# Patient Record
Sex: Female | Born: 1955 | Race: White | Hispanic: No | Smoking: Former smoker
Health system: Southern US, Community
[De-identification: ages and names within clinical notes are randomized; demographics above are authoritative.]

## PROBLEM LIST (undated history)

## (undated) DIAGNOSIS — I739 Peripheral vascular disease, unspecified: Secondary | ICD-10-CM

## (undated) DIAGNOSIS — T7840XA Allergy, unspecified, initial encounter: Secondary | ICD-10-CM

## (undated) DIAGNOSIS — L732 Hidradenitis suppurativa: Secondary | ICD-10-CM

## (undated) DIAGNOSIS — E785 Hyperlipidemia, unspecified: Secondary | ICD-10-CM

## (undated) DIAGNOSIS — Z9842 Cataract extraction status, left eye: Secondary | ICD-10-CM

## (undated) DIAGNOSIS — Z860100 Personal history of colon polyps, unspecified: Secondary | ICD-10-CM

## (undated) DIAGNOSIS — E039 Hypothyroidism, unspecified: Secondary | ICD-10-CM

## (undated) DIAGNOSIS — E119 Type 2 diabetes mellitus without complications: Secondary | ICD-10-CM

## (undated) DIAGNOSIS — K5790 Diverticulosis of intestine, part unspecified, without perforation or abscess without bleeding: Secondary | ICD-10-CM

## (undated) DIAGNOSIS — G4700 Insomnia, unspecified: Secondary | ICD-10-CM

## (undated) DIAGNOSIS — E669 Obesity, unspecified: Secondary | ICD-10-CM

## (undated) DIAGNOSIS — N952 Postmenopausal atrophic vaginitis: Secondary | ICD-10-CM

## (undated) DIAGNOSIS — E079 Disorder of thyroid, unspecified: Secondary | ICD-10-CM

## (undated) DIAGNOSIS — J329 Chronic sinusitis, unspecified: Secondary | ICD-10-CM

## (undated) DIAGNOSIS — R519 Headache, unspecified: Secondary | ICD-10-CM

## (undated) DIAGNOSIS — R42 Dizziness and giddiness: Secondary | ICD-10-CM

## (undated) DIAGNOSIS — J309 Allergic rhinitis, unspecified: Secondary | ICD-10-CM

## (undated) DIAGNOSIS — D509 Iron deficiency anemia, unspecified: Secondary | ICD-10-CM

## (undated) DIAGNOSIS — J449 Chronic obstructive pulmonary disease, unspecified: Secondary | ICD-10-CM

## (undated) DIAGNOSIS — I700 Atherosclerosis of aorta: Secondary | ICD-10-CM

## (undated) DIAGNOSIS — E559 Vitamin D deficiency, unspecified: Secondary | ICD-10-CM

## (undated) DIAGNOSIS — I5030 Unspecified diastolic (congestive) heart failure: Secondary | ICD-10-CM

## (undated) DIAGNOSIS — F329 Major depressive disorder, single episode, unspecified: Secondary | ICD-10-CM

## (undated) DIAGNOSIS — R112 Nausea with vomiting, unspecified: Secondary | ICD-10-CM

## (undated) DIAGNOSIS — R0600 Dyspnea, unspecified: Secondary | ICD-10-CM

## (undated) DIAGNOSIS — M1712 Unilateral primary osteoarthritis, left knee: Secondary | ICD-10-CM

## (undated) DIAGNOSIS — J45909 Unspecified asthma, uncomplicated: Secondary | ICD-10-CM

## (undated) DIAGNOSIS — Z9889 Other specified postprocedural states: Secondary | ICD-10-CM

## (undated) DIAGNOSIS — G629 Polyneuropathy, unspecified: Secondary | ICD-10-CM

## (undated) DIAGNOSIS — Z9841 Cataract extraction status, right eye: Secondary | ICD-10-CM

## (undated) DIAGNOSIS — M549 Dorsalgia, unspecified: Secondary | ICD-10-CM

## (undated) DIAGNOSIS — D241 Benign neoplasm of right breast: Secondary | ICD-10-CM

## (undated) DIAGNOSIS — Z9981 Dependence on supplemental oxygen: Secondary | ICD-10-CM

## (undated) DIAGNOSIS — J189 Pneumonia, unspecified organism: Secondary | ICD-10-CM

## (undated) DIAGNOSIS — I10 Essential (primary) hypertension: Secondary | ICD-10-CM

## (undated) DIAGNOSIS — R609 Edema, unspecified: Secondary | ICD-10-CM

## (undated) DIAGNOSIS — I509 Heart failure, unspecified: Secondary | ICD-10-CM

## (undated) DIAGNOSIS — G8929 Other chronic pain: Secondary | ICD-10-CM

## (undated) DIAGNOSIS — G4730 Sleep apnea, unspecified: Secondary | ICD-10-CM

## (undated) DIAGNOSIS — I452 Bifascicular block: Secondary | ICD-10-CM

## (undated) DIAGNOSIS — I499 Cardiac arrhythmia, unspecified: Secondary | ICD-10-CM

## (undated) DIAGNOSIS — G4733 Obstructive sleep apnea (adult) (pediatric): Secondary | ICD-10-CM

## (undated) DIAGNOSIS — F32A Depression, unspecified: Secondary | ICD-10-CM

## (undated) DIAGNOSIS — K589 Irritable bowel syndrome without diarrhea: Secondary | ICD-10-CM

## (undated) DIAGNOSIS — K219 Gastro-esophageal reflux disease without esophagitis: Secondary | ICD-10-CM

## (undated) DIAGNOSIS — G709 Myoneural disorder, unspecified: Secondary | ICD-10-CM

## (undated) HISTORY — PX: CHOLECYSTECTOMY: SHX55

## (undated) HISTORY — PX: HEEL SPUR EXCISION: SHX1733

## (undated) HISTORY — PX: TONSILLECTOMY: SUR1361

## (undated) HISTORY — PX: KNEE SURGERY: SHX244

## (undated) HISTORY — PX: ABDOMINAL HYSTERECTOMY: SHX81

## (undated) HISTORY — PX: AXILLARY HIDRADENITIS EXCISION: SUR522

## (undated) HISTORY — DX: Heart failure, unspecified: I50.9

---

## 2004-07-27 ENCOUNTER — Ambulatory Visit: Payer: Self-pay

## 2005-03-30 ENCOUNTER — Ambulatory Visit: Payer: Self-pay | Admitting: Family Medicine

## 2005-12-20 ENCOUNTER — Ambulatory Visit: Payer: Self-pay

## 2007-05-14 ENCOUNTER — Ambulatory Visit: Payer: Self-pay

## 2008-09-16 ENCOUNTER — Ambulatory Visit: Payer: Self-pay

## 2010-04-19 ENCOUNTER — Ambulatory Visit: Payer: Self-pay

## 2010-05-03 ENCOUNTER — Emergency Department: Payer: Self-pay | Admitting: Emergency Medicine

## 2011-08-06 ENCOUNTER — Emergency Department: Payer: Self-pay | Admitting: *Deleted

## 2011-08-30 ENCOUNTER — Encounter: Payer: Self-pay | Admitting: Family Medicine

## 2011-09-18 ENCOUNTER — Encounter: Payer: Self-pay | Admitting: Family Medicine

## 2011-10-05 ENCOUNTER — Ambulatory Visit: Payer: Self-pay | Admitting: Family Medicine

## 2012-04-15 ENCOUNTER — Ambulatory Visit: Payer: Self-pay | Admitting: Unknown Physician Specialty

## 2012-08-15 ENCOUNTER — Ambulatory Visit: Payer: Self-pay | Admitting: Family Medicine

## 2012-09-16 LAB — CBC WITH DIFFERENTIAL/PLATELET
Basophil #: 0.1 10*3/uL (ref 0.0–0.1)
Basophil %: 0.6 %
Eosinophil #: 0.2 10*3/uL (ref 0.0–0.7)
Eosinophil %: 1.2 %
HGB: 12.6 g/dL (ref 12.0–16.0)
MCH: 25.5 pg — ABNORMAL LOW (ref 26.0–34.0)
MCHC: 32.1 g/dL (ref 32.0–36.0)
MCV: 80 fL (ref 80–100)
Monocyte #: 0.6 x10 3/mm (ref 0.2–0.9)
Monocyte %: 4.2 %
Neutrophil #: 12 10*3/uL — ABNORMAL HIGH (ref 1.4–6.5)
Neutrophil %: 83.1 %
Platelet: 293 10*3/uL (ref 150–440)
RBC: 4.95 10*6/uL (ref 3.80–5.20)
RDW: 16.5 % — ABNORMAL HIGH (ref 11.5–14.5)
WBC: 14.5 10*3/uL — ABNORMAL HIGH (ref 3.6–11.0)

## 2012-09-16 LAB — BASIC METABOLIC PANEL
Creatinine: 1.07 mg/dL (ref 0.60–1.30)
EGFR (African American): >60
EGFR (Non-African Amer.): 58 — ABNORMAL LOW
Sodium: 136 mmol/L (ref 136–145)

## 2012-09-17 ENCOUNTER — Inpatient Hospital Stay: Payer: Self-pay | Admitting: Internal Medicine

## 2012-09-18 LAB — CBC WITH DIFFERENTIAL/PLATELET
Basophil %: 0.5 %
Eosinophil %: 1.2 %
HGB: 12.6 g/dL (ref 12.0–16.0)
Lymphocyte %: 15.9 %
MCHC: 33.4 g/dL (ref 32.0–36.0)
Neutrophil %: 77.5 %
RBC: 4.78 10*6/uL (ref 3.80–5.20)
WBC: 11.4 10*3/uL — ABNORMAL HIGH (ref 3.6–11.0)

## 2012-09-18 LAB — BASIC METABOLIC PANEL
Anion Gap: 11 (ref 7–16)
Calcium, Total: 9.2 mg/dL (ref 8.5–10.1)
Chloride: 92 mmol/L — ABNORMAL LOW (ref 98–107)
Co2: 29 mmol/L (ref 21–32)
EGFR (Non-African Amer.): >60
Glucose: 322 mg/dL — ABNORMAL HIGH (ref 65–99)
Osmolality: 279 (ref 275–301)
Sodium: 132 mmol/L — ABNORMAL LOW (ref 136–145)

## 2012-09-18 LAB — LIPID PANEL
Cholesterol: 107 mg/dL (ref 0–200)
HDL Cholesterol: 29 mg/dL — ABNORMAL LOW (ref 40–60)
Ldl Cholesterol, Calc: 22 mg/dL (ref 0–100)
Triglycerides: 282 mg/dL — ABNORMAL HIGH (ref 0–200)
VLDL Cholesterol, Calc: 56 mg/dL — ABNORMAL HIGH (ref 5–40)

## 2012-09-21 LAB — WOUND CULTURE

## 2013-01-03 ENCOUNTER — Ambulatory Visit: Payer: Self-pay | Admitting: Family Medicine

## 2013-12-09 ENCOUNTER — Ambulatory Visit: Payer: Self-pay | Admitting: Family Medicine

## 2013-12-12 ENCOUNTER — Encounter: Payer: Self-pay | Admitting: Surgery

## 2013-12-16 LAB — WOUND AEROBIC CULTURE

## 2013-12-18 ENCOUNTER — Encounter: Payer: Self-pay | Admitting: Surgery

## 2014-01-17 ENCOUNTER — Encounter: Payer: Self-pay | Admitting: Surgery

## 2014-01-27 ENCOUNTER — Ambulatory Visit: Payer: Self-pay | Admitting: Family Medicine

## 2014-01-28 ENCOUNTER — Encounter: Payer: Self-pay | Admitting: Surgery

## 2014-02-17 ENCOUNTER — Encounter: Payer: Self-pay | Admitting: Surgery

## 2014-05-05 ENCOUNTER — Emergency Department: Payer: Self-pay | Admitting: Student

## 2014-07-10 NOTE — Discharge Summary (Signed)
PATIENT NAME:  [name] [name], [name] [name] MR#:  [id number] DATE OF BIRTH:  [birth year]  DATE OF ADMISSION:  09/17/2012 DATE OF DISCHARGE:  09/19/2012  ADDENDUM   The patient's wound cultures from her left lower abdomen just came back as Streptococcus agalactia group B. The patient has (Dictation Anomaly) rare white blood cells as well as (Dictation Anomaly) rare gram-positive cocci which are group B Streptococcus. The patient would benefit from antibiotic therapy if she still has continuous infection and drainage with Keflex or Augmentin at this point, and if she still continues to have discomfort, I would recommend to initiate those antibiotics for a few more days until drainage subsides. However, the patient's vital signs remain stable, and her white blood cell count on 09/18/2012 was 11.4. It is recommended to follow the patient's white blood cell count and make decisions about reinitiation of antibiotic therapy if needed.   ____________________________ Theodoro Grist, MD rv:gb D: 09/19/2012 15:52:51 ET T: 09/19/2012 23:12:13 ET JOB#: [id number]  cc: Theodoro Grist, MD, <Dictator> Sarah "Sallie" Posey Pronto, MD [place] MD ELECTRONICALLY SIGNED 10/02/2012 6:52

## 2014-07-10 NOTE — Discharge Summary (Signed)
PATIENT NAME:  [name] [name], [name] [name] MR#:  [id number] DATE OF BIRTH:  1955-03-28  DATE OF ADMISSION:  09/17/2012 DATE OF DISCHARGE:  09/19/2012  ADMITTING DIAGNOSES: Cellulitis and abscess.   DISCHARGE DIAGNOSES: 1.  Abdominal wall cellulitis and abscess status post incision and drainage in the past and completed antibiotic therapy, improving. 2.  History of hydradenitis suppurativa.  3.  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  4.  Hypertension, depression, chronic obstructive pulmonary disease and asthma, as well as obesity.   DISCHARGE CONDITION: Stable.   DISCHARGE MEDICATIONS: The patient is to resume her outpatient medications which are: 1.  Naprosyn 500 mg p.o. twice daily as needed.  2.  Cymbalta 20 mg p.o. once daily.  3.  Meclizine 25 mg 3 times daily as needed.  4.  Hydrochlorothiazide/lisinopril 12.5/10 mg once daily.  5.  Klor-Con 10 mEq p.o. twice daily.  6.  Humulin R 9 units twice daily before meals.  7.  Doxepin 25 mg p.o. at bedtime.  8.  Proventil HFA free 2 puffs every 4 hours as needed.  9.  Trazodone 50 mg p.o. 1 to 2 tablets at bedtime as needed.  10.  Omeprazole 40 mg p.o. daily.  11.  Advair Diskus 250/50 one puff twice daily.  12.  Furosemide 40 mg p.o. twice daily.  13.  Levothyroxine 125 mcg 2 tablets once daily.  14.  Albuterol 2.5 mg inhalation solution 3 mL every 4 hours as needed.  15.  Lipitor 20 mg p.o. once daily.  16.  Rhinocort nasal spray 2 sprays once daily.  17.  Acetaminophen/oxycodone 325/5 mg 1 tablet every 6 hours as needed.   DISCHARGE WOUND CARE:  The patient is to keep the bandage dry and applied to the wound.  She is to use only dry bandage on the wound and change it as needed at least once a day.   DISCHARGE DIET: Low salt, low fat, low cholesterol, carbohydrate-controlled. Also, the patient was advised to have low calorie diet and lose weight if possible. Regular consistency.   ACTIVITY LIMITATIONS: As tolerated.    DISCHARGE FOLLOWUP: Appointment with Dr. Rochel Brome in 1 week after discharge, Dr. Denton Lank in 2 days after discharge.   CONSULTANTS: Care management, Dr. Pat Patrick.   RADIOLOGIC STUDIES: None.   HISTORY: The patient is a 59 year old Caucasian female with past medical history significant for history of hydradenitis who presented to the hospital on 09/17/2012 with complaints of abscess of abdominal wall. Please refer to admitting physician's note, by Dr. Tressia Miners.   HOSPITAL COURSE:  On arrival to the hospital, it was felt the patient had cellulitis with draining abscess in suprapubic area and surgical consultation was requested.  The patient was initiated on broad-spectrum antibiotic therapy, as well as blood cultures were taken. The patient was also evaluated by Dr. Pat Patrick who followed her along. He felt that the patient had abdominal wall abscess; however, it appeared to have drained spontaneously.  She underwent drainage procedure in his office in the past year. Abscess drained 2 days ago with burst of bloody and foul-smelling fluid. Since Dr. Rochel Brome was not available at that time, Dr. Pat Patrick saw the patient in consultation. He felt that she has hydradenitis suppurativa with multiple abscesses in both axillary areas and just completed antibiotic therapy with doxycycline.  On exam, he felt that the cellulitis was shrinking in the area, in the left lower quadrant and groin.  Bloody drainage was on underwear, but he felt that there  was no evidence of un-drained infection. He felt that there was no surgical indication at this time and if questions arise he recommended to re-consult Dr. Rochel Brome since he has been involved in the patient's care previously.  The patient was managed on antibiotic therapy for a few days. She had wound cultures taken from left lower abdomen which showed light group of Staphylococcus agalactia, group B. However, since the patient was draining spontaneously and completed  antibiotic course, we did not feel that she needs to continue antibiotic therapy at this point.  She is, however, to follow up with her primary physician, Dr. Posey Pronto, as well as Dr. Rochel Brome in the next few days after discharge or if any other problems arise.   On day of discharge, the patient's vital signs are stable with temperature 97.9, pulse was 80s to 90s, respiratory rate was 20, blood pressure 071 to 219 systolic and 75O to 83G diastolic and O2 sats were 91 to 94% on room air at rest.   TIME SPENT: 40 minutes. ____________________________ Theodoro Grist, MD rv:sb D: 09/19/2012 15:49:07 ET T: 09/19/2012 16:35:51 ET JOB#: [id number]  cc: Theodoro Grist, MD, <Dictator> J. Rochel Brome, MD Sarah "Sallie" Posey Pronto, MD [place] MD ELECTRONICALLY SIGNED 09/26/2012 11:38

## 2014-07-10 NOTE — H&P (Signed)
PATIENT NAME:  [name] [name], DRZEWIECKI MR#:  [id number] DATE OF BIRTH:  1956-01-15  DATE OF ADMISSION:  09/16/2012  PRIMARY CARE PHYSICIAN: Dr. Denton Lank.   REFERRING PHYSICIAN: Ferman Hamming.  CHIEF COMPLAINT: Increased pain, swelling, redness in the abdomen.   HISTORY OF PRESENT ILLNESS: Ms. Klenke is a 59 year old, morbidly obese, white female with history of diabetes mellitus insulin dependent, hypertension, hyperlipidemia. Presented to the Emergency Department with increased redness and swelling on the lower part of the abdomen. This started about a week back and started to drain. The patient had multiple episodes of cellulitis and abscess at various parts of the body which were incised and drained in the past. The patient received 1 dose of vancomycin in the Emergency Department. The patient is also found to have elevated white blood cell count of 14,000. Even though afebrile, the patient was tachycardic.   PAST MEDICAL HISTORY:  1. Hypothyroidism.  2. Gastroesophageal reflux disease.  3. Obstructive sleep apnea.  4. Insomnia.  5. Hypertension.  6. Diabetes mellitus, insulin dependent.  7. Seasonal allergies.   PAST SURGICAL HISTORY: Hysterectomy.   ALLERGIES: CODEINE.   HOME MEDICATIONS:  1. Trazodone 50 mg 1 to 2 tablets at bedtime.  2. Rhinocort 2 sprays nasal once a day.  3. Proventil 2 puffs every 4 hours as needed.  4. Omeprazole 40 mg daily.  5. Naprosyn 500 mg 2 times a day.  6.    7. Lipitor 20 mg once a day.  8. Levothyroxine 250 mcg once a day.  9. Klor-Con 10 mEq 2 times a day.  10. Hydrochlorothiazide/lisinopril 12.5/10 mg once a day.  11. Humulin R 90 units subcutaneous 2 times a day.  12. Lasix 40 mg 2 times a day.  13. Doxepin 25 mg once a day.  14. Cymbalta 20 mg once a day.  15. Advair Diskus 1 puff 2 times a day.   SOCIAL HISTORY: No history of smoking, drinking alcohol or using illicit drugs. Disabled.   FAMILY HISTORY: Mother died of liver cancer.  Father died at a massive heart attack. One of his sisters died of heart attack.   REVIEW OF SYSTEMS:  CONSTITUTIONAL: Denies having any generalized weakness.  EYES: No change in vision.  ENT: No change in hearing. No tinnitus.  RESPIRATORY: No cough or shortness of breath.  CARDIOVASCULAR: No chest pain, palpitations. Uses Lasix for lower extremity swelling.   GENITOURINARY: No dysuria or hematochezia.  SKIN: Has multiple skin abscesses.  MUSCULOSKELETAL: Has chronic pain in multiple joints.  NEUROLOGIC: No weakness or numbness in any part of the body.  PSYCHIATRIC: Has depression.   PHYSICAL EXAMINATION:  GENERAL: This is a morbidly obese female lying down in the bed, not in distress.  VITAL SIGNS: Temperature 98, pulse 103, blood pressure 117/54, respiratory rate of 18, oxygen saturations 95% on room air.  HEENT: Head normocephalic, atraumatic. There is no scleral icterus. Conjunctivae normal. Pupils equal and react to light. Extraocular movements are intact. Mucous membranes moist. No pharyngeal erythema.  NECK: Supple. No lymphadenopathy. No JVD. No carotid bruit. No thyromegaly.  CHEST: Has no focal tenderness.  LUNGS: Bilaterally clear to auscultation.  HEART: S1, S2, regular, tachycardia. No pedal edema. Pulses 2+.  ABDOMEN: Obese. Bowel sounds present. In the left lower quadrant under the pannus, has a large area of cellulitis with open area of abscess. No rebound, guarding or rebound tenderness.  MUSCULOSKELETAL: Good range of motion in all of the extremities.  NEUROLOGIC: The patient is alert, oriented  to place, person and time. Cranial nerves II through XII intact. Motor 5/5 in upper and lower extremities.   LABORATORY DATA: CMP: Glucose 356. The rest of all of the values are within normal limits. CBC: WBC of 14.5, hemoglobin 12.6. The rest of all of the values are within normal limits.   ASSESSMENT AND PLAN: Ms. Heffelfinger is a 59 year old female who comes to the Emergency  Department with cellulitis and abscess of the abdominal wall.  1. Cellulitis and abscess:  open area, started the patient on vancomycin. If the patient continues to have persistence of the induration, would strongly consider getting an ultrasound or CT of the abdomen to rule out underlying abscess.   2. Diabetes mellitus, poorly controlled: This could be from the infection. The patient states usually blood sugars are well controlled. Continue the current dose. Will also obtain hemoglobin A1c.  3. Hypertension: Currently well controlled. Continue home medications of lisinopril/hydrochlorothiazide.  4. Morbid obesity with a body mass index of 50: Discussed with the patient. The patient is planning to go for gastric bypass surgery.  5. Keep the patient on deep vein thrombosis prophylaxis with Lovenox.   TIME SPENT: 45 minutes.   ____________________________ Monica Becton, MD pv:gb D: 09/17/2012 01:05:38 ET T: 09/17/2012 01:45:09 ET JOB#: [id number]  cc: Monica Becton, MD, <Dictator> Sarah "Sallie" Posey Pronto, MD Monica Becton MD ELECTRONICALLY SIGNED 10/01/2012 0:37

## 2014-07-10 NOTE — Consult Note (Signed)
PATIENT NAME:  [name] [name] MR#:  [id number] DATE OF BIRTH:  [birth year]  DATE OF CONSULTATION:  09/17/2012  REFERRING PHYSICIAN:   CONSULTING PHYSICIAN:  Micheline Maze, MD  PRIMARY CARE PHYSICIAN:  Zannie Kehr, MD  ADMITTING PHYSICIAN:  Prime doc.   CHIEF COMPLAINT:  Abscess of abdominal wall.   BRIEF HISTORY:  [name] [name] is a 59 year old woman admitted to the hospital with an abdominal wall cellulitis and a spontaneously draining abscess. She had been symptomatic for approximately 48 hours at the time of admission. She has had multiple previous abscesses primarily on her neck and axilla. She underwent an excision and drainage procedure by Dr. Rochel Brome, her primary surgeon, within the last 6 months with an abscess on her neck and she has had a previous ventral hernia repair in his hands. Dr. Tamala Julian was unavailable today due to his surgical schedule and I was asked to see the patient in his stead.   She has been treated with a recent course of doxycycline in an effort to suppress her ongoing infections. With her morbid obesity and insulin-dependent diabetes, she is at significant risk for the development of these problems. She carries also the diagnosis of hidradenitis suppurativa. She was admitted with an elevated white blood cell count. No imaging studies have been performed. The surgical service was consulted to consider possible incision and drainage.   PAST MEDICAL HISTORY:  Significant for severe chronic obstructive lung disease, morbid obesity, insulin-dependent diabetes, obstructive sleep apnea and gastroesophageal reflux disease.   MEDICATIONS:  Well outlined in her admission note.   SOCIAL HISTORY:  She is not a cigarette smoker and currently does not use alcohol. She is being evaluated for potential gastric bypass surgery for morbid obesity.   PHYSICAL EXAMINATION: GENERAL: She is an alert pleasant woman lying in bed in no distress. She did say she recently had a hot  flash and is a little diaphoretic, but does not appear to be in any distress.  VITAL SIGNS: Her blood pressure is 108/69 and heart rate is 95 and regular. She is afebrile.  HEENT: Open draining sore on the posterior portion of her left neck. There is some cellulitis surrounding it at the site of her previous incision and drainage per Dr. Tamala Julian. She has no pupillary abnormalities. No scleral icterus. Her neck is otherwise supple without any obvious tenderness and she has a midline trachea.  CHEST: Clear with no adventitious sounds and normal pulmonary excursion.  CARDIAC: No murmurs or gallops. She seems to be in normal sinus rhythm.  ABDOMEN: Soft, nontender with no organomegaly. No rebound and no guarding. She has a large draining wound on her left lower quadrant with some surrounding cellulitis. She does appear to have drained spontaneously and does not appear to have any further undrained pus.  EXTREMITIES: There was some mild edema. Full range of motion.  PSYCHIATRIC: Normal orientation and normal affect.   IMPRESSION:  This woman appears to have a significant skin infection problem characterized by multiple skin infections including the one on her neck and abdomen and her recent history of hidradenitis suppurativa. She does not have anything at the present time that she needs to have drained. I agree with antibiotic therapy and followup as an outpatient. We would recommend she continue her followup with Dr. Tamala Julian since she has an established relationship with his practice. Should she require any further assistance in the hospital, I would also recommend consulting Dr. Tamala Julian.    ____________________________ Deidre Ala  Buck Mam III, MD rle:si D: 09/17/2012 15:13:00 ET T: 09/17/2012 15:58:33 ET JOB#: [id number]  cc: Rodena Goldmann III, MD, <Dictator> Rodena Goldmann MD ELECTRONICALLY SIGNED 09/20/2012 8:25

## 2014-07-12 ENCOUNTER — Emergency Department: Admit: 2014-07-12 | Disposition: A | Discharge status: Home / Self Care | Payer: Self-pay | Admitting: Student

## 2014-07-12 LAB — COMPREHENSIVE METABOLIC PANEL
ALBUMIN: 3.6 g/dL
ANION GAP: 13 (ref 7–16)
Alkaline Phosphatase: 109 U/L
BUN: 18 mg/dL
Bilirubin,Total: 0.7 mg/dL
CHLORIDE: 94 mmol/L — AB
Calcium, Total: 8.9 mg/dL
Co2: 26 mmol/L
Creatinine: 0.95 mg/dL
EGFR (Non-African Amer.): >60
GLUCOSE: 287 mg/dL — AB
Potassium: 4.4 mmol/L
SGOT(AST): 21 U/L
SGPT (ALT): 15 U/L
Sodium: 133 mmol/L — ABNORMAL LOW
Total Protein: 7 g/dL

## 2014-07-12 LAB — CBC
HCT: 41.8 % (ref 35.0–47.0)
HGB: 13.5 g/dL (ref 12.0–16.0)
MCH: 26.1 pg (ref 26.0–34.0)
MCHC: 32.2 g/dL (ref 32.0–36.0)
MCV: 81 fL (ref 80–100)
Platelet: 272 10*3/uL (ref 150–440)
RBC: 5.15 10*6/uL (ref 3.80–5.20)
RDW: 15.5 % — AB (ref 11.5–14.5)
WBC: 13.9 10*3/uL — ABNORMAL HIGH (ref 3.6–11.0)

## 2014-09-15 ENCOUNTER — Encounter: Payer: Self-pay | Admitting: Urgent Care

## 2014-09-15 ENCOUNTER — Emergency Department: Payer: Medicare Other

## 2014-09-15 ENCOUNTER — Emergency Department
Admission: EM | Admit: 2014-09-15 | Discharge: 2014-09-16 | Disposition: A | Discharge status: Home / Self Care | Payer: Medicare Other | Attending: Emergency Medicine | Admitting: Emergency Medicine

## 2014-09-15 DIAGNOSIS — Y93E1 Activity, personal bathing and showering: Secondary | ICD-10-CM | POA: Insufficient documentation

## 2014-09-15 DIAGNOSIS — S99922A Unspecified injury of left foot, initial encounter: Secondary | ICD-10-CM | POA: Diagnosis present

## 2014-09-15 DIAGNOSIS — Y9289 Other specified places as the place of occurrence of the external cause: Secondary | ICD-10-CM | POA: Insufficient documentation

## 2014-09-15 DIAGNOSIS — W182XXA Fall in (into) shower or empty bathtub, initial encounter: Secondary | ICD-10-CM | POA: Diagnosis not present

## 2014-09-15 DIAGNOSIS — L03116 Cellulitis of left lower limb: Secondary | ICD-10-CM | POA: Insufficient documentation

## 2014-09-15 DIAGNOSIS — Y998 Other external cause status: Secondary | ICD-10-CM | POA: Insufficient documentation

## 2014-09-15 DIAGNOSIS — I10 Essential (primary) hypertension: Secondary | ICD-10-CM | POA: Diagnosis not present

## 2014-09-15 DIAGNOSIS — Z23 Encounter for immunization: Secondary | ICD-10-CM | POA: Insufficient documentation

## 2014-09-15 DIAGNOSIS — S90411A Abrasion, right great toe, initial encounter: Secondary | ICD-10-CM | POA: Insufficient documentation

## 2014-09-15 DIAGNOSIS — Z87891 Personal history of nicotine dependence: Secondary | ICD-10-CM | POA: Diagnosis not present

## 2014-09-15 DIAGNOSIS — E119 Type 2 diabetes mellitus without complications: Secondary | ICD-10-CM | POA: Insufficient documentation

## 2014-09-15 HISTORY — DX: Edema, unspecified: R60.9

## 2014-09-15 HISTORY — DX: Vitamin D deficiency, unspecified: E55.9

## 2014-09-15 HISTORY — DX: Gastro-esophageal reflux disease without esophagitis: K21.9

## 2014-09-15 HISTORY — DX: Unilateral primary osteoarthritis, left knee: M17.12

## 2014-09-15 HISTORY — DX: Allergic rhinitis, unspecified: J30.9

## 2014-09-15 HISTORY — DX: Personal history of colonic polyps: Z86.010

## 2014-09-15 HISTORY — DX: Sleep apnea, unspecified: G47.30

## 2014-09-15 HISTORY — DX: Obesity, unspecified: E66.9

## 2014-09-15 HISTORY — DX: Depression, unspecified: F32.A

## 2014-09-15 HISTORY — DX: Personal history of colon polyps, unspecified: Z86.0100

## 2014-09-15 HISTORY — DX: Irritable bowel syndrome, unspecified: K58.9

## 2014-09-15 HISTORY — DX: Essential (primary) hypertension: I10

## 2014-09-15 HISTORY — DX: Chronic obstructive pulmonary disease, unspecified: J44.9

## 2014-09-15 HISTORY — DX: Chronic sinusitis, unspecified: J32.9

## 2014-09-15 HISTORY — DX: Insomnia, unspecified: G47.00

## 2014-09-15 HISTORY — DX: Dizziness and giddiness: R42

## 2014-09-15 HISTORY — DX: Disorder of thyroid, unspecified: E07.9

## 2014-09-15 HISTORY — DX: Major depressive disorder, single episode, unspecified: F32.9

## 2014-09-15 HISTORY — DX: Postmenopausal atrophic vaginitis: N95.2

## 2014-09-15 HISTORY — DX: Hidradenitis suppurativa: L73.2

## 2014-09-15 HISTORY — DX: Allergy, unspecified, initial encounter: T78.40XA

## 2014-09-15 HISTORY — DX: Type 2 diabetes mellitus without complications: E11.9

## 2014-09-15 LAB — BASIC METABOLIC PANEL
ANION GAP: 13 (ref 5–15)
BUN: 15 mg/dL (ref 6–20)
CALCIUM: 9.1 mg/dL (ref 8.9–10.3)
CHLORIDE: 96 mmol/L — AB (ref 101–111)
CO2: 28 mmol/L (ref 22–32)
CREATININE: 0.99 mg/dL (ref 0.44–1.00)
GFR calc Af Amer: >60 mL/min (ref >60)
GFR calc non Af Amer: >60 mL/min (ref >60)
Glucose, Bld: 171 mg/dL — ABNORMAL HIGH (ref 65–99)
Potassium: 3.7 mmol/L (ref 3.5–5.1)
Sodium: 137 mmol/L (ref 135–145)

## 2014-09-15 LAB — CBC
HCT: 41.2 % (ref 35.0–47.0)
HEMOGLOBIN: 13.1 g/dL (ref 12.0–16.0)
MCH: 25.7 pg — AB (ref 26.0–34.0)
MCHC: 31.7 g/dL — ABNORMAL LOW (ref 32.0–36.0)
MCV: 81.1 fL (ref 80.0–100.0)
Platelets: 300 10*3/uL (ref 150–440)
RBC: 5.09 MIL/uL (ref 3.80–5.20)
RDW: 15.3 % — ABNORMAL HIGH (ref 11.5–14.5)
WBC: 13 10*3/uL — ABNORMAL HIGH (ref 3.6–11.0)

## 2014-09-15 MED ORDER — SULFAMETHOXAZOLE-TRIMETHOPRIM 800-160 MG PO TABS: 2.0000 | ORAL_TABLET | Freq: Two times a day (BID) | ORAL | Status: DC

## 2014-09-15 MED ORDER — CEPHALEXIN 500 MG PO CAPS: 500.0000 mg | ORAL_CAPSULE | Freq: Four times a day (QID) | ORAL | Status: AC

## 2014-09-15 MED ORDER — TETANUS-DIPHTHERIA TOXOIDS TD 5-2 LFU IM INJ
INJECTION | INTRAMUSCULAR | Status: AC
  Administered 2014-09-15: 0.5 mL via INTRAMUSCULAR
  Filled 2014-09-15: qty 0.5

## 2014-09-15 MED ORDER — TETANUS-DIPHTHERIA TOXOIDS TD 5-2 LFU IM INJ
0.5000 mL | INJECTION | Freq: Once | INTRAMUSCULAR | Status: AC
  Administered 2014-09-15: 0.5 mL via INTRAMUSCULAR

## 2014-09-15 MED ORDER — SODIUM CHLORIDE 0.9 % IV BOLUS (SEPSIS)
500.0000 mL | Freq: Once | INTRAVENOUS | Status: AC
  Administered 2014-09-15: 500 mL via INTRAVENOUS

## 2014-09-15 MED ORDER — VANCOMYCIN HCL 10 G IV SOLR
2000.0000 mg | Freq: Once | INTRAVENOUS | Status: AC
  Administered 2014-09-15: 2000 mg via INTRAVENOUS
  Filled 2014-09-15: qty 2000

## 2014-09-15 NOTE — ED Notes (Signed)
Pharmacy called for antibiotic.

## 2014-09-15 NOTE — Discharge Instructions (Signed)

## 2014-09-15 NOTE — ED Notes (Signed)
Schaevitz,MD consulted. MD made aware of presenting complaints and triage assessment. MD with VORB for CBC, BMP, and plain films of foot. Orders to be entered and carried by this RN.

## 2014-09-15 NOTE — ED Notes (Signed)
Pt presents to ED with scrape to left great toe from falling out of bathtub last week. Wound does not appear to be healing. Pt states she now has left ankle swelling with no known injury for the past couple of days. [name]

## 2014-09-15 NOTE — ED Notes (Signed)
Patient presents with reports of a fall. Reports that she fell in the shower "over a week ago" - continues with non-specific headache. Patient also cut LEFT great toe on shower stopper - patient has T2DM - area slow to heal and now has ankle swelling and is draining yellow purulent drainage.

## 2014-09-15 NOTE — ED Provider Notes (Addendum)
[facility] Emergency Department Provider Note  ____________________________________________  Time seen: Approximately 950 PM  I have reviewed the triage vital signs and the nursing notes.   HISTORY  Chief Complaint Foot Injury    HPI [name] [name] is a 59 y.o. female with a history of diabetes and hypertension who presents today with left-sided foot and ankle swelling. She says that one week ago she slipped and fell on the shower scraping her left toe and since she has had drainage of pus and now with increased swelling and pain to the foot and ankle. She denies any fevers. She said that she did hit her head when she fell and had a lump on the back of her head but the lump in her headache have now decreased. She is not claiming of any focal numbness or weakness. Was on a course of doxycycline for 6-8 weeks 3 weeks ago but is no longer taking the medication. Does not know when her last tetanus shot was.   Past Medical History  Diagnosis Date  . Diabetes mellitus without complication   . Thyroid disease   . COPD (chronic obstructive pulmonary disease)     There are no active problems to display for this patient.   Past Surgical History  Procedure Laterality Date  . Abdominal hysterectomy    . Cesarean section      x 2  . Cholecystectomy    . Tonsillectomy      No current outpatient prescriptions on file.  Allergies Codeine  No family history on file.  Social History History  Substance Use Topics  . Smoking status: Former Research scientist (life sciences)  . Smokeless tobacco: Not on file  . Alcohol Use: No    Review of Systems Constitutional: No fever/chills Eyes: No visual changes. ENT: No sore throat. Cardiovascular: Denies chest pain. Respiratory: Denies shortness of breath. Gastrointestinal: No abdominal pain.  No nausea, no vomiting.  No diarrhea.  No constipation. Genitourinary: Negative for dysuria. Musculoskeletal: Negative for back pain. Skin:  Negative for rash. Neurological: Negative for headaches, focal weakness or numbness.  10-point ROS otherwise negative.  ____________________________________________   PHYSICAL EXAM:  VITAL SIGNS: ED Triage Vitals  Enc Vitals Group     BP 09/15/14 2049 104/49 mmHg     Pulse Rate 09/15/14 2049 94     Resp 09/15/14 2049 18     Temp 09/15/14 2049 98 F (36.7 C)     Temp Source 09/15/14 2049 Oral     SpO2 09/15/14 2049 92 %     Weight 09/15/14 2049 280 lb (127.007 kg)     Height 09/15/14 2049 5\' 5"  (1.651 m)     Head Cir --      Peak Flow --      Pain Score 09/15/14 2050 6     Pain Loc --      Pain Edu? --      Excl. in [place]? --     Constitutional: Alert and oriented. Well appearing and in no acute distress. Eyes: Conjunctivae are normal. PERRL. EOMI. Head: Atraumatic. Nose: No congestion/rhinnorhea. Mouth/Throat: Mucous membranes are moist.  Oropharynx non-erythematous. Neck: No stridor.   Cardiovascular: Normal rate, regular rhythm. Grossly normal heart sounds.  Good peripheral circulation. Dorsalis pedis pulses present and equal bilaterally. Respiratory: Normal respiratory effort.  No retractions. Lungs CTAB. Gastrointestinal: Soft and nontender. No distention. No abdominal bruits. No CVA tenderness. Musculoskeletal: No lower extremity tenderness nor edema.  No joint effusions. Neurologic:  Normal speech and  language. No gross focal neurologic deficits are appreciated. Speech is normal. No gait instability. Sensate to the bilateral lower extremities and feet. Skin:  Skin is warm, dry with a 1 x 3 cm superficial abrasion to the right lateral and dorsum of the great toe. There is no drainage. However, there is a half a centimeter wheal of erythema around the abrasion. There is mild edema to the bilateral lower extremities with slightly more to the left foot and ankle extending just above the ankle.  No weeping pus to the foot ankle or leg. No induration. No rash noted.  No entry  wound or foreign body visualized or palpated. There is no swelling in the area of the foreign body seen on x-ray or induration pus. I feel this is likely external, possibly on the x-ray plate.  Psychiatric: Mood and affect are normal. Speech and behavior are normal.  ____________________________________________   LABS (all labs ordered are listed, but only abnormal results are displayed)  Labs Reviewed  CBC - Abnormal; Notable for the following:    WBC 13.0 (*)    MCH 25.7 (*)    MCHC 31.7 (*)    RDW 15.3 (*)    All other components within normal limits  BASIC METABOLIC PANEL - Abnormal; Notable for the following:    Chloride 96 (*)    Glucose, Bld 171 (*)    All other components within normal limits  CULTURE, BLOOD (SINGLE)   ____________________________________________  EKG   ____________________________________________  RADIOLOGY  X-rays of the foot with diffuse edema. Linear 7 mm metallic foreign body along the plantar surface of the second digit. I personally reviewed the images. ____________________________________________   PROCEDURES   ____________________________________________   INITIAL IMPRESSION / ASSESSMENT AND PLAN / ED COURSE  Pertinent labs & imaging results that were available during my care of the patient were reviewed by me and considered in my medical decision making (see chart for details).  ----------------------------------------- 11:23 PM on 09/15/2014 -----------------------------------------  To give a loading dose of IV vancomycin. We'll discharge with by mouth antibiotics. Pressures improving with fluids. Signed out to Dr. Dahlia Client will follow patient's pressure of the next several hours as she receives her vancomycin and disposition. Do not see evidence of any foreign body in the tissue. Was not part of the acute injury last week. ____________________________________________   FINAL CLINICAL IMPRESSION(S) / ED DIAGNOSES  Acute left  foot and left great toe cellulitis. Initial visit.    Orbie Pyo, MD 09/15/14 2325  Labs at baseline. Patient with chronic leukocytosis.  Orbie Pyo, MD 09/15/14 [phone]

## 2014-09-16 ENCOUNTER — Encounter: Payer: Self-pay | Admitting: Emergency Medicine

## 2014-09-16 DIAGNOSIS — L03116 Cellulitis of left lower limb: Secondary | ICD-10-CM | POA: Diagnosis not present

## 2014-09-16 MED ORDER — BACITRACIN ZINC 500 UNIT/GM EX OINT
TOPICAL_OINTMENT | CUTANEOUS | Status: AC
  Administered 2014-09-16: 1
  Filled 2014-09-16: qty 0.9

## 2014-09-16 MED ORDER — FLUCONAZOLE 150 MG PO TABS: 150.0000 mg | ORAL_TABLET | Freq: Every day | ORAL | Status: DC

## 2014-09-16 NOTE — ED Notes (Signed)

## 2014-09-16 NOTE — ED Notes (Signed)
Patient requesting something else for antibiotics. MD in room to speak with patient.

## 2014-09-16 NOTE — ED Provider Notes (Signed)
-----------------------------------------   1:37 AM on 09/16/2014 -----------------------------------------  Assuming care from Dr. Clearnce Hasten.  In short, [name] [name] is a 59 y.o. female with a chief complaint of Foot Injury .  Refer to the original H&P for additional details.  The current plan of care is to reassess the patient after she receives her fluids and antibiotics.   The patient completed her fluids and her antibiotics without any difficulty. She'll be discharged home to follow-up with her primary care physician.  Loney Hering, MD 09/16/14 [phone]

## 2014-09-20 LAB — CULTURE, BLOOD (SINGLE): Culture: NO GROWTH

## 2014-12-11 ENCOUNTER — Ambulatory Visit: Payer: Medicare Other | Admitting: Surgery

## 2014-12-13 ENCOUNTER — Emergency Department
Admission: EM | Admit: 2014-12-13 | Discharge: 2014-12-13 | Disposition: A | Discharge status: Home / Self Care | Payer: Medicare Other | Attending: Emergency Medicine | Admitting: Emergency Medicine

## 2014-12-13 DIAGNOSIS — Z87891 Personal history of nicotine dependence: Secondary | ICD-10-CM | POA: Diagnosis not present

## 2014-12-13 DIAGNOSIS — Z7951 Long term (current) use of inhaled steroids: Secondary | ICD-10-CM | POA: Insufficient documentation

## 2014-12-13 DIAGNOSIS — Z794 Long term (current) use of insulin: Secondary | ICD-10-CM | POA: Insufficient documentation

## 2014-12-13 DIAGNOSIS — Z792 Long term (current) use of antibiotics: Secondary | ICD-10-CM | POA: Diagnosis not present

## 2014-12-13 DIAGNOSIS — Z79899 Other long term (current) drug therapy: Secondary | ICD-10-CM | POA: Diagnosis not present

## 2014-12-13 DIAGNOSIS — L0291 Cutaneous abscess, unspecified: Secondary | ICD-10-CM

## 2014-12-13 DIAGNOSIS — E119 Type 2 diabetes mellitus without complications: Secondary | ICD-10-CM | POA: Insufficient documentation

## 2014-12-13 DIAGNOSIS — L0211 Cutaneous abscess of neck: Secondary | ICD-10-CM | POA: Diagnosis not present

## 2014-12-13 DIAGNOSIS — I10 Essential (primary) hypertension: Secondary | ICD-10-CM | POA: Diagnosis not present

## 2014-12-13 DIAGNOSIS — L02214 Cutaneous abscess of groin: Secondary | ICD-10-CM | POA: Diagnosis not present

## 2014-12-13 LAB — GLUCOSE, CAPILLARY: Glucose-Capillary: 218 mg/dL — ABNORMAL HIGH (ref 65–99)

## 2014-12-13 MED ORDER — VANCOMYCIN HCL IN DEXTROSE 1-5 GM/200ML-% IV SOLN
INTRAVENOUS | Status: AC
  Administered 2014-12-13: 1 g
  Filled 2014-12-13: qty 200

## 2014-12-13 MED ORDER — VANCOMYCIN HCL 10 G IV SOLR: 1.0000 g | Freq: Once | INTRAVENOUS | Status: DC

## 2014-12-13 MED ORDER — LIDOCAINE-EPINEPHRINE (PF) 1 %-1:200000 IJ SOLN
INTRAMUSCULAR | Status: AC
  Filled 2014-12-13: qty 30

## 2014-12-13 MED ORDER — CEPHALEXIN 500 MG PO CAPS: 500.0000 mg | ORAL_CAPSULE | Freq: Four times a day (QID) | ORAL | Status: AC

## 2014-12-13 MED ORDER — LIDOCAINE-EPINEPHRINE 2 %-1:100000 IJ SOLN: 30.0000 mL | Freq: Once | INTRAMUSCULAR | Status: DC

## 2014-12-13 MED ORDER — DOXYCYCLINE HYCLATE 100 MG PO TBEC: 100.0000 mg | DELAYED_RELEASE_TABLET | Freq: Two times a day (BID) | ORAL | Status: AC

## 2014-12-13 NOTE — ED Notes (Signed)
Pt says she's had abscesses to bilateral axilla for about 5 years, sores present now; for 2 years she's had sores on her neck; about 10 days she noticed an area to her left groin that is now open and draining; pt says she has been diagnosed with hidradenitis suppurativa but does not take medications for this; afebrile

## 2014-12-13 NOTE — ED Provider Notes (Signed)
Time Seen: Approximately ----------------------------------------- 10:19 PM on 12/13/2014 -----------------------------------------    I have reviewed the triage notes  Chief Complaint: Abscess   History of Present Illness: [name] [name] is a 59 y.o. female who states a long history of frequent abscesses. Patient has been in to see her primary physician and has been advised to follow up in the wound clinic. Her most recent lesion seemed to be in the left groin and also in the back of her neck. Patient's currently not on any oral antibiotics. She denies any fever at home and is not aware if her blood sugars been elevated. She states the lesion in her left groin has had some active drainage. She denies any abdominal or chest discomfort.   Past Medical History  Diagnosis Date  . Diabetes mellitus without complication   . Thyroid disease   . COPD (chronic obstructive pulmonary disease)   . Vertigo   . Edema   . Allergic rhinitis   . Degenerative joint disease of knee, left   . Hidradenitis   . IBS (irritable bowel syndrome)   . Depression   . Vitamin D deficiency   . Insomnia   . Sleep apnea   . History of colonic polyps   . Sinusitis, chronic   . Hypertension   . Obesity   . Vaginitis, atrophic   . GERD (gastroesophageal reflux disease)   . Allergy     There are no active problems to display for this patient.   Past Surgical History  Procedure Laterality Date  . Abdominal hysterectomy    . Cesarean section      x 2  . Cholecystectomy    . Tonsillectomy      Past Surgical History  Procedure Laterality Date  . Abdominal hysterectomy    . Cesarean section      x 2  . Cholecystectomy    . Tonsillectomy      Current Outpatient Rx  Name  Route  Sig  Dispense  Refill  . albuterol (PROVENTIL HFA;VENTOLIN HFA) 108 (90 BASE) MCG/ACT inhaler   Inhalation   Inhale 2 puffs into the lungs every 4 (four) hours as needed for wheezing or shortness of breath.          [name] albuterol (PROVENTIL) (2.5 MG/3ML) 0.083% nebulizer solution   Nebulization   Take 2.5 mg by nebulization every 4 (four) hours as needed for wheezing or shortness of breath.         [name] atorvastatin (LIPITOR) 20 MG tablet   Oral   Take 20 mg by mouth daily at 6 PM.         . cephALEXin (KEFLEX) 500 MG capsule   Oral   Take 1 capsule (500 mg total) by mouth 4 (four) times daily.   40 capsule   0   . cholestyramine (QUESTRAN) 4 GM/DOSE powder   Oral   Take by mouth 2 (two) times daily with a meal. 1-2 scoops in glass of water/juice by mouth twice a day.         [name] doxepin (SINEQUAN) 50 MG capsule   Oral   Take 50 mg by mouth at bedtime.         [name] doxycycline (DORYX) 100 MG EC tablet   Oral   Take 1 tablet (100 mg total) by mouth 2 (two) times daily.   14 tablet   0   . DOXYCYCLINE HYCLATE PO   Oral   Take 100 mg by mouth 2 (  two) times daily.         . DULoxetine (CYMBALTA) 20 MG capsule   Oral   Take 20 mg by mouth daily.         . fluconazole (DIFLUCAN) 150 MG tablet   Oral   Take 1 tablet (150 mg total) by mouth daily.   1 tablet   0   . fluocinonide (LIDEX) 0.05 % external solution   Topical   Apply 1 application topically 2 (two) times daily. Apply small amount to scalp twice a day         . Fluticasone-Salmeterol (ADVAIR) 250-50 MCG/DOSE AEPB   Inhalation   Inhale 1 puff into the lungs 2 (two) times daily.         . furosemide (LASIX) 40 MG tablet   Oral   Take 40 mg by mouth 2 (two) times daily.         [name] gabapentin (NEURONTIN) 100 MG capsule   Oral   Take 100 mg by mouth at bedtime.         . hydrocortisone 2.5 % cream   Topical   Apply 1 application topically 2 (two) times daily.         . insulin regular human CONCENTRATED (HUMULIN R) 500 UNIT/ML injection   Subcutaneous   Inject into the skin 2 (two) times daily before a meal. 115 units subcutaneously before breakfast and before supper (measure on insulin syringe 23  units=115 units of concentrated insulin)         . ketoconazole (NIZORAL) 2 % shampoo   Topical   Apply 1 application topically 2 (two) times a week.         . levothyroxine (SYNTHROID, LEVOTHROID) 125 MCG tablet   Oral   Take 125 mcg by mouth 2 (two) times daily.         [name] linagliptin (TRADJENTA) 5 MG TABS tablet   Oral   Take 5 mg by mouth daily.         [name] lisinopril-hydrochlorothiazide (PRINZIDE,ZESTORETIC) 10-12.5 MG per tablet   Oral   Take 1 tablet by mouth daily.         [name] loratadine (CLARITIN) 10 MG tablet   Oral   Take 10 mg by mouth daily.         . meclizine (ANTIVERT) 25 MG tablet   Oral   Take 25 mg by mouth 3 (three) times daily as needed for dizziness.         . mometasone (NASONEX) 50 MCG/ACT nasal spray   Nasal   Place 2 sprays into the nose daily.         . montelukast (SINGULAIR) 10 MG tablet   Oral   Take 10 mg by mouth at bedtime.         [name] olopatadine (PATANOL) 0.1 % ophthalmic solution   Both Eyes   Place 1 drop into both eyes 2 (two) times daily.         [name] omeprazole (PRILOSEC) 40 MG capsule   Oral   Take 40 mg by mouth daily.         . potassium chloride (K-DUR,KLOR-CON) 10 MEQ tablet   Oral   Take 10 mEq by mouth 2 (two) times daily. 1 by mouth twice a day for low potassium WITH YOUR FLUID PILL         . sulfacetamide (BLEPH-10) 10 % ophthalmic solution      1 drop every 3 (three) hours while awake.         [name]  sulfamethoxazole-trimethoprim (BACTRIM DS) 800-160 MG per tablet   Oral   Take 2 tablets by mouth 2 (two) times daily.   40 tablet   0   . traZODone (DESYREL) 50 MG tablet   Oral   Take 50 mg by mouth at bedtime. 1-2 by mouth nightly at bedtime to help sleep           Allergies:  Codeine  Family History: No family history on file.  Social History: Social History  Substance Use Topics  . Smoking status: Former Research scientist (life sciences)  . Smokeless tobacco: Not on file  . Alcohol Use: No     Review of  Systems:   10 point review of systems was performed and was otherwise negative:  Constitutional: No fever No significant anterior neck pain or trouble with speech or swallowing Cardiac: No chest pain Respiratory: No shortness of breath, wheezing, or stridor Abdomen: No abdominal pain, no vomiting, No diarrhea Endocrine: No weight loss, No night sweats Extremities: No peripheral edema, cyanosis Skin: No rashes, easy bruising Neurologic: No focal weakness, trouble with speech or swollowing Urologic: No dysuria, Hematuria, or urinary frequency   Physical Exam:  ED Triage Vitals  Enc Vitals Group     BP 12/13/14 1912 120/61 mmHg     Pulse Rate 12/13/14 1912 91     Resp 12/13/14 1912 20     Temp 12/13/14 1912 98.1 F (36.7 C)     Temp Source 12/13/14 1912 Oral     SpO2 12/13/14 1912 95 %     Weight 12/13/14 1912 280 lb (127.007 kg)     Height 12/13/14 1912 5\' 4"  (1.626 m)     Head Cir --      Peak Flow --      Pain Score 12/13/14 1913 6     Pain Loc --      Pain Edu? --      Excl. in [place]? --     General: Awake , Alert , and Oriented times 3; GCS 15 Head: Normal cephalic , atraumatic Eyes: Pupils equal , round, reactive to light Nose/Throat: No nasal drainage, patent upper airway without erythema or exudate.  Neck: Patient has 2 lesions on the back of her neck connected by what appears to be a tunnel lesion. There is no fluctuance with only a small amount of erythema Supple, Full range of motion, No anterior adenopathy or palpable thyroid masses. No significant cervical adenopathy Lungs: Clear to ascultation without wheezes , rhonchi, or rales Heart: Regular rate, regular rhythm without murmurs , gallops , or rubs Abdomen morbidly obese: Soft, non tender without rebound, guarding , or rigidity; bowel sounds positive and symmetric in all 4 quadrants. No organomegaly .        Extremities: Patient has a draining lesion in the left groin area without any erythema or exudate no  significant active bleeding at this time. Neurologic: normal ambulation, Motor symmetric without deficits, sensory intact Skin: No other skin lesions outside of those mentioned above Labs:   All laboratory work was reviewed including any pertinent negatives or positives listed below:  Labs Reviewed  GLUCOSE, CAPILLARY - Abnormal; Notable for the following:    Glucose-Capillary 218 (*)    All other components within normal limits   I felt given her clinical presentation other laboratory work was not necessary    ED Course:  The patient's stay here was uneventful and she was given a dose of IV vancomycin will be discharged on doxycycline. Patient also  be discharged on Keflex and she's been advised to do as directed as her primary physician and stated and follow up with the wound clinic. The lesions do not appear to be amenable to incision and drainage at this time. She's been advised to return here if she has a fever or significantly elevated blood sugar. 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 Patient was advised to follow up with her primary care physician or other specialized physicians involved and in their current assessment.            Daymon Larsen, MD 12/13/14 2224

## 2014-12-13 NOTE — Discharge Instructions (Signed)
Abscess °An abscess is an infected area that contains a collection of pus and debris. It can occur in almost any part of the body. An abscess is also known as a furuncle or boil. °CAUSES  °An abscess occurs when tissue gets infected. This can occur from blockage of oil or sweat glands, infection of hair follicles, or a minor injury to the skin. As the body tries to fight the infection, pus collects in the area and creates pressure under the skin. This pressure causes pain. People with weakened immune systems have difficulty fighting infections and get certain abscesses more often.  °SYMPTOMS °Usually an abscess develops on the skin and becomes a painful mass that is red, warm, and tender. If the abscess forms under the skin, you may feel a moveable soft area under the skin. Some abscesses break open (rupture) on their own, but most will continue to get worse without care. The infection can spread deeper into the body and eventually into the bloodstream, causing you to feel ill.  °DIAGNOSIS  °Your caregiver will take your medical history and perform a physical exam. A sample of fluid may also be taken from the abscess to determine what is causing your infection. °TREATMENT  °Your caregiver may prescribe antibiotic medicines to fight the infection. However, taking antibiotics alone usually does not cure an abscess. Your caregiver may need to make a small cut (incision) in the abscess to drain the pus. In some cases, gauze is packed into the abscess to reduce pain and to continue draining the area. °HOME CARE INSTRUCTIONS  °· Only take over-the-counter or prescription medicines for pain, discomfort, or fever as directed by your caregiver. °· If you were prescribed antibiotics, take them as directed. Finish them even if you start to feel better. °· If gauze is used, follow your caregiver's directions for changing the gauze. °· To avoid spreading the infection: °¨ Keep your draining abscess covered with a  bandage. °¨ Wash your hands well. °¨ Do not share personal care items, towels, or whirlpools with others. °¨ Avoid skin contact with others. °· Keep your skin and clothes clean around the abscess. °· Keep all follow-up appointments as directed by your caregiver. °SEEK MEDICAL CARE IF:  °· You have increased pain, swelling, redness, fluid drainage, or bleeding. °· You have muscle aches, chills, or a general ill feeling. °· You have a fever. °MAKE SURE YOU:  °· Understand these instructions. °· Will watch your condition. °· Will get help right away if you are not doing well or get worse. °Document Released: 12/14/2004 Document Revised: 09/05/2011 Document Reviewed: 05/19/2011 °[employer]® Patient Information ©2015 [employer]. This information is not intended to replace advice given to you by your health care provider. Make sure you discuss any questions you have with your health care provider. ° °Community-Associated MRSA °CA-MRSA stands for community-associated methicillin-resistant Staphylococcus aureus. MRSA is a type of bacteria that is resistant to some common antibiotics. It can cause infections in the skin and many other places in the body. Staphylococcus aureus, often called "staph," is a bacteria that normally lives on the skin or in the nose. Staph on the surface of the skin or in the nose does not cause problems. However, if the staph enters the body through a cut, wound, or break in the skin, an infection can happen. °Up until recently, infections with the MRSA type of staph mainly occurred in hospitals and other health care settings. There are now increasing problems with MRSA infections in the community   as well. Infections with MRSA may be very serious or even life threatening. °CA-MRSA is becoming more common. It is known to spread in crowded settings, in jails and prisons, and in situations where there is close skin-to-skin contact, such as during sporting events or in locker rooms. MRSA can be spread  through shared items, such as children's toys, razors, towels, or sports equipment.  °CAUSES °All staph, including MRSA, are normally harmless unless they enter the body through a scratch, cut, or wound, such as with surgery. All staph, including MRSA, can be spread from person-to-person by touching contaminated objects or through direct contact. °· MRSA now causes illness in people who have not been in hospitals or other health care facilities. Cases of MRSA diseases in the community have been associated with: °¨ Recent antibiotic use. °¨ Sharing contaminated towels or clothes. °¨ Having active skin diseases. °¨ Participating in contact sports. °¨ Living in crowded settings. °¨ Intravenous (IV) drug use. °· Community-associated MRSA infections are usually skin infections, but may cause other severe illnesses. °· Staph bacteria are one of the most common causes of skin infection. However, they are also a common cause of pneumonia, bone or joint infections, and bloodstream infections. °DIAGNOSIS °Diagnosis of MRSA is done by cultures of fluid samples that may come from: °· Swabs taken from cuts or wounds in infected areas. °· Nasal swabs. °· Saliva or deep cough specimens from the lungs (sputum). °· Urine. °· Blood. °Many people are "colonized" with MRSA but have no signs of infection. This means that people carry the MRSA germ on their skin or in their nose and may never develop MRSA infection.  °TREATMENT  °Treatment varies and is based on how serious, how deep, or how extensive the infection is. For example: °· Some skin infections, such as a small boil or abscess, may be treated by draining yellowish-white fluid (pus) from the site of the infection. °· Deeper or more widespread soft tissue infections are usually treated with surgery to drain pus and with antibiotic medicine given by vein or by mouth. This may be recommended even if you are pregnant. °· Serious infections may require a hospital stay. °If  antibiotics are given, they may be needed for several weeks. °PREVENTION °Because many people are colonized with staph, including MRSA, preventing the spread of the bacteria from person-to-person is most important. The best way to prevent the spread of bacteria and other germs is through proper hand washing or by using alcohol-based hand disinfectants. The following are other ways to help prevent MRSA infection within community settings.  °· Wash your hands frequently with soap and water for at least 15 seconds. Otherwise, use alcohol-based hand disinfectants when soap and water is not available. °· Make sure people who live with you wash their hands often, too. °· Do not share personal items. For example, avoid sharing razors and other personal hygiene items, towels, clothing, and athletic equipment. °· Wash and dry your clothes and bedding at the warmest temperatures recommended on the labels. °· Keep wounds covered. Pus from infected sores may contain MRSA and other bacteria. Keep cuts and abrasions clean and covered with germ-free (sterile), dry bandages until they are healed. °· If you have a wound that appears infected, ask your caregiver if a culture for MRSA and other bacteria should be done. °· If you are breastfeeding, talk to your caregiver about MRSA. You may be asked to temporarily stop breastfeeding. °HOME CARE INSTRUCTIONS  °· Take your antibiotics as directed.   Finish them even if you start to feel better. °· Avoid close contact with those around you as much as possible. Do not use towels, razors, toothbrushes, bedding, or other items that will be used by others. °· To fight the infection, follow your caregiver's instructions for wound care. Wash your hands before and after changing your bandages. °· If you have an intravascular device, such as a catheter, make sure you know how to care for it. °· Be sure to tell any health care providers that you have MRSA so they are aware of your infection. °SEEK  IMMEDIATE MEDICAL CARE IF: °· The infection appears to be getting worse. Signs include: °¨ Increased warmth, redness, or tenderness around the wound site. °¨ A red line that extends from the infection site. °¨ A dark color in the area around the infection. °¨ Wound drainage that is tan, yellow, or green. °¨ A bad smell coming from the wound. °· You feel sick to your stomach (nauseous) and throw up (vomit) or cannot keep medicine down. °· You have a fever. °· Your baby is older than 3 months with a rectal temperature of 102°F (38.9°C) or higher. °· Your baby is 3 months old or younger with a rectal temperature of 100.4°F (38°C) or higher. °· You have difficulty breathing. °MAKE SURE YOU:  °· Understand these instructions. °· Will watch your condition. °· Will get help right away if you are not doing well or get worse. °Document Released: 06/09/2005 Document Revised: 07/21/2013 Document Reviewed: 06/09/2010 °[employer]® Patient Information ©2015 [employer]. This information is not intended to replace advice given to you by your health care provider. Make sure you discuss any questions you have with your health care provider. ° °

## 2014-12-16 ENCOUNTER — Other Ambulatory Visit
Admission: RE | Admit: 2014-12-16 | Discharge: 2014-12-16 | Disposition: A | Discharge status: Home / Self Care | Payer: Medicare Other | Source: Ambulatory Visit | Attending: Surgery | Admitting: Surgery

## 2014-12-16 ENCOUNTER — Encounter: Payer: Medicare Other | Attending: Surgery | Admitting: Surgery

## 2014-12-16 DIAGNOSIS — Z87891 Personal history of nicotine dependence: Secondary | ICD-10-CM | POA: Diagnosis not present

## 2014-12-16 DIAGNOSIS — E669 Obesity, unspecified: Secondary | ICD-10-CM | POA: Diagnosis not present

## 2014-12-16 DIAGNOSIS — G629 Polyneuropathy, unspecified: Secondary | ICD-10-CM | POA: Insufficient documentation

## 2014-12-16 DIAGNOSIS — L989 Disorder of the skin and subcutaneous tissue, unspecified: Secondary | ICD-10-CM | POA: Diagnosis present

## 2014-12-16 DIAGNOSIS — J45909 Unspecified asthma, uncomplicated: Secondary | ICD-10-CM | POA: Insufficient documentation

## 2014-12-16 DIAGNOSIS — L732 Hidradenitis suppurativa: Secondary | ICD-10-CM | POA: Insufficient documentation

## 2014-12-16 DIAGNOSIS — G4730 Sleep apnea, unspecified: Secondary | ICD-10-CM | POA: Insufficient documentation

## 2014-12-16 DIAGNOSIS — E11622 Type 2 diabetes mellitus with other skin ulcer: Secondary | ICD-10-CM | POA: Diagnosis not present

## 2014-12-16 DIAGNOSIS — I10 Essential (primary) hypertension: Secondary | ICD-10-CM | POA: Insufficient documentation

## 2014-12-16 NOTE — Progress Notes (Signed)
[name] [name], [name] [name] ([id number]) Visit Report for 12/16/2014 Abuse/Suicide Risk Screen Details Patient Name: [name] [name], [name] [name]. Date of Service: 12/16/2014 2:30 PM Medical Record Number: [id number] Patient Account Number: [id number] Date of Birth/Sex: 1956-01-26 (59 y.o. Female) Treating RN: Afful, RN, BSN, Velva Harman Primary Care Physician: Denton Lank Other Clinician: Referring Physician: Denton Lank Treating Physician/Extender: BURNS III, WALTER Weeks in Treatment: 0 Abuse/Suicide Risk Screen Items Answer ABUSE/SUICIDE RISK SCREEN: Has anyone close to you tried to hurt or harm you recentlyo No Do you feel uncomfortable with anyone in your familyo No Has anyone forced you do things that you didnot want to doo No Do you have any thoughts of harming yourselfo No Patient displays signs or symptoms of abuse and/or neglect. No Electronic Signature(s) Signed: 12/16/2014 2:26:03 PM By: Regan Lemming BSN, RN Entered By: Regan Lemming on 12/16/2014 14:26:02 [name] [name] ([id number]) -------------------------------------------------------------------------------- Activities of Daily Living Details Patient Name: [name] [name], [name] [name]. Date of Service: 12/16/2014 2:30 PM Medical Record Number: [id number] Patient Account Number: [id number] Date of Birth/Sex: 11-29-1955 (59 y.o. Female) Treating RN: Baruch Gouty, RN, BSN, Velva Harman Primary Care Physician: Denton Lank Other Clinician: Referring Physician: Denton Lank Treating Physician/Extender: BURNS III, Charlean Sanfilippo in Treatment: 0 Activities of Daily Living Items Answer Activities of Daily Living (Please select one for each item) Drive Automobile Completely Able Take Medications Completely Able Use Telephone Completely Able Care for Appearance Completely Able Use Toilet Completely Able Bath / Shower Completely Able Dress Self Completely Able Feed Self Completely Able Walk Completely Able Get In / Out Bed Completely Able Housework Completely Able Prepare Meals  Completely [place] for Self Completely Able Electronic Signature(s) Signed: 12/16/2014 2:26:45 PM By: Regan Lemming BSN, RN Entered By: Regan Lemming on 12/16/2014 14:26:45 [name] [name] ([id number]) -------------------------------------------------------------------------------- Education Assessment Details Patient Name: [name] [name], [name] [name]. Date of Service: 12/16/2014 2:30 PM Medical Record Number: [id number] Patient Account Number: [id number] Date of Birth/Sex: 12/24/1955 (59 y.o. Female) Treating RN: Afful, RN, BSN, Velva Harman Primary Care Physician: Denton Lank Other Clinician: Referring Physician: Denton Lank Treating Physician/Extender: BURNS III, Charlean Sanfilippo in Treatment: 0 Primary Learner Assessed: Patient Learning Preferences/Education Level/Primary Language Learning Preference: Explanation Highest Education Level: Grade School Preferred Language: English Cognitive Barrier Assessment/Beliefs Language Barrier: No Physical Barrier Assessment Impaired Vision: No Impaired Hearing: No Decreased Hand dexterity: No Knowledge/Comprehension Assessment Knowledge Level: Medium Comprehension Level: Medium Ability to understand written Medium instructions: Ability to understand verbal Medium instructions: Motivation Assessment Anxiety Level: Calm Cooperation: Cooperative Education Importance: Acknowledges Need Interest in Health Problems: Asks Questions Perception: Coherent Willingness to Engage in Self- Medium Management Activities: Readiness to Engage in Self- Medium Management Activities: Electronic Signature(s) Signed: 12/16/2014 2:24:16 PM By: Regan Lemming BSN, RN Entered By: Regan Lemming on 12/16/2014 14:24:16 [name] [name] ([id number]) -------------------------------------------------------------------------------- Fall Risk Assessment Details Patient Name: [name] [name]. Date of Service: 12/16/2014 2:30 PM Medical Record Number:  [id number] Patient Account Number: [id number] Date of Birth/Sex: [birth year] (59 y.o. Female) Treating RN: Afful, RN, BSN, Velva Harman Primary Care Physician: Denton Lank Other Clinician: Referring Physician: Denton Lank Treating Physician/Extender: BURNS III, Charlean Sanfilippo in Treatment: 0 Fall Risk Assessment Items FALL RISK ASSESSMENT: History of falling - immediate or within 3 months 0 No Secondary diagnosis 0 No Ambulatory aid None/bed rest/wheelchair/nurse 0 Yes Crutches/cane/walker 0 No Furniture 0 No IV Access/Saline Lock 0 No Gait/Training Normal/bed rest/immobile 0 Yes Weak 0 No Impaired 0 No Mental Status Oriented to own ability 0 Yes Electronic Signature(s) Signed: 12/16/2014 2:24:47 PM By:  Afful, [employer], RN Entered By: Regan Lemming on 12/16/2014 14:24:47 [name] [name] ([id number]) -------------------------------------------------------------------------------- Foot Assessment Details Patient Name: [name] [name], [name] [name]. Date of Service: 12/16/2014 2:30 PM Medical Record Number: [id number] Patient Account Number: [id number] Date of Birth/Sex: [birth year] (59 y.o. Female) Treating RN: Afful, RN, BSN, Velva Harman Primary Care Physician: Denton Lank Other Clinician: Referring Physician: Denton Lank Treating Physician/Extender: BURNS III, Charlean Sanfilippo in Treatment: 0 Foot Assessment Items Site Locations + = Sensation present, - = Sensation absent, C = Callus, U = Ulcer R = Redness, W = Warmth, M = Maceration, PU = Pre-ulcerative lesion F = Fissure, S = Swelling, D = Dryness Assessment Right: Left: Other Deformity: No No Prior Foot Ulcer: No No Prior Amputation: No No Charcot Joint: No No Ambulatory Status: Ambulatory Without Help Gait: Steady Electronic Signature(s) Signed: 12/16/2014 2:24:58 PM By: Regan Lemming BSN, RN Entered By: Regan Lemming on 12/16/2014 14:24:58 [name] [name]  ([id number]) -------------------------------------------------------------------------------- Nutrition Risk Assessment Details Patient Name: [name] [name], [name] [name]. Date of Service: 12/16/2014 2:30 PM Medical Record Number: [id number] Patient Account Number: [id number] Date of Birth/Sex: [birth year] (59 y.o. Female) Treating RN: Afful, RN, BSN, [place] Primary Care Physician: Denton Lank Other Clinician: Referring Physician: Denton Lank Treating Physician/Extender: BURNS III, WALTER Weeks in Treatment: 0 Height (in): 63 Weight (lbs): 287.4 Body Mass Index (BMI): 50.9 Nutrition Risk Assessment Items NUTRITION RISK SCREEN: I have an illness or condition that made me change the kind and/or 0 No amount of food I eat I eat fewer than two meals per day 0 No I eat few fruits and vegetables, or milk products 0 No I have three or more drinks of beer, liquor or wine almost every day 0 No I have tooth or mouth problems that make it hard for me to eat 0 No I don't always have enough money to buy the food I need 0 No I eat alone most of the time 0 No I take three or more different prescribed or over-the-counter drugs a 0 No day Without wanting to, I have lost or gained 10 pounds in the last six 0 No months I am not always physically able to shop, cook and/or feed myself 0 No Nutrition Protocols Good Risk Protocol 0 No interventions needed Moderate Risk Protocol Electronic Signature(s) Signed: 12/16/2014 2:26:12 PM By: Regan Lemming BSN, RN Entered By: Regan Lemming on 12/16/2014 14:26:12

## 2014-12-16 NOTE — Progress Notes (Addendum)
[name] [name] ([id number]) Visit Report for 12/16/2014 Allergy List Details Patient Name: [name] [name], [name] [name]. Date of Service: 12/16/2014 2:30 PM Medical Record Number: [id number] Patient Account Number: [id number] Date of Birth/Sex: Nov 13, 1955 (59 y.o. Female) Treating RN: [name] Gouty, RN, BSN, [name] [name] Primary Care Physician: [name] [name] Other Clinician: Referring Physician: Denton [name] Treating Physician/Extender: [name] [name], [name] [name] Weeks in Treatment: 0 Allergies Active Allergies codeine Reaction: itch Severity: Severe Allergy Notes Electronic Signature(s) Signed: 12/16/2014 2:22:51 PM By: [name] [name] BSN, RN Entered By: [name] [name] on 12/16/2014 14:22:51 [name] [name] ([id number]) -------------------------------------------------------------------------------- Arrival Information Details Patient Name: [name] [name], [name] [name]. Date of Service: 12/16/2014 2:30 PM Medical Record Number: [id number] Patient Account Number: [id number] Date of Birth/Sex: Dec 12, 1955 (59 y.o. Female) Treating RN: Afful, RN, BSN, [name] [name] Primary Care Physician: [name] [name] Other Clinician: Referring Physician: Denton [name] Treating Physician/Extender: [name] [name], [name] [name] in Treatment: 0 Visit Information Patient Arrived: Ambulatory Arrival Time: 14:28 Accompanied By: self Transfer Assistance: None Patient Identification Verified: Yes Secondary Verification Process Yes Completed: Patient Requires Transmission-Based No Precautions: Patient Has Alerts: No History Since Last Visit Any new allergies or adverse reactions: No Had a fall or experienced change in activities of daily living that may affect risk of falls: No Signs or symptoms of abuse/neglect since last visito No Hospitalized since last visit: No Has Dressing in Place as Prescribed: Yes Electronic Signature(s) Signed: 12/16/2014 2:28:48 PM By: [name] [name] BSN, RN Entered By: [name] [name] on 12/16/2014 14:28:48 [name] [name]  ([id number]) -------------------------------------------------------------------------------- Clinic Level of Care Assessment Details Patient Name: [name] [name], [name] [name]. Date of Service: 12/16/2014 2:30 PM Medical Record Number: [id number] Patient Account Number: [id number] Date of Birth/Sex: 03/03/1956 (59 y.o. Female) Treating RN: Afful, RN, BSN, [name] [name] Primary Care Physician: [name] [name] Other Clinician: Referring Physician: Denton [name] Treating Physician/Extender: [name] [name], [name] [name] in Treatment: 0 Clinic Level of Care Assessment Items TOOL 2 Quantity Score []  - Use when only an EandM is performed on the INITIAL visit 0 ASSESSMENTS - Nursing Assessment / Reassessment X - General Physical Exam (combine w/ comprehensive assessment (listed just 1 20 below) when performed on new pt. evals) X - Comprehensive Assessment (HX, ROS, Risk Assessments, Wounds Hx, etc.) 1 25 ASSESSMENTS - Wound and Skin Assessment / Reassessment []  - Simple Wound Assessment / Reassessment - one wound 0 X - Complex Wound Assessment / Reassessment - multiple wounds 3 5 []  - Dermatologic / Skin Assessment (not related to wound area) 0 ASSESSMENTS - Ostomy and/or Continence Assessment and Care []  - Incontinence Assessment and Management 0 []  - Ostomy Care Assessment and Management (repouching, etc.) 0 PROCESS - Coordination of Care X - Simple Patient / Family Education for ongoing care 1 15 []  - Complex (extensive) Patient / Family Education for ongoing care 0 []  - Staff obtains Programmer, systems, Records, Test Results / Process Orders 0 []  - Staff telephones HHA, Nursing Homes / Clarify orders / etc 0 []  - Routine Transfer to another Facility (non-emergent condition) 0 []  - Routine Hospital Admission (non-emergent condition) 0 []  - New Admissions / Biomedical engineer / Ordering NPWT, Apligraf, etc. 0 []  - Emergency Hospital Admission (emergent condition) 0 []  - Simple Discharge Coordination 0 [name] [name], BERK.  ([id number]) []  - Complex (extensive) Discharge Coordination 0 PROCESS - Special Needs []  - Pediatric / Minor Patient Management 0 []  - Isolation Patient Management 0 []  - Hearing / Language / Visual special needs 0 []  - Assessment of Community assistance (transportation, D/C planning, etc.) 0 []  - Additional assistance /  Altered mentation 0 []  - Support Surface(s) Assessment (bed, cushion, seat, etc.) 0 INTERVENTIONS - Wound Cleansing / Measurement X - Wound Imaging (photographs - any number of wounds) 1 5 []  - Wound Tracing (instead of photographs) 0 []  - Simple Wound Measurement - one wound 0 X - Complex Wound Measurement - multiple wounds 3 5 []  - Simple Wound Cleansing - one wound 0 X - Complex Wound Cleansing - multiple wounds 3 5 INTERVENTIONS - Wound Dressings X - Small Wound Dressing one or multiple wounds 3 10 []  - Medium Wound Dressing one or multiple wounds 0 []  - Large Wound Dressing one or multiple wounds 0 []  - Application of Medications - injection 0 INTERVENTIONS - Miscellaneous []  - External ear exam 0 []  - Specimen Collection (cultures, biopsies, blood, body fluids, etc.) 0 []  - Specimen(s) / Culture(s) sent or taken to Lab for analysis 0 []  - Patient Transfer (multiple staff / Civil Service fast streamer / Similar devices) 0 []  - Simple Staple / Suture removal (25 or less) 0 []  - Complex Staple / Suture removal (26 or more) 0 [name] [name], [name] M. ([id number]) []  - Hypo / Hyperglycemic Management (close monitor of Blood Glucose) 0 []  - Ankle / Brachial Index (ABI) - do not check if billed separately 0 Has the patient been seen at the hospital within the last three years: Yes Total Score: 140 Level Of Care: New/Established - Level 4 Electronic Signature(s) Signed: 12/16/2014 3:15:01 PM By: [name] [name] BSN, RN Entered By: [name] [name] on 12/16/2014 15:15:00 [name] [name] ([id number]) -------------------------------------------------------------------------------- Encounter Discharge  Information Details Patient Name: [name] [name], [name] Date of Service: 12/16/2014 2:30 PM Medical Record Number: [id number] Patient Account Number: [id number] Date of Birth/Sex: December 23, 1955 (59 y.o. Female) Treating RN: Afful, RN, BSN, [name] [name] Primary Care Physician: [name] [name] Other Clinician: Referring Physician: Denton [name] Treating Physician/Extender: [name] [name], [name] [name] in Treatment: 0 Encounter Discharge Information Items Discharge Pain Level: 0 Discharge Condition: Stable Ambulatory Status: Ambulatory Discharge Destination: Home Transportation: Private Auto Accompanied By: self Schedule Follow-up Appointment: No Medication Reconciliation completed and provided to Patient/Care No Amery Minasyan: Provided on Clinical Summary of Care: 12/16/2014 Form Type Recipient Paper Patient DA Electronic Signature(s) Signed: 12/16/2014 3:20:16 PM By: [name] [name] BSN, RN Previous Signature: 12/16/2014 3:07:29 PM Version By: Ruthine Dose Entered By: [name] [name] on 12/16/2014 15:20:15 [name] [name] ([id number]) -------------------------------------------------------------------------------- Lower Extremity Assessment Details Patient Name: [name] [name], NANNINGA. Date of Service: 12/16/2014 2:30 PM Medical Record Number: [id number] Patient Account Number: [id number] Date of Birth/Sex: 1955/07/23 (59 y.o. Female) Treating RN: Afful, RN, BSN, [name] [name] Primary Care Physician: [name] [name] Other Clinician: Referring Physician: Denton [name] Treating Physician/Extender: [name] [name], [name] [name] in Treatment: 0 Electronic Signature(s) Signed: 12/16/2014 2:31:32 PM By: [name] [name] BSN, RN Entered By: [name] [name] on 12/16/2014 [place], [place]thorn ([id number]) -------------------------------------------------------------------------------- Multi Wound Chart Details Patient Name: [name] [name], [name] [name]. Date of Service: 12/16/2014 2:30 PM Medical Record Number: [id number] Patient Account Number: [id number] Date of  Birth/Sex: April 13, 1955 (59 y.o. Female) Treating RN: [name] Gouty, RN, BSN, [name] [name] Primary Care Physician: [name] [name] Other Clinician: Referring Physician: Denton [name] Treating Physician/Extender: [name] [name], [name] [name] Weeks in Treatment: 0 Vital Signs Height(in): 64 Pulse(bpm): 94 Weight(lbs): 280 Blood Pressure 125/74 (mmHg): Body Mass Index(BMI): 48 Temperature(F): 97.8 Respiratory Rate 18 (breaths/min): Photos: [2:No Photos] [3:No Photos] [4:No Photos] Wound Location: [2:Left Neck - Posterior] [3:Left Axilla] [4:Right Axilla] Wounding Event: [2:Gradually Appeared] [3:Gradually Appeared] [4:Gradually Appeared] Primary Etiology: [2:Hidradenitis] [3:Hidradenitis] [4:Hidradenitis] Comorbid History: [2:Asthma, Sleep Apnea, Hypertension, Type II  Diabetes, Osteoarthritis, Neuropathy] [3:Asthma, Sleep Apnea, Hypertension, Type II Diabetes, Osteoarthritis, Neuropathy] [4:Asthma, Sleep Apnea, Hypertension, Type II Diabetes,  Osteoarthritis, Neuropathy] Date Acquired: [2:11/17/2014] [3:11/17/2014] [4:11/17/2014] Weeks of Treatment: [2:0] [3:0] [4:0] Wound Status: [2:Open] [3:Open] [4:Open] Measurements L x W x D 1x7.5x0.1 [3:1x5.5x0.1] [4:4.5x3x0.1] (cm) Area (cm) : [2:5.89] [3:4.32] [4:10.603] Volume (cm) : [2:0.589] [3:0.432] [4:1.06] % Reduction in Area: [2:0.00%] [3:0.00%] [4:0.00%] % Reduction in Volume: 0.00% [3:0.00%] [4:0.00%] Classification: [2:Unclassifiable] [3:Partial Thickness] [4:Partial Thickness] Exudate Amount: [2:None Present] [3:Small] [4:Small] Exudate Type: [2:N/A] [3:Serous] [4:Serous] Exudate Color: [2:N/A] [3:amber] [4:amber] Wound Margin: [2:Indistinct, nonvisible] [3:Indistinct, nonvisible] [4:Indistinct, nonvisible] Granulation Amount: [2:None Present (0%)] [3:Medium (34-66%)] [4:Small (1-33%)] Granulation Quality: [2:N/A] [3:Pink] [4:Pink, Pale] Necrotic Amount: [2:Large (67-100%)] [3:None Present (0%)] [4:None Present (0%)] Necrotic Tissue: [2:Eschar] [3:N/A]  [4:N/A] Exposed Structures: [2:Fascia: No Fat: No Tendon: No Muscle: No] [3:Fascia: No Fat: No Tendon: No Muscle: No] [4:Fascia: No Fat: No Tendon: No Muscle: No] Joint: No Joint: No Joint: No Bone: No Bone: No Bone: No Limited to Skin Limited to Skin Limited to Skin Breakdown Breakdown Breakdown Epithelialization: None None None Periwound Skin Texture: Edema: No Edema: No Edema: No Excoriation: No Excoriation: No Excoriation: No Induration: No Induration: No Induration: No Callus: No Callus: No Callus: No Crepitus: No Crepitus: No Crepitus: No Fluctuance: No Fluctuance: No Fluctuance: No Friable: No Friable: No Friable: No Rash: No Rash: No Rash: No Scarring: No Scarring: No Scarring: No Periwound Skin Dry/Scaly: Yes Maceration: No Moist: Yes Moisture: Maceration: No Moist: No Maceration: No Moist: No Dry/Scaly: No Dry/Scaly: No Periwound Skin Color: Atrophie Blanche: No Atrophie Blanche: No Atrophie Blanche: No Cyanosis: No Cyanosis: No Cyanosis: No Ecchymosis: No Ecchymosis: No Ecchymosis: No Erythema: No Erythema: No Erythema: No Hemosiderin Staining: No Hemosiderin Staining: No Hemosiderin Staining: No Mottled: No Mottled: No Mottled: No Pallor: No Pallor: No Pallor: No Rubor: No Rubor: No Rubor: No Temperature: N/A No Abnormality No Abnormality Tenderness on Yes Yes Yes Palpation: Wound Preparation: Ulcer Cleansing: Ulcer Cleansing: Ulcer Cleansing: Rinsed/Irrigated with Rinsed/Irrigated with Rinsed/Irrigated with Saline Saline Saline Topical Anesthetic Topical Anesthetic Topical Anesthetic Applied: None Applied: Other: Applied: Other: lidocaine lidocaine4% 4% Treatment Notes Electronic Signature(s) Signed: 12/16/2014 2:42:02 PM By: [name] [name] BSN, RN Entered By: [name] [name] on 12/16/2014 14:42:02 [name] [name] ([id number]) -------------------------------------------------------------------------------- [place] Details Patient Name: [name] [name], [name] [name]. Date of Service: 12/16/2014 2:30 PM Medical Record Number: [id number] Patient Account Number: [id number] Date of Birth/Sex: 10/09/1955 (59 y.o. Female) Treating RN: Afful, RN, BSN, [name] [name] Primary Care Physician: [name] [name] Other Clinician: Referring Physician: Denton [name] Treating Physician/Extender: [name] [name], [name] [name] in Treatment: 0 Active Inactive Orientation to the Wound Care Program Nursing Diagnoses: Knowledge deficit related to the wound healing center program Goals: Patient/caregiver will verbalize understanding of the [facility] Date Initiated: 12/16/2014 Goal Status: Active Interventions: Provide education on orientation to the wound center Notes: Wound/Skin Impairment Nursing Diagnoses: Knowledge deficit related to smoking impact on wound healing Knowledge deficit related to ulceration/compromised skin integrity Goals: Patient/caregiver will verbalize understanding of skin care regimen Date Initiated: 12/16/2014 Goal Status: Active Ulcer/skin breakdown will have a volume reduction of 30% by week 4 Date Initiated: 12/16/2014 Goal Status: Active Ulcer/skin breakdown will have a volume reduction of 50% by week 8 Date Initiated: 12/16/2014 Goal Status: Active Ulcer/skin breakdown will have a volume reduction of 80% by week 12 Date Initiated: 12/16/2014 Goal Status: Active Ulcer/skin breakdown will heal within 14 weeks Date Initiated: 12/16/2014 [name] [name], SCHWINN. ([id number]) Goal  Status: Active Interventions: Assess patient/caregiver ability to obtain necessary supplies Assess patient/caregiver ability to perform ulcer/skin care regimen upon admission and as needed Assess ulceration(s) every visit Provide education on ulcer and skin care Notes: Electronic Signature(s) Signed: 12/16/2014 2:41:50 PM By: [name] [name] BSN, RN Entered By: [name] [name] on 12/16/2014 14:41:49 [name] [name]  ([id number]) -------------------------------------------------------------------------------- Pain Assessment Details Patient Name: [name] [name]. Date of Service: 12/16/2014 2:30 PM Medical Record Number: [id number] Patient Account Number: [id number] Date of Birth/Sex: May 11, 1955 (59 y.o. Female) Treating RN: [name] Gouty, RN, BSN, [name] [name] Primary Care Physician: [name] [name] Other Clinician: Referring Physician: Denton [name] Treating Physician/Extender: [name] [name], [name] [name] in Treatment: 0 Active Problems Location of Pain Severity and Description of Pain Patient Has Paino No Site Locations Pain Management and Medication Current Pain Management: Electronic Signature(s) Signed: 12/16/2014 2:29:00 PM By: [name] [name] BSN, RN Entered By: [name] [name] on 12/16/2014 14:29:00 [name] [name] ([id number]) -------------------------------------------------------------------------------- Patient/Caregiver Education Details Patient Name: [name] [name], [name] [name]. Date of Service: 12/16/2014 2:30 PM Medical Record Number: [id number] Patient Account Number: [id number] Date of Birth/Gender: [birth year] (59 y.o. Female) Treating RN: [name] Gouty, RN, BSN, [name] [name] Primary Care Physician: [name] [name] Other Clinician: Referring Physician: Denton [name] Treating Physician/Extender: [name] [name], [name] [name] in Treatment: 0 Education Assessment Education Provided To: Patient Education Topics Provided Basic Hygiene: Methods: Explain/Verbal Responses: State content correctly Welcome To [facility]: Methods: Explain/Verbal Responses: State content correctly Wound/Skin Impairment: Methods: Explain/Verbal Responses: State content correctly Electronic Signature(s) Signed: 12/16/2014 3:20:38 PM By: [name] [name] BSN, RN Entered By: [name] [name] on 12/16/2014 15:20:38 [name] [name] ([id number]) -------------------------------------------------------------------------------- Wound Assessment Details Patient Name: [name] [name], APPERSON. Date of Service: 12/16/2014 2:30 PM Medical Record Number: [id number] Patient Account Number: [id number] Date of Birth/Sex: 1955/12/10 (59 y.o. Female) Treating RN: Afful, RN, BSN, [place] Primary Care Physician: [name] [name] Other Clinician: Referring Physician: Denton [name] Treating Physician/Extender: [name] [name], [name] [name] Weeks in Treatment: 0 Wound Status Wound Number: 2 Primary Hidradenitis Etiology: Wound Location: Left Neck - Posterior Wound Open Wounding Event: Gradually Appeared Status: Date Acquired: 11/17/2014 Comorbid Asthma, Sleep Apnea, Hypertension, Weeks Of Treatment: 0 History: Type II Diabetes, Osteoarthritis, Clustered Wound: No Neuropathy Photos Photo Uploaded By: [name] [name] on 12/16/2014 15:25:08 Wound Measurements Length: (cm) 1 Width: (cm) 7.5 Depth: (cm) 0.1 Area: (cm) 5.89 Volume: (cm) 0.589 % Reduction in Area: 0% % Reduction in Volume: 0% Epithelialization: None Tunneling: No Undermining: No Wound Description Classification: Unclassifiable Wound Margin: Indistinct, nonvisible Exudate Amount: None Present Foul Odor After Cleansing: No Wound Bed Granulation Amount: None Present (0%) Exposed Structure Necrotic Amount: Large (67-100%) Fascia Exposed: No Necrotic Quality: Eschar Fat Layer Exposed: No Tendon Exposed: No Muscle Exposed: No Joint Exposed: No BIRDIE, FETTY ([id number]) Bone Exposed: No Limited to Skin Breakdown Periwound Skin Texture Texture Color No Abnormalities Noted: No No Abnormalities Noted: No Callus: No Atrophie Blanche: No Crepitus: No Cyanosis: No Excoriation: No Ecchymosis: No Fluctuance: No Erythema: No Friable: No Hemosiderin Staining: No Induration: No Mottled: No Localized Edema: No Pallor: No Rash: No Rubor: No Scarring: No Temperature / Pain Moisture Tenderness on Palpation: Yes No Abnormalities Noted: No Dry / Scaly: Yes Maceration: No Moist: No Wound Preparation Ulcer Cleansing:  Rinsed/Irrigated with Saline Topical Anesthetic Applied: None Treatment Notes Wound #2 (Left, Posterior Neck) 1. Cleansed with: Clean wound with Normal Saline 4. Dressing Applied: Aquacel Ag 5. Secondary Dressing Applied Dry Gauze 7. Secured with Recruitment consultant) Signed: 12/16/2014 2:35:24 PM By: [name] [name] BSN, RN Entered By:  [name] [name] on 12/16/2014 14:35:24 FRANCEEN, ERISMAN ([id number]) -------------------------------------------------------------------------------- Wound Assessment Details Patient Name: PAIGHTON, GODETTE. Date of Service: 12/16/2014 2:30 PM Medical Record Number: [id number] Patient Account Number: [id number] Date of Birth/Sex: May 23, 1955 (59 y.o. Female) Treating RN: Afful, RN, BSN, [place] Primary Care Physician: [name] [name] Other Clinician: Referring Physician: Denton [name] Treating Physician/Extender: [name] [name], [name] [name] Weeks in Treatment: 0 Wound Status Wound Number: 3 Primary Hidradenitis Etiology: Wound Location: Left Axilla Wound Open Wounding Event: Gradually Appeared Status: Date Acquired: 11/17/2014 Comorbid Asthma, Sleep Apnea, Hypertension, Weeks Of Treatment: 0 History: Type II Diabetes, Osteoarthritis, Clustered Wound: No Neuropathy Photos Photo Uploaded By: [name] [name] on 12/16/2014 15:25:09 Wound Measurements Length: (cm) 1 Width: (cm) 5.5 Depth: (cm) 0.1 Area: (cm) 4.32 Volume: (cm) 0.432 % Reduction in Area: 0% % Reduction in Volume: 0% Epithelialization: None Tunneling: No Undermining: No Wound Description Classification: Partial Thickness Wound Margin: Indistinct, nonvisible Exudate Amount: Small Exudate Type: Serous Exudate Color: amber Foul Odor After Cleansing: No Wound Bed Granulation Amount: Medium (34-66%) Exposed Structure Granulation Quality: Pink Fascia Exposed: No Necrotic Amount: None Present (0%) Fat Layer Exposed: No Tendon Exposed: No GERRE, RANUM ([id number]) Muscle Exposed: No Joint  Exposed: No Bone Exposed: No Limited to Skin Breakdown Periwound Skin Texture Texture Color No Abnormalities Noted: No No Abnormalities Noted: No Callus: No Atrophie Blanche: No Crepitus: No Cyanosis: No Excoriation: No Ecchymosis: No Fluctuance: No Erythema: No Friable: No Hemosiderin Staining: No Induration: No Mottled: No Localized Edema: No Pallor: No Rash: No Rubor: No Scarring: No Temperature / Pain Moisture Temperature: No Abnormality No Abnormalities Noted: No Tenderness on Palpation: Yes Dry / Scaly: No Maceration: No Moist: No Wound Preparation Ulcer Cleansing: Rinsed/Irrigated with Saline Topical Anesthetic Applied: Other: lidocaine4%, Treatment Notes Wound #3 (Left Axilla) 1. Cleansed with: Clean wound with Normal Saline 4. Dressing Applied: Aquacel Ag 5. Secondary Dressing Applied Dry Gauze 7. Secured with Recruitment consultant) Signed: 12/16/2014 2:40:18 PM By: [name] [name] BSN, RN Entered By: [name] [name] on 12/16/2014 14:40:18 [name] [name] ([id number]) -------------------------------------------------------------------------------- Wound Assessment Details Patient Name: TEKA, CHANDA. Date of Service: 12/16/2014 2:30 PM Medical Record Number: [id number] Patient Account Number: [id number] Date of Birth/Sex: September 24, 1955 (59 y.o. Female) Treating RN: Afful, RN, BSN, [place] Primary Care Physician: [name] [name] Other Clinician: Referring Physician: Denton [name] Treating Physician/Extender: [name] [name], [name] [name] Weeks in Treatment: 0 Wound Status Wound Number: 4 Primary Hidradenitis Etiology: Wound Location: Right Axilla Wound Open Wounding Event: Gradually Appeared Status: Date Acquired: 11/17/2014 Comorbid Asthma, Sleep Apnea, Hypertension, Weeks Of Treatment: 0 History: Type II Diabetes, Osteoarthritis, Clustered Wound: No Neuropathy Photos Photo Uploaded By: [name] [name] on 12/16/2014 15:25:10 Wound Measurements Length: (cm)  4.5 Width: (cm) 3 Depth: (cm) 0.1 Area: (cm) 10.603 Volume: (cm) 1.06 % Reduction in Area: 0% % Reduction in Volume: 0% Epithelialization: None Tunneling: No Undermining: No Wound Description Classification: Partial Thickness Wound Margin: Indistinct, nonvisible Exudate Amount: Small Exudate Type: Serous Exudate Color: amber Foul Odor After Cleansing: No Wound Bed Granulation Amount: Small (1-33%) Exposed Structure Granulation Quality: Pink, Pale Fascia Exposed: No Necrotic Amount: None Present (0%) Fat Layer Exposed: No Tendon Exposed: No EVADNE, OSE. ([id number]) Muscle Exposed: No Joint Exposed: No Bone Exposed: No Limited to Skin Breakdown Periwound Skin Texture Texture Color No Abnormalities Noted: No No Abnormalities Noted: No Callus: No Atrophie Blanche: No Crepitus: No Cyanosis: No Excoriation: No Ecchymosis: No Fluctuance: No Erythema: No Friable: No Hemosiderin Staining: No Induration: No Mottled: No Localized Edema: No Pallor: No  Rash: No Rubor: No Scarring: No Temperature / Pain Moisture Temperature: No Abnormality No Abnormalities Noted: No Tenderness on Palpation: Yes Dry / Scaly: No Maceration: No Moist: Yes Wound Preparation Ulcer Cleansing: Rinsed/Irrigated with Saline Topical Anesthetic Applied: Other: lidocaine 4%, Treatment Notes Wound #4 (Right Axilla) 1. Cleansed with: Clean wound with Normal Saline 4. Dressing Applied: Aquacel Ag 5. Secondary Dressing Applied Dry Gauze 7. Secured with Recruitment consultant) Signed: 12/16/2014 2:41:16 PM By: [name] [name] BSN, RN Entered By: [name] [name] on 12/16/2014 14:41:16 [name] [name] ([id number]) -------------------------------------------------------------------------------- Vitals Details Patient Name: LASEAN, RAHMING. Date of Service: 12/16/2014 2:30 PM Medical Record Number: [id number] Patient Account Number: [id number] Date of Birth/Sex: [birth year] (59 y.o.  Female) Treating RN: Afful, RN, BSN, [place] Primary Care Physician: [name] [name] Other Clinician: Referring Physician: Denton [name] Treating Physician/Extender: [name] [name], [name] [name] Weeks in Treatment: 0 Vital Signs Time Taken: 14:29 Temperature (F): 97.8 Height (in): 64 Pulse (bpm): 94 Source: Stated Respiratory Rate (breaths/min): 18 Weight (lbs): 280 Blood Pressure (mmHg): 125/74 Source: Measured Reference Range: 80 - 120 mg / dl Body Mass Index (BMI): 48.1 Electronic Signature(s) Signed: 12/16/2014 2:31:22 PM By: [name] [name] BSN, RN Entered By: [name] [name] on 12/16/2014 14:31:21

## 2014-12-17 NOTE — Progress Notes (Signed)
[name] [name], [name] [name] ([id number]) Visit Report for 12/16/2014 Chief Complaint Document Details Patient Name: [name] [name], [name] [name]. Date of Service: 12/16/2014 2:30 PM Medical Record Number: [id number] Patient Account Number: [id number] Date of Birth/Sex: 09-Feb-1956 (59 y.o. Female) Treating RN: Baruch Gouty, RN, BSN, Velva Harman Primary Care Physician: Denton Lank Other Clinician: Referring Physician: Denton Lank Treating Physician/Extender: BURNS III, Charlean Sanfilippo in Treatment: 0 Information Obtained from: Patient Chief Complaint Hidradenitis involving bilateral axilla and left posterior cervical area. Electronic Signature(s) Signed: 12/16/2014 3:24:31 PM By: Loletha Grayer MD Entered By: Loletha Grayer on 12/16/2014 15:03:27 [name] [name] ([id number]) -------------------------------------------------------------------------------- HPI Details Patient Name: [name] [name], [name] [name]. Date of Service: 12/16/2014 2:30 PM Medical Record Number: [id number] Patient Account Number: [id number] Date of Birth/Sex: [birth year] (59 y.o. Female) Treating RN: Afful, RN, BSN, Velva Harman Primary Care Physician: Denton Lank Other Clinician: Referring Physician: Denton Lank Treating Physician/Extender: BURNS III, Charlean Sanfilippo in Treatment: 0 History of Present Illness HPI Description: The patient is a pleasant 59 year old with a past medical history significant for diabetes, obesity, and hidradenitis. Treated previously in the wound clinic for lower extremity ulcerations healed with compression. She has a history of chronic, recurrent hidradenitis involving bilateral axilla and left posterior neck. She has been on and off antibiotics for the past year. Saw Dr. Pat Patrick previously in general surgery for consideration of wide excision. She decided to hold off on surgery. She noted recurrent swelling, redness, and drainage involving both axilla and her left posterior neck in early September 2016. Went to the emergency room and was  prescribed Keflex and doxycycline, which she is taking. She reports progressive improvement. No significant pain at this time. Mild purulent drainage from both axilla and left posterior neck. Worse in left axilla. No fever or chills. Electronic Signature(s) Signed: 12/16/2014 3:24:31 PM By: Loletha Grayer MD Entered By: Loletha Grayer on 12/16/2014 15:06:20 [name] [name] ([id number]) -------------------------------------------------------------------------------- Physical Exam Details Patient Name: [name] [name], [name] [name]. Date of Service: 12/16/2014 2:30 PM Medical Record Number: [id number] Patient Account Number: [id number] Date of Birth/Sex: 1955-04-15 (59 y.o. Female) Treating RN: Baruch Gouty, RN, BSN, Velva Harman Primary Care Physician: Denton Lank Other Clinician: Referring Physician: Denton Lank Treating Physician/Extender: BURNS III, WALTER Weeks in Treatment: 0 Constitutional . Pulse regular. Respirations normal and unlabored. Afebrile. [name] Respiratory WNL. No retractions.. Breath sounds WNL, No rubs, rales, rhonchi, or wheeze.. Cardiovascular Heart rhythm and rate regular, no murmur or gallop.. Integumentary (Hair, Skin) .[name] Neurological Sensation normal to touch, pin,and vibration. Psychiatric Judgement and insight Intact.. Oriented times 3.. No evidence of depression, anxiety, or agitation.. Notes Bilateral axillary and left posterior cervical hidradenitis. No undrained abscesses. Minimal cellulitis. I could express several drops of purulent drainage from the left axilla. Cultures obtained. Minimally tender. Electronic Signature(s) Signed: 12/16/2014 3:24:31 PM By: Loletha Grayer MD Entered By: Loletha Grayer on 12/16/2014 15:08:05 [name] [name] ([id number]) -------------------------------------------------------------------------------- Physician Orders Details Patient Name: [name] [name], [name] [name]. Date of Service: 12/16/2014 2:30 PM Medical Record Number: [id number] Patient  Account Number: [id number] Date of Birth/Sex: [birth year] (59 y.o. Female) Treating RN: Baruch Gouty, RN, BSN, Velva Harman Primary Care Physician: Denton Lank Other Clinician: Referring Physician: Denton Lank Treating Physician/Extender: BURNS III, Charlean Sanfilippo in Treatment: 0 Verbal / Phone Orders: Yes Clinician: Afful, RN, BSN, Rita Read Back and Verified: Yes Diagnosis Coding Wound Cleansing Wound #2 Left,Posterior Neck o Clean wound with Normal Saline. o Cleanse wound with mild soap and water o May Shower, gently pat wound dry prior to applying new dressing. Wound #3 Left  Axilla o Clean wound with Normal Saline. o Cleanse wound with mild soap and water o May Shower, gently pat wound dry prior to applying new dressing. Wound #4 Right Axilla o Clean wound with Normal Saline. o Cleanse wound with mild soap and water o May Shower, gently pat wound dry prior to applying new dressing. Anesthetic o Topical Lidocaine 4% cream applied to wound bed prior to debridement Primary Wound Dressing Wound #2 Left,Posterior Neck o Aquacel Ag Wound #3 Left Axilla o Aquacel Ag Wound #4 Right Axilla o Aquacel Ag Secondary Dressing Wound #2 Left,Posterior Neck o Dry Gauze Wound #3 Left Axilla o Dry Gauze Wound #4 Right Axilla TATYM, SCHERMER. ([id number]) o Dry Gauze Dressing Change Frequency Wound #2 Left,Posterior Neck o Change dressing every day. Wound #3 Left Axilla o Change dressing every day. Wound #4 Right Axilla o Change dressing every day. Follow-up Appointments Wound #2 Left,Posterior Neck o Return Appointment in 1 week. Wound #3 Left Axilla o Return Appointment in 1 week. Wound #4 Right Axilla o Return Appointment in 1 week. Laboratory o Culture and Sensitivity - left armpit oooo Electronic Signature(s) Signed: 12/16/2014 2:56:07 PM By: Regan Lemming BSN, RN Signed: 12/16/2014 3:24:31 PM By: Loletha Grayer MD Entered By: Regan Lemming  on 12/16/2014 14:56:06 [name] [name] ([id number]) -------------------------------------------------------------------------------- Problem List Details Patient Name: [name] [name], [name] [name]. Date of Service: 12/16/2014 2:30 PM Medical Record Number: [id number] Patient Account Number: [id number] Date of Birth/Sex: 01/06/1956 (59 y.o. Female) Treating RN: Baruch Gouty, RN, BSN, Velva Harman Primary Care Physician: Denton Lank Other Clinician: Referring Physician: Denton Lank Treating Physician/Extender: BURNS III, Charlean Sanfilippo in Treatment: 0 Active Problems ICD-10 Encounter Code Description Active Date Diagnosis L73.2 Hidradenitis suppurativa 12/16/2014 Yes E11.622 Type 2 diabetes mellitus with other skin ulcer 12/16/2014 Yes E66.9 Obesity, unspecified 12/16/2014 Yes Inactive Problems Resolved Problems Electronic Signature(s) Signed: 12/16/2014 3:24:31 PM By: Loletha Grayer MD Entered By: Loletha Grayer on 12/16/2014 15:06:49 [name] [name] ([id number]) -------------------------------------------------------------------------------- Progress Note/History and Physical Details Patient Name: [name] [name], [name] [name]. Date of Service: 12/16/2014 2:30 PM Medical Record Number: [id number] Patient Account Number: [id number] Date of Birth/Sex: 04/07/1955 (59 y.o. Female) Treating RN: Baruch Gouty, RN, BSN, Velva Harman Primary Care Physician: Denton Lank Other Clinician: Referring Physician: Denton Lank Treating Physician/Extender: BURNS III, Charlean Sanfilippo in Treatment: 0 Subjective Chief Complaint Information obtained from Patient Hidradenitis involving bilateral axilla and left posterior cervical area. History of Present Illness (HPI) The patient is a pleasant 59 year old with a past medical history significant for diabetes, obesity, and hidradenitis. Treated previously in the wound clinic for lower extremity ulcerations healed with compression. She has a history of chronic, recurrent hidradenitis involving bilateral  axilla and left posterior neck. She has been on and off antibiotics for the past year. Saw Dr. Pat Patrick previously in general surgery for consideration of wide excision. She decided to hold off on surgery. She noted recurrent swelling, redness, and drainage involving both axilla and her left posterior neck in early September 2016. Went to the emergency room and was prescribed Keflex and doxycycline, which she is taking. She reports progressive improvement. No significant pain at this time. Mild purulent drainage from both axilla and left posterior neck. Worse in left axilla. No fever or chills. Wound History Patient presents with 3 open wounds that have been present for approximately months. Patient has been treating wounds in the following manner: neosporin. The wounds have been healed in the past but have re- opened. Laboratory tests have not been performed in the last  month. Patient reportedly has not tested positive for an antibiotic resistant organism. Patient reportedly has not tested positive for osteomyelitis. Patient reportedly has not had testing performed to evaluate circulation in the legs. Patient experiences the following problems associated with their wounds: infection. Patient History Information obtained from Patient. Allergies codeine (Severity: Severe, Reaction: itch) Family History Cancer - Paternal Grandparents, Heart Disease - Siblings, Father, Hypertension - Father, Thyroid Problems - Father, No family history of Diabetes, Hereditary Spherocytosis, Kidney Disease, Lung Disease, Seizures, Stroke, Tuberculosis. [name] [name], [name] [name] ([id number]) Social History Former smoker, Marital Status - Divorced, Alcohol Use - Never, Drug Use - No History, Caffeine Use - Never. Medical History Eyes Denies history of Cataracts, Glaucoma, Optic Neuritis Ear/Nose/Mouth/Throat Denies history of Chronic sinus problems/congestion, Middle ear problems Hematologic/Lymphatic Denies history of  Anemia, Human Immunodeficiency Virus, Lymphedema, Sickle Cell Disease Respiratory Patient has history of Asthma, Sleep Apnea Cardiovascular Patient has history of Hypertension Endocrine Patient has history of Type II Diabetes Denies history of Type I Diabetes Genitourinary Denies history of End Stage Renal Disease Musculoskeletal Patient has history of Osteoarthritis Neurologic Patient has history of Neuropathy Oncologic Denies history of Received Chemotherapy, Received Radiation Patient is treated with Insulin, Oral Agents. Blood sugar is tested. Blood sugar results noted at the following times: Lunch - 200. Hospitalization/Surgery History - 03/20/2012, [facility], open wound. Medical And Surgical History Notes Constitutional Symptoms (General Health) Diabetic Type II; HTN, Hidradenitis, Vertigo, High Cholesterol; dermatitis, Immunological Hidrandenitis Integumentary (Skin) Hidrandenitis Review of Systems (ROS) Eyes The patient has no complaints or symptoms. Ear/Nose/Mouth/Throat The patient has no complaints or symptoms. Hematologic/Lymphatic The patient has no complaints or symptoms. Cardiovascular The patient has no complaints or symptoms. Genitourinary The patient has no complaints or symptoms. LAKYRA, TIPPINS. ([id number]) Immunological The patient has no complaints or symptoms. Integumentary (Skin) Complains or has symptoms of Wounds. Musculoskeletal The patient has no complaints or symptoms. Neurologic The patient has no complaints or symptoms. Oncologic The patient has no complaints or symptoms. Objective Constitutional Pulse regular. Respirations normal and unlabored. Afebrile. Vitals Time Taken: 2:29 PM, Height: 64 in, Source: Stated, Weight: 280 lbs, Source: Measured, BMI: 48.1, Temperature: 97.8 F, Pulse: 94 bpm, Respiratory Rate: 18 breaths/min, Blood Pressure: 125/74 mmHg. Respiratory WNL. No retractions.. Breath sounds WNL, No rubs, rales, rhonchi, or  wheeze.. Cardiovascular Heart rhythm and rate regular, no murmur or gallop.[name] Neurological Sensation normal to touch, pin,and vibration. Psychiatric Judgement and insight Intact.. Oriented times 3.. No evidence of depression, anxiety, or agitation.. General Notes: Bilateral axillary and left posterior cervical hidradenitis. No undrained abscesses. Minimal cellulitis. I could express several drops of purulent drainage from the left axilla. Cultures obtained. Minimally tender. Integumentary (Hair, Skin) Wound #2 status is Open. Original cause of wound was Gradually Appeared. The wound is located on the Left,Posterior Neck. The wound measures 1cm length x 7.5cm width x 0.1cm depth; 5.89cm^2 area and 0.589cm^3 volume. The wound is limited to skin breakdown. There is no tunneling or undermining noted. There is a none present amount of drainage noted. The wound margin is indistinct and nonvisible. There is no granulation within the wound bed. There is a large (67-100%) amount of necrotic tissue within the wound [name] [name], [name] [name]. ([id number]) bed including Eschar. The periwound skin appearance exhibited: Dry/Scaly. The periwound skin appearance did not exhibit: Callus, Crepitus, Excoriation, Fluctuance, Friable, Induration, Localized Edema, Rash, Scarring, Maceration, Moist, Atrophie Blanche, Cyanosis, Ecchymosis, Hemosiderin Staining, Mottled, Pallor, Rubor, Erythema. The periwound has tenderness on palpation. Wound #3 status is Open. Original  cause of wound was Gradually Appeared. The wound is located on the Left Axilla. The wound measures 1cm length x 5.5cm width x 0.1cm depth; 4.32cm^2 area and 0.432cm^3 volume. The wound is limited to skin breakdown. There is no tunneling or undermining noted. There is a small amount of serous drainage noted. The wound margin is indistinct and nonvisible. There is medium (34-66%) pink granulation within the wound bed. There is no necrotic tissue within the wound  bed. The periwound skin appearance did not exhibit: Callus, Crepitus, Excoriation, Fluctuance, Friable, Induration, Localized Edema, Rash, Scarring, Dry/Scaly, Maceration, Moist, Atrophie Blanche, Cyanosis, Ecchymosis, Hemosiderin Staining, Mottled, Pallor, Rubor, Erythema. Periwound temperature was noted as No Abnormality. The periwound has tenderness on palpation. Wound #4 status is Open. Original cause of wound was Gradually Appeared. The wound is located on the Right Axilla. The wound measures 4.5cm length x 3cm width x 0.1cm depth; 10.603cm^2 area and 1.06cm^3 volume. The wound is limited to skin breakdown. There is no tunneling or undermining noted. There is a small amount of serous drainage noted. The wound margin is indistinct and nonvisible. There is small (1-33%) pink, pale granulation within the wound bed. There is no necrotic tissue within the wound bed. The periwound skin appearance exhibited: Moist. The periwound skin appearance did not exhibit: Callus, Crepitus, Excoriation, Fluctuance, Friable, Induration, Localized Edema, Rash, Scarring, Dry/Scaly, Maceration, Atrophie Blanche, Cyanosis, Ecchymosis, Hemosiderin Staining, Mottled, Pallor, Rubor, Erythema. Periwound temperature was noted as No Abnormality. The periwound has tenderness on palpation. Assessment Active Problems ICD-10 L73.2 - Hidradenitis suppurativa E11.622 - Type 2 diabetes mellitus with other skin ulcer E66.9 - Obesity, unspecified Chronic, recurrent hidradenitis involving bilateral axilla and left posterior neck. Plan Wound Cleansing: Wound #2 Left,Posterior Neck: [name] [name], [name] [name]. ([id number]) Clean wound with Normal Saline. Cleanse wound with mild soap and water May Shower, gently pat wound dry prior to applying new dressing. Wound #3 Left Axilla: Clean wound with Normal Saline. Cleanse wound with mild soap and water May Shower, gently pat wound dry prior to applying new dressing. Wound #4 Right  Axilla: Clean wound with Normal Saline. Cleanse wound with mild soap and water May Shower, gently pat wound dry prior to applying new dressing. Anesthetic: Topical Lidocaine 4% cream applied to wound bed prior to debridement Primary Wound Dressing: Wound #2 Left,Posterior Neck: Aquacel Ag Wound #3 Left Axilla: Aquacel Ag Wound #4 Right Axilla: Aquacel Ag Secondary Dressing: Wound #2 Left,Posterior Neck: Dry Gauze Wound #3 Left Axilla: Dry Gauze Wound #4 Right Axilla: Dry Gauze Dressing Change Frequency: Wound #2 Left,Posterior Neck: Change dressing every day. Wound #3 Left Axilla: Change dressing every day. Wound #4 Right Axilla: Change dressing every day. Follow-up Appointments: Wound #2 Left,Posterior Neck: Return Appointment in 1 week. Wound #3 Left Axilla: Return Appointment in 1 week. Wound #4 Right Axilla: Return Appointment in 1 week. Laboratory ordered were: Culture and Sensitivity - left armpit [name] [name], [name] [name]. ([id number]) Continue Keflex and doxycycline. Follow-up on cultures obtained today. Alginate dressing changes. No IandD indicated at this time. She would like to meet with Dr. Pat Patrick again to discuss possible wide excision. Electronic Signature(s) Signed: 12/16/2014 3:24:31 PM By: Loletha Grayer MD Entered By: Loletha Grayer on 12/16/2014 15:09:41 [name] [name] ([id number]) -------------------------------------------------------------------------------- ROS/PFSH Details Patient Name: [name] [name], SHAWHAN. Date of Service: 12/16/2014 2:30 PM Medical Record Number: [id number] Patient Account Number: [id number] Date of Birth/Sex: [birth year] (59 y.o. Female) Treating RN: Afful, RN, BSN, Velva Harman Primary Care Physician: Denton Lank Other Clinician: Referring Physician: Posey Pronto,  SARAH Treating Physician/Extender: BURNS III, WALTER Weeks in Treatment: 0 Label Progress Note Print Version as History and Physical for this encounter Information Obtained  From Patient Wound History Do you currently have one or more open woundso Yes How many open wounds do you currently haveo 3 Approximately how long have you had your woundso months How have you been treating your wound(s) until nowo neosporin Has your wound(s) ever healed and then re-openedo Yes Have you had any lab work done in the past montho No Have you tested positive for an antibiotic resistant organism (MRSA, VRE)o No Have you tested positive for osteomyelitis (bone infection)o No Have you had any tests for circulation on your legso No Have you had other problems associated with your woundso Infection Integumentary (Skin) Complaints and Symptoms: Positive for: Wounds Medical History: Past Medical History Notes: Hidrandenitis Constitutional Symptoms (General Health) Medical History: Past Medical History Notes: Diabetic Type II; HTN, Hidradenitis, Vertigo, High Cholesterol; dermatitis, Eyes Complaints and Symptoms: No Complaints or Symptoms Medical History: Negative for: Cataracts; Glaucoma; Optic Neuritis Ear/Nose/Mouth/Throat Complaints and Symptoms: No Complaints or Symptoms MYIESHA, EDGAR ([id number]) Medical History: Negative for: Chronic sinus problems/congestion; Middle ear problems Hematologic/Lymphatic Complaints and Symptoms: No Complaints or Symptoms Medical History: Negative for: Anemia; Human Immunodeficiency Virus; Lymphedema; Sickle Cell Disease Respiratory Medical History: Positive for: Asthma; Sleep Apnea Cardiovascular Complaints and Symptoms: No Complaints or Symptoms Medical History: Positive for: Hypertension Endocrine Medical History: Positive for: Type II Diabetes Negative for: Type I Diabetes Time with diabetes: 20 years Treated with: Insulin, Oral agents Blood sugar tested every day: Yes Tested : Blood sugar testing results: Lunch: 200 Genitourinary Complaints and Symptoms: No Complaints or Symptoms Medical History: Negative  for: End Stage Renal Disease Immunological Complaints and Symptoms: No Complaints or Symptoms Medical History: Past Medical History Notes: ONEDIA, VARGUS ([id number]) Hidrandenitis Musculoskeletal Complaints and Symptoms: No Complaints or Symptoms Medical History: Positive for: Osteoarthritis Neurologic Complaints and Symptoms: No Complaints or Symptoms Medical History: Positive for: Neuropathy Oncologic Complaints and Symptoms: No Complaints or Symptoms Medical History: Negative for: Received Chemotherapy; Received Radiation Hospitalization / Surgery History Name of Hospital Purpose of Hospitalization/Surgery Date [facility] open wound 03/20/2012 Family and Social History Cancer: Yes - Paternal Grandparents; Diabetes: No; Heart Disease: Yes - Siblings, Father; Hereditary Spherocytosis: No; Hypertension: Yes - Father; Kidney Disease: No; Lung Disease: No; Seizures: No; Stroke: No; Thyroid Problems: Yes - Father; Tuberculosis: No; Former smoker; Marital Status - Divorced; Alcohol Use: Never; Drug Use: No History; Caffeine Use: Never; Financial Concerns: No; Food, Clothing or Shelter Needs: No; Support System Lacking: No; Transportation Concerns: No; Living Will: No; Medical Power of Attorney: No Physician Affirmation I have reviewed and agree with the above information. Electronic Signature(s) Signed: 12/16/2014 3:24:31 PM By: Loletha Grayer MD Signed: 12/16/2014 4:41:51 PM By: Regan Lemming BSN, RN Previous Signature: 12/16/2014 2:48:28 PM Version By: Regan Lemming BSN, RN Previous Signature: 12/16/2014 2:23:40 PM Version By: Regan Lemming BSN, RN Entered By: Loletha Grayer on 12/16/2014 15:02:32 [name] [name] ([id number]) -------------------------------------------------------------------------------- SuperBill Details Patient Name: GABY, HARNEY. Date of Service: 12/16/2014 Medical Record Number: [id number] Patient Account Number: [id number] Date of Birth/Sex: 16-Jun-1955 (59  y.o. Female) Treating RN: Baruch Gouty, RN, BSN, Velva Harman Primary Care Physician: Denton Lank Other Clinician: Referring Physician: Denton Lank Treating Physician/Extender: BURNS III, Charlean Sanfilippo in Treatment: 0 Diagnosis Coding ICD-10 Codes Code Description L73.2 Hidradenitis suppurativa E11.622 Type 2 diabetes mellitus with other skin ulcer E66.9 Obesity, unspecified Facility Procedures CPT4 Code: 30092330 Description:  47092 - WOUND CARE VISIT-LEV 4 EST PT Modifier: Quantity: 1 Physician Procedures CPT4 Code: [id number] Description: 03709 - WC PHYS LEVEL 4 - EST PT ICD-10 Description Diagnosis L73.2 Hidradenitis suppurativa E11.622 Type 2 diabetes mellitus with other skin ulcer Modifier: Quantity: 1 Electronic Signature(s) Signed: 12/16/2014 3:15:18 PM By: Regan Lemming BSN, RN Signed: 12/16/2014 3:24:31 PM By: Loletha Grayer MD Entered By: Regan Lemming on 12/16/2014 15:15:18

## 2014-12-18 ENCOUNTER — Telehealth: Payer: Self-pay | Admitting: General Surgery

## 2014-12-18 NOTE — Telephone Encounter (Signed)
Left voice message for patient to call us to schedule appointment for recurrent hidradenitis bilateral axilla and left posterior neck. Ok per Dr. Adonis Huguenin

## 2014-12-22 LAB — WOUND CULTURE

## 2014-12-23 ENCOUNTER — Encounter: Payer: Medicare Other | Attending: Surgery | Admitting: Surgery

## 2014-12-23 DIAGNOSIS — E669 Obesity, unspecified: Secondary | ICD-10-CM | POA: Insufficient documentation

## 2014-12-23 DIAGNOSIS — L732 Hidradenitis suppurativa: Secondary | ICD-10-CM | POA: Insufficient documentation

## 2014-12-23 DIAGNOSIS — E11622 Type 2 diabetes mellitus with other skin ulcer: Secondary | ICD-10-CM | POA: Insufficient documentation

## 2014-12-24 NOTE — Progress Notes (Addendum)
KENDEL, PESNELL ([id number]) Visit Report for 12/23/2014 Arrival Information Details Patient Name: [name] [name], [name] [name]. Date of Service: 12/23/2014 3:00 PM Medical Record Number: [id number] Patient Account Number: [id number] Date of Birth/Sex: Sep 07, 1955 (59 y.o. Female) Treating RN: Montey Hora Primary Care Physician: Denton Lank Other Clinician: Referring Physician: Denton Lank Treating Physician/Extender: BURNS III, Charlean Sanfilippo in Treatment: 1 Visit Information History Since Last Visit Added or deleted any medications: No Patient Arrived: Ambulatory Any new allergies or adverse reactions: No Arrival Time: 14:58 Had a fall or experienced change in No Accompanied By: self activities of daily living that may affect Transfer Assistance: None risk of falls: Patient Identification Verified: Yes Signs or symptoms of abuse/neglect since last No Secondary Verification Process Yes visito Completed: Hospitalized since last visit: No Patient Requires Transmission-Based No Pain Present Now: No Precautions: Patient Has Alerts: No Electronic Signature(s) Signed: 12/23/2014 4:40:32 PM By: Montey Hora Previous Signature: 12/23/2014 4:37:40 PM Version By: Montey Hora Entered By: Montey Hora on 12/23/2014 16:40:31 [name] [name] ([id number]) -------------------------------------------------------------------------------- Encounter Discharge Information Details Patient Name: [name] [name], [name] [name]. Date of Service: 12/23/2014 3:00 PM Medical Record Number: [id number] Patient Account Number: [id number] Date of Birth/Sex: 03/06/1956 (59 y.o. Female) Treating RN: Montey Hora Primary Care Physician: Denton Lank Other Clinician: Referring Physician: Denton Lank Treating Physician/Extender: BURNS III, Charlean Sanfilippo in Treatment: 1 Encounter Discharge Information Items Discharge Pain Level: 0 Discharge Condition: Stable Ambulatory Status: Ambulatory Discharge Destination:  Home Transportation: Private Auto Accompanied By: self Schedule Follow-up Appointment: Yes Medication Reconciliation completed and provided to Patient/Care No Kohle Winner: Provided on Clinical Summary of Care: 12/23/2014 Form Type Recipient Paper Patient DA Electronic Signature(s) Signed: 12/23/2014 3:36:34 PM By: Ruthine Dose Entered By: Ruthine Dose on 12/23/2014 15:36:34 [name] [name] ([id number]) -------------------------------------------------------------------------------- Multi Wound Chart Details Patient Name: [name] [name]. Date of Service: 12/23/2014 3:00 PM Medical Record Number: [id number] Patient Account Number: [id number] Date of Birth/Sex: 27-Nov-1955 (59 y.o. Female) Treating RN: Montey Hora Primary Care Physician: Denton Lank Other Clinician: Referring Physician: Denton Lank Treating Physician/Extender: BURNS III, WALTER Weeks in Treatment: 1 Vital Signs Height(in): 64 Pulse(bpm): 93 Weight(lbs): 280 Blood Pressure 115/50 (mmHg): Body Mass Index(BMI): 48 Temperature(F): 98.1 Respiratory Rate 18 (breaths/min): Photos: [2:No Photos] [3:No Photos] [4:No Photos] Wound Location: [2:Left, Posterior Neck] [3:Left Axilla] [4:Right Axilla] Wounding Event: [2:Gradually Appeared] [3:Gradually Appeared] [4:Gradually Appeared] Primary Etiology: [2:Hidradenitis] [3:Hidradenitis] [4:Hidradenitis] Comorbid History: [2:N/A] [3:Asthma, Sleep Apnea, Hypertension, Type II Diabetes, Osteoarthritis, Neuropathy] [4:Asthma, Sleep Apnea, Hypertension, Type II Diabetes, Osteoarthritis, Neuropathy] Date Acquired: [2:11/17/2014] [3:11/17/2014] [4:11/17/2014] Weeks of Treatment: [2:1] [3:1] [4:1] Wound Status: [2:Healed - Epithelialized] [3:Open] [4:Open] Measurements L x W x D 0x0x0 [3:0.9x1x0.1] [4:0.2x0.7x0.1] (cm) Area (cm) : [2:0] [3:0.707] [4:0.11] Volume (cm) : [2:0] [3:0.071] [4:0.011] % Reduction in Area: [2:100.00%] [3:83.60%] [4:99.00%] % Reduction in Volume:  100.00% [3:83.60%] [4:99.00%] Classification: [2:Unclassifiable] [3:Partial Thickness] [4:Partial Thickness] HBO Classification: [2:N/A] [3:N/A] [4:N/A] Exudate Amount: [2:N/A] [3:Small] [4:Small] Exudate Type: [2:N/A] [3:Serous] [4:Serous] Exudate Color: [2:N/A] [3:amber] [4:amber] Wound Margin: [2:N/A] [3:Indistinct, nonvisible] [4:Indistinct, nonvisible] Granulation Amount: [2:N/A] [3:Large (67-100%)] [4:Large (67-100%)] Granulation Quality: [2:N/A] [3:Pink] [4:Pink, Pale] Necrotic Amount: [2:N/A] [3:None Present (0%)] [4:None Present (0%)] Epithelialization: [2:N/A] [3:None] [4:None] Periwound Skin Texture: No Abnormalities Noted [3:Edema: No Excoriation: No Induration: No] [4:Edema: No Excoriation: No Induration: No] Callus: No Callus: No Crepitus: No Crepitus: No Fluctuance: No Fluctuance: No Friable: No Friable: No Rash: No Rash: No Scarring: No Scarring: No Periwound Skin No Abnormalities Noted Maceration: No Moist: Yes Moisture: Moist: No Maceration: No Dry/Scaly: No Dry/Scaly:  No Periwound Skin Color: No Abnormalities Noted Atrophie Blanche: No Atrophie Blanche: No Cyanosis: No Cyanosis: No Ecchymosis: No Ecchymosis: No Erythema: No Erythema: No Hemosiderin Staining: No Hemosiderin Staining: No Mottled: No Mottled: No Pallor: No Pallor: No Rubor: No Rubor: No Temperature: N/A No Abnormality No Abnormality Tenderness on No Yes Yes Palpation: Wound Preparation: N/A Ulcer Cleansing: Ulcer Cleansing: Rinsed/Irrigated with Rinsed/Irrigated with Saline Saline Topical Anesthetic Topical Anesthetic Applied: Other: lidocaine Applied: Other: lidocaine 4% 4% Wound Number: 5 N/A N/A Photos: No Photos N/A N/A Wound Location: Right Lower Leg N/A N/A Wounding Event: Trauma N/A N/A Primary Etiology: Trauma, Other N/A N/A Comorbid History: Asthma, Sleep Apnea, N/A N/A Hypertension, Type II Diabetes, Osteoarthritis, Neuropathy Date Acquired: 12/22/2014 N/A  N/A Weeks of Treatment: 0 N/A N/A Wound Status: Open N/A N/A Measurements L x W x D 1x1.3x0.1 N/A N/A (cm) Area (cm) : 1.021 N/A N/A Volume (cm) : 0.102 N/A N/A % Reduction in Area: 0.00% N/A N/A % Reduction in Volume: 0.00% N/A N/A Classification: Partial Thickness N/A N/A HBO Classification: Grade 1 N/A N/A Exudate Amount: Medium N/A N/A Exudate Type: Serous N/A N/A AASIYA, CREASEY. ([id number]) Exudate Color: amber N/A N/A Wound Margin: Flat and Intact N/A N/A Granulation Amount: Large (67-100%) N/A N/A Granulation Quality: Red, Pink N/A N/A Necrotic Amount: None Present (0%) N/A N/A Exposed Structures: Fascia: No N/A N/A Fat: No Tendon: No Muscle: No Joint: No Bone: No Limited to Skin Breakdown Epithelialization: Small (1-33%) N/A N/A Periwound Skin Texture: Edema: No N/A N/A Excoriation: No Induration: No Callus: No Crepitus: No Fluctuance: No Friable: No Rash: No Scarring: No Periwound Skin Maceration: No N/A N/A Moisture: Moist: No Dry/Scaly: No Periwound Skin Color: Atrophie Blanche: No N/A N/A Cyanosis: No Ecchymosis: No Erythema: No Hemosiderin Staining: No Mottled: No Pallor: No Rubor: No Temperature: No Abnormality N/A N/A Tenderness on No N/A N/A Palpation: Wound Preparation: Ulcer Cleansing: N/A N/A Rinsed/Irrigated with Saline Topical Anesthetic Applied: None Treatment Notes Electronic Signature(s) Signed: 12/23/2014 4:37:40 PM By: Montey Hora Entered By: Montey Hora on 12/23/2014 15:25:50 [name] [name] ([id number] [name], [name] [name] ([id number]) -------------------------------------------------------------------------------- [place] Details Patient Name: [name] [name], BARES. Date of Service: 12/23/2014 3:00 PM Medical Record Number: [id number] Patient Account Number: [id number] Date of Birth/Sex: 1955-11-26 (59 y.o. Female) Treating RN: Montey Hora Primary Care Physician: Denton Lank Other  Clinician: Referring Physician: Denton Lank Treating Physician/Extender: BURNS III, Charlean Sanfilippo in Treatment: 1 Active Inactive Orientation to the Wound Care Program Nursing Diagnoses: Knowledge deficit related to the wound healing center program Goals: Patient/caregiver will verbalize understanding of the [facility] Date Initiated: 12/16/2014 Goal Status: Active Interventions: Provide education on orientation to the wound center Notes: Wound/Skin Impairment Nursing Diagnoses: Knowledge deficit related to smoking impact on wound healing Knowledge deficit related to ulceration/compromised skin integrity Goals: Patient/caregiver will verbalize understanding of skin care regimen Date Initiated: 12/16/2014 Goal Status: Active Ulcer/skin breakdown will have a volume reduction of 30% by week 4 Date Initiated: 12/16/2014 Goal Status: Active Ulcer/skin breakdown will have a volume reduction of 50% by week 8 Date Initiated: 12/16/2014 Goal Status: Active Ulcer/skin breakdown will have a volume reduction of 80% by week 12 Date Initiated: 12/16/2014 Goal Status: Active Ulcer/skin breakdown will heal within 14 weeks Date Initiated: 12/16/2014 [name] [name], [name] [name] ([id number]) Goal Status: Active Interventions: Assess patient/caregiver ability to obtain necessary supplies Assess patient/caregiver ability to perform ulcer/skin care regimen upon admission and as needed Assess ulceration(s) every visit Provide education on ulcer and  skin care Notes: Electronic Signature(s) Signed: 12/23/2014 4:37:40 PM By: Montey Hora Entered By: Montey Hora on 12/23/2014 15:25:41 [name] [name] ([id number]) -------------------------------------------------------------------------------- Patient/Caregiver Education Details Patient Name: LABRINA, LINES. Date of Service: 12/23/2014 3:00 PM Medical Record Number: [id number] Patient Account Number: [id number] Date of Birth/Gender:  06-28-1955 (59 y.o. Female) Treating RN: Montey Hora Primary Care Physician: Denton Lank Other Clinician: Referring Physician: Denton Lank Treating Physician/Extender: BURNS III, Charlean Sanfilippo in Treatment: 1 Education Assessment Education Provided To: Patient Education Topics Provided Wound/Skin Impairment: Handouts: Other: wound care as ordered Methods: Demonstration, Explain/Verbal Responses: State content correctly Electronic Signature(s) Signed: 12/23/2014 4:37:40 PM By: Montey Hora Entered By: Montey Hora on 12/23/2014 15:36:07 [name] [name] ([id number]) -------------------------------------------------------------------------------- Wound Assessment Details Patient Name: LAIAH, POUNCEY. Date of Service: 12/23/2014 3:00 PM Medical Record Number: [id number] Patient Account Number: [id number] Date of Birth/Sex: 1955-04-02 (59 y.o. Female) Treating RN: Montey Hora Primary Care Physician: Denton Lank Other Clinician: Referring Physician: Denton Lank Treating Physician/Extender: BURNS III, WALTER Weeks in Treatment: 1 Wound Status Wound Number: 2 Primary Etiology: Hidradenitis Wound Location: Left, Posterior Neck Wound Status: Healed - Epithelialized Wounding Event: Gradually Appeared Date Acquired: 11/17/2014 Weeks Of Treatment: 1 Clustered Wound: No Photos Photo Uploaded By: Montey Hora on 12/23/2014 16:34:41 Wound Measurements Length: (cm) 0 Width: (cm) 0 Depth: (cm) 0 Area: (cm) 0 Volume: (cm) 0 % Reduction in Area: 100% % Reduction in Volume: 100% Wound Description Classification: Unclassifiable Periwound Skin Texture Texture Color No Abnormalities Noted: No No Abnormalities Noted: No Moisture No Abnormalities Noted: No Electronic Signature(s) Signed: 12/23/2014 4:37:40 PM By: Remigio Eisenmenger, [name] [name] ([id number]) Entered By: Montey Hora on 12/23/2014 15:05:27 [name] [name]  ([id number]) -------------------------------------------------------------------------------- Wound Assessment Details Patient Name: [name] [name], [name] [name]. Date of Service: 12/23/2014 3:00 PM Medical Record Number: [id number] Patient Account Number: [id number] Date of Birth/Sex: [birth year] (59 y.o. Female) Treating RN: Montey Hora Primary Care Physician: Denton Lank Other Clinician: Referring Physician: Denton Lank Treating Physician/Extender: BURNS III, WALTER Weeks in Treatment: 1 Wound Status Wound Number: 3 Primary Hidradenitis Etiology: Wound Location: Left Axilla Wound Open Wounding Event: Gradually Appeared Status: Date Acquired: 11/17/2014 Comorbid Asthma, Sleep Apnea, Hypertension, Weeks Of Treatment: 1 History: Type II Diabetes, Osteoarthritis, Clustered Wound: No Neuropathy Photos Photo Uploaded By: Montey Hora on 12/23/2014 16:34:42 Wound Measurements Length: (cm) 0.9 Width: (cm) 1 Depth: (cm) 0.1 Area: (cm) 0.707 Volume: (cm) 0.071 % Reduction in Area: 83.6% % Reduction in Volume: 83.6% Epithelialization: None Tunneling: No Undermining: No Wound Description Classification: Partial Thickness Wound Margin: Indistinct, nonvisible Exudate Amount: Small Exudate Type: Serous Exudate Color: amber Foul Odor After Cleansing: No Wound Bed Granulation Amount: Large (67-100%) Exposed Structure Granulation Quality: Pink Fascia Exposed: No Necrotic Amount: None Present (0%) Fat Layer Exposed: No Tendon Exposed: No [name] [name], [name] [name] ([id number]) Muscle Exposed: No Joint Exposed: No Bone Exposed: No Limited to Skin Breakdown Periwound Skin Texture Texture Color No Abnormalities Noted: No No Abnormalities Noted: No Callus: No Atrophie Blanche: No Crepitus: No Cyanosis: No Excoriation: No Ecchymosis: No Fluctuance: No Erythema: No Friable: No Hemosiderin Staining: No Induration: No Mottled: No Localized Edema: No Pallor: No Rash: No Rubor:  No Scarring: No Temperature / Pain Moisture Temperature: No Abnormality No Abnormalities Noted: No Tenderness on Palpation: Yes Dry / Scaly: No Maceration: No Moist: No Wound Preparation Ulcer Cleansing: Rinsed/Irrigated with Saline Topical Anesthetic Applied: Other: lidocaine 4%, Treatment Notes Wound #3 (Left Axilla) 1. Cleansed with: Clean wound with Normal Saline 2. Anesthetic Topical Lidocaine 4% cream  to wound bed prior to debridement 4. Dressing Applied: Aquacel Ag 5. Secondary Dressing Applied Dry Gauze 7. Secured with Microbiologist) Signed: 12/23/2014 4:37:40 PM By: Montey Hora Entered By: Montey Hora on 12/23/2014 15:12:47 [name] [name] ([id number]) -------------------------------------------------------------------------------- Wound Assessment Details Patient Name: [name] [name], [name] [name]. Date of Service: 12/23/2014 3:00 PM Medical Record Number: [id number] Patient Account Number: [id number] Date of Birth/Sex: October 24, 1955 (59 y.o. Female) Treating RN: Montey Hora Primary Care Physician: Denton Lank Other Clinician: Referring Physician: Denton Lank Treating Physician/Extender: BURNS III, WALTER Weeks in Treatment: 1 Wound Status Wound Number: 4 Primary Hidradenitis Etiology: Wound Location: Right Axilla Wound Open Wounding Event: Gradually Appeared Status: Date Acquired: 11/17/2014 Comorbid Asthma, Sleep Apnea, Hypertension, Weeks Of Treatment: 1 History: Type II Diabetes, Osteoarthritis, Clustered Wound: No Neuropathy Photos Photo Uploaded By: Montey Hora on 12/23/2014 16:35:10 Wound Measurements Length: (cm) 0.2 Width: (cm) 0.7 Depth: (cm) 0.1 Area: (cm) 0.11 Volume: (cm) 0.011 % Reduction in Area: 99% % Reduction in Volume: 99% Epithelialization: None Tunneling: No Undermining: No Wound Description Classification: Partial Thickness Wound Margin: Indistinct, nonvisible Exudate Amount: Small Exudate Type:  Serous Exudate Color: amber Foul Odor After Cleansing: No Wound Bed Granulation Amount: Large (67-100%) Exposed Structure Granulation Quality: Pink, Pale Fascia Exposed: No Necrotic Amount: None Present (0%) Fat Layer Exposed: No Tendon Exposed: No [name] [name], [name] [name] ([id number]) Muscle Exposed: No Joint Exposed: No Bone Exposed: No Limited to Skin Breakdown Periwound Skin Texture Texture Color No Abnormalities Noted: No No Abnormalities Noted: No Callus: No Atrophie Blanche: No Crepitus: No Cyanosis: No Excoriation: No Ecchymosis: No Fluctuance: No Erythema: No Friable: No Hemosiderin Staining: No Induration: No Mottled: No Localized Edema: No Pallor: No Rash: No Rubor: No Scarring: No Temperature / Pain Moisture Temperature: No Abnormality No Abnormalities Noted: No Tenderness on Palpation: Yes Dry / Scaly: No Maceration: No Moist: Yes Wound Preparation Ulcer Cleansing: Rinsed/Irrigated with Saline Topical Anesthetic Applied: Other: lidocaine 4%, Treatment Notes Wound #4 (Right Axilla) 1. Cleansed with: Clean wound with Normal Saline 2. Anesthetic Topical Lidocaine 4% cream to wound bed prior to debridement 4. Dressing Applied: Aquacel Ag 5. Secondary Dressing Applied Dry Gauze 7. Secured with Microbiologist) Signed: 12/23/2014 4:37:40 PM By: Montey Hora Entered By: Montey Hora on 12/23/2014 15:13:01 [name] [name] ([id number]) -------------------------------------------------------------------------------- Wound Assessment Details Patient Name: [name] [name], [name] [name]. Date of Service: 12/23/2014 3:00 PM Medical Record Number: [id number] Patient Account Number: [id number] Date of Birth/Sex: [birth year] (59 y.o. Female) Treating RN: Montey Hora Primary Care Physician: Denton Lank Other Clinician: Referring Physician: Denton Lank Treating Physician/Extender: BURNS III, WALTER Weeks in Treatment: 1 Wound Status Wound Number: 5  Primary Trauma, Other Etiology: Wound Location: Right Lower Leg Wound Open Wounding Event: Trauma Status: Date Acquired: 12/22/2014 Comorbid Asthma, Sleep Apnea, Hypertension, Weeks Of Treatment: 0 History: Type II Diabetes, Osteoarthritis, Clustered Wound: No Neuropathy Photos Photo Uploaded By: Montey Hora on 12/23/2014 16:35:11 Wound Measurements Length: (cm) 1 Width: (cm) 1.3 Depth: (cm) 0.1 Area: (cm) 1.021 Volume: (cm) 0.102 % Reduction in Area: 0% % Reduction in Volume: 0% Epithelialization: Small (1-33%) Tunneling: No Undermining: No Wound Description Classification: Partial Thickness Foul Odor A Diabetic Severity (Wagner): Grade 1 Wound Margin: Flat and Intact Exudate Amount: Medium Exudate Type: Serous Exudate Color: amber fter Cleansing: No Wound Bed Granulation Amount: Large (67-100%) Exposed Structure Granulation Quality: Red, Pink Fascia Exposed: No Necrotic Amount: None Present (0%) Fat Layer Exposed: No [name] [name], [name] M. ([id number]) Tendon Exposed: No Muscle Exposed: No Joint Exposed: No  Bone Exposed: No Limited to Skin Breakdown Periwound Skin Texture Texture Color No Abnormalities Noted: No No Abnormalities Noted: No Callus: No Atrophie Blanche: No Crepitus: No Cyanosis: No Excoriation: No Ecchymosis: No Fluctuance: No Erythema: No Friable: No Hemosiderin Staining: No Induration: No Mottled: No Localized Edema: No Pallor: No Rash: No Rubor: No Scarring: No Temperature / Pain Moisture Temperature: No Abnormality No Abnormalities Noted: No Dry / Scaly: No Maceration: No Moist: No Wound Preparation Ulcer Cleansing: Rinsed/Irrigated with Saline Topical Anesthetic Applied: None Treatment Notes Wound #5 (Right Lower Leg) 1. Cleansed with: Clean wound with Normal Saline 4. Dressing Applied: Other dressing (specify in notes) 5. Secondary Dressing Applied Bordered Foam Dressing Notes triple antibiotic  ointment Electronic Signature(s) Signed: 12/23/2014 4:37:40 PM By: Montey Hora Entered By: Montey Hora on 12/23/2014 15:13:37 [name] [name] ([id number]) -------------------------------------------------------------------------------- Vitals Details Patient Name: [name] [name], [name] [name]. Date of Service: 12/23/2014 3:00 PM Medical Record Number: [id number] Patient Account Number: [id number] Date of Birth/Sex: 08-May-1955 (59 y.o. Female) Treating RN: Montey Hora Primary Care Physician: Denton Lank Other Clinician: Referring Physician: Denton Lank Treating Physician/Extender: BURNS III, WALTER Weeks in Treatment: 1 Vital Signs Time Taken: 14:59 Temperature (F): 98.1 Height (in): 64 Pulse (bpm): 93 Weight (lbs): 280 Respiratory Rate (breaths/min): 18 Body Mass Index (BMI): 48.1 Blood Pressure (mmHg): 115/50 Reference Range: 80 - 120 mg / dl Electronic Signature(s) Signed: 12/23/2014 4:37:40 PM By: Montey Hora Entered By: Montey Hora on 12/23/2014 15:01:49

## 2014-12-24 NOTE — Progress Notes (Addendum)
[name] [name] ([id number]) Visit Report for 12/23/2014 Chief Complaint Document Details Patient Name: [name] [name], [name] [name]. Date of Service: 12/23/2014 3:00 PM Medical Record Number: [id number] Patient Account Number: [id number] Date of Birth/Sex: 27-May-1955 (59 y.o. Female) Treating RN: [name] [name] Primary Care Physician: [name] [name] Other Clinician: Referring Physician: Denton [name] Treating Physician/Extender: [name] [name] in Treatment: 1 Information Obtained from: Patient Chief Complaint Hidradenitis involving bilateral axilla and left posterior cervical area. Electronic Signature(s) Signed: 12/24/2014 8:27:30 AM By: [name] Grayer MD Entered By: [name] [name] on 12/24/2014 07:53:39 [name] [name] ([id number]) -------------------------------------------------------------------------------- HPI Details Patient Name: [name] [name], [name] [name]. Date of Service: 12/23/2014 3:00 PM Medical Record Number: [id number] Patient Account Number: [id number] Date of Birth/Sex: [birth year] (59 y.o. Female) Treating RN: [name] [name] Primary Care Physician: [name] [name] Other Clinician: Referring Physician: Denton [name] Treating Physician/Extender: [name] [name] in Treatment: 1 History of Present Illness HPI Description: The patient is a pleasant 59 year old with a past medical history significant for diabetes, obesity, and hidradenitis. Treated previously in the wound clinic for lower extremity ulcerations, which healed with compression. She has a history of chronic, recurrent hidradenitis involving bilateral axilla and left posterior neck. She has been on and off antibiotics for the past year. Saw Dr. Pat [name] previously in general surgery for consideration of wide excision. She decided to hold off on surgery. She noted recurrent swelling, redness, and drainage involving both axilla and her left posterior neck in early September 2016. Went to the emergency room and was prescribed  Keflex and doxycycline, which she is taking. Culture from 12/16/2014 grew Enterococcus faecalis, sensitive to ampicillin. She reports progressive improvement. No significant pain at this time. Mild purulent drainage from both axilla and left posterior neck. Worse in left axilla. No fever or chills. Electronic Signature(s) Signed: 12/24/2014 8:27:30 AM By: [name] Grayer MD Entered By: [name] [name] on 12/24/2014 07:55:54 [name] [name] ([id number]) -------------------------------------------------------------------------------- Physical Exam Details Patient Name: [name] [name], [name] [name]. Date of Service: 12/23/2014 3:00 PM Medical Record Number: [id number] Patient Account Number: [id number] Date of Birth/Sex: 04/12/1955 (59 y.o. Female) Treating RN: [name] [name] Primary Care Physician: [name] [name] Other Clinician: Referring Physician: Denton [name] Treating Physician/Extender: [name] III, [name] [name] Weeks in Treatment: 1 Constitutional . Pulse regular. Respirations normal and unlabored. Afebrile. [name] Respiratory WNL. No retractions.. Integumentary (Hair, Skin) .[name] Neurological Sensation normal to touch, pin,and vibration. Psychiatric Judgement and insight Intact.. Oriented times 3.. No evidence of depression, anxiety, or agitation.. Notes Bilateral axillary and left posterior cervical hidradenitis. No undrained abscesses. Minimal cellulitis. Mild seropurulent drainage. Granulation tissue treated with silver nitrate. Electronic Signature(s) Signed: 12/24/2014 8:27:30 AM By: [name] Grayer MD Entered By: [name] [name] on 12/24/2014 07:56:57 [name] [name] ([id number]) -------------------------------------------------------------------------------- Physician Orders Details Patient Name: [name] [name], [name] [name]. Date of Service: 12/23/2014 3:00 PM Medical Record Number: [id number] Patient Account Number: [id number] Date of Birth/Sex: January 03, 1956 (59 y.o. Female) Treating RN: [name] [name] Primary Care Physician: [name] [name] Other Clinician: Referring Physician: Denton [name] Treating Physician/Extender: [name] [name] in Treatment: 1 Verbal / Phone Orders: Yes Clinician: Montey [name] Read Back and Verified: Yes Diagnosis Coding Wound Cleansing Wound #3 Left Axilla o Clean wound with Normal Saline. o Cleanse wound with mild soap and water o May Shower, gently [name] wound dry prior to applying new dressing. Wound #4 Right Axilla o Clean wound with Normal Saline. o Cleanse wound with mild soap and water o May Shower, gently [name] wound dry prior to applying new  dressing. Wound #5 Right Lower Leg o Clean wound with Normal Saline. o Cleanse wound with mild soap and water o May Shower, gently [name] wound dry prior to applying new dressing. Anesthetic Wound #3 Left Axilla o Topical Lidocaine 4% cream applied to wound bed prior to debridement Wound #4 Right Axilla o Topical Lidocaine 4% cream applied to wound bed prior to debridement Wound #5 Right Lower Leg o Topical Lidocaine 4% cream applied to wound bed prior to debridement Primary Wound Dressing Wound #3 Left Axilla o Aquacel Ag Wound #4 Right Axilla o Aquacel Ag Wound #5 Right Lower Leg o Other: - triple antibiotic ointment Secondary Dressing [name], [name]. ([id number]) Wound #3 Left Axilla o Dry Gauze Wound #4 Right Axilla o Dry Gauze Wound #5 Right Lower Leg o Boardered Foam Dressing Dressing Change Frequency Wound #3 Left Axilla o Change dressing every day. Wound #4 Right Axilla o Change dressing every day. Wound #5 Right Lower Leg o Change dressing every day. Follow-up Appointments Wound #3 Left Axilla o Return Appointment in 1 week. Wound #4 Right Axilla o Return Appointment in 1 week. Wound #5 Right Lower Leg o Return Appointment in 1 week. Medications-please add to medication list. Wound #3 Left Axilla o P.O. Antibiotics -  augmentin to be started after current antibiotics run out Wound #4 Right Axilla o P.O. Antibiotics - augmentin to be started after current antibiotics run out Wound #5 Right Lower Leg o P.O. Antibiotics - augmentin to be started after current antibiotics run out Electronic Signature(s) Signed: 12/23/2014 4:13:26 PM By: [name] Grayer MD Signed: 12/23/2014 4:37:40 PM By: [name] [name] Entered By: [name] [name] on 12/23/2014 15:28:20 [name] [name] ([id number]) -------------------------------------------------------------------------------- Problem List Details Patient Name: [name] [name], [name] [name]. Date of Service: 12/23/2014 3:00 PM Medical Record Number: [id number] Patient Account Number: [id number] Date of Birth/Sex: May 23, 1955 (59 y.o. Female) Treating RN: [name] [name] Primary Care Physician: [name] [name] Other Clinician: Referring Physician: Denton [name] Treating Physician/Extender: [name] [name] in Treatment: 1 Active Problems ICD-10 Encounter Code Description Active Date Diagnosis L73.2 Hidradenitis suppurativa 12/16/2014 Yes E11.622 Type 2 diabetes mellitus with other skin ulcer 12/16/2014 Yes E66.9 Obesity, unspecified 12/16/2014 Yes Inactive Problems Resolved Problems Electronic Signature(s) Signed: 12/24/2014 8:27:30 AM By: [name] Grayer MD Entered By: [name] [name] on 12/24/2014 07:53:26 Giambra, Merceda Elks ([id number]) -------------------------------------------------------------------------------- Progress Note Details Patient Name: [name] [name], [name] [name]. Date of Service: 12/23/2014 3:00 PM Medical Record Number: [id number] Patient Account Number: [id number] Date of Birth/Sex: 1955-12-02 (59 y.o. Female) Treating RN: [name] [name] Primary Care Physician: [name] [name] Other Clinician: Referring Physician: Denton [name] Treating Physician/Extender: [name] [name] in Treatment: 1 Subjective Chief Complaint Information obtained from  Patient Hidradenitis involving bilateral axilla and left posterior cervical area. History of Present Illness (HPI) The patient is a pleasant 59 year old with a past medical history significant for diabetes, obesity, and hidradenitis. Treated previously in the wound clinic for lower extremity ulcerations, which healed with compression. She has a history of chronic, recurrent hidradenitis involving bilateral axilla and left posterior neck. She has been on and off antibiotics for the past year. Saw Dr. Pat [name] previously in general surgery for consideration of wide excision. She decided to hold off on surgery. She noted recurrent swelling, redness, and drainage involving both axilla and her left posterior neck in early September 2016. Went to the emergency room and was prescribed Keflex and doxycycline, which she is taking. Culture from 12/16/2014 grew Enterococcus faecalis, sensitive to ampicillin. She reports progressive improvement.  No significant pain at this time. Mild purulent drainage from both axilla and left posterior neck. Worse in left axilla. No fever or chills. Objective Constitutional Pulse regular. Respirations normal and unlabored. Afebrile. Vitals Time Taken: 2:59 PM, Height: 64 in, Weight: 280 lbs, BMI: 48.1, Temperature: 98.1 F, Pulse: 93 bpm, Respiratory Rate: 18 breaths/min, Blood Pressure: 115/50 mmHg. Respiratory WNL. No retractions.. Neurological Sensation normal to touch, pin,and vibration. Psychiatric EMMALISE, HUARD. ([id number]) Judgement and insight Intact.. Oriented times 3.. No evidence of depression, anxiety, or agitation.. General Notes: Bilateral axillary and left posterior cervical hidradenitis. No undrained abscesses. Minimal cellulitis. Mild seropurulent drainage. Granulation tissue treated with silver nitrate. Integumentary (Hair, Skin) Wound #2 status is Healed - Epithelialized. Original cause of wound was Gradually Appeared. The wound is located on the  Left,Posterior Neck. The wound measures 0cm length x 0cm width x 0cm depth; 0cm^2 area and 0cm^3 volume. Wound #3 status is Open. Original cause of wound was Gradually Appeared. The wound is located on the Left Axilla. The wound measures 0.9cm length x 1cm width x 0.1cm depth; 0.707cm^2 area and 0.071cm^3 volume. The wound is limited to skin breakdown. There is no tunneling or undermining noted. There is a small amount of serous drainage noted. The wound margin is indistinct and nonvisible. There is large (67- 100%) pink granulation within the wound bed. There is no necrotic tissue within the wound bed. The periwound skin appearance did not exhibit: Callus, Crepitus, Excoriation, Fluctuance, Friable, Induration, Localized Edema, Rash, Scarring, Dry/Scaly, Maceration, Moist, Atrophie Blanche, Cyanosis, Ecchymosis, Hemosiderin Staining, Mottled, Pallor, Rubor, Erythema. Periwound temperature was noted as No Abnormality. The periwound has tenderness on palpation. Wound #4 status is Open. Original cause of wound was Gradually Appeared. The wound is located on the Right Axilla. The wound measures 0.2cm length x 0.7cm width x 0.1cm depth; 0.11cm^2 area and 0.011cm^3 volume. The wound is limited to skin breakdown. There is no tunneling or undermining noted. There is a small amount of serous drainage noted. The wound margin is indistinct and nonvisible. There is large (67-100%) pink, pale granulation within the wound bed. There is no necrotic tissue within the wound bed. The periwound skin appearance exhibited: Moist. The periwound skin appearance did not exhibit: Callus, Crepitus, Excoriation, Fluctuance, Friable, Induration, Localized Edema, Rash, Scarring, Dry/Scaly, Maceration, Atrophie Blanche, Cyanosis, Ecchymosis, Hemosiderin Staining, Mottled, Pallor, Rubor, Erythema. Periwound temperature was noted as No Abnormality. The periwound has tenderness on palpation. Wound #5 status is Open. Original  cause of wound was Trauma. The wound is located on the Right Lower Leg. The wound measures 1cm length x 1.3cm width x 0.1cm depth; 1.021cm^2 area and 0.102cm^3 volume. The wound is limited to skin breakdown. There is no tunneling or undermining noted. There is a medium amount of serous drainage noted. The wound margin is flat and intact. There is large (67-100%) red, pink granulation within the wound bed. There is no necrotic tissue within the wound bed. The periwound skin appearance did not exhibit: Callus, Crepitus, Excoriation, Fluctuance, Friable, Induration, Localized Edema, Rash, Scarring, Dry/Scaly, Maceration, Moist, Atrophie Blanche, Cyanosis, Ecchymosis, Hemosiderin Staining, Mottled, Pallor, Rubor, Erythema. Periwound temperature was noted as No Abnormality. Assessment [name] [name], [name] [name] ([id number]) Active Problems ICD-10 L73.2 - Hidradenitis suppurativa E11.622 - Type 2 diabetes mellitus with other skin ulcer E66.9 - Obesity, unspecified Bilateral axillary and left posterior cervical hidradenitis. Plan Wound Cleansing: Wound #3 Left Axilla: Clean wound with Normal Saline. Cleanse wound with mild soap and water May Shower, gently [name] wound dry  prior to applying new dressing. Wound #4 Right Axilla: Clean wound with Normal Saline. Cleanse wound with mild soap and water May Shower, gently [name] wound dry prior to applying new dressing. Wound #5 Right Lower Leg: Clean wound with Normal Saline. Cleanse wound with mild soap and water May Shower, gently [name] wound dry prior to applying new dressing. Anesthetic: Wound #3 Left Axilla: Topical Lidocaine 4% cream applied to wound bed prior to debridement Wound #4 Right Axilla: Topical Lidocaine 4% cream applied to wound bed prior to debridement Wound #5 Right Lower Leg: Topical Lidocaine 4% cream applied to wound bed prior to debridement Primary Wound Dressing: Wound #3 Left Axilla: Aquacel Ag Wound #4 Right Axilla: Aquacel  Ag Wound #5 Right Lower Leg: Other: - triple antibiotic ointment Secondary Dressing: Wound #3 Left Axilla: [name] [name], [name] [name]. ([id number]) Dry Gauze Wound #4 Right Axilla: Dry Gauze Wound #5 Right Lower Leg: Boardered Foam Dressing Dressing Change Frequency: Wound #3 Left Axilla: Change dressing every day. Wound #4 Right Axilla: Change dressing every day. Wound #5 Right Lower Leg: Change dressing every day. Follow-up Appointments: Wound #3 Left Axilla: Return Appointment in 1 week. Wound #4 Right Axilla: Return Appointment in 1 week. Wound #5 Right Lower Leg: Return Appointment in 1 week. Medications-please add to medication list.: Wound #3 Left Axilla: P.O. Antibiotics - augmentin to be started after current antibiotics run out Wound #4 Right Axilla: P.O. Antibiotics - augmentin to be started after current antibiotics run out Wound #5 Right Lower Leg: P.O. Antibiotics - augmentin to be started after current antibiotics run out Antibiotic switch to Augmentin. Silver alginate dressing changes. General surgery consultation for consideration of wide excision of hairbearing areas. Electronic Signature(s) Signed: 12/24/2014 8:27:30 AM By: [name] Grayer MD Entered By: [name] [name] on 12/24/2014 07:58:09 [name] [name] ([id number]) -------------------------------------------------------------------------------- SuperBill Details Patient Name: [name] [name], [name] [name]. Date of Service: 12/23/2014 Medical Record Number: [id number] Patient Account Number: [id number] Date of Birth/Sex: Aug 22, 1955 (59 y.o. Female) Treating RN: [name] [name] Primary Care Physician: [name] [name] Other Clinician: Referring Physician: Denton [name] Treating Physician/Extender: [name] [name] in Treatment: 1 Diagnosis Coding ICD-10 Codes Code Description L73.2 Hidradenitis suppurativa E11.622 Type 2 diabetes mellitus with other skin ulcer E66.9 Obesity, unspecified Physician  Procedures CPT4 Code: [id number] Description: 19622 - WC PHYS LEVEL 3 - EST PT ICD-10 Description Diagnosis L73.2 Hidradenitis suppurativa Modifier: Quantity: 1 Electronic Signature(s) Signed: 12/24/2014 8:27:30 AM By: [name] Grayer MD Entered By: [name] [name] on 12/24/2014 07:58:25

## 2014-12-30 ENCOUNTER — Other Ambulatory Visit: Payer: Self-pay | Admitting: *Deleted

## 2014-12-30 ENCOUNTER — Encounter: Payer: Self-pay | Admitting: General Surgery

## 2014-12-30 ENCOUNTER — Ambulatory Visit (INDEPENDENT_AMBULATORY_CARE_PROVIDER_SITE_OTHER): Payer: Medicare Other | Admitting: General Surgery

## 2014-12-30 VITALS — BP 134/74 | HR 91 | Temp 98.0°F | Ht 64.0 in | Wt 288.0 lb

## 2014-12-30 DIAGNOSIS — L732 Hidradenitis suppurativa: Secondary | ICD-10-CM | POA: Diagnosis not present

## 2014-12-30 DIAGNOSIS — E119 Type 2 diabetes mellitus without complications: Secondary | ICD-10-CM | POA: Insufficient documentation

## 2014-12-30 DIAGNOSIS — I10 Essential (primary) hypertension: Secondary | ICD-10-CM | POA: Insufficient documentation

## 2014-12-30 DIAGNOSIS — J45909 Unspecified asthma, uncomplicated: Secondary | ICD-10-CM | POA: Insufficient documentation

## 2014-12-30 MED ORDER — CLINDAMYCIN PHOSPHATE 1 % EX SOLN: CUTANEOUS | Status: DC

## 2014-12-30 NOTE — Progress Notes (Signed)
Surgical Consultation  12/30/2014  [name] [name] is an 59 y.o. female.   Chief Complaint  Patient presents with  . Cyst    bilateral armpits and left side of neck posterior     HPI: 59 year old female presents to clinic for evaluation of hidradenitis. Patient states the last 3-4 years she has had chronic, recurrent hidradenitis to her bilateral axilla and her left posterior neck. She had surgery on her right axilla by her provider at [facility]. She states that the areas look the best they have today that they have been a long time due to just completing a course of antibiotics. She states she is been given topical dressing cares by primary care manager but denies any current drainage. She denies any current fevers, chills, nausea, vomiting, diarrhea, constipation, chest pain, shortness of breath. She does state that she has had significant weight gain and is currently also being treated for diabetes. Her primary care provider in [place] has provided the majority of her care at this time. She has no other complaints from each day.  Past Medical History  Diagnosis Date  . Diabetes mellitus without complication ([place])   . Thyroid disease   . COPD (chronic obstructive pulmonary disease) ([place])   . Vertigo   . Edema   . Allergic rhinitis   . Degenerative joint disease of knee, left   . Hidradenitis   . IBS (irritable bowel syndrome)   . Depression   . Vitamin D deficiency   . Insomnia   . Sleep apnea   . History of colonic polyps   . Sinusitis, chronic   . Hypertension   . Obesity   . Vaginitis, atrophic   . GERD (gastroesophageal reflux disease)   . Allergy     Past Surgical History  Procedure Laterality Date  . Abdominal hysterectomy    . Cesarean section      x 2  . Cholecystectomy    . Tonsillectomy      Family History  Problem Relation Age of Onset  . Rashes / Skin problems Father   . Hypertension Father     Social History:  reports that she has quit smoking. She  has never used smokeless tobacco. She reports that she does not drink alcohol or use illicit drugs.  Allergies:  Allergies  Allergen Reactions  . Codeine     Medications reviewed.     ROS  A multipoint review of systems was completed. All pertinent positives negatives within the history of present illness remainder were negative.   BP 134/74 mmHg  Pulse 91  Temp(Src) 98 F (36.7 C) (Oral)  Ht 5\' 4"  (1.626 m)  Wt 130.636 kg (288 lb)  BMI 49.41 kg/m2  Physical Exam  Gen.: No acute distress  chest: Clear to auscultation  heart: Regular rate and rhythm Abdomen: Soft, nontender, nondistended Skin: Multiple scarred areas consistent with hidradenitis. Left posterior neck with a single linear scar with 2 punctate areas consistent with prior hidradenitis infections. No active drainage or infection seen. Left axilla with greater disease burden in right axilla. Both with puncta of prior infections but without any signs of current infection. No areas of spreading erythema or palpable fluctuance noted on exam at any site today.  No results found for this or any previous visit (from the past 48 hour(s)). No results found.  Assessment/Plan: 1. Hidradenitis 59 year old female with now chronic hidradenitis of multiple sites. Discussed the role of surgery and when it would be indicated. Given the extent  of her disease will attempt to manage nonoperatively first with topical clindamycin. We'll do an interval check in 3 weeks to mark for any improvement or worsening of her disease burden. Discussed that so long as her surgical needs do not appear to require a rotational flap or other plastic surgery procedure will attempt to maintain her surgical care in the local area. Should she require a flap counsel her that we would then transfer her to a tertiary care center. - clindamycin (CLEOCIN-T) 1 % external solution; Apply to affected area 2 times daily  Dispense: 60 mL; Refill: 3  2. Essential  hypertension Patient was well-controlled hypertension. As well as diabetes and asthma. She's to continue follow-up with her primary care manager.   Clayburn Pert

## 2014-12-30 NOTE — Patient Instructions (Addendum)
Your next appointment is on 01/20/15 at 2:00 PM  in the [place] office. Your prescription is available at your pharmacy.

## 2015-01-06 ENCOUNTER — Ambulatory Visit: Payer: Medicare Other | Admitting: Surgery

## 2015-01-13 ENCOUNTER — Encounter: Payer: Medicare Other | Admitting: Surgery

## 2015-01-13 DIAGNOSIS — L732 Hidradenitis suppurativa: Secondary | ICD-10-CM | POA: Diagnosis not present

## 2015-01-14 NOTE — Progress Notes (Addendum)
DANUTA, HUSEMAN ([id number]) Visit Report for 01/13/2015 Chief Complaint Document Details Patient Name: [name] [name], [name] [name]. Date of Service: 01/13/2015 4:00 PM Medical Record Number: [id number] Patient Account Number: [id number] Date of Birth/Sex: Jul 30, 1955 (59 y.o. Female) Treating RN: Cornell Barman Primary Care Physician: Denton Lank Other Clinician: Referring Physician: Denton Lank Treating Physician/Extender: BURNS III, Charlean Sanfilippo in Treatment: 4 Information Obtained from: Patient Chief Complaint Hidradenitis involving bilateral axilla and left posterior cervical area. Electronic Signature(s) Signed: 01/14/2015 8:15:56 AM By: Loletha Grayer MD Entered By: Loletha Grayer on 01/14/2015 08:01:06 Kreg Shropshire ([id number]) -------------------------------------------------------------------------------- HPI Details Patient Name: [name], [name]. Date of Service: 01/13/2015 4:00 PM Medical Record Number: [id number] Patient Account Number: [id number] Date of Birth/Sex: 12/20/1955 (59 y.o. Female) Treating RN: Cornell Barman Primary Care Physician: Denton Lank Other Clinician: Referring Physician: Denton Lank Treating Physician/Extender: BURNS III, Charlean Sanfilippo in Treatment: 4 History of Present Illness HPI Description: The patient is a pleasant 59 year old with a past medical history significant for diabetes, obesity, and hidradenitis. Treated previously in the wound clinic for lower extremity ulcerations, which healed with compression. She has a history of chronic, recurrent hidradenitis involving bilateral axilla and left posterior neck. She has been on and off antibiotics for the past year. Saw Dr. Pat Patrick previously in general surgery for consideration of wide excision. She decided to hold off on surgery. She noted recurrent swelling, redness, and drainage involving both axilla and her left posterior neck in early September 2016. Went to the emergency room and was prescribed  Keflex and doxycycline, which she is taking. Culture from 12/16/2014 grew Enterococcus faecalis, sensitive to ampicillin. Seen at [facility] Oct 2016 and prescribed clindamycin ointment. If no significant improvement, she will consider surgical options. She reports progressive improvement. No significant pain at this time. Minimal purulent drainage from both axilla and left posterior neck. No fever or chills. Electronic Signature(s) Signed: 01/14/2015 8:15:56 AM By: Loletha Grayer MD Entered By: Loletha Grayer on 01/14/2015 08:03:39 Kreg Shropshire ([id number]) -------------------------------------------------------------------------------- Physical Exam Details Patient Name: [name], [name]. Date of Service: 01/13/2015 4:00 PM Medical Record Number: [id number] Patient Account Number: [id number] Date of Birth/Sex: [birth year] (59 y.o. Female) Treating RN: Cornell Barman Primary Care Physician: Denton Lank Other Clinician: Referring Physician: Denton Lank Treating Physician/Extender: BURNS III, WALTER Weeks in Treatment: 4 Constitutional . Pulse regular. Respirations normal and unlabored. Afebrile. [name] Respiratory WNL. No retractions.. Cardiovascular Pedal Pulses WNL. Integumentary (Hair, Skin) .[name] Neurological Sensation normal to touch, pin,and vibration. Psychiatric Judgement and insight Intact.. Oriented times 3.. No evidence of depression, anxiety, or agitation.. Notes Bilateral axillary and left posterior cervical hidradenitis. No undrained abscesses. No cellulitis. Minimal seropurulent drainage. Bilateral calves without ulcerations. 2+ pitting edema. Palpable DP bilaterally. Electronic Signature(s) Signed: 01/14/2015 8:15:56 AM By: Loletha Grayer MD Entered By: Loletha Grayer on 01/14/2015 08:04:54 Kreg Shropshire ([id number]) -------------------------------------------------------------------------------- Physician Orders Details Patient Name: [name], [name]. Date of Service: 01/13/2015 4:00 PM Medical Record Number: [id number] Patient Account Number: [id number] Date of Birth/Sex: [birth year] (59 y.o. Female) Treating RN: Cornell Barman Primary Care Physician: Denton Lank Other Clinician: Referring Physician: Denton Lank Treating Physician/Extender: BURNS III, Charlean Sanfilippo in Treatment: 4 Verbal / Phone Orders: Yes Clinician: Cornell Barman Read Back and Verified: Yes Diagnosis Coding Follow-up Appointments Wound #3 Left Axilla o Other: - As needed Wound #4 Right Axilla o Other: - As needed Wound #6 Neck o Other: - As needed Edema Control Wound #3 Left Axilla o Tubigrip Wound #4  Right Axilla o Tubigrip Wound #6 Neck o Tubigrip Discharge From [facility] Wound #3 Left Axilla o Discharge from [place] - Follow up with [facility] Wound #4 Right Axilla o Discharge from [place] - Follow up with [facility] Wound #6 Neck o Discharge from [place] up with [facility] Electronic Signature(s) Signed: 01/13/2015 4:26:37 PM By: Loletha Grayer MD Signed: 01/13/2015 5:15:26 PM By: Gretta Cool RN, BSN, Kim RN, BSN Entered By: Gretta Cool, RN, BSN, Kim on 01/13/2015 16:11:15 MILANIA, HAUBNER ([id number], SMYTH. ([id number]) -------------------------------------------------------------------------------- Problem List Details Patient Name: [name], [name]. Date of Service: 01/13/2015 4:00 PM Medical Record Number: [id number] Patient Account Number: [id number] Date of Birth/Sex: 1956/02/23 (59 y.o. Female) Treating RN: Cornell Barman Primary Care Physician: Denton Lank Other Clinician: Referring Physician: Denton Lank Treating Physician/Extender: BURNS III, Charlean Sanfilippo in Treatment: 4 Active Problems ICD-10 Encounter Code Description Active Date Diagnosis L73.2 Hidradenitis suppurativa 12/16/2014 Yes E11.622 Type 2 diabetes mellitus with other skin ulcer 12/16/2014 Yes E66.9  Obesity, unspecified 12/16/2014 Yes Inactive Problems Resolved Problems Electronic Signature(s) Signed: 01/14/2015 8:15:56 AM By: Loletha Grayer MD Entered By: Loletha Grayer on 01/14/2015 08:00:55 Kreg Shropshire ([id number]) -------------------------------------------------------------------------------- Progress Note Details Patient Name: BRINLYN, CENA. Date of Service: 01/13/2015 4:00 PM Medical Record Number: [id number] Patient Account Number: [id number] Date of Birth/Sex: 07/09/1955 (59 y.o. Female) Treating RN: Cornell Barman Primary Care Physician: Denton Lank Other Clinician: Referring Physician: Denton Lank Treating Physician/Extender: BURNS III, Charlean Sanfilippo in Treatment: 4 Subjective Chief Complaint Information obtained from Patient Hidradenitis involving bilateral axilla and left posterior cervical area. History of Present Illness (HPI) The patient is a pleasant 59 year old with a past medical history significant for diabetes, obesity, and hidradenitis. Treated previously in the wound clinic for lower extremity ulcerations, which healed with compression. She has a history of chronic, recurrent hidradenitis involving bilateral axilla and left posterior neck. She has been on and off antibiotics for the past year. Saw Dr. Pat Patrick previously in general surgery for consideration of wide excision. She decided to hold off on surgery. She noted recurrent swelling, redness, and drainage involving both axilla and her left posterior neck in early September 2016. Went to the emergency room and was prescribed Keflex and doxycycline, which she is taking. Culture from 12/16/2014 grew Enterococcus faecalis, sensitive to ampicillin. Seen at [facility] Oct 2016 and prescribed clindamycin ointment. If no significant improvement, she will consider surgical options. She reports progressive improvement. No significant pain at this time. Minimal purulent drainage from both axilla and left  posterior neck. No fever or chills. Objective Constitutional Pulse regular. Respirations normal and unlabored. Afebrile. Vitals Time Taken: 3:55 PM, Height: 64 in, Weight: 280 lbs, BMI: 48.1, Temperature: 98.0 F, Pulse: 101 bpm, Respiratory Rate: 18 breaths/min, Blood Pressure: 120/68 mmHg. Respiratory WNL. No retractions.. Cardiovascular Pedal Pulses WNL. NOHEMI, NICKLAUS M. ([id number]) Neurological Sensation normal to touch, pin,and vibration. Psychiatric Judgement and insight Intact.. Oriented times 3.. No evidence of depression, anxiety, or agitation.. General Notes: Bilateral axillary and left posterior cervical hidradenitis. No undrained abscesses. No cellulitis. Minimal seropurulent drainage. Bilateral calves without ulcerations. 2+ pitting edema. Palpable DP bilaterally. Integumentary (Hair, Skin) Wound #3 status is Open. Original cause of wound was Gradually Appeared. The wound is located on the Left Axilla. The wound measures 0.9cm length x 1cm width x 0.1cm depth; 0.707cm^2 area and 0.071cm^3 volume. Wound #4 status is Open. Original cause of wound was Gradually Appeared. The wound is  located on the Right Axilla. The wound measures 0.2cm length x 0.6cm width x 0.1cm depth; 0.094cm^2 area and 0.009cm^3 volume. Wound #5 status is Healed - Epithelialized. Original cause of wound was Trauma. The wound is located on the Right Lower Leg. The wound measures 0cm length x 0cm width x 0cm depth; 0cm^2 area and 0cm^3 volume. Wound #6 status is Open. Original cause of wound was Gradually Appeared. The wound is located on the Neck. The wound measures 0.4cm length x 0.2cm width x 0.1cm depth; 0.063cm^2 area and 0.006cm^3 volume. Assessment Active Problems ICD-10 L73.2 - Hidradenitis suppurativa E11.622 - Type 2 diabetes mellitus with other skin ulcer E66.9 - Obesity, unspecified Bilateral axillary and left posterior cervical hidradenitis. History of lower extremity  ulcerations. TAMEE, BATTIN. ([id number]) Plan Follow-up Appointments: Wound #3 Left Axilla: Other: - As needed Wound #4 Right Axilla: Other: - As needed Wound #6 Neck: Other: - As needed Edema Control: Wound #3 Left Axilla: Tubigrip Wound #4 Right Axilla: Tubigrip Wound #6 Neck: Tubigrip Discharge From [facility]: Wound #3 Left Axilla: Discharge from [place] - Follow up with [facility] Wound #4 Right Axilla: Discharge from [place] - Follow up with [facility] Wound #6 Neck: Discharge from [place] up with [facility] She is planning to follow-up with [facility] after a 1 month course of clindamycin ointment. I will defer to them regarding management of her hidradenitis. Her bilateral lower extremity ulcerations remain healed. I recommended compression stockings daily, 15- 20 mmHg. She was given a pair of Tubigrip's for the meantime. I encouraged her to call at any time with any questions or concerns. Otherwise, we will plan on seeing her back in the wound clinic on a when necessary basis. Electronic Signature(s) Signed: 01/14/2015 8:15:56 AM By: Loletha Grayer MD Entered By: Loletha Grayer on 01/14/2015 08:07:35 LUCIEL, BRICKMAN ([id number] Fuller, Merceda Elks ([id number]) -------------------------------------------------------------------------------- SuperBill Details Patient Name: RAYSHA, TILMON. Date of Service: 01/13/2015 Medical Record Number: [id number] Patient Account Number: [id number] Date of Birth/Sex: [birth year] (59 y.o. Female) Treating RN: Cornell Barman Primary Care Physician: Denton Lank Other Clinician: Referring Physician: Denton Lank Treating Physician/Extender: BURNS III, Charlean Sanfilippo in Treatment: 4 Diagnosis Coding ICD-10 Codes Code Description L73.2 Hidradenitis suppurativa E11.622 Type 2 diabetes mellitus with other skin ulcer E66.9 Obesity, unspecified R60.0 Localized edema Physician  Procedures CPT4 Code: [id number] Description: 96759 - WC PHYS LEVEL 3 - EST PT ICD-10 Description Diagnosis L73.2 Hidradenitis suppurativa R60.0 Localized edema Modifier: Quantity: 1 Electronic Signature(s) Signed: 01/14/2015 8:15:56 AM By: Loletha Grayer MD Entered By: Loletha Grayer on 01/14/2015 16:38:46

## 2015-01-14 NOTE — Progress Notes (Addendum)
[name] [name] ([id number]) Visit Report for 01/13/2015 Arrival Information Details Patient Name: [name] [name], [name] [name]. Date of Service: 01/13/2015 4:00 PM Medical Record Number: [id number] Patient Account Number: [id number] Date of Birth/Sex: [birth year] (59 y.o. Female) Treating RN: Cornell Barman Primary Care Physician: Denton Lank Other Clinician: Referring Physician: Denton Lank Treating Physician/Extender: BURNS III, Charlean Sanfilippo in Treatment: 4 Visit Information History Since Last Visit Added or deleted any medications: Yes Patient Arrived: Ambulatory Any new allergies or adverse reactions: No Arrival Time: 15:53 Had a fall or experienced change in No Accompanied By: self activities of daily living that may affect Transfer Assistance: None risk of falls: Patient Identification Verified: Yes Signs or symptoms of abuse/neglect since last No Secondary Verification Process Yes visito Completed: Hospitalized since last visit: No Patient Requires Transmission-Based No Pain Present Now: No Precautions: Patient Has Alerts: No Electronic Signature(s) Signed: 01/13/2015 5:15:26 PM By: Gretta Cool, RN, BSN, Kim RN, BSN Entered By: Gretta Cool, RN, BSN, Kim on 01/13/2015 15:54:21 Kreg Shropshire ([id number]) -------------------------------------------------------------------------------- Clinic Level of Care Assessment Details Patient Name: [name] [name]. Date of Service: 01/13/2015 4:00 PM Medical Record Number: [id number] Patient Account Number: [id number] Date of Birth/Sex: 18-Oct-1955 (59 y.o. Female) Treating RN: Cornell Barman Primary Care Physician: Denton Lank Other Clinician: Referring Physician: Denton Lank Treating Physician/Extender: BURNS III, Charlean Sanfilippo in Treatment: 4 Clinic Level of Care Assessment Items TOOL 4 Quantity Score []  - Use when only an EandM is performed on FOLLOW-UP visit 0 ASSESSMENTS - Nursing Assessment / Reassessment []  - Reassessment of Co-morbidities  (includes updates in patient status) 0 X - Reassessment of Adherence to Treatment Plan 1 5 ASSESSMENTS - Wound and Skin Assessment / Reassessment X - Simple Wound Assessment / Reassessment - one wound 1 5 []  - Complex Wound Assessment / Reassessment - multiple wounds 0 []  - Dermatologic / Skin Assessment (not related to wound area) 0 ASSESSMENTS - Focused Assessment []  - Circumferential Edema Measurements - multi extremities 0 []  - Nutritional Assessment / Counseling / Intervention 0 []  - Lower Extremity Assessment (monofilament, tuning fork, pulses) 0 []  - Peripheral Arterial Disease Assessment (using hand held doppler) 0 ASSESSMENTS - Ostomy and/or Continence Assessment and Care []  - Incontinence Assessment and Management 0 []  - Ostomy Care Assessment and Management (repouching, etc.) 0 PROCESS - Coordination of Care X - Simple Patient / Family Education for ongoing care 1 15 []  - Complex (extensive) Patient / Family Education for ongoing care 0 []  - Staff obtains Programmer, systems, Records, Test Results / Process Orders 0 []  - Staff telephones HHA, Nursing Homes / Clarify orders / etc 0 []  - Routine Transfer to another Facility (non-emergent condition) 0 MARVELOUS, [place]. ([id number]) []  - Routine Hospital Admission (non-emergent condition) 0 []  - New Admissions / Biomedical engineer / Ordering NPWT, Apligraf, etc. 0 []  - Emergency Hospital Admission (emergent condition) 0 X - Simple Discharge Coordination 1 10 []  - Complex (extensive) Discharge Coordination 0 PROCESS - Special Needs []  - Pediatric / Minor Patient Management 0 []  - Isolation Patient Management 0 []  - Hearing / Language / Visual special needs 0 []  - Assessment of Community assistance (transportation, D/C planning, etc.) 0 []  - Additional assistance / Altered mentation 0 []  - Support Surface(s) Assessment (bed, cushion, seat, etc.) 0 INTERVENTIONS - Wound Cleansing / Measurement X - Simple Wound Cleansing - one wound 1  5 []  - Complex Wound Cleansing - multiple wounds 0 X - Wound Imaging (photographs - any number of wounds) 1 5 []  - Wound  Tracing (instead of photographs) 0 X - Simple Wound Measurement - one wound 1 5 []  - Complex Wound Measurement - multiple wounds 0 INTERVENTIONS - Wound Dressings X - Small Wound Dressing one or multiple wounds 1 10 []  - Medium Wound Dressing one or multiple wounds 0 []  - Large Wound Dressing one or multiple wounds 0 []  - Application of Medications - topical 0 []  - Application of Medications - injection 0 INTERVENTIONS - Miscellaneous []  - External ear exam 0 JARIANA, SHUMARD. ([id number]) []  - Specimen Collection (cultures, biopsies, blood, body fluids, etc.) 0 []  - Specimen(s) / Culture(s) sent or taken to Lab for analysis 0 []  - Patient Transfer (multiple staff / Harrel Lemon Lift / Similar devices) 0 []  - Simple Staple / Suture removal (25 or less) 0 []  - Complex Staple / Suture removal (26 or more) 0 []  - Hypo / Hyperglycemic Management (close monitor of Blood Glucose) 0 []  - Ankle / Brachial Index (ABI) - do not check if billed separately 0 X - Vital Signs 1 5 Has the patient been seen at the hospital within the last three years: Yes Total Score: 65 Level Of Care: New/Established - Level 2 Electronic Signature(s) Signed: 01/14/2015 6:06:11 PM By: Gretta Cool, RN, BSN, Kim RN, BSN Entered By: Gretta Cool, RN, BSN, Kim on 01/14/2015 16:34:50 Kreg Shropshire ([id number]) -------------------------------------------------------------------------------- Encounter Discharge Information Details Patient Name: [name] [name]. Date of Service: 01/13/2015 4:00 PM Medical Record Number: [id number] Patient Account Number: [id number] Date of Birth/Sex: 10/25/1955 (60 y.o. Female) Treating RN: Cornell Barman Primary Care Physician: Denton Lank Other Clinician: Referring Physician: Denton Lank Treating Physician/Extender: BURNS III, Charlean Sanfilippo in Treatment: 4 Encounter Discharge  Information Items Discharge Pain Level: 0 Discharge Condition: Stable Ambulatory Status: Ambulatory Discharge Destination: Home Transportation: Private Auto Accompanied By: self Schedule Follow-up Appointment: No Medication Reconciliation completed and provided to Patient/Care Yes Mathilda Maguire: Provided on Clinical Summary of Care: 01/13/2015 Form Type Recipient Paper Patient DA Electronic Signature(s) Signed: 01/14/2015 6:06:11 PM By: Gretta Cool RN, BSN, Kim RN, BSN Previous Signature: 01/13/2015 4:17:33 PM Version By: Ruthine Dose Entered By: Gretta Cool RN, BSN, Kim on 01/14/2015 16:36:05 Kreg Shropshire ([id number]) -------------------------------------------------------------------------------- Multi Wound Chart Details Patient Name: TIFFONY, KITE. Date of Service: 01/13/2015 4:00 PM Medical Record Number: [id number] Patient Account Number: [id number] Date of Birth/Sex: [birth year] (59 y.o. Female) Treating RN: Cornell Barman Primary Care Physician: Denton Lank Other Clinician: Referring Physician: Denton Lank Treating Physician/Extender: BURNS III, Charlean Sanfilippo in Treatment: 4 Vital Signs Height(in): 64 Pulse(bpm): 101 Weight(lbs): 280 Blood Pressure 120/68 (mmHg): Body Mass Index(BMI): 48 Temperature(F): 98.0 Respiratory Rate 18 (breaths/min): Photos: [3:No Photos] [4:No Photos] [5:No Photos] Wound Location: [3:Left Axilla] [4:Right Axilla] [5:Right Lower Leg] Wounding Event: [3:Gradually Appeared] [4:Gradually Appeared] [5:Trauma] Primary Etiology: [3:Hidradenitis] [4:Hidradenitis] [5:Trauma, Other] Date Acquired: [3:11/17/2014] [4:11/17/2014] [5:12/22/2014] Weeks of Treatment: [3:4] [4:4] [5:3] Wound Status: [3:Open] [4:Open] [5:Healed - Epithelialized] Measurements L x W x D 0.9x1x0.1 [4:0.2x0.6x0.1] [5:0x0x0] (cm) Area (cm) : [3:0.707] [4:0.094] [5:0] Volume (cm) : [3:0.071] [4:0.009] [5:0] % Reduction in Area: [3:83.60%] [4:99.10%] [5:100.00%] % Reduction in  Volume: 83.60% [4:99.20%] [5:100.00%] Classification: [3:Partial Thickness] [4:Partial Thickness] [5:Partial Thickness] Periwound Skin Texture: No Abnormalities Noted [4:No Abnormalities Noted] [5:No Abnormalities Noted] Periwound Skin [3:No Abnormalities Noted] [4:No Abnormalities Noted] [5:No Abnormalities Noted] Moisture: Periwound Skin Color: No Abnormalities Noted [4:No Abnormalities Noted] [5:No Abnormalities Noted] Tenderness on [3:No] [4:No] [5:No] Wound Number: 6 N/A N/A Photos: No Photos N/A N/A Wound Location: Neck N/A N/A Wounding Event: Gradually Appeared N/A N/A  Primary Etiology: Hidradenitis N/A N/A Date Acquired: 01/06/2015 N/A N/A Weeks of Treatment: 0 N/A N/A Wound Status: Open N/A N/A LATOYIA, TECSON. ([id number]) Measurements L x W x D 0.4x0.2x0.1 N/A N/A (cm) Area (cm) : 0.063 N/A N/A Volume (cm) : 0.006 N/A N/A % Reduction in Area: N/A N/A N/A % Reduction in Volume: N/A N/A N/A Classification: N/A N/A N/A Periwound Skin Texture: No Abnormalities Noted N/A N/A Periwound Skin No Abnormalities Noted N/A N/A Moisture: Periwound Skin Color: No Abnormalities Noted N/A N/A Tenderness on No N/A N/A Palpation: Treatment Notes Electronic Signature(s) Signed: 01/13/2015 5:15:26 PM By: Gretta Cool, RN, BSN, Kim RN, BSN Entered By: Gretta Cool, RN, BSN, Kim on 01/13/2015 16:09:22 Kreg Shropshire ([id number]) -------------------------------------------------------------------------------- Labette Plan Details Patient Name: KRISTA, GODSIL. Date of Service: 01/13/2015 4:00 PM Medical Record Number: [id number] Patient Account Number: [id number] Date of Birth/Sex: 02/08/1956 (59 y.o. Female) Treating RN: Cornell Barman Primary Care Physician: Denton Lank Other Clinician: Referring Physician: Denton Lank Treating Physician/Extender: BURNS III, Charlean Sanfilippo in Treatment: 4 Active Inactive Electronic Signature(s) Signed: 01/15/2015 3:54:28 PM By: Gretta Cool RN, BSN,  Kim RN, BSN Previous Signature: 01/13/2015 5:15:26 PM Version By: Gretta Cool RN, BSN, Kim RN, BSN Entered By: Gretta Cool, RN, BSN, Kim on 01/15/2015 09:31:25 Kreg Shropshire ([id number]) -------------------------------------------------------------------------------- Wound Assessment Details Patient Name: SHAKEISHA, HORINE. Date of Service: 01/13/2015 4:00 PM Medical Record Number: [id number] Patient Account Number: [id number] Date of Birth/Sex: 1955-09-27 (59 y.o. Female) Treating RN: Cornell Barman Primary Care Physician: Denton Lank Other Clinician: Referring Physician: Denton Lank Treating Physician/Extender: BURNS III, Charlean Sanfilippo in Treatment: 4 Wound Status Wound Number: 3 Primary Etiology: Hidradenitis Wound Location: Left Axilla Wound Status: Open Wounding Event: Gradually Appeared Date Acquired: 11/17/2014 Weeks Of Treatment: 4 Clustered Wound: No Photos Photo Uploaded By: Gretta Cool, RN, BSN, Kim on 01/13/2015 17:00:10 Wound Measurements Length: (cm) 0.9 Width: (cm) 1 Depth: (cm) 0.1 Area: (cm) 0.707 Volume: (cm) 0.071 % Reduction in Area: 83.6% % Reduction in Volume: 83.6% Wound Description Classification: Partial Thickness Periwound Skin Texture Texture Color No Abnormalities Noted: No No Abnormalities Noted: No Moisture No Abnormalities Noted: No Electronic Signature(s) Signed: 01/13/2015 5:15:26 PM By: Gretta Cool, RN, BSN, Kim RN, BSN [address], [place] ([id number]) Entered By: Gretta Cool, RN, BSN, Kim on 01/13/2015 16:02:58 Kreg Shropshire ([id number]) -------------------------------------------------------------------------------- Wound Assessment Details Patient Name: JANYE, MAYNOR. Date of Service: 01/13/2015 4:00 PM Medical Record Number: [id number] Patient Account Number: [id number] Date of Birth/Sex: [birth year] (58 y.o. Female) Treating RN: Cornell Barman Primary Care Physician: Denton Lank Other Clinician: Referring Physician: Denton Lank Treating Physician/Extender:  BURNS III, Charlean Sanfilippo in Treatment: 4 Wound Status Wound Number: 4 Primary Etiology: Hidradenitis Wound Location: Right Axilla Wound Status: Open Wounding Event: Gradually Appeared Date Acquired: 11/17/2014 Weeks Of Treatment: 4 Clustered Wound: No Photos Photo Uploaded By: Gretta Cool, RN, BSN, Kim on 01/13/2015 17:00:43 Wound Measurements Length: (cm) 0.2 Width: (cm) 0.6 Depth: (cm) 0.1 Area: (cm) 0.094 Volume: (cm) 0.009 % Reduction in Area: 99.1% % Reduction in Volume: 99.2% Wound Description Classification: Partial Thickness Periwound Skin Texture Texture Color No Abnormalities Noted: No No Abnormalities Noted: No Moisture No Abnormalities Noted: No Electronic Signature(s) Signed: 01/13/2015 5:15:26 PM By: Gretta Cool, RN, BSN, Kim RN, BSN [address] [place] ([id number]) Entered By: Gretta Cool, RN, BSN, Kim on 01/13/2015 16:02:58 Kreg Shropshire ([id number]) -------------------------------------------------------------------------------- Wound Assessment Details Patient Name: MARRY, KUSCH. Date of Service: 01/13/2015 4:00 PM Medical Record Number: [id number] Patient Account Number: [id number] Date of Birth/Sex: 1955-12-05 (59 y.o. Female) Treating  RN: Cornell Barman Primary Care Physician: Denton Lank Other Clinician: Referring Physician: Denton Lank Treating Physician/Extender: BURNS III, Charlean Sanfilippo in Treatment: 4 Wound Status Wound Number: 5 Primary Etiology: Trauma, Other Wound Location: Right Lower Leg Wound Status: Healed - Epithelialized Wounding Event: Trauma Date Acquired: 12/22/2014 Weeks Of Treatment: 3 Clustered Wound: No Photos Photo Uploaded By: Gretta Cool, RN, BSN, Kim on 01/13/2015 17:00:44 Wound Measurements Length: (cm) 0 Width: (cm) 0 Depth: (cm) 0 Area: (cm) 0 Volume: (cm) 0 % Reduction in Area: 100% % Reduction in Volume: 100% Wound Description Classification: Partial Thickness Periwound Skin Texture Texture Color No Abnormalities Noted:  No No Abnormalities Noted: No Moisture No Abnormalities Noted: No Electronic Signature(s) Signed: 01/13/2015 5:15:26 PM By: Gretta Cool, RN, BSN, Kim RN, BSN [address], [place] ([id number]) Entered By: Gretta Cool, RN, BSN, Kim on 01/13/2015 15:57:46 Kreg Shropshire ([id number]) -------------------------------------------------------------------------------- Wound Assessment Details Patient Name: AKAYLA, BRASS. Date of Service: 01/13/2015 4:00 PM Medical Record Number: [id number] Patient Account Number: [id number] Date of Birth/Sex: Jun 16, 1955 (59 y.o. Female) Treating RN: Cornell Barman Primary Care Physician: Denton Lank Other Clinician: Referring Physician: Denton Lank Treating Physician/Extender: BURNS III, Charlean Sanfilippo in Treatment: 4 Wound Status Wound Number: 6 Primary Etiology: Hidradenitis Wound Location: Neck Wound Status: Open Wounding Event: Gradually Appeared Date Acquired: 01/06/2015 Weeks Of Treatment: 0 Clustered Wound: No Photos Photo Uploaded By: Gretta Cool, RN, BSN, Kim on 01/13/2015 17:01:26 Wound Measurements Length: (cm) 0.4 % Reduction i Width: (cm) 0.2 % Reduction i Depth: (cm) 0.1 Area: (cm) 0.063 Volume: (cm) 0.006 n Area: n Volume: Periwound Skin Texture Texture Color No Abnormalities Noted: No No Abnormalities Noted: No Moisture No Abnormalities Noted: No Electronic Signature(s) Signed: 01/13/2015 5:15:26 PM By: Gretta Cool, RN, BSN, Kim RN, BSN Entered By: Gretta Cool, RN, BSN, Kim on 01/13/2015 16:02:58 Kreg Shropshire ([id number]) -------------------------------------------------------------------------------- Vitals Details Patient Name: Kreg Shropshire. Date of Service: 01/13/2015 4:00 PM Medical Record Number: [id number] Patient Account Number: [id number] Date of Birth/Sex: 06-Mar-1956 (59 y.o. Female) Treating RN: Cornell Barman Primary Care Physician: Denton Lank Other Clinician: Referring Physician: Denton Lank Treating Physician/Extender: BURNS III,  Charlean Sanfilippo in Treatment: 4 Vital Signs Time Taken: 15:55 Temperature (F): 98.0 Height (in): 64 Pulse (bpm): 101 Weight (lbs): 280 Respiratory Rate (breaths/min): 18 Body Mass Index (BMI): 48.1 Blood Pressure (mmHg): 120/68 Reference Range: 80 - 120 mg / dl Electronic Signature(s) Signed: 01/13/2015 5:15:26 PM By: Gretta Cool, RN, BSN, Kim RN, BSN Entered By: Gretta Cool, RN, BSN, Kim on 01/13/2015 15:56:21

## 2015-01-20 ENCOUNTER — Ambulatory Visit: Payer: Self-pay | Admitting: General Surgery

## 2015-01-20 ENCOUNTER — Other Ambulatory Visit: Payer: Self-pay | Admitting: Family Medicine

## 2015-01-20 DIAGNOSIS — Z1231 Encounter for screening mammogram for malignant neoplasm of breast: Secondary | ICD-10-CM

## 2015-01-25 ENCOUNTER — Ambulatory Visit: Payer: Medicare Other | Admitting: Surgery

## 2015-01-25 ENCOUNTER — Other Ambulatory Visit: Payer: Self-pay | Admitting: *Deleted

## 2015-01-28 ENCOUNTER — Ambulatory Visit
Admission: RE | Admit: 2015-01-28 | Discharge: 2015-01-28 | Disposition: A | Discharge status: Home / Self Care | Payer: Medicare Other | Source: Ambulatory Visit | Attending: Family Medicine | Admitting: Family Medicine

## 2015-01-28 DIAGNOSIS — R921 Mammographic calcification found on diagnostic imaging of breast: Secondary | ICD-10-CM | POA: Insufficient documentation

## 2015-01-28 DIAGNOSIS — Z1231 Encounter for screening mammogram for malignant neoplasm of breast: Secondary | ICD-10-CM

## 2015-02-03 ENCOUNTER — Other Ambulatory Visit: Payer: Self-pay | Admitting: Family Medicine

## 2015-02-03 DIAGNOSIS — N63 Unspecified lump in unspecified breast: Secondary | ICD-10-CM

## 2015-02-03 DIAGNOSIS — R921 Mammographic calcification found on diagnostic imaging of breast: Secondary | ICD-10-CM

## 2015-02-04 ENCOUNTER — Encounter: Payer: Self-pay | Admitting: General Surgery

## 2015-02-04 ENCOUNTER — Ambulatory Visit (INDEPENDENT_AMBULATORY_CARE_PROVIDER_SITE_OTHER): Payer: Medicare Other | Admitting: General Surgery

## 2015-02-04 ENCOUNTER — Other Ambulatory Visit: Payer: Self-pay | Admitting: *Deleted

## 2015-02-04 VITALS — BP 121/62 | HR 95 | Temp 96.9°F | Ht 64.0 in | Wt 290.0 lb

## 2015-02-04 DIAGNOSIS — L732 Hidradenitis suppurativa: Secondary | ICD-10-CM

## 2015-02-04 NOTE — Progress Notes (Signed)
Outpatient Surgical Follow Up  02/04/2015  [name] [name] is an 59 y.o. female.   Chief Complaint  Patient presents with  . Wound Check    Hydradenitis    HPI:  59 year old female returns for follow-up for hydradenitis. Patient reports that the areas are improved from her previous visit. She has noticed some drainage from her left axilla however it is been minimal. She denies any fevers, chills, nausea, vomiting, chest pain, shortness of breath, diarrhea, constipation. She states that last week she had a screening mammogram which showed possible masses in bilateral breast and she is concerned about this. Otherwise she is in her usual state of health and only other complaint is about difficulty sleeping.  Past Medical History  Diagnosis Date  . Diabetes mellitus without complication ([place])   . Thyroid disease   . COPD (chronic obstructive pulmonary disease) ([place])   . Vertigo   . Edema   . Allergic rhinitis   . Degenerative joint disease of knee, left   . Hidradenitis   . IBS (irritable bowel syndrome)   . Depression   . Vitamin D deficiency   . Insomnia   . Sleep apnea   . History of colonic polyps   . Sinusitis, chronic   . Hypertension   . Obesity   . Vaginitis, atrophic   . GERD (gastroesophageal reflux disease)   . Allergy     Past Surgical History  Procedure Laterality Date  . Abdominal hysterectomy    . Cesarean section      x 2  . Cholecystectomy    . Tonsillectomy      Family History  Problem Relation Age of Onset  . Rashes / Skin problems Father   . Hypertension Father   . Breast cancer Paternal Grandmother     Social History:  reports that she has quit smoking. She has never used smokeless tobacco. She reports that she does not drink alcohol or use illicit drugs.  Allergies:  Allergies  Allergen Reactions  . Codeine Itching    Medications reviewed.    ROS   multipoint review of systems was completed. All pertinent positives negatives in  history of present illness remain were negative.  BP 121/62 mmHg  Pulse 95  Temp(Src) 96.9 F (36.1 C) (Oral)  Ht 5\' 4"  (1.626 m)  Wt 131.543 kg (290 lb)  BMI 49.75 kg/m2  Physical Exam   Gen.: No acute distress  chest: Clear to auscultation  heart: Regular rate and rhythm Abdomen: Soft, nontender, nondistended  skin: hydradenitis present bilateral axilla. Minimal drainage from left axillary hidradenitis. Single linear hidradenitis scars present under the axilla. Larger area of hidradenitis scar to the left posterior neck. No active drainage or abscess from any of the sites.   No results found for this or any previous visit (from the past 48 hour(s)). No results found.  Assessment/Plan:  1. Hidradenitis  discussed with the patient that her hidradenitis looks stable at this time. Given the largest area of hidradenitis is a posterior neck she desires excision could potentially resect this next month. She is getting additional views of the breast mass seen on her screening mammogram in 2 weeks. Discussed we'll see her back in clinic in 3 weeks to discuss her mammographic findings and to discuss whether or not we should surgically excise any further areas of hidradenitis. She'll continue her topical antibiotic treatment until then.     Clayburn Pert  02/04/2015,negative

## 2015-02-04 NOTE — Patient Instructions (Signed)
We would like you to follow-up in 3 weeks to go over your Mammogram results.

## 2015-02-19 ENCOUNTER — Ambulatory Visit
Admission: RE | Admit: 2015-02-19 | Discharge: 2015-02-19 | Disposition: A | Discharge status: Home / Self Care | Payer: Medicare Other | Source: Ambulatory Visit | Attending: Family Medicine | Admitting: Family Medicine

## 2015-02-19 DIAGNOSIS — N6001 Solitary cyst of right breast: Secondary | ICD-10-CM | POA: Insufficient documentation

## 2015-02-19 DIAGNOSIS — R921 Mammographic calcification found on diagnostic imaging of breast: Secondary | ICD-10-CM

## 2015-02-19 DIAGNOSIS — N6002 Solitary cyst of left breast: Secondary | ICD-10-CM | POA: Insufficient documentation

## 2015-02-19 DIAGNOSIS — N63 Unspecified lump in unspecified breast: Secondary | ICD-10-CM

## 2015-02-23 ENCOUNTER — Encounter: Payer: Self-pay | Admitting: Family Medicine

## 2015-02-25 ENCOUNTER — Encounter: Payer: Self-pay | Admitting: General Surgery

## 2015-02-25 ENCOUNTER — Ambulatory Visit (INDEPENDENT_AMBULATORY_CARE_PROVIDER_SITE_OTHER): Payer: Medicare Other | Admitting: General Surgery

## 2015-02-25 VITALS — BP 106/54 | HR 91 | Temp 98.3°F | Ht 63.0 in | Wt 288.0 lb

## 2015-02-25 DIAGNOSIS — R928 Other abnormal and inconclusive findings on diagnostic imaging of breast: Secondary | ICD-10-CM

## 2015-02-25 DIAGNOSIS — L732 Hidradenitis suppurativa: Secondary | ICD-10-CM

## 2015-02-25 NOTE — Patient Instructions (Signed)
Please give [place] a call before your scheduled appointment if you see that your hidradenitis get worse.

## 2015-02-25 NOTE — Progress Notes (Signed)
Outpatient Surgical Follow Up  02/25/2015  [name] [name] is an 59 y.o. female.   Chief Complaint  Patient presents with  . Other    Wound check and review Mammogram    HPI: Patient returns to clinic for follow up of mammograms and hidradenitis. She states she already knows her mammogram results and that they are benign with need for repeat images in 6 months. She states that her hidradenitis is actually improved since she was last seen. Decreased pain, drainage and pressure. She is happy with her current treatment and does not want to pursue surgical intervention at least until after the holidays. Denies any fevers, chills, nausea, vomiting, or changes in bowel habits.  Past Medical History  Diagnosis Date  . Diabetes mellitus without complication ([place])   . Thyroid disease   . COPD (chronic obstructive pulmonary disease) ([place])   . Vertigo   . Edema   . Allergic rhinitis   . Degenerative joint disease of knee, left   . Hidradenitis   . IBS (irritable bowel syndrome)   . Depression   . Vitamin D deficiency   . Insomnia   . Sleep apnea   . History of colonic polyps   . Sinusitis, chronic   . Hypertension   . Obesity   . Vaginitis, atrophic   . GERD (gastroesophageal reflux disease)   . Allergy     Past Surgical History  Procedure Laterality Date  . Abdominal hysterectomy    . Cesarean section      x 2  . Cholecystectomy    . Tonsillectomy      Family History  Problem Relation Age of Onset  . Rashes / Skin problems Father   . Hypertension Father   . Breast cancer Paternal Grandmother     Social History:  reports that she has quit smoking. She has never used smokeless tobacco. She reports that she does not drink alcohol or use illicit drugs.  Allergies:  Allergies  Allergen Reactions  . Codeine Itching    Medications reviewed.    ROS  A multi-system ROS was completed. All pertinent findings documented in the HPI, the remainder were negative.  BP 106/54  mmHg  Pulse 91  Temp(Src) 98.3 F (36.8 C) (Oral)  Ht 5\' 3"  (1.6 m)  Wt 130.636 kg (288 lb)  BMI 51.03 kg/m2  Physical Exam  Gen : NAD Resp: CTA CV: RRR ABD: Soft, NT Skin: Bilateral axilla with improved hidradenitis without drainage, posterior left neck with stable findings of raised hidradenitis Breast: Unchanged from last visit, exam deferred at this time   No results found for this or any previous visit (from the past 53 hour(s)). No results found. Mammography reviewed. BIRADS 3 - likely benign Assessment/Plan:  1. Hidradenitis Stable and improved from last visit. Will plan to check areas again in January. IF stable or improved at that visit will continue medical treatment. No urgent surgical intervention needed.  2. Abnormal mammogram of both breasts BIRADS 3 - repeat imaging in 3 months. Results discussed in detail with patient and she voiced understanding. Will follow clinically.     Clayburn Pert, MD FACS General Surgeon  02/25/2015,7:50 PM

## 2015-03-31 ENCOUNTER — Ambulatory Visit: Payer: Self-pay | Admitting: General Surgery

## 2015-04-01 ENCOUNTER — Ambulatory Visit (INDEPENDENT_AMBULATORY_CARE_PROVIDER_SITE_OTHER): Payer: Commercial Managed Care - HMO | Admitting: General Surgery

## 2015-04-01 ENCOUNTER — Encounter: Payer: Self-pay | Admitting: General Surgery

## 2015-04-01 VITALS — BP 109/71 | HR 88 | Temp 98.0°F | Wt 288.0 lb

## 2015-04-01 DIAGNOSIS — L732 Hidradenitis suppurativa: Secondary | ICD-10-CM

## 2015-04-01 NOTE — Progress Notes (Signed)
Outpatient Surgical Follow Up  04/01/2015  [name] [name] is an 60 y.o. female.   Chief Complaint  Patient presents with  . Follow-up    Hidradenitis    HPI:  60 year old female presents for continued follow-up of her multiple areas of hidradenitis. Patient states that she was seen lost the area of her left posterior neck has opened up and bled , drained at least 3 times. She states currently it looks and feels better than it has before. Areas underneath her bilateral axillas continue to improve she has no real complaints of this right now. Patient denies any fevers, chills, nausea, vomiting, diarrhea, constipation, chest pain, shortness of breath.  Past Medical History  Diagnosis Date  . Diabetes mellitus without complication ([place])   . Thyroid disease   . COPD (chronic obstructive pulmonary disease) ([place])   . Vertigo   . Edema   . Allergic rhinitis   . Degenerative joint disease of knee, left   . Hidradenitis   . IBS (irritable bowel syndrome)   . Depression   . Vitamin D deficiency   . Insomnia   . Sleep apnea   . History of colonic polyps   . Sinusitis, chronic   . Hypertension   . Obesity   . Vaginitis, atrophic   . GERD (gastroesophageal reflux disease)   . Allergy     Past Surgical History  Procedure Laterality Date  . Abdominal hysterectomy    . Cesarean section      x 2  . Cholecystectomy    . Tonsillectomy      Family History  Problem Relation Age of Onset  . Rashes / Skin problems Father   . Hypertension Father   . Breast cancer Paternal Grandmother     Social History:  reports that she has quit smoking. She has never used smokeless tobacco. She reports that she does not drink alcohol or use illicit drugs.  Allergies:  Allergies  Allergen Reactions  . Codeine Itching    Medications reviewed.    ROS  a multipoint review of systems was completed, all pertinent positives and negatives within the history of present illness remainder  negative.   BP 109/71 mmHg  Pulse 88  Temp(Src) 98 F (36.7 C) (Oral)  Wt 130.636 kg (288 lb)  Physical Exam  Gen.: No acute distress Chest: Clear to auscultation Heart: Regular rate and rhythm Abdomen: Soft, nontender, nondistended with active bowel sounds Skin: Multiple areas of hidradenitis , bilateral axillas without any evidence of active drainage and much improved from previous exams. Left posterior neck with continued tract of hidradenitis that also appears improved from last visit but with evidence of recent drainage.    No results found for this or any previous visit (from the past 48 hour(s)). No results found.  Assessment/Plan:  1. Hidradenitis  60 year old female with continued hidradenitis. Discussed at length that if she was ready we could perform a local excision of the area on her posterior neck in the clinic. Patient voiced interest in this and has decided she would like to have this done. Given time constraints of the current visit we will reschedule her a visit for a procedure and plan for a local excision of her left posterior neck hidradenitis with primary closure.     Clayburn Pert, MD FACS General Surgeon  04/01/2015,4:31 PM

## 2015-04-01 NOTE — Patient Instructions (Signed)
Your I&D will be scheduled on 04/12/2015 at 3:00 PM.

## 2015-04-12 ENCOUNTER — Encounter: Payer: Self-pay | Admitting: General Surgery

## 2015-04-12 ENCOUNTER — Ambulatory Visit (INDEPENDENT_AMBULATORY_CARE_PROVIDER_SITE_OTHER): Payer: Medicare Other | Admitting: General Surgery

## 2015-04-12 VITALS — BP 111/67 | HR 81 | Temp 98.0°F | Ht 63.0 in | Wt 285.5 lb

## 2015-04-12 DIAGNOSIS — N611 Abscess of the breast and nipple: Secondary | ICD-10-CM

## 2015-04-12 DIAGNOSIS — L732 Hidradenitis suppurativa: Secondary | ICD-10-CM

## 2015-04-12 MED ORDER — DOXYCYCLINE HYCLATE 50 MG PO CAPS: 50.0000 mg | ORAL_CAPSULE | Freq: Two times a day (BID) | ORAL | Status: DC

## 2015-04-12 NOTE — Patient Instructions (Signed)
We have cut open and drained your abscess on your left breast today. Please remove about 2 inches of the packing daily until all of this comes out.   We have sent an antibiotic (doxycycline) to your pharmacy at this time.   We will see you back in 3 weeks in the [place] office.   If you have any questions or concerns, please call our office and speak with a nurse.  If you develop any increasing redness, swelling, or fever; call immediately.

## 2015-04-12 NOTE — Progress Notes (Signed)
Outpatient Surgical Follow Up  04/12/2015  [name] [name] is an 60 y.o. female.   Chief Complaint  Patient presents with  . Abscess    Left Lateral Breast    HPI: Patient presents to clinic today for planned resection of left posterior neck hidradenitis tract. However upon presentation she tells [place] about a painful area to the left breast that has had persistent redness and fluctuance. She denies any current fevers, chills, nausea, vomiting. She does state there is a left breast is persistently very painful. She also has new areas of drainage to her left groin in addition to her chronic draining areas from her bilateral axilla.  Past Medical History  Diagnosis Date  . Diabetes mellitus without complication ([place])   . Thyroid disease   . COPD (chronic obstructive pulmonary disease) ([place])   . Vertigo   . Edema   . Allergic rhinitis   . Degenerative joint disease of knee, left   . Hidradenitis   . IBS (irritable bowel syndrome)   . Depression   . Vitamin D deficiency   . Insomnia   . Sleep apnea   . History of colonic polyps   . Sinusitis, chronic   . Hypertension   . Obesity   . Vaginitis, atrophic   . GERD (gastroesophageal reflux disease)   . Allergy     Past Surgical History  Procedure Laterality Date  . Abdominal hysterectomy    . Cesarean section      x 2  . Cholecystectomy    . Tonsillectomy      Family History  Problem Relation Age of Onset  . Rashes / Skin problems Father   . Hypertension Father   . Breast cancer Paternal Grandmother     Social History:  reports that she has quit smoking. She has never used smokeless tobacco. She reports that she does not drink alcohol or use illicit drugs.  Allergies:  Allergies  Allergen Reactions  . Codeine Itching    Medications reviewed.    ROS  A multipoint review of systems was completed, all pertinent positives and negatives were documented in the history of present illness and remainder negative.  BP  111/67 mmHg  Pulse 81  Temp(Src) 98 F (36.7 C) (Oral)  Ht 5\' 3"  (1.6 m)  Wt 129.502 kg (285 lb 8 oz)  BMI 50.59 kg/m2  Physical Exam  Gen.: No acute distress Chest: Clear to auscultation Heart: Regular rate and rhythm Breast: Left breast with a 3 x 4 cm area of fluctuance with spreading erythema from this consistent with abscess area and this is located at the 3:00 position approximately 2 cm from her nipple areolar complex. Abdomen: Soft, nontender, nondistended Skin: Multiple areas of hidradenitis. Left posterior neck, bilateral axilla, left groin. No active drainage seen at any of the sites today.   No results found for this or any previous visit (from the past 48 hour(s)). No results found.  Assessment/Plan:  1. Hidradenitis 60 year old female with continued, chronic hidradenitis. Plans for excision of the neck area aborted today secondary to active infection to left breast.  2. Left breast abscess Due to the active infection of the left breast patient underwent an incision and drainage of left breast abscess in lieu of excision of hidradenitis. A consent was obtained prior to the procedure. The area was sterilized with Betadine, localized with a 1% lidocaine with epinephrine solution, incised with a 15 blade scalpel. Copious amounts of seropurulent fluid were expressed. Entire area was [employer]  until the Irrigation Returned Clear. The Wound Was Then Packed with Iodoform Gauze and Dressed with Plain Gauze and Paper Tape. Discussed Wound Care with the Patient. She'll Follow-Up in Clinic in 2-3 Weeks for Additional Wound Check.     Clayburn Pert, MD FACS General Surgeon  04/12/2015,6:13 PM

## 2015-04-28 ENCOUNTER — Emergency Department
Admission: EM | Admit: 2015-04-28 | Discharge: 2015-04-28 | Disposition: A | Discharge status: Home / Self Care | Payer: Medicare Other | Attending: Emergency Medicine | Admitting: Emergency Medicine

## 2015-04-28 ENCOUNTER — Encounter: Payer: Self-pay | Admitting: *Deleted

## 2015-04-28 DIAGNOSIS — E119 Type 2 diabetes mellitus without complications: Secondary | ICD-10-CM | POA: Insufficient documentation

## 2015-04-28 DIAGNOSIS — I10 Essential (primary) hypertension: Secondary | ICD-10-CM | POA: Insufficient documentation

## 2015-04-28 DIAGNOSIS — Z7951 Long term (current) use of inhaled steroids: Secondary | ICD-10-CM | POA: Insufficient documentation

## 2015-04-28 DIAGNOSIS — Z87891 Personal history of nicotine dependence: Secondary | ICD-10-CM | POA: Insufficient documentation

## 2015-04-28 DIAGNOSIS — Z79899 Other long term (current) drug therapy: Secondary | ICD-10-CM | POA: Insufficient documentation

## 2015-04-28 DIAGNOSIS — Z792 Long term (current) use of antibiotics: Secondary | ICD-10-CM | POA: Insufficient documentation

## 2015-04-28 DIAGNOSIS — M778 Other enthesopathies, not elsewhere classified: Secondary | ICD-10-CM

## 2015-04-28 DIAGNOSIS — M779 Enthesopathy, unspecified: Secondary | ICD-10-CM | POA: Diagnosis not present

## 2015-04-28 DIAGNOSIS — M79602 Pain in left arm: Secondary | ICD-10-CM | POA: Diagnosis present

## 2015-04-28 MED ORDER — ETODOLAC 400 MG PO TABS: 400.0000 mg | ORAL_TABLET | Freq: Two times a day (BID) | ORAL | Status: DC

## 2015-04-28 NOTE — ED Notes (Addendum)
States left arm pain that began in her pinky and has now gone up her arm to her elbow, states "my bone hurts", denies any injury

## 2015-04-28 NOTE — Discharge Instructions (Signed)
In taking etodolac twice a day with food. Follow-up with your primary care doctor if any continued problems. You may use ice or heat to this area to see if it relieves some of the discomfort. This most likely will take 1 week to resolve so continue taking her medicine daily.

## 2015-04-28 NOTE — ED Provider Notes (Signed)
[facility] Emergency Department Provider Note  ____________________________________________  Time seen: Approximately 1:06 PM  I have reviewed the triage vital signs and the nursing notes.   HISTORY  Chief Complaint Arm Pain   HPI CITLALY CAMPLIN is a 60 y.o. female is here with complaint of pain that began in her left fifth finger and his traveled up her hand to her elbow. Patient states that now when she makes a fist or moves her elbow she has pain. She has pain on exertion. She denies any injury to her arm. She is occasionally taken a ibuprofen but not consistently. Patient is not doing any repetitive motion.  She rates her pain as 10 out of 10   Past Medical History  Diagnosis Date  . Diabetes mellitus without complication ([place])   . Thyroid disease   . COPD (chronic obstructive pulmonary disease) ([place])   . Vertigo   . Edema   . Allergic rhinitis   . Degenerative joint disease of knee, left   . Hidradenitis   . IBS (irritable bowel syndrome)   . Depression   . Vitamin D deficiency   . Insomnia   . Sleep apnea   . History of colonic polyps   . Sinusitis, chronic   . Hypertension   . Obesity   . Vaginitis, atrophic   . GERD (gastroesophageal reflux disease)   . Allergy     Patient Active Problem List   Diagnosis Date Noted  . Abnormal mammogram of both breasts 02/25/2015  . Hidradenitis 12/30/2014  . Diabetes ([place]) 12/30/2014  . Essential hypertension 12/30/2014  . Asthma 12/30/2014    Past Surgical History  Procedure Laterality Date  . Abdominal hysterectomy    . Cesarean section      x 2  . Cholecystectomy    . Tonsillectomy      Current Outpatient Rx  Name  Route  Sig  Dispense  Refill  . ACCU-CHEK AVIVA PLUS test strip      2 (two) times daily. for testing      0     Dispense as written.   [name] albuterol (PROVENTIL HFA;VENTOLIN HFA) 108 (90 BASE) MCG/ACT inhaler   Inhalation   Inhale 2 puffs into the lungs every 4  (four) hours as needed for wheezing or shortness of breath.         [name] albuterol (PROVENTIL) (2.5 MG/3ML) 0.083% nebulizer solution   Nebulization   Take 2.5 mg by nebulization every 4 (four) hours as needed for wheezing or shortness of breath.         [name] atorvastatin (LIPITOR) 20 MG tablet   Oral   Take 20 mg by mouth daily at 6 PM.         . B-D UF III MINI PEN NEEDLES 31G X 5 MM MISC      use twice a day for INJECTIONS      1     Dispense as written.   . Blood Glucose Monitoring Suppl (ACCU-CHEK AVIVA PLUS) W/DEVICE KIT      CHECK BLOOD SUGAR TWICE A DAY      0   . clindamycin (CLEOCIN-T) 1 % external solution      Apply to affected area 2 times daily   60 mL   3   . doxepin (SINEQUAN) 50 MG capsule   Oral   Take 50 mg by mouth at bedtime.         [name] doxycycline (VIBRAMYCIN) 50 MG capsule  Oral   Take 1 capsule (50 mg total) by mouth 2 (two) times daily.   28 capsule   0   . DULoxetine (CYMBALTA) 20 MG capsule   Oral   Take 20 mg by mouth daily.         [name] etodolac (LODINE) 400 MG tablet   Oral   Take 1 tablet (400 mg total) by mouth 2 (two) times daily.   20 tablet   0   . Fluticasone-Salmeterol (ADVAIR DISKUS) 250-50 MCG/DOSE AEPB   Inhalation   Inhale 1 puff into the lungs daily as needed.          . furosemide (LASIX) 40 MG tablet   Oral   Take 40 mg by mouth 2 (two) times daily.         [name] gabapentin (NEURONTIN) 100 MG capsule   Oral   Take 200 mg by mouth at bedtime.          [name] HUMULIN R U-500 KWIKPEN 500 UNIT/ML injection   Subcutaneous   Inject 120 Units into the skin 2 (two) times daily with a meal.       1     Dispense as written.   [name] levothyroxine (SYNTHROID, LEVOTHROID) 125 MCG tablet   Oral   Take 125 mcg by mouth daily before breakfast.          . lisinopril-hydrochlorothiazide (PRINZIDE,ZESTORETIC) 10-12.5 MG per tablet   Oral   Take 1 tablet by mouth daily.         [name] loratadine (CLARITIN) 10 MG tablet    Oral   Take 10 mg by mouth daily.         . meclizine (ANTIVERT) 25 MG tablet   Oral   Take 25 mg by mouth 2 (two) times daily as needed for dizziness.          . mometasone (NASONEX) 50 MCG/ACT nasal spray   Nasal   Place 2 sprays into the nose daily.         . montelukast (SINGULAIR) 10 MG tablet   Oral   Take 1 tablet by mouth.      1   . olopatadine (PATANOL) 0.1 % ophthalmic solution   Both Eyes   Place 1 drop into both eyes 2 (two) times daily.         . ondansetron (ZOFRAN) 8 MG tablet      TAKE 1/2 TO 1 TABLET BY MOUTH EVERY 8 HRS AS NEEDED FOR NAUSEA AND VOMITTING      0   . pantoprazole (PROTONIX) 40 MG tablet      take 1 tablet by mouth once daily for REFLUX/HEARTBURN      1   . potassium chloride (K-DUR) 10 MEQ tablet   Oral   Take 10 mEq by mouth once.       1   . TRADJENTA 5 MG TABS tablet   Oral   Take 5 mg by mouth daily.       1     Dispense as written.   . traZODone (DESYREL) 50 MG tablet   Oral   Take 50 mg by mouth at bedtime. 1-2 by mouth nightly at bedtime to help sleep           Allergies Codeine  Family History  Problem Relation Age of Onset  . Rashes / Skin problems Father   . Hypertension Father   . Breast cancer Paternal Grandmother     Social History  Social History  Substance Use Topics  . Smoking status: Former Research scientist (life sciences)  . Smokeless tobacco: Never Used  . Alcohol Use: No    Review of Systems Constitutional: No fever/chills ENT: No sore throat. Cardiovascular: Denies chest pain. Respiratory: Denies shortness of breath. Gastrointestinal: No abdominal pain.  No nausea, no vomiting.  No diarrhea.  No constipation. Genitourinary: Negative for dysuria. Musculoskeletal: Negative for back pain. Positive left arm pain. Skin: Negative for rash. Neurological: Negative for headaches, focal weakness or numbness.  10-point ROS otherwise negative.  ____________________________________________   PHYSICAL  EXAM:  VITAL SIGNS: ED Triage Vitals  Enc Vitals Group     BP 04/28/15 1258 128/57 mmHg     Pulse Rate 04/28/15 1258 92     Resp 04/28/15 1258 18     Temp 04/28/15 1258 98.2 F (36.8 C)     Temp Source 04/28/15 1258 Oral     SpO2 04/28/15 1258 99 %     Weight 04/28/15 1258 288 lb (130.636 kg)     Height 04/28/15 1258 5' 3" (1.6 m)     Head Cir --      Peak Flow --      Pain Score 04/28/15 1259 10     Pain Loc --      Pain Edu? --      Excl. in [place]? --     Constitutional: Alert and oriented. Well appearing and in no acute distress. Eyes: Conjunctivae are normal. PERRL. EOMI. Head: Atraumatic. Nose: No congestion/rhinnorhea. Neck: No stridor.  Nontender cervical spine to palpation. Cardiovascular: Normal rate, regular rhythm. Grossly normal heart sounds.  Good peripheral circulation. Respiratory: Normal respiratory effort.  No retractions. Lungs CTAB. Gastrointestinal: Soft and nontender. No distention.  Musculoskeletal: Left hand and forearm there is no gross deformity. There is moderate tenderness on palpation of the left lateral epicondylar area. There is increased range of motion with exertion of the left wrist especially in flexion. No gross deformity. Range of motion is decreased due to pain. Patient is able make a fist without any difficulty. Neurologic:  Normal speech and language. No gross focal neurologic deficits are appreciated. No gait instability. Skin:  Skin is warm, dry and intact. No rash noted. Psychiatric: Mood and affect are normal. Speech and behavior are normal.  ____________________________________________   LABS (all labs ordered are listed, but only abnormal results are displayed)  Labs Reviewed - No data to display  PROCEDURES  Procedure(s) performed: None  Critical Care performed: No  ____________________________________________   INITIAL IMPRESSION / ASSESSMENT AND PLAN / ED COURSE  Pertinent labs & imaging results that were available  during my care of the patient were reviewed by me and considered in my medical decision making (see chart for details).  Patient was treated for tenosynovitis and she was started on etodolac 400 mg twice a day with food. Patient is to follow-up with her primary care doctor if any continued problems. ____________________________________________   FINAL CLINICAL IMPRESSION(S) / ED DIAGNOSES  Final diagnoses:  Forearm tendonitis      Johnn Hai, PA-C 04/28/15 1606  Earleen Newport, MD 04/28/15 [phone]

## 2015-05-05 ENCOUNTER — Ambulatory Visit: Payer: Self-pay | Admitting: General Surgery

## 2015-05-20 ENCOUNTER — Telehealth: Payer: Self-pay | Admitting: General Surgery

## 2015-05-20 NOTE — Telephone Encounter (Signed)
Returned phone call to patient at this time. Patient had an I&D of a Left Lateral Breast Abscess on 04/12/15 by Dr. Adonis Huguenin. Patient was supposed to come back for wound check 2-3 weeks later and never followed up. She called today with concerns that she is having an allergic reaction to the tape she is placing on the dressing. After triaging patient, this area is still draining moderate amounts of purulent drainage every day. I explained to the patient that she would need seen by a surgeon to look at this area.   Appointment made with Dr. Burt Knack next week.

## 2015-05-20 NOTE — Telephone Encounter (Signed)
Patient thinks that she is allergic to the tape that is covering her incision on the breast. She stated that it is raw. She would like advise on what other type of bandage she can use. Please contact patient at [phone].

## 2015-05-26 ENCOUNTER — Other Ambulatory Visit: Payer: Self-pay

## 2015-05-27 ENCOUNTER — Ambulatory Visit (INDEPENDENT_AMBULATORY_CARE_PROVIDER_SITE_OTHER): Payer: Medicaid Other | Admitting: Surgery

## 2015-05-27 ENCOUNTER — Encounter: Payer: Self-pay | Admitting: Surgery

## 2015-05-27 VITALS — BP 101/61 | HR 78 | Temp 98.1°F

## 2015-05-27 DIAGNOSIS — N611 Abscess of the breast and nipple: Secondary | ICD-10-CM

## 2015-05-27 MED ORDER — CEPHALEXIN 250 MG PO CAPS: 250.0000 mg | ORAL_CAPSULE | Freq: Four times a day (QID) | ORAL | Status: DC

## 2015-05-27 MED ORDER — CEPHALEXIN 500 MG PO CAPS: 500.0000 mg | ORAL_CAPSULE | Freq: Four times a day (QID) | ORAL | Status: DC

## 2015-05-27 NOTE — Progress Notes (Signed)
Outpatient Surgical Follow Up  05/27/2015  [name] [name] is an 60 y.o. female.   [id number] breast drainage  HPI: this patient with left breast drainage following an I&D of an abscess for which she never followed up with a surgeon. It's been draining since the I&D and she is keeping a dressing on it and she has no fevers or chills  Past Medical History  Diagnosis Date  . Diabetes mellitus without complication ([place])   . Thyroid disease   . COPD (chronic obstructive pulmonary disease) ([place])   . Vertigo   . Edema   . Allergic rhinitis   . Degenerative joint disease of knee, left   . Hidradenitis   . IBS (irritable bowel syndrome)   . Depression   . Vitamin D deficiency   . Insomnia   . Sleep apnea   . History of colonic polyps   . Sinusitis, chronic   . Hypertension   . Obesity   . Vaginitis, atrophic   . GERD (gastroesophageal reflux disease)   . Allergy     Past Surgical History  Procedure Laterality Date  . Abdominal hysterectomy    . Cesarean section      x 2  . Cholecystectomy    . Tonsillectomy      Family History  Problem Relation Age of Onset  . Rashes / Skin problems Father   . Hypertension Father   . Breast cancer Paternal Grandmother     Social History:  reports that she has quit smoking. She has never used smokeless tobacco. She reports that she does not drink alcohol or use illicit drugs.  Allergies:  Allergies  Allergen Reactions  . Codeine Itching    Medications reviewed.   Review of Systems:   Review of Systems  Constitutional: Negative for fever and chills.  HENT: Negative.   Eyes: Negative.   Respiratory: Negative.   Cardiovascular: Negative.   Gastrointestinal: Negative.   Genitourinary: Negative.   Musculoskeletal: Negative.   Skin: Negative.   Neurological: Negative.   Endo/Heme/Allergies: Negative.   Psychiatric/Behavioral: Negative.      Physical Exam:  BP 101/61 mmHg  Pulse 78  Temp(Src) 98.1 F (36.7 C)  (Oral)  Physical Exam  Constitutional: No distress.  Morbid obesity, wheelchair  HENT:  Head: Normocephalic and atraumatic.  Eyes: Pupils are equal, round, and reactive to light. Right eye exhibits no discharge. Left eye exhibits no discharge. No scleral icterus.  Skin: Skin is warm and dry. No rash noted. She is not diaphoretic. No erythema.  Left breast wound without erythema, some serous fluid present, silver nitrate applied  Vitals reviewed.     No results found for this or any previous visit (from the past 48 hour(s)). No results found.  Assessment/Plan:  No sign of active abscess but there is an open wound that has some minimal seropurulent material on it which is treated with silver nitrate and I will restart her on some Keflex because of the prior enterococcus cultures that were obtained and she'll follow-up next week  Florene Glen, MD, FACS

## 2015-05-31 ENCOUNTER — Telehealth: Payer: Self-pay | Admitting: General Surgery

## 2015-05-31 MED ORDER — SULFAMETHOXAZOLE-TRIMETHOPRIM 800-160 MG PO TABS: 1.0000 | ORAL_TABLET | Freq: Two times a day (BID) | ORAL | Status: DC

## 2015-05-31 MED ORDER — DOXYCYCLINE HYCLATE 50 MG PO CAPS: 50.0000 mg | ORAL_CAPSULE | Freq: Two times a day (BID) | ORAL | Status: DC

## 2015-05-31 NOTE — Telephone Encounter (Signed)
Patient saw Burt Knack on Thursday 3/9. She has a left breast abscess that started draining pus and blood over the weekend. She states on Saturday she had to change the dressing 8 times. She started taking her Keflex Thursday evening (3/9) It made her very sick and also caused diarrhea till she started bleeding from her rectum. She stopped taking the Keflex Saturday. She would like to speak with the nurse.

## 2015-05-31 NOTE — Telephone Encounter (Signed)
Spoke with Dr. Dahlia Byes regarding this patient. We have changed antibiotic to Bactrim due to patient's vomiting. This should get the abscess better under control and make drainage much less. Patient denies fever and any further vomiting or nausea since stopping the Keflex.  Upon speaking to the patient, she cannot take Bactrim either as this upsets her stomach causing diarrhea, nausea, and vomiting. Patient states that Doxycycline is what has worked best for this in the past. This was sent to pharmacy. Patient still encouraged to come back in this week as scheduled with Dr. Azalee Course.

## 2015-06-02 ENCOUNTER — Ambulatory Visit (INDEPENDENT_AMBULATORY_CARE_PROVIDER_SITE_OTHER): Payer: Commercial Managed Care - HMO | Admitting: Surgery

## 2015-06-02 ENCOUNTER — Encounter: Payer: Self-pay | Admitting: Surgery

## 2015-06-02 VITALS — BP 105/63 | HR 87 | Temp 98.2°F | Ht 63.0 in | Wt 288.0 lb

## 2015-06-02 DIAGNOSIS — N611 Abscess of the breast and nipple: Secondary | ICD-10-CM | POA: Insufficient documentation

## 2015-06-02 NOTE — Patient Instructions (Signed)
We will have you follow-up with Dr. Adonis Huguenin as scheduled below.  Please call with any questions or concerns.  You may stop your antibiotics.

## 2015-06-02 NOTE — Progress Notes (Signed)
Mrs. [name] [name] following up for left breast abscess status post I&D. He has completed antibiotics and is doing fine. Apparently this was resolve an injury after diagnostic mammogram in the past. No Fevers, no chills minimal drainage.  PE : Female no acute distress. She comes in a wheelchair  Breast: There is a small 1 cm x 5 mm wound on the left breast with good granulation tissue almost completely healed. No evidence of erythema no evidence of induration. No evidence of any masses axilla no lymphadenopathy   A/p Resolving breast abscess and. Continue local wound care and she will follow up in 1 month for hidradenitis and skin and soft tissue issues on the left neck with Dr. Adonis Huguenin

## 2015-06-09 ENCOUNTER — Ambulatory Visit: Payer: Medicare Other | Admitting: General Surgery

## 2015-06-10 ENCOUNTER — Ambulatory Visit: Payer: Self-pay | Admitting: Surgery

## 2015-06-24 ENCOUNTER — Encounter: Payer: Self-pay | Admitting: General Surgery

## 2015-06-24 ENCOUNTER — Other Ambulatory Visit: Payer: Self-pay

## 2015-06-24 ENCOUNTER — Ambulatory Visit (INDEPENDENT_AMBULATORY_CARE_PROVIDER_SITE_OTHER): Payer: Commercial Managed Care - HMO | Admitting: General Surgery

## 2015-06-24 VITALS — BP 115/54 | HR 88 | Temp 97.5°F | Wt 288.0 lb

## 2015-06-24 DIAGNOSIS — L732 Hidradenitis suppurativa: Secondary | ICD-10-CM

## 2015-06-24 DIAGNOSIS — N611 Abscess of the breast and nipple: Secondary | ICD-10-CM

## 2015-06-24 NOTE — Patient Instructions (Signed)
Please give [place] a call if you start having drainage from your Hidradenitis.

## 2015-06-24 NOTE — Progress Notes (Signed)
Outpatient Surgical Follow Up  06/24/2015  [name] [name] is an 60 y.o. female.   Chief Complaint  Patient presents with  . Routine Post Op    Hidradenitis and Left Breast Abscess    HPI: 60 year old female presents for follow-up of her a previously drained left breast abscess as well as multiple areas of hidradenitis. Patient states that hidradenitis appears to be improving and the prior breast abscess appears to be healed. She denies any fevers, chills, nausea, vomiting, diarrhea, constant patient. She also states that her most recent follow-up with her medicine doctor that her diabetes is under the best control spent a long time. She states her right axillary hidradenitis has had some drainage but it has not been painful or shown any signs of infection. The hidradenitis to her posterior neck and her left axilla have not drained or cause her any problems and many weeks. Her left breast abscess that was drained many weeks ago appears to be growing new skin without evidence of any infection or fluid collection.  Past Medical History  Diagnosis Date  . Diabetes mellitus without complication ([place])   . Thyroid disease   . COPD (chronic obstructive pulmonary disease) ([place])   . Vertigo   . Edema   . Allergic rhinitis   . Degenerative joint disease of knee, left   . Hidradenitis   . IBS (irritable bowel syndrome)   . Depression   . Vitamin D deficiency   . Insomnia   . Sleep apnea   . History of colonic polyps   . Sinusitis, chronic   . Hypertension   . Obesity   . Vaginitis, atrophic   . GERD (gastroesophageal reflux disease)   . Allergy     Past Surgical History  Procedure Laterality Date  . Abdominal hysterectomy    . Cesarean section      x 2  . Cholecystectomy    . Tonsillectomy      Family History  Problem Relation Age of Onset  . Rashes / Skin problems Father   . Hypertension Father   . Breast cancer Paternal Grandmother     Social History:  reports that she has  quit smoking. She has never used smokeless tobacco. She reports that she does not drink alcohol or use illicit drugs.  Allergies:  Allergies  Allergen Reactions  . Codeine Itching    Medications reviewed.    ROS A multipoint review of systems was completed. All pertinent positives and negatives are documented in the history of present illness and remainder negative.   BP 115/54 mmHg  Pulse 88  Temp(Src) 97.5 F (36.4 C) (Oral)  Wt 130.636 kg (288 lb)  Physical Exam  Gen.: No acute distress Breast: Left breast I and D site visualized with no evidence of erythema or fluid collection. There appears to be new epithelial tissue over the entirety of the previously drained abscess. Chest: Clear to auscultation Heart: Regular rhythm Abdomen: Soft, nontender, nondistended Skin: Posterior neck and left axillary hydradenitis examined. Palpable pellets but without any evidence of drainage or infection. Right axillary region examined with identical palpable pits and visible appearance but able to express a serous fluid from 2 openings.   No results found for this or any previous visit (from the past 48 hour(s)). No results found.  Assessment/Plan:  1. Hidradenitis 60 year old female with chronic hidradenitis to the posterior neck and bilateral axilla. No evidence of active infection. In fact this is the best the sites of looks since I've  been following her for the last 6 months. She continues to use topical clindamycin as needed. Discussed that we see her back again in 5 weeks and if there is continued drainage from the right axillary region within discuss elective surgical excision at that time. She understands this required trips to the operating room and would be removal of the prescription.  2. Breast abscess Breast abscess appears to have fully healed. Her further treatment or follow-up required for this.     Clayburn Pert, MD FACS General Surgeon  06/24/2015,4:20 PM

## 2015-07-26 ENCOUNTER — Ambulatory Visit: Payer: Self-pay | Admitting: General Surgery

## 2015-07-27 ENCOUNTER — Other Ambulatory Visit: Payer: Self-pay

## 2015-07-27 ENCOUNTER — Encounter: Payer: Self-pay | Admitting: General Surgery

## 2015-07-27 ENCOUNTER — Ambulatory Visit (INDEPENDENT_AMBULATORY_CARE_PROVIDER_SITE_OTHER): Payer: Commercial Managed Care - HMO | Admitting: General Surgery

## 2015-07-27 VITALS — BP 123/53 | HR 90 | Temp 97.9°F | Ht 63.0 in | Wt 288.0 lb

## 2015-07-27 DIAGNOSIS — L732 Hidradenitis suppurativa: Secondary | ICD-10-CM

## 2015-07-27 DIAGNOSIS — S21002D Unspecified open wound of left breast, subsequent encounter: Secondary | ICD-10-CM | POA: Diagnosis not present

## 2015-07-27 NOTE — Progress Notes (Signed)
Outpatient Surgical Follow Up  07/27/2015  [name] [name] is an 60 y.o. female.   Chief Complaint  Patient presents with  . Follow-up    Hidradenitis and Left Breast Abscess    HPI: 60 year old female returns to clinic for continued follow-up for hidradenitis and a left breast wound. Patient reports that the wound to left breast has intermittent drainage seen on her clothing. It does not cause her any pain. She denies any fevers, chills, nausea, vomiting, chest pain, shortness of breath, diarrhea, constipation. She also has hidradenitis to bilateral axilla and her left posterior neck. She states these continue to do well with only intermittent drainage. She has been using the topical clindamycin every day and has noticed a continued marked improvement.  Past Medical History  Diagnosis Date  . Diabetes mellitus without complication ([place])   . Thyroid disease   . COPD (chronic obstructive pulmonary disease) ([place])   . Vertigo   . Edema   . Allergic rhinitis   . Degenerative joint disease of knee, left   . Hidradenitis   . IBS (irritable bowel syndrome)   . Depression   . Vitamin D deficiency   . Insomnia   . Sleep apnea   . History of colonic polyps   . Sinusitis, chronic   . Hypertension   . Obesity   . Vaginitis, atrophic   . GERD (gastroesophageal reflux disease)   . Allergy     Past Surgical History  Procedure Laterality Date  . Abdominal hysterectomy    . Cesarean section      x 2  . Cholecystectomy    . Tonsillectomy      Family History  Problem Relation Age of Onset  . Rashes / Skin problems Father   . Hypertension Father   . Breast cancer Paternal Grandmother     Social History:  reports that she has quit smoking. She has never used smokeless tobacco. She reports that she does not drink alcohol or use illicit drugs.  Allergies:  Allergies  Allergen Reactions  . Codeine Itching    Medications reviewed.    ROS A multipoint review of systems was  completed. All pertinent positives and negatives are documented in the history of present illness and remainder are negative.   BP 123/53 mmHg  Pulse 90  Temp(Src) 97.9 F (36.6 C) (Oral)  Ht 5\' 3"  (1.6 m)  Wt 130.636 kg (288 lb)  BMI 51.03 kg/m2  Physical Exam Gen.: No acute distress Neck: Supple and nontender. Posterior neck with scarring from hidradenitis without any evidence of active infection or drainage. The area measures approximately 0.5 x 6 cm. Chest: Clear to auscultation Breast: Left breast with a prior I&D site visualized. The vast majority of this is really epithelialized however the most caudad aspect of the wound appears to not fully healed with some expressible serous fluid. No evidence of erythema, fluctuance, induration Heart: Regular rate and rhythm Abdomen: Soft and nontender Skin: Bilateral axilla was examined which show tracks from hidradenitis both without evidence of active abscesses or infection.    No results found for this or any previous visit (from the past 48 hour(s)). No results found.  Assessment/Plan:  1. Hidradenitis Multiple areas of hidradenitis. No current evidence of infection or fluid pockets. Counseled patient to continue her topical therapy. Continue keeping the area clean. Continue to work on diabetes control and weight loss. She voiced understanding.  2. Breast wound, left, subsequent encounter Continued small area of drainage from previous left  breast I&D. This area was treated with silver nitrate today in clinic. Counseled the patient that this would have a dark to black drainage from the next few days secondary to silver nitrate. It is hopeful this will allow the area to finish healing. She'll follow up in the surgery clinic in 3 weeks for additional wound check.     Clayburn Pert, MD FACS General Surgeon  07/27/2015,3:51 PM

## 2015-07-27 NOTE — Patient Instructions (Signed)
Follow-up in our office in 3 weeks.

## 2015-08-20 ENCOUNTER — Ambulatory Visit: Payer: Self-pay | Admitting: General Surgery

## 2015-08-26 ENCOUNTER — Telehealth: Payer: Self-pay | Admitting: General Surgery

## 2015-08-26 NOTE — Telephone Encounter (Signed)
Patient has an appointment with Dr Adonis Huguenin 6/14 for a follow up. Her breast now has blood and pus and drainage coming out of it, it is hot to the touch and now has shooting pains in it.  Please call and advise.

## 2015-08-26 NOTE — Telephone Encounter (Signed)
Spoke with patient at this time. She states that she is having drainage of blood and pus coming from it. I explained to patient that I would call her back after speaking with surgeon in regards to antibiotics or evaluation. If an opening becomes available tomorrow after confirming appointments. Patient will be placed on schedule for evaluation by surgeon.

## 2015-08-27 ENCOUNTER — Encounter: Payer: Self-pay | Admitting: Surgery

## 2015-08-27 ENCOUNTER — Other Ambulatory Visit: Payer: Self-pay

## 2015-08-27 ENCOUNTER — Ambulatory Visit (INDEPENDENT_AMBULATORY_CARE_PROVIDER_SITE_OTHER): Payer: Medicare Other | Admitting: Surgery

## 2015-08-27 VITALS — BP 102/64 | HR 103 | Temp 97.9°F | Ht 63.0 in | Wt 296.4 lb

## 2015-08-27 DIAGNOSIS — S21002D Unspecified open wound of left breast, subsequent encounter: Secondary | ICD-10-CM | POA: Diagnosis not present

## 2015-08-27 NOTE — Patient Instructions (Signed)
Please call our office if you have any questions or concerns.  

## 2015-08-27 NOTE — Progress Notes (Signed)
Outpatient Surgical Follow Up  08/27/2015  [name] [name] is an 60 y.o. female.   CC: Left breast wound  HPI: Patient describes a left breast abscess that apparently spontaneously drained earlier in the week she's not been on antibiotic she states that she had some pain and then it drained blood and pus for 2 days and now is much better. She denies fevers or chills has no breast pain at this time  Past Medical History  Diagnosis Date  . Diabetes mellitus without complication ([place])   . Thyroid disease   . COPD (chronic obstructive pulmonary disease) ([place])   . Vertigo   . Edema   . Allergic rhinitis   . Degenerative joint disease of knee, left   . Hidradenitis   . IBS (irritable bowel syndrome)   . Depression   . Vitamin D deficiency   . Insomnia   . Sleep apnea   . History of colonic polyps   . Sinusitis, chronic   . Hypertension   . Obesity   . Vaginitis, atrophic   . GERD (gastroesophageal reflux disease)   . Allergy     Past Surgical History  Procedure Laterality Date  . Abdominal hysterectomy    . Cesarean section      x 2  . Cholecystectomy    . Tonsillectomy      Family History  Problem Relation Age of Onset  . Rashes / Skin problems Father   . Hypertension Father   . Breast cancer Paternal Grandmother     Social History:  reports that she has quit smoking. She has never used smokeless tobacco. She reports that she does not drink alcohol or use illicit drugs.  Allergies:  Allergies  Allergen Reactions  . Codeine Itching    Medications reviewed.   Review of Systems:   Review of Systems  Constitutional: Negative.   HENT: Negative.   Eyes: Negative.   Cardiovascular: Negative.   Gastrointestinal: Negative.   Genitourinary: Negative.   Musculoskeletal: Negative.   Skin: Negative.   Neurological: Negative.   Endo/Heme/Allergies: Negative.   Psychiatric/Behavioral: Negative.      Physical Exam:  BP 102/64 mmHg  Pulse 103  Temp(Src) 97.9  F (36.6 C) (Oral)  Ht 5\' 3"  (1.6 m)  Wt 296 lb 6.4 oz (134.446 kg)  BMI 52.52 kg/m2  Physical Exam  Constitutional: She is oriented to person, place, and time. No distress.  Morbidly obese in a wheelchair  Eyes: Right eye exhibits no discharge. Left eye exhibits no discharge. No scleral icterus.  Musculoskeletal: Normal range of motion. She exhibits edema.  Neurological: She is alert and oriented to person, place, and time.  Skin: Skin is warm and dry. No rash noted. She is not diaphoretic. No erythema.  Small open wound is granulating tissue in the left periareolar area at the 3:00 position silver nitrate is applied there is no erythema no drainage no expressible pus and no tenderness  Vitals reviewed.     No results found for this or any previous visit (from the past 48 hour(s)). No results found.  Assessment/Plan:  Patient likely experienced a recurrent abscess and that has spontaneously drained and resolved currently there is no sign of infection she is not on any antibiotics and does not warrant him at this time silver nitrate is applied to the small area of granulation tissue she'll follow-up on an as-needed basis but she states she has an appointment with Dr. Adonis Huguenin next week which she would like to  keep  Florene Glen, MD, FACS

## 2015-08-27 NOTE — Telephone Encounter (Signed)
Per Ami, Patient was placed on schedule at 1430 today.

## 2015-08-30 ENCOUNTER — Other Ambulatory Visit: Payer: Self-pay

## 2015-08-31 ENCOUNTER — Telehealth: Payer: Self-pay | Admitting: General Surgery

## 2015-08-31 NOTE — Telephone Encounter (Signed)
I spoke with patient. She explained to me that she has been given Doxycycline by her PCP for respiratory symptoms and she is starting this. She would like our office to be advised.

## 2015-08-31 NOTE — Telephone Encounter (Signed)
Patient cancelled appointment for 6/14 and stated that when she isn't on an antibiotic the areas are always inflamed. She wanted Museum/gallery conservator and Dr. Adonis Huguenin to know that. Dr. Burt Knack didn't give her one

## 2015-09-01 ENCOUNTER — Ambulatory Visit: Payer: Self-pay | Admitting: General Surgery

## 2015-09-08 ENCOUNTER — Observation Stay
Admission: EM | Admit: 2015-09-08 | Discharge: 2015-09-10 | Disposition: A | Discharge status: Home / Self Care | Payer: Medicare Other | Attending: Specialist | Admitting: Specialist

## 2015-09-08 ENCOUNTER — Emergency Department: Payer: Medicare Other

## 2015-09-08 ENCOUNTER — Other Ambulatory Visit: Payer: Self-pay

## 2015-09-08 ENCOUNTER — Encounter: Payer: Self-pay | Admitting: Emergency Medicine

## 2015-09-08 DIAGNOSIS — E039 Hypothyroidism, unspecified: Secondary | ICD-10-CM | POA: Diagnosis not present

## 2015-09-08 DIAGNOSIS — N179 Acute kidney failure, unspecified: Secondary | ICD-10-CM | POA: Insufficient documentation

## 2015-09-08 DIAGNOSIS — E669 Obesity, unspecified: Secondary | ICD-10-CM | POA: Insufficient documentation

## 2015-09-08 DIAGNOSIS — J9601 Acute respiratory failure with hypoxia: Secondary | ICD-10-CM | POA: Diagnosis not present

## 2015-09-08 DIAGNOSIS — Z794 Long term (current) use of insulin: Secondary | ICD-10-CM | POA: Diagnosis not present

## 2015-09-08 DIAGNOSIS — Z87891 Personal history of nicotine dependence: Secondary | ICD-10-CM | POA: Diagnosis not present

## 2015-09-08 DIAGNOSIS — J329 Chronic sinusitis, unspecified: Secondary | ICD-10-CM | POA: Diagnosis not present

## 2015-09-08 DIAGNOSIS — E559 Vitamin D deficiency, unspecified: Secondary | ICD-10-CM | POA: Diagnosis not present

## 2015-09-08 DIAGNOSIS — K589 Irritable bowel syndrome without diarrhea: Secondary | ICD-10-CM | POA: Insufficient documentation

## 2015-09-08 DIAGNOSIS — F329 Major depressive disorder, single episode, unspecified: Secondary | ICD-10-CM | POA: Diagnosis not present

## 2015-09-08 DIAGNOSIS — R0902 Hypoxemia: Secondary | ICD-10-CM

## 2015-09-08 DIAGNOSIS — K219 Gastro-esophageal reflux disease without esophagitis: Secondary | ICD-10-CM | POA: Diagnosis not present

## 2015-09-08 DIAGNOSIS — Z90710 Acquired absence of both cervix and uterus: Secondary | ICD-10-CM | POA: Insufficient documentation

## 2015-09-08 DIAGNOSIS — E1122 Type 2 diabetes mellitus with diabetic chronic kidney disease: Secondary | ICD-10-CM | POA: Insufficient documentation

## 2015-09-08 DIAGNOSIS — Z6841 Body Mass Index (BMI) 40.0 and over, adult: Secondary | ICD-10-CM | POA: Diagnosis not present

## 2015-09-08 DIAGNOSIS — E119 Type 2 diabetes mellitus without complications: Secondary | ICD-10-CM

## 2015-09-08 DIAGNOSIS — E1140 Type 2 diabetes mellitus with diabetic neuropathy, unspecified: Secondary | ICD-10-CM | POA: Insufficient documentation

## 2015-09-08 DIAGNOSIS — I129 Hypertensive chronic kidney disease with stage 1 through stage 4 chronic kidney disease, or unspecified chronic kidney disease: Secondary | ICD-10-CM | POA: Diagnosis not present

## 2015-09-08 DIAGNOSIS — N189 Chronic kidney disease, unspecified: Secondary | ICD-10-CM | POA: Insufficient documentation

## 2015-09-08 DIAGNOSIS — J440 Chronic obstructive pulmonary disease with acute lower respiratory infection: Secondary | ICD-10-CM | POA: Insufficient documentation

## 2015-09-08 DIAGNOSIS — E860 Dehydration: Secondary | ICD-10-CM | POA: Diagnosis not present

## 2015-09-08 DIAGNOSIS — Z7982 Long term (current) use of aspirin: Secondary | ICD-10-CM | POA: Diagnosis not present

## 2015-09-08 DIAGNOSIS — E785 Hyperlipidemia, unspecified: Secondary | ICD-10-CM | POA: Diagnosis not present

## 2015-09-08 DIAGNOSIS — J189 Pneumonia, unspecified organism: Principal | ICD-10-CM

## 2015-09-08 DIAGNOSIS — A419 Sepsis, unspecified organism: Secondary | ICD-10-CM | POA: Diagnosis present

## 2015-09-08 DIAGNOSIS — G4733 Obstructive sleep apnea (adult) (pediatric): Secondary | ICD-10-CM | POA: Insufficient documentation

## 2015-09-08 DIAGNOSIS — J9600 Acute respiratory failure, unspecified whether with hypoxia or hypercapnia: Secondary | ICD-10-CM | POA: Diagnosis present

## 2015-09-08 DIAGNOSIS — Z885 Allergy status to narcotic agent status: Secondary | ICD-10-CM | POA: Insufficient documentation

## 2015-09-08 DIAGNOSIS — J309 Allergic rhinitis, unspecified: Secondary | ICD-10-CM | POA: Diagnosis not present

## 2015-09-08 DIAGNOSIS — Z9049 Acquired absence of other specified parts of digestive tract: Secondary | ICD-10-CM | POA: Insufficient documentation

## 2015-09-08 DIAGNOSIS — M1712 Unilateral primary osteoarthritis, left knee: Secondary | ICD-10-CM | POA: Insufficient documentation

## 2015-09-08 DIAGNOSIS — Z86010 Personal history of colonic polyps: Secondary | ICD-10-CM | POA: Insufficient documentation

## 2015-09-08 DIAGNOSIS — N952 Postmenopausal atrophic vaginitis: Secondary | ICD-10-CM | POA: Insufficient documentation

## 2015-09-08 DIAGNOSIS — Z9889 Other specified postprocedural states: Secondary | ICD-10-CM | POA: Insufficient documentation

## 2015-09-08 LAB — LACTIC ACID, PLASMA
Lactic Acid, Venous: 1.9 mmol/L (ref 0.5–2.0)
Lactic Acid, Venous: 1.2 mmol/L (ref 0.5–2.0)

## 2015-09-08 LAB — URINALYSIS COMPLETE WITH MICROSCOPIC (ARMC ONLY)
BILIRUBIN URINE: NEGATIVE
Bacteria, UA: NONE SEEN
Glucose, UA: NEGATIVE mg/dL
HGB URINE DIPSTICK: NEGATIVE
KETONES UR: NEGATIVE mg/dL
LEUKOCYTES UA: NEGATIVE
Nitrite: NEGATIVE
PH: 5 (ref 5.0–8.0)
Protein, ur: NEGATIVE mg/dL
RBC / HPF: NONE SEEN RBC/hpf (ref 0–5)
Specific Gravity, Urine: 1.006 (ref 1.005–1.030)

## 2015-09-08 LAB — BASIC METABOLIC PANEL
ANION GAP: 11 (ref 5–15)
BUN: 50 mg/dL — ABNORMAL HIGH (ref 6–20)
CALCIUM: 8.9 mg/dL (ref 8.9–10.3)
CO2: 28 mmol/L (ref 22–32)
Chloride: 94 mmol/L — ABNORMAL LOW (ref 101–111)
Creatinine, Ser: 2.06 mg/dL — ABNORMAL HIGH (ref 0.44–1.00)
GFR, EST AFRICAN AMERICAN: 29 mL/min — AB (ref >60)
GFR, EST NON AFRICAN AMERICAN: 25 mL/min — AB (ref >60)
Glucose, Bld: 119 mg/dL — ABNORMAL HIGH (ref 65–99)
Potassium: 4.5 mmol/L (ref 3.5–5.1)
SODIUM: 133 mmol/L — AB (ref 135–145)

## 2015-09-08 LAB — CBC
HCT: 37 % (ref 35.0–47.0)
HEMOGLOBIN: 12.9 g/dL (ref 12.0–16.0)
MCH: 28 pg (ref 26.0–34.0)
MCHC: 34.8 g/dL (ref 32.0–36.0)
MCV: 80.2 fL (ref 80.0–100.0)
PLATELETS: 291 10*3/uL (ref 150–440)
RBC: 4.61 MIL/uL (ref 3.80–5.20)
RDW: 15.8 % — ABNORMAL HIGH (ref 11.5–14.5)
WBC: 14.7 10*3/uL — ABNORMAL HIGH (ref 3.6–11.0)

## 2015-09-08 LAB — BRAIN NATRIURETIC PEPTIDE: B Natriuretic Peptide: 33 pg/mL (ref 0.0–100.0)

## 2015-09-08 LAB — GLUCOSE, CAPILLARY
Glucose-Capillary: 116 mg/dL — ABNORMAL HIGH (ref 65–99)
Glucose-Capillary: 92 mg/dL (ref 65–99)

## 2015-09-08 MED ORDER — SODIUM CHLORIDE 0.9 % IV BOLUS (SEPSIS)
1000.0000 mL | Freq: Once | INTRAVENOUS | Status: AC
  Administered 2015-09-08: 1000 mL via INTRAVENOUS

## 2015-09-08 MED ORDER — DOXEPIN HCL 50 MG PO CAPS
50.0000 mg | ORAL_CAPSULE | Freq: Every day | ORAL | Status: DC
  Administered 2015-09-09: 50 mg via ORAL
  Filled 2015-09-08 (×2): qty 1

## 2015-09-08 MED ORDER — OLOPATADINE HCL 0.1 % OP SOLN
1.0000 [drp] | Freq: Two times a day (BID) | OPHTHALMIC | Status: DC
  Administered 2015-09-09 – 2015-09-10 (×4): 1 [drp] via OPHTHALMIC
  Filled 2015-09-08: qty 5

## 2015-09-08 MED ORDER — PIPERACILLIN-TAZOBACTAM 4.5 G IVPB
4.5000 g | Freq: Three times a day (TID) | INTRAVENOUS | Status: DC
  Filled 2015-09-08 (×3): qty 100

## 2015-09-08 MED ORDER — ASPIRIN EC 81 MG PO TBEC
81.0000 mg | DELAYED_RELEASE_TABLET | Freq: Every day | ORAL | Status: DC
  Administered 2015-09-09 (×2): 81 mg via ORAL
  Filled 2015-09-08 (×2): qty 1

## 2015-09-08 MED ORDER — MECLIZINE HCL 25 MG PO TABS: 25.0000 mg | ORAL_TABLET | Freq: Three times a day (TID) | ORAL | Status: DC | PRN

## 2015-09-08 MED ORDER — TRAZODONE HCL 50 MG PO TABS: 50.0000 mg | ORAL_TABLET | Freq: Every evening | ORAL | Status: DC | PRN

## 2015-09-08 MED ORDER — ONDANSETRON HCL 4 MG PO TABS: 4.0000 mg | ORAL_TABLET | Freq: Three times a day (TID) | ORAL | Status: DC | PRN

## 2015-09-08 MED ORDER — DEXTROSE 5 % IV SOLN
250.0000 mg | Freq: Every day | INTRAVENOUS | Status: DC
  Filled 2015-09-08 (×3): qty 250

## 2015-09-08 MED ORDER — HEPARIN SODIUM (PORCINE) 5000 UNIT/ML IJ SOLN
5000.0000 [IU] | Freq: Three times a day (TID) | INTRAMUSCULAR | Status: DC
  Administered 2015-09-09 (×3): 5000 [IU] via SUBCUTANEOUS
  Filled 2015-09-08 (×3): qty 1

## 2015-09-08 MED ORDER — PREDNISONE 50 MG PO TABS
50.0000 mg | ORAL_TABLET | Freq: Every day | ORAL | Status: DC
  Administered 2015-09-09 – 2015-09-10 (×2): 50 mg via ORAL
  Filled 2015-09-08 (×2): qty 1

## 2015-09-08 MED ORDER — POTASSIUM CHLORIDE CRYS ER 10 MEQ PO TBCR
10.0000 meq | EXTENDED_RELEASE_TABLET | Freq: Every day | ORAL | Status: DC
  Administered 2015-09-09 – 2015-09-10 (×2): 10 meq via ORAL
  Filled 2015-09-08 (×3): qty 1

## 2015-09-08 MED ORDER — ATORVASTATIN CALCIUM 20 MG PO TABS
20.0000 mg | ORAL_TABLET | Freq: Every day | ORAL | Status: DC
  Administered 2015-09-09 (×2): 20 mg via ORAL
  Filled 2015-09-08 (×2): qty 1

## 2015-09-08 MED ORDER — SODIUM CHLORIDE 0.9 % IV BOLUS (SEPSIS): 1000.0000 mL | Freq: Once | INTRAVENOUS | Status: DC

## 2015-09-08 MED ORDER — SODIUM CHLORIDE 0.9% FLUSH
3.0000 mL | Freq: Two times a day (BID) | INTRAVENOUS | Status: DC
  Administered 2015-09-09 – 2015-09-10 (×4): 3 mL via INTRAVENOUS

## 2015-09-08 MED ORDER — FLUTICASONE PROPIONATE 50 MCG/ACT NA SUSP
1.0000 | Freq: Every day | NASAL | Status: DC
  Administered 2015-09-09 – 2015-09-10 (×2): 1 via NASAL
  Filled 2015-09-08: qty 16

## 2015-09-08 MED ORDER — DEXTROSE 5 % IV SOLN
1.0000 g | Freq: Every day | INTRAVENOUS | Status: DC
  Administered 2015-09-09 (×2): 1 g via INTRAVENOUS
  Filled 2015-09-08 (×3): qty 10

## 2015-09-08 MED ORDER — MONTELUKAST SODIUM 10 MG PO TABS
10.0000 mg | ORAL_TABLET | Freq: Every day | ORAL | Status: DC
  Administered 2015-09-09 (×2): 10 mg via ORAL
  Filled 2015-09-08 (×2): qty 1

## 2015-09-08 MED ORDER — ALBUTEROL SULFATE (2.5 MG/3ML) 0.083% IN NEBU: 2.5000 mg | INHALATION_SOLUTION | Freq: Four times a day (QID) | RESPIRATORY_TRACT | Status: DC | PRN

## 2015-09-08 MED ORDER — INSULIN ASPART 100 UNIT/ML ~~LOC~~ SOLN
0.0000 [IU] | Freq: Three times a day (TID) | SUBCUTANEOUS | Status: DC
  Administered 2015-09-09: 5 [IU] via SUBCUTANEOUS
  Filled 2015-09-08: qty 5

## 2015-09-08 MED ORDER — INSULIN GLARGINE 100 UNIT/ML ~~LOC~~ SOLN
20.0000 [IU] | Freq: Two times a day (BID) | SUBCUTANEOUS | Status: DC
  Administered 2015-09-09 (×2): 20 [IU] via SUBCUTANEOUS
  Filled 2015-09-08 (×5): qty 0.2

## 2015-09-08 MED ORDER — LORATADINE 10 MG PO TABS
10.0000 mg | ORAL_TABLET | Freq: Every day | ORAL | Status: DC
  Administered 2015-09-09 – 2015-09-10 (×2): 10 mg via ORAL
  Filled 2015-09-08 (×2): qty 1

## 2015-09-08 MED ORDER — MOMETASONE FURO-FORMOTEROL FUM 200-5 MCG/ACT IN AERO
2.0000 | INHALATION_SPRAY | Freq: Two times a day (BID) | RESPIRATORY_TRACT | Status: DC
  Administered 2015-09-09 – 2015-09-10 (×4): 2 via RESPIRATORY_TRACT
  Filled 2015-09-08: qty 8.8

## 2015-09-08 MED ORDER — VANCOMYCIN HCL IN DEXTROSE 1-5 GM/200ML-% IV SOLN
1000.0000 mg | Freq: Once | INTRAVENOUS | Status: AC
  Administered 2015-09-08: 1000 mg via INTRAVENOUS
  Filled 2015-09-08: qty 200

## 2015-09-08 MED ORDER — DULOXETINE HCL 20 MG PO CPEP
20.0000 mg | ORAL_CAPSULE | Freq: Every day | ORAL | Status: DC
  Administered 2015-09-09 – 2015-09-10 (×2): 20 mg via ORAL
  Filled 2015-09-08 (×3): qty 1

## 2015-09-08 MED ORDER — PIPERACILLIN-TAZOBACTAM 3.375 G IVPB 30 MIN
3.3750 g | Freq: Once | INTRAVENOUS | Status: AC
  Administered 2015-09-08: 3.375 g via INTRAVENOUS
  Filled 2015-09-08: qty 50

## 2015-09-08 MED ORDER — LEVOTHYROXINE SODIUM 50 MCG PO TABS
200.0000 ug | ORAL_TABLET | Freq: Every day | ORAL | Status: DC
  Administered 2015-09-09 – 2015-09-10 (×2): 200 ug via ORAL
  Filled 2015-09-08 (×2): qty 1

## 2015-09-08 MED ORDER — GABAPENTIN 100 MG PO CAPS
200.0000 mg | ORAL_CAPSULE | Freq: Every day | ORAL | Status: DC
  Administered 2015-09-09 (×2): 200 mg via ORAL
  Filled 2015-09-08 (×2): qty 2

## 2015-09-08 MED ORDER — SODIUM CHLORIDE 0.9 % IV SOLN
INTRAVENOUS | Status: DC
  Administered 2015-09-09 (×2): via INTRAVENOUS

## 2015-09-08 MED ORDER — VANCOMYCIN HCL 500 MG IV SOLR
500.0000 mg | INTRAVENOUS | Status: DC
  Filled 2015-09-08: qty 500

## 2015-09-08 MED ORDER — PANTOPRAZOLE SODIUM 40 MG PO TBEC
40.0000 mg | DELAYED_RELEASE_TABLET | Freq: Every day | ORAL | Status: DC
  Administered 2015-09-09 – 2015-09-10 (×2): 40 mg via ORAL
  Filled 2015-09-08 (×2): qty 1

## 2015-09-08 NOTE — ED Notes (Signed)
Noted that patient bilateral lower extremities with edema with redness and weeping.  Pt states this has been happening for approx 1 month.

## 2015-09-08 NOTE — ED Notes (Signed)
Pt assisted to toilet with 1 nurse assist. Bed moved closer to toilet, pt sat up on side of bed by self, got up by self and moved to toilet by self with this RN there for assistance.

## 2015-09-08 NOTE — ED Provider Notes (Signed)
[facility] Emergency Department Provider Note    ____________________________________________  Time seen: ~1710  I have reviewed the triage vital signs and the nursing notes.   HISTORY  Chief Complaint Sick  History limited by: Not Limited   HPI [name] [name] is a 60 y.o. female presents to the emergency department today with chief complaint of "sick".She says that she has been sick for the past 2 weeks. She is being, progressively weaker. When she stands she feels dizzy and was concerned she would fall over. She fell 2 days ago and then again today. The patient says she has had associated nausea but no vomiting. No change in bowel movement. No dysuria or bad odor to her urine. The patient denies any fevers. She has had some chills. In addition she has noticed some swelling to her lower extremities with redness. This has also been getting worse for the past 2 weeks.   Past Medical History  Diagnosis Date  . Diabetes mellitus without complication ([place])   . Thyroid disease   . COPD (chronic obstructive pulmonary disease) ([place])   . Vertigo   . Edema   . Allergic rhinitis   . Degenerative joint disease of knee, left   . Hidradenitis   . IBS (irritable bowel syndrome)   . Depression   . Vitamin D deficiency   . Insomnia   . Sleep apnea   . History of colonic polyps   . Sinusitis, chronic   . Hypertension   . Obesity   . Vaginitis, atrophic   . GERD (gastroesophageal reflux disease)   . Allergy     Patient Active Problem List   Diagnosis Date Noted  . Breast abscess 06/02/2015  . Abnormal mammogram of both breasts 02/25/2015  . Hidradenitis 12/30/2014  . Diabetes ([place]) 12/30/2014  . Essential hypertension 12/30/2014  . Asthma 12/30/2014    Past Surgical History  Procedure Laterality Date  . Abdominal hysterectomy    . Cesarean section      x 2  . Cholecystectomy    . Tonsillectomy      Current Outpatient Rx  Name  Route  Sig   Dispense  Refill  . ACCU-CHEK AVIVA PLUS test strip      2 (two) times daily. for testing      0     Dispense as written.   [name] albuterol (PROVENTIL HFA;VENTOLIN HFA) 108 (90 BASE) MCG/ACT inhaler   Inhalation   Inhale 2 puffs into the lungs every 4 (four) hours as needed for wheezing or shortness of breath.         [name] albuterol (PROVENTIL) (2.5 MG/3ML) 0.083% nebulizer solution   Nebulization   Take 2.5 mg by nebulization every 4 (four) hours as needed for wheezing or shortness of breath.         [name] atorvastatin (LIPITOR) 20 MG tablet   Oral   Take 20 mg by mouth daily at 6 PM.         . B-D UF III MINI PEN NEEDLES 31G X 5 MM MISC      use twice a day for INJECTIONS      1     Dispense as written.   . Blood Glucose Monitoring Suppl (ACCU-CHEK AVIVA PLUS) W/DEVICE KIT      CHECK BLOOD SUGAR TWICE A DAY      0   . clindamycin (CLEOCIN-T) 1 % external solution      Apply to affected area 2 times daily  60 mL   3   . doxepin (SINEQUAN) 50 MG capsule   Oral   Take 50 mg by mouth at bedtime.         [name] doxycycline (VIBRAMYCIN) 50 MG capsule   Oral   Take 1 capsule (50 mg total) by mouth 2 (two) times daily.   20 capsule   0   . DULoxetine (CYMBALTA) 20 MG capsule   Oral   Take 20 mg by mouth daily.         [name] etodolac (LODINE) 400 MG tablet   Oral   Take 1 tablet (400 mg total) by mouth 2 (two) times daily.   20 tablet   0   . Fluticasone-Salmeterol (ADVAIR DISKUS) 250-50 MCG/DOSE AEPB   Inhalation   Inhale 1 puff into the lungs daily as needed.          . furosemide (LASIX) 40 MG tablet   Oral   Take 40 mg by mouth 2 (two) times daily.         [name] gabapentin (NEURONTIN) 100 MG capsule   Oral   Take 200 mg by mouth at bedtime.          [name] HUMULIN R U-500 KWIKPEN 500 UNIT/ML injection   Subcutaneous   Inject 120 Units into the skin 2 (two) times daily with a meal.       1     Dispense as written.   [name] levothyroxine (SYNTHROID,  LEVOTHROID) 200 MCG tablet   Oral   Take 1 tablet by mouth daily.      1   . lisinopril-hydrochlorothiazide (PRINZIDE,ZESTORETIC) 10-12.5 MG per tablet   Oral   Take 1 tablet by mouth daily.         [name] loratadine (CLARITIN) 10 MG tablet   Oral   Take 10 mg by mouth daily.         . meclizine (ANTIVERT) 25 MG tablet   Oral   Take 25 mg by mouth 2 (two) times daily as needed for dizziness.          . mometasone (NASONEX) 50 MCG/ACT nasal spray   Nasal   Place 2 sprays into the nose daily.         . montelukast (SINGULAIR) 10 MG tablet   Oral   Take 1 tablet by mouth.      1   . olopatadine (PATANOL) 0.1 % ophthalmic solution   Both Eyes   Place 1 drop into both eyes 2 (two) times daily.         . ondansetron (ZOFRAN) 8 MG tablet      TAKE 1/2 TO 1 TABLET BY MOUTH EVERY 8 HRS AS NEEDED FOR NAUSEA AND VOMITTING      0   . pantoprazole (PROTONIX) 40 MG tablet      take 1 tablet by mouth once daily for REFLUX/HEARTBURN      1   . potassium chloride (K-DUR) 10 MEQ tablet   Oral   Take 10 mEq by mouth once.       1   . SYMBICORT 160-4.5 MCG/ACT inhaler   Inhalation   Inhale 2 puffs into the lungs 2 (two) times daily.      0     Dispense as written.   . TRADJENTA 5 MG TABS tablet   Oral   Take 5 mg by mouth daily.       1     Dispense as written.   [name]  traZODone (DESYREL) 50 MG tablet   Oral   Take 50 mg by mouth at bedtime. 1-2 by mouth nightly at bedtime to help sleep           Allergies Codeine  Family History  Problem Relation Age of Onset  . Rashes / Skin problems Father   . Hypertension Father   . Breast cancer Paternal Grandmother     Social History Social History  Substance Use Topics  . Smoking status: Former Research scientist (life sciences)  . Smokeless tobacco: Never Used  . Alcohol Use: No    Review of Systems  Constitutional: Negative for fever. Cardiovascular: Negative for chest pain. Respiratory: Positive for shortness of  breath. Gastrointestinal: Negative for abdominal pain, vomiting and diarrhea. Neurological: Negative for headaches, focal weakness or numbness.  10-point ROS otherwise negative.  ____________________________________________   PHYSICAL EXAM:  VITAL SIGNS: ED Triage Vitals  Enc Vitals Group     BP 09/08/15 1607 96/45 mmHg     Pulse Rate 09/08/15 1607 101     Resp 09/08/15 1607 20     Temp 09/08/15 1607 98.2 F (36.8 C)     Temp Source 09/08/15 1607 Oral     SpO2 09/08/15 1607 86 %     Weight 09/08/15 1607 290 lb (131.543 kg)     Height 09/08/15 1607 5' 4"  (1.626 m)     Head Cir --      Peak Flow --      Pain Score 09/08/15 1608 8   Constitutional: Alert and oriented. No acute distress. Obese. Nasal cannula in place. Eyes: Conjunctivae are normal. PERRL. Normal extraocular movements. ENT   Head: Normocephalic and atraumatic.   Nose: No congestion/rhinnorhea.   Mouth/Throat: Mucous membranes are moist.   Neck: No stridor. Hematological/Lymphatic/Immunilogical: No cervical lymphadenopathy. Cardiovascular: Normal rate, regular rhythm.  No murmurs, rubs, or gallops. Respiratory: Normal respiratory effort without tachypnea nor retractions. Breath sounds are clear and equal bilaterally. No wheezes/rales/rhonchi appreciated however exam is somewhat limited secondary to body habitus. Gastrointestinal: Soft and nontender. No distention.  Genitourinary: Deferred Musculoskeletal: Normal range of motion in all extremities. No joint effusions.  Bilateral lower extremity swelling. Left leg with slightly more erythema with 2 sites of small ulcers overlying the left shin. Patient also has some erythema however not as diffuse. No significant tenderness to either lower extremity. Neurologic:  Normal speech and language. No gross focal neurologic deficits are appreciated.  Skin:  Skin is warm. Psychiatric: Mood and affect are normal. Speech and behavior are normal. Patient exhibits  appropriate insight and judgment.  ____________________________________________    LABS (pertinent positives/negatives)  Labs Reviewed  BASIC METABOLIC PANEL - Abnormal; Notable for the following:    Sodium 133 (*)    Chloride 94 (*)    Glucose, Bld 119 (*)    BUN 50 (*)    Creatinine, Ser 2.06 (*)    GFR calc non Af Amer 25 (*)    GFR calc Af Amer 29 (*)    All other components within normal limits  CBC - Abnormal; Notable for the following:    WBC 14.7 (*)    RDW 15.8 (*)    All other components within normal limits  URINALYSIS COMPLETEWITH MICROSCOPIC ([facility] ONLY) - Abnormal; Notable for the following:    Color, Urine STRAW (*)    APPearance CLEAR (*)    Squamous Epithelial / LPF 0-5 (*)    All other components within normal limits  GLUCOSE, CAPILLARY - Abnormal; Notable for the  following:    Glucose-Capillary 116 (*)    All other components within normal limits  CULTURE, BLOOD (ROUTINE X 2)  CULTURE, BLOOD (ROUTINE X 2)  URINE CULTURE  CULTURE, EXPECTORATED SPUTUM-ASSESSMENT  LACTIC ACID, PLASMA  LACTIC ACID, PLASMA  BRAIN NATRIURETIC PEPTIDE  GLUCOSE, CAPILLARY  TROPONIN I  CREATININE, SERUM  CBG MONITORING, ED     ____________________________________________   EKG  I, Nance Pear, attending physician, personally viewed and interpreted this EKG  EKG Time: 1616 Rate: 94 Rhythm: normal sinus rhythm Axis: left axis deviation Intervals: qtc 445 QRS: narrow, low voltage ST changes: no st elevation Impression: abnormal ekg  ____________________________________________    RADIOLOGY  CXR IMPRESSION: Mild diffuse prominence of the pulmonary markings may be due to vascular crowding related to low lung volumes. An atypical or viral infectious process cannot be excluded.  ____________________________________________   PROCEDURES  Procedure(s) performed: None  Critical Care performed: Yes, see critical care note(s)  CRITICAL CARE Performed  by: Nance Pear   Total critical care time: 30 minutes  Critical care time was exclusive of separately billable procedures and treating other patients.  Critical care was necessary to treat or prevent imminent or life-threatening deterioration.  Critical care was time spent personally by me on the following activities: development of treatment plan with patient and/or surrogate as well as nursing, discussions with consultants, evaluation of patient's response to treatment, examination of patient, obtaining history from patient or surrogate, ordering and performing treatments and interventions, ordering and review of laboratory studies, ordering and review of radiographic studies, pulse oximetry and re-evaluation of patient's condition.  ____________________________________________   INITIAL IMPRESSION / ASSESSMENT AND PLAN / ED COURSE  Pertinent labs & imaging results that were available during my care of the patient were reviewed by me and considered in my medical decision making (see chart for details).  Patient presented to the emergency department today because feeling unwell for the past 2 weeks. Vital signs and leukocytosis concerning for sepsis. She was called a code sepsis will be given IV fluids and broad-spectrum antibiotics. This point think most likely sources cellulitis of the lower extremities. Will plan on getting chest x-ray and urine as well. Will check lactic acid.  Test x-ray concerning for possible atypical infection. I discussed this finding with patient. The patient will require admission for sepsis, likely secondary to pneumonia. Pneumonia could also explain the patient's hypoxia on initial arrival to the emergency department.  ____________________________________________   FINAL CLINICAL IMPRESSION(S) / ED DIAGNOSES  Final diagnoses:  Hypoxia  Community acquired pneumonia  Sepsis, due to unspecified organism [facility])     Note: This dictation was prepared  with Sales executive. Any transcriptional errors that result from this process are unintentional    Nance Pear, MD 09/08/15 2315

## 2015-09-08 NOTE — ED Notes (Addendum)
Pt to ED today complaining of dizziness, nausea, SOB, falling once today.  Pt states "I feel drunk and dizzy".  Pt reports these symptoms for about 2-3 weeks.  Pt SpO2 86% in triage.  Pt placed on 2L n/c with O2 sats 94%

## 2015-09-08 NOTE — ED Notes (Signed)
EDP at bedside  

## 2015-09-08 NOTE — ED Notes (Signed)
Called [facility] checking on status of bed. Was informed they are having a shift change at this moment. Left name and number and waiting call back. Was informed that they will go ahead and accept the bed and make it ready. Changed room number and let this RN know new room number.

## 2015-09-08 NOTE — Progress Notes (Signed)
Pharmacy Antibiotic Note  September [name] [name] is a 60 y.o. female admitted on 09/08/2015 with sepsis.  Pharmacy has been consulted for Vancomycin and Zosyn  dosing.  Plan: Will start Zosyn 4.5 g IV q8 hours  Patient received Vancomycin 1000 mg IV x 1. Will give an additional 500 mg to = 1500 mg. Serum creatinine likely not @ baseline therefore will not initiate dosing regimen for now. Will re-evaluate renal function in am and will then determine dosing regimen.    Height: 5\' 4"  (162.6 cm) Weight: 290 lb (131.543 kg) IBW/kg (Calculated) : 54.7  Temp (24hrs), Avg:98.2 F (36.8 C), Min:98.2 F (36.8 C), Max:98.2 F (36.8 C)   Recent Labs Lab 09/08/15 1621 09/08/15 1722  WBC 14.7*  --   CREATININE 2.06*  --   LATICACIDVEN  --  1.9    Estimated Creatinine Clearance: 39.6 mL/min (by C-G formula based on Cr of 2.06).    Allergies  Allergen Reactions  . Codeine Itching    Antimicrobials this admission:  Dose adjustments this admission:   Microbiology results:  BCx: pending   UCx:  Pending   Sputum:   MRSA PCR:   Thank you for allowing pharmacy to be a part of this patient's care.  Volney Reierson D 09/08/2015 6:52 PM

## 2015-09-08 NOTE — ED Notes (Signed)
Admitting MD at bedside.

## 2015-09-08 NOTE — ED Notes (Signed)
Per Dr. Archie Balboa, pt is okay to eat. Daughter brought pt food. Pt provided diet ginger ale to drink per request.

## 2015-09-08 NOTE — ED Notes (Signed)
Called code sepsis to Helaine Chess, [place]

## 2015-09-08 NOTE — ED Notes (Signed)
Pt returned from xray, resting in bed 

## 2015-09-08 NOTE — H&P (Signed)
[facility] [place] NAME: [name] [name]    MR#:  [id number]  DATE OF BIRTH:  Oct 30, 1955  DATE OF ADMISSION:  09/08/2015  PRIMARY CARE PHYSICIAN: [name], MD   REQUESTING/REFERRING PHYSICIAN: Archie Balboa  CHIEF COMPLAINT:   Chief Complaint  Patient presents with  . Near Syncope  . Nausea    HISTORY OF PRESENT ILLNESS: [name] [name]  is a 60 y.o. female with a known history of Diabetes, thyroid disease, COPD, allergic rhinitis, irritable bowel syndrome, vitamin D, sinusitis, hypertension, gastroesophageal reflux disease- for last 2 months she is feeling worsening in her shortness of breath she contacted her primary care doctor's office a month ago and she was given a course of prednisone and antibiotic orally which she finished and felt better for almost 2 weeks and after that again she started feeling progressively short of breath. She also is complaining of worsening swelling and edema all over her body. She also feels extremely short of breath with minimal exertion and have mild sputum production. She denies any fever or chills. Concerned with this she came to emergency room today. She was noted to have acute worsening in renal function and some atypical pneumonia findings on chest x-ray with elevated white blood cell count.  PAST MEDICAL HISTORY:   Past Medical History  Diagnosis Date  . Diabetes mellitus without complication ([place])   . Thyroid disease   . COPD (chronic obstructive pulmonary disease) ([place])   . Vertigo   . Edema   . Allergic rhinitis   . Degenerative joint disease of knee, left   . Hidradenitis   . IBS (irritable bowel syndrome)   . Depression   . Vitamin D deficiency   . Insomnia   . Sleep apnea   . History of colonic polyps   . Sinusitis, chronic   . Hypertension   . Obesity   . Vaginitis, atrophic   . GERD (gastroesophageal reflux disease)   . Allergy     PAST SURGICAL HISTORY: Past Surgical History  Procedure  Laterality Date  . Abdominal hysterectomy    . Cesarean section      x 2  . Cholecystectomy    . Tonsillectomy      SOCIAL HISTORY:  Social History  Substance Use Topics  . Smoking status: Former Research scientist (life sciences)  . Smokeless tobacco: Never Used  . Alcohol Use: No    FAMILY HISTORY:  Family History  Problem Relation Age of Onset  . Rashes / Skin problems Father   . Hypertension Father   . Breast cancer Paternal Grandmother     DRUG ALLERGIES:  Allergies  Allergen Reactions  . Codeine Itching    REVIEW OF SYSTEMS:   CONSTITUTIONAL: No fever, fatigue or weakness.  EYES: No blurred or double vision.  EARS, NOSE, AND THROAT: No tinnitus or ear pain.  RESPIRATORY: No cough,Positive shortness of breath, no wheezing or hemoptysis.  CARDIOVASCULAR: No chest pain, orthopnea, edema.  GASTROINTESTINAL: No nausea, vomiting, diarrhea or abdominal pain.  GENITOURINARY: No dysuria, hematuria.  ENDOCRINE: No polyuria, nocturia,  HEMATOLOGY: No anemia, easy bruising or bleeding SKIN: No rash or lesion. MUSCULOSKELETAL: No joint pain or arthritis.   NEUROLOGIC: No tingling, numbness, weakness.  PSYCHIATRY: No anxiety or depression.   MEDICATIONS AT HOME:  Prior to Admission medications   Medication Sig Start Date End Date Taking? Authorizing Provider  albuterol (PROVENTIL HFA;VENTOLIN HFA) 108 (90 BASE) MCG/ACT inhaler Inhale 2 puffs into the lungs every 4 (four)  hours as needed for wheezing or shortness of breath.   Yes Historical Provider, MD  albuterol (PROVENTIL) (2.5 MG/3ML) 0.083% nebulizer solution Take 2.5 mg by nebulization every 6 (six) hours as needed for wheezing or shortness of breath.    Yes Historical Provider, MD  aspirin EC 81 MG tablet Take 81 mg by mouth at bedtime.   Yes Historical Provider, MD  atorvastatin (LIPITOR) 20 MG tablet Take 20 mg by mouth at bedtime.    Yes Historical Provider, MD  budesonide-formoterol (SYMBICORT) 160-4.5 MCG/ACT inhaler Inhale 2 puffs into  the lungs 2 (two) times daily.   Yes Historical Provider, MD  doxepin (SINEQUAN) 50 MG capsule Take 50 mg by mouth at bedtime.   Yes Historical Provider, MD  DULoxetine (CYMBALTA) 20 MG capsule Take 20 mg by mouth daily.   Yes Historical Provider, MD  furosemide (LASIX) 40 MG tablet Take 40 mg by mouth 2 (two) times daily.   Yes Historical Provider, MD  gabapentin (NEURONTIN) 100 MG capsule Take 200 mg by mouth at bedtime.    Yes Historical Provider, MD  HUMULIN R U-500 KWIKPEN 500 UNIT/ML injection Inject 120 Units into the skin 2 (two) times daily with a meal.    Yes Historical Provider, MD  ketoconazole (NIZORAL) 2 % shampoo Apply 1 application topically 2 (two) times a week.   Yes Historical Provider, MD  levothyroxine (SYNTHROID, LEVOTHROID) 200 MCG tablet Take 200 mcg by mouth daily before breakfast.    Yes Historical Provider, MD  lisinopril-hydrochlorothiazide (PRINZIDE,ZESTORETIC) 10-12.5 MG per tablet Take 1 tablet by mouth daily.   Yes Historical Provider, MD  loratadine (CLARITIN) 10 MG tablet Take 10 mg by mouth daily.   Yes Historical Provider, MD  meclizine (ANTIVERT) 25 MG tablet Take 25 mg by mouth 3 (three) times daily as needed for dizziness.    Yes Historical Provider, MD  mometasone (NASONEX) 50 MCG/ACT nasal spray Place 2 sprays into both nostrils daily.    Yes Historical Provider, MD  montelukast (SINGULAIR) 10 MG tablet Take 10 mg by mouth at bedtime.    Yes Historical Provider, MD  olopatadine (PATANOL) 0.1 % ophthalmic solution Place 1 drop into both eyes 2 (two) times daily.   Yes Historical Provider, MD  ondansetron (ZOFRAN) 8 MG tablet Take 4-8 mg by mouth every 8 (eight) hours as needed for nausea or vomiting.   Yes Historical Provider, MD  pantoprazole (PROTONIX) 40 MG tablet Take 40 mg by mouth daily.   Yes Historical Provider, MD  potassium chloride (K-DUR) 10 MEQ tablet Take 10 mEq by mouth daily.    Yes Historical Provider, MD  TRADJENTA 5 MG TABS tablet Take 5 mg  by mouth daily.    Yes Historical Provider, MD  traZODone (DESYREL) 50 MG tablet Take 50-100 mg by mouth at bedtime as needed for sleep.    Yes Historical Provider, MD  doxycycline (VIBRAMYCIN) 50 MG capsule Take 1 capsule (50 mg total) by mouth 2 (two) times daily. Patient not taking: Reported on 09/08/2015 05/31/15   Jules Husbands, MD      PHYSICAL EXAMINATION:   VITAL SIGNS: Blood pressure 127/74, pulse 93, temperature 98.2 F (36.8 C), temperature source Oral, resp. rate 19, height 5\' 4"  (1.626 m), weight 131.543 kg (290 lb), SpO2 95 %.  GENERAL:  60 y.o.-year-old patient lying in the bed with no acute distress.  EYES: Pupils equal, round, reactive to light and accommodation. No scleral icterus. Extraocular muscles intact.  HEENT: Head atraumatic, normocephalic.  Oropharynx and nasopharynx clear. Face appears swollen. NECK:  Supple, no jugular venous distention. No thyroid enlargement, no tenderness.  LUNGS: Normal breath sounds bilaterally, no wheezing, Some crepitation. No use of accessory muscles of respiration.  CARDIOVASCULAR: S1, S2 normal. No murmurs, rubs, or gallops.  ABDOMEN: Soft, nontender, nondistended. Bowel sounds present. No organomegaly or mass.  EXTREMITIES: Positive pedal edema, no cyanosis, or clubbing.  NEUROLOGIC: Cranial nerves II through XII are intact. Muscle strength 5/5 in all extremities. Sensation intact. Gait not checked.  PSYCHIATRIC: The patient is alert and oriented x 3.  SKIN: No obvious rash, lesion, or ulcer.   LABORATORY PANEL:   CBC  Recent Labs Lab 09/08/15 1621  WBC 14.7*  HGB 12.9  HCT 37.0  PLT 291  MCV 80.2  MCH 28.0  MCHC 34.8  RDW 15.8*   ------------------------------------------------------------------------------------------------------------------  Chemistries   Recent Labs Lab 09/08/15 1621  NA 133*  K 4.5  CL 94*  CO2 28  GLUCOSE 119*  BUN 50*  CREATININE 2.06*  CALCIUM 8.9    ------------------------------------------------------------------------------------------------------------------ estimated creatinine clearance is 39.6 mL/min (by C-G formula based on Cr of 2.06). ------------------------------------------------------------------------------------------------------------------ No results for input(s): TSH, T4TOTAL, T3FREE, THYROIDAB in the last 72 hours.  Invalid input(s): FREET3   Coagulation profile No results for input(s): INR, PROTIME in the last 168 hours. ------------------------------------------------------------------------------------------------------------------- No results for input(s): DDIMER in the last 72 hours. -------------------------------------------------------------------------------------------------------------------  Cardiac Enzymes No results for input(s): CKMB, TROPONINI, MYOGLOBIN in the last 168 hours.  Invalid input(s): CK ------------------------------------------------------------------------------------------------------------------ Invalid input(s): POCBNP  ---------------------------------------------------------------------------------------------------------------  Urinalysis    Component Value Date/Time   COLORURINE STRAW* 09/08/2015 1949   APPEARANCEUR CLEAR* 09/08/2015 1949   LABSPEC 1.006 09/08/2015 1949   PHURINE 5.0 09/08/2015 1949   GLUCOSEU NEGATIVE 09/08/2015 1949   HGBUR NEGATIVE 09/08/2015 1949   BILIRUBINUR NEGATIVE 09/08/2015 1949   KETONESUR NEGATIVE 09/08/2015 1949   PROTEINUR NEGATIVE 09/08/2015 1949   NITRITE NEGATIVE 09/08/2015 1949   LEUKOCYTESUR NEGATIVE 09/08/2015 1949     RADIOLOGY: Dg Chest 2 View  09/08/2015  CLINICAL DATA:  Dizziness, nausea, shortness of breath and fall. Initial encounter. Slightly decreased O2 sats. EXAM: CHEST  2 VIEW COMPARISON:  01/27/2014. FINDINGS: Trachea is midline. Heart size is accentuated by somewhat low lung volumes. There is an overall slight  increase in prominence of the pulmonary markings. No pleural fluid. Soft tissue thickening along the apical and lateral hemithoraces asymmetric can likely get extrapleural fat. IMPRESSION: Mild diffuse prominence of the pulmonary markings may be due to vascular crowding related to low lung volumes. An atypical or viral infectious process cannot be excluded. Electronically Signed   By: Lorin Picket M.D.   On: 09/08/2015 18:02    EKG: Orders placed or performed during the hospital encounter of 09/08/15  . ED EKG  . ED EKG    IMPRESSION AND PLAN:  * Acute renal failure   Hold nephrotoxic medications and antihypertensive medications.   Hold Lasix.   Monitor renal function.  * Atypical pneumonia   IV ceftriaxone and azithromycin.   Sputum culture   Oral steroids.   Pulmonary consult.  * COPD   No signs of acute exacerbation.   Continue nebulizers add oral steroids.  * Generalized edema   Possibly secondary to congestive heart failure   Get echocardiogram.   No Lasix for now due to renal failure.  * Diabetes   She is on very high-dose of insulin.   I explained about diet restrictions and will call dietary  consult.   Decreased dose of insulin as she has renal failure now and she will be on restricted diet in hospital.   Keep on insulin sliding scale coverage.   All the records are reviewed and case discussed with ED provider. Management plans discussed with the patient, family and they are in agreement.  CODE STATUS: full code Code Status History    This patient does not have a recorded code status. Please follow your organizational policy for patients in this situation.     Patient's daughter was present during admission.  TOTAL TIME TAKING CARE OF THIS PATIENT: : 50 minutes.    Vaughan Basta M.D on 09/08/2015   Between 7am to 6pm - Pager - [phone]  After 6pm go to [email or web address] - password [email or web address] [facility]  Office   [phone]  CC: Primary care physician; [name], MD   Note: This dictation was prepared with Dragon dictation along with smaller phrase technology. Any transcriptional errors that result from this process are unintentional.

## 2015-09-08 NOTE — ED Notes (Signed)
Patient transported to X-ray 

## 2015-09-09 ENCOUNTER — Inpatient Hospital Stay
Admit: 2015-09-09 | Discharge: 2015-09-09 | Disposition: A | Discharge status: Home / Self Care | Payer: Medicare Other | Attending: Internal Medicine | Admitting: Internal Medicine

## 2015-09-09 DIAGNOSIS — J189 Pneumonia, unspecified organism: Secondary | ICD-10-CM | POA: Diagnosis not present

## 2015-09-09 LAB — BASIC METABOLIC PANEL
ANION GAP: 6 (ref 5–15)
BUN: 38 mg/dL — ABNORMAL HIGH (ref 6–20)
CHLORIDE: 102 mmol/L (ref 101–111)
CO2: 28 mmol/L (ref 22–32)
Calcium: 7.9 mg/dL — ABNORMAL LOW (ref 8.9–10.3)
Creatinine, Ser: 1.68 mg/dL — ABNORMAL HIGH (ref 0.44–1.00)
GFR, EST AFRICAN AMERICAN: 37 mL/min — AB (ref >60)
GFR, EST NON AFRICAN AMERICAN: 32 mL/min — AB (ref >60)
Glucose, Bld: 342 mg/dL — ABNORMAL HIGH (ref 65–99)
POTASSIUM: 4.6 mmol/L (ref 3.5–5.1)
SODIUM: 136 mmol/L (ref 135–145)

## 2015-09-09 LAB — ECHOCARDIOGRAM COMPLETE
CHL CUP DOP CALC LVOT VTI: 25.1 cm
CHL CUP LVOT MV VTI: 3.03
FS: 41 % (ref 28–44)
Height: 64 in
IVS/LV PW RATIO, ED: 1.08
LA ID, A-P, ES: 44 mm
LA vol A4C: 75.9 ml
LA vol index: 25.1 mL/m2
LADIAMINDEX: 1.75 cm/m2
LAVOL: 63.1 mL
LEFT ATRIUM END SYS DIAM: 44 mm
LV PW d: 13.1 mm — AB (ref 0.6–1.1)
LV TDI E'LATERAL: 10.6
LVELAT: 10.6 cm/s
LVOT MV VTI INDEX: 1.2 cm2/m2
LVOT area: 4.52 cm2
LVOT peak grad rest: 7 mmHg
LVOT peak vel: 133 cm/s
LVOTD: 24 mm
LVOTSV: 113 mL
MV M vel: 124
MVANNULUSVTI: 37.4 cm
MVG: 8 mmHg
WEIGHTICAEL: 4640 [oz_av]

## 2015-09-09 LAB — CBC
HEMATOCRIT: 35.1 % (ref 35.0–47.0)
HEMOGLOBIN: 11.5 g/dL — AB (ref 12.0–16.0)
MCH: 27.4 pg (ref 26.0–34.0)
MCHC: 32.9 g/dL (ref 32.0–36.0)
MCV: 83.1 fL (ref 80.0–100.0)
Platelets: 283 10*3/uL (ref 150–440)
RBC: 4.22 MIL/uL (ref 3.80–5.20)
RDW: 16.4 % — ABNORMAL HIGH (ref 11.5–14.5)
WBC: 12 10*3/uL — AB (ref 3.6–11.0)

## 2015-09-09 LAB — MRSA PCR SCREENING: MRSA by PCR: NEGATIVE

## 2015-09-09 LAB — GLUCOSE, CAPILLARY
GLUCOSE-CAPILLARY: 390 mg/dL — AB (ref 65–99)
Glucose-Capillary: 293 mg/dL — ABNORMAL HIGH (ref 65–99)
Glucose-Capillary: 350 mg/dL — ABNORMAL HIGH (ref 65–99)
Glucose-Capillary: 370 mg/dL — ABNORMAL HIGH (ref 65–99)

## 2015-09-09 LAB — TROPONIN I: Troponin I: <0.03 ng/mL (ref <0.031)

## 2015-09-09 MED ORDER — SODIUM CHLORIDE 0.9 % IV BOLUS (SEPSIS)
1000.0000 mL | Freq: Once | INTRAVENOUS | Status: AC
  Administered 2015-09-09: 1000 mL via INTRAVENOUS

## 2015-09-09 MED ORDER — INSULIN ASPART 100 UNIT/ML ~~LOC~~ SOLN
0.0000 [IU] | Freq: Every day | SUBCUTANEOUS | Status: DC
  Administered 2015-09-09: 5 [IU] via SUBCUTANEOUS
  Filled 2015-09-09: qty 5

## 2015-09-09 MED ORDER — ACETAMINOPHEN 325 MG PO TABS
650.0000 mg | ORAL_TABLET | Freq: Four times a day (QID) | ORAL | Status: DC | PRN
  Administered 2015-09-09 – 2015-09-10 (×2): 650 mg via ORAL
  Filled 2015-09-09 (×3): qty 2

## 2015-09-09 MED ORDER — INSULIN REGULAR HUMAN (CONC) 500 UNIT/ML ~~LOC~~ SOPN
60.0000 [IU] | PEN_INJECTOR | Freq: Two times a day (BID) | SUBCUTANEOUS | Status: DC
  Administered 2015-09-09: 60 [IU] via SUBCUTANEOUS
  Filled 2015-09-09: qty 3

## 2015-09-09 MED ORDER — INSULIN ASPART 100 UNIT/ML ~~LOC~~ SOLN
0.0000 [IU] | Freq: Three times a day (TID) | SUBCUTANEOUS | Status: DC
  Administered 2015-09-09: 20 [IU] via SUBCUTANEOUS
  Administered 2015-09-09: 15 [IU] via SUBCUTANEOUS
  Administered 2015-09-10: 20 [IU] via SUBCUTANEOUS
  Administered 2015-09-10: 11 [IU] via SUBCUTANEOUS
  Filled 2015-09-09: qty 11
  Filled 2015-09-09: qty 20
  Filled 2015-09-09: qty 15
  Filled 2015-09-09: qty 20

## 2015-09-09 MED ORDER — LINAGLIPTIN 5 MG PO TABS
5.0000 mg | ORAL_TABLET | Freq: Every day | ORAL | Status: DC
  Administered 2015-09-09 – 2015-09-10 (×2): 5 mg via ORAL
  Filled 2015-09-09 (×2): qty 1

## 2015-09-09 MED ORDER — ENOXAPARIN SODIUM 40 MG/0.4ML ~~LOC~~ SOLN
40.0000 mg | Freq: Two times a day (BID) | SUBCUTANEOUS | Status: DC
  Administered 2015-09-09 – 2015-09-10 (×2): 40 mg via SUBCUTANEOUS
  Filled 2015-09-09 (×2): qty 0.4

## 2015-09-09 MED ORDER — DEXTROSE 5 % IV SOLN
500.0000 mg | Freq: Every day | INTRAVENOUS | Status: DC
  Administered 2015-09-09: 500 mg via INTRAVENOUS
  Filled 2015-09-09 (×2): qty 500

## 2015-09-09 NOTE — Progress Notes (Signed)
Notified Dr. Claria Dice about pt's bp upon admit.  Retakes showed bp of 95/35, 84/30, 82/28.  Pressures were taken on bilateral arms, legs.  Machines were changed out to verify pressure.  Per MD, give one more fluid bolus, NS and monitor.

## 2015-09-09 NOTE — Progress Notes (Signed)
  Echocardiogram 2D Echocardiogram with Definity has been performed.  Jennette Dubin 09/09/2015, 11:49 AM

## 2015-09-09 NOTE — Progress Notes (Signed)
[facility] [place] NAME: [name] [name]    MR#:  [id number]  DATE OF BIRTH:  1955/09/01  SUBJECTIVE:   She is here due to acute renal failure and also noted to have pneumonia. Patient complaining of pain all over. Denies any nausea, vomiting, chest pain, worsening shortness of breath.   REVIEW OF SYSTEMS:    Review of Systems  Constitutional: Negative for fever and chills.  HENT: Negative for congestion and tinnitus.   Eyes: Negative for blurred vision and double vision.  Respiratory: Negative for cough, shortness of breath and wheezing.   Cardiovascular: Negative for chest pain, orthopnea and PND.  Gastrointestinal: Negative for nausea, vomiting, abdominal pain and diarrhea.  Genitourinary: Negative for dysuria and hematuria.  Neurological: Positive for weakness. Negative for dizziness, sensory change and focal weakness.  All other systems reviewed and are negative.   Nutrition: Heart Healthy/Carb control Tolerating Diet: yes Tolerating PT: Await Eval.    DRUG ALLERGIES:   Allergies  Allergen Reactions  . Codeine Itching    VITALS:  Blood pressure 124/49, pulse 105, temperature 98.4 F (36.9 C), temperature source Oral, resp. rate 19, height 5\' 4"  (1.626 m), weight 131.543 kg (290 lb), SpO2 94 %.  PHYSICAL EXAMINATION:   Physical Exam  GENERAL:  61 y.o.-year-old obese patient lying in the bed in no acute distress.  EYES: Pupils equal, round, reactive to light and accommodation. No scleral icterus. Extraocular muscles intact.  HEENT: Head atraumatic, normocephalic. Oropharynx and nasopharynx clear.  NECK:  Supple, no jugular venous distention. No thyroid enlargement, no tenderness.  LUNGS: Normal breath sounds bilaterally, no wheezing, rales, rhonchi. No use of accessory muscles of respiration.  CARDIOVASCULAR: S1, S2 normal. No murmurs, rubs, or gallops.  ABDOMEN: Soft, nontender, nondistended. Bowel sounds present. No  organomegaly or mass.  EXTREMITIES: No cyanosis, clubbing, + f1 edema b/l.    NEUROLOGIC: Cranial nerves II through XII are intact. No focal Motor or sensory deficits b/l. Globally weak  PSYCHIATRIC: The patient is alert and oriented x 3.  SKIN: No obvious rash, lesion, or ulcer.    LABORATORY PANEL:   CBC  Recent Labs Lab 09/09/15 0559  WBC 12.0*  HGB 11.5*  HCT 35.1  PLT 283   ------------------------------------------------------------------------------------------------------------------  Chemistries   Recent Labs Lab 09/09/15 0559  NA 136  K 4.6  CL 102  CO2 28  GLUCOSE 342*  BUN 38*  CREATININE 1.68*  CALCIUM 7.9*   ------------------------------------------------------------------------------------------------------------------  Cardiac Enzymes  Recent Labs Lab 09/08/15 1621  TROPONINI <0.03   ------------------------------------------------------------------------------------------------------------------  RADIOLOGY:  Dg Chest 2 View  09/08/2015  CLINICAL DATA:  Dizziness, nausea, shortness of breath and fall. Initial encounter. Slightly decreased O2 sats. EXAM: CHEST  2 VIEW COMPARISON:  01/27/2014. FINDINGS: Trachea is midline. Heart size is accentuated by somewhat low lung volumes. There is an overall slight increase in prominence of the pulmonary markings. No pleural fluid. Soft tissue thickening along the apical and lateral hemithoraces asymmetric can likely get extrapleural fat. IMPRESSION: Mild diffuse prominence of the pulmonary markings may be due to vascular crowding related to low lung volumes. An atypical or viral infectious process cannot be excluded. Electronically Signed   By: Lorin Picket M.D.   On: 09/08/2015 18:02     ASSESSMENT AND PLAN:   60 year old female with past medical history of COPD, diabetes type 2, essential hypertension, IBS, depression, obstructive sleep apnea, obesity, GERD, DJD of presents to the hospital due to  syncope  and nausea and noted to be in acute on chronic renal failure and noted to have atypical pneumonia.  1. Acute on chronic renal failure-secondary to dehydration and over diuresis with diuretics. -Getting gentle IV fluids and renal function is improving. -Hold Lasix, Cipro, HCTZ. -Follow BUN/creatinine renal dose meds avoid nephrotoxins.  2. Atypical pneumonia-this was noted on the chest x-ray on admission. Clinically patient is afebrile and hemodynamically stable. -Continue ceftriaxone, Zithromax. Follow cultures which are negative so far.  3. COPD-no acute exacerbation. -Continue albuterol nebulizers as needed, Dulera.  4. Diabetes type 2 without complication-continue Lantus, U5 100 insulin, NovoLog sliding scale. -Continue Tradjenta, appreciate diabetic coordinator input.  5. Hypothyroidism-continue Synthroid.  6. Diabetic neuropathy-continue gabapentin.  7. depression-continue Cymbalta.  8. Hyperlipidemia - cont. Atorvastatin.      All the records are reviewed and case discussed with Care Management/Social Workerr. Management plans discussed with the patient, family and they are in agreement.  CODE STATUS: Full Code  DVT Prophylaxis: Lovenox  TOTAL TIME TAKING CARE OF THIS PATIENT: 30 minutes.   POSSIBLE D/C IN 1-2 DAYS, DEPENDING ON CLINICAL CONDITION.   Henreitta Leber M.D on 09/09/2015 at 2:31 PM  Between 7am to 6pm - Pager - [phone]  After 6pm go to [email or web address] - password [email or web address] [facility]  Office  [phone]  CC: Primary care physician; [name], MD

## 2015-09-09 NOTE — Progress Notes (Signed)
PT Cancellation Note  Patient Details Name: [name] [name] MRN: [id number] DOB: Jun 10, 1955   Cancelled Treatment:    Reason Eval/Treat Not Completed: Medical issues which prohibited therapy. Pt's chart reviewed. Her last glucose reading was 390. Per PT protocol pt isn't appropriate to participate if over 300. PT will f/u and complete evaluation when appropriate.   Neoma Laming, PT, DPT  09/09/2015, 2:23 PM [phone]

## 2015-09-09 NOTE — Care Management Note (Signed)
Case Management Note  Patient Details  Name: [name] [name] MRN: [id number] Date of Birth: 12-25-1955  Subjective/Objective:                   Met with patient to discuss discharge planning. She is from home alone-drives. She has recently required a 4-wheeled walker to ambulate with. She wants a rollator for grocery shopping. She is not on O2 at home. PCP is with [facility]  [phone]. Cancelled appointment that she had with them today- rescheduled July 3 1:00PM.She also obtains her meds from them. She has a nebulizer that works and has meds to go in it. She denies difficulty obtaining meds. She states she does have trouble with mobility.  Action/Plan: List of home health agencies left with patient. PT requested. RNCM will continue to follow.    Expected Discharge Date:                  Expected Discharge Plan:     In-House Referral:     Discharge planning Services  CM Consult  Post Acute Care Choice:    Choice offered to:  Patient  DME Arranged:  [facility] DME Agency:  [place]:    [place] Agency:     Status of Service:  In process, will continue to follow  If discussed at Long Length of Stay Meetings, dates discussed:    Additional Comments:  Marshell Garfinkel, RN 09/09/2015, 11:07 AM

## 2015-09-09 NOTE — Plan of Care (Signed)
Problem: Food- and Nutrition-Related Knowledge Deficit (NB-1.1) Goal: Nutrition education Formal process to instruct or train a patient/client in a skill or to impart knowledge to help patients/clients voluntarily manage or modify food choices and eating behavior to maintain or improve health.  Outcome: Completed/Met Date Met:  09/09/15  RD consulted for nutrition education regarding diabetes.     Lab Results  Component Value Date    HGBA1C 10.0* 09/18/2012    RD provided "Carbohydrate Counting for People with Diabetes" handout from the Academy of Nutrition and Dietetics. Discussed different food groups and their effects on blood sugar, emphasizing carbohydrate-containing foods. Provided list of carbohydrates and recommended serving sizes of common foods.  Discussed importance of controlled and consistent carbohydrate intake throughout the day. Provided examples of ways to balance meals/snacks and encouraged intake of high-fiber, whole grain complex carbohydrates. Teach back method used.  Expect fair compliance.  Body mass index is 49.75 kg/(m^2). Pt meets criteria for morbid obesity based on current BMI.  Current diet order is heart healthy, patient is consuming approximately 100% of meals at this time. Labs and medications reviewed. No further nutrition interventions warranted at this time. RD contact information provided. If additional nutrition issues arise, please re-consult RD.  Dwyane Luo, RD, LDN Pager [phone] Weekend/On-Call Pager [phone]

## 2015-09-09 NOTE — Progress Notes (Signed)
Patient with complaints of IV to left is tender, relocated to left arm. Cardiac monitoring discontinued as well

## 2015-09-09 NOTE — Progress Notes (Signed)
Inpatient Diabetes Program Recommendations  AACE/ADA: New Consensus Statement on Inpatient Glycemic Control (2015)  Target Ranges:  Prepandial:   less than 140 mg/dL      Peak postprandial:   less than 180 mg/dL (1-2 hours)      Critically ill patients:  140 - 180 mg/dL   Lab Results  Component Value Date   GLUCAP 293* 09/09/2015   HGBA1C 10.0* 09/18/2012    Review of Glycemic Control  Results for KARLIAH, SVITAK (MRN [id number]) as of 09/09/2015 09:47  Ref. Range 09/08/2015 16:11 09/08/2015 21:58 09/09/2015 08:25  Glucose-Capillary Latest Ref Range: 65-99 mg/dL 116 (H) 92 293 (H)    Diabetes history: Type 2 diabetes Outpatient Diabetes medications: Humulin N U500 120 units bid, Tradgenta 5mg /day Current orders for Inpatient glycemic control: Lantus 20 units bid, Novolog 0-9 units tid, * prednisone 50mg  with breakfast  Inpatient Diabetes Program Recommendations:     Patient takes 120 units of U500 2x/day at home.  [place] now carries U500 in the pharmacy.  Please consider starting 60 units U500 insulin bid.   Increase Novolog correction insulin to resistant scale (0-20 units tid)  Please add Novolog correction 0-5 units qhs  Please add Tradgenta 5mg /day  Gentry Fitz, RN, [place], [place], CDE Diabetes Coordinator Inpatient Diabetes Program  [phone] (Team Pager) [phone] ([place]) 09/09/2015 10:02 AM

## 2015-09-10 DIAGNOSIS — J9600 Acute respiratory failure, unspecified whether with hypoxia or hypercapnia: Secondary | ICD-10-CM | POA: Diagnosis present

## 2015-09-10 DIAGNOSIS — J189 Pneumonia, unspecified organism: Secondary | ICD-10-CM | POA: Diagnosis not present

## 2015-09-10 LAB — GLUCOSE, CAPILLARY
Glucose-Capillary: 266 mg/dL — ABNORMAL HIGH (ref 65–99)
Glucose-Capillary: 392 mg/dL — ABNORMAL HIGH (ref 65–99)
Glucose-Capillary: 397 mg/dL — ABNORMAL HIGH (ref 65–99)
Glucose-Capillary: 388 mg/dL — ABNORMAL HIGH (ref 65–99)

## 2015-09-10 LAB — BASIC METABOLIC PANEL
Anion gap: 8 (ref 5–15)
BUN: 20 mg/dL (ref 6–20)
CALCIUM: 8.7 mg/dL — AB (ref 8.9–10.3)
CO2: 29 mmol/L (ref 22–32)
CREATININE: 1 mg/dL (ref 0.44–1.00)
Chloride: 98 mmol/L — ABNORMAL LOW (ref 101–111)
GFR calc Af Amer: >60 mL/min (ref >60)
GFR calc non Af Amer: >60 mL/min (ref >60)
GLUCOSE: 292 mg/dL — AB (ref 65–99)
Potassium: 4.6 mmol/L (ref 3.5–5.1)
Sodium: 135 mmol/L (ref 135–145)

## 2015-09-10 LAB — URINE CULTURE: CULTURE: NO GROWTH

## 2015-09-10 MED ORDER — LEVOFLOXACIN 500 MG PO TABS: 500.0000 mg | ORAL_TABLET | Freq: Every day | ORAL | Status: DC

## 2015-09-10 MED ORDER — INSULIN REGULAR HUMAN (CONC) 500 UNIT/ML ~~LOC~~ SOPN
100.0000 [IU] | PEN_INJECTOR | Freq: Two times a day (BID) | SUBCUTANEOUS | Status: DC
  Filled 2015-09-10: qty 3

## 2015-09-10 MED ORDER — INSULIN REGULAR HUMAN (CONC) 500 UNIT/ML ~~LOC~~ SOPN
100.0000 [IU] | PEN_INJECTOR | Freq: Two times a day (BID) | SUBCUTANEOUS | Status: DC
  Administered 2015-09-10: 100 [IU] via SUBCUTANEOUS
  Filled 2015-09-10: qty 3

## 2015-09-10 NOTE — Evaluation (Signed)
Physical Therapy Evaluation Patient Details Name: [name] [name] MRN: [id number] DOB: 24-Nov-1955 Today's Date: 09/10/2015   History of Present Illness  Pt is a 60 y/o female that presents with nausea, syncope, shortness of breath. Found to have acute on chronic renal failure, atypical pneumonia, has been hypotensive.   Clinical Impression  Patient reports she has chronic dizziness, light-headed feeling, her BP remains stable in this session. She reports some falls, appear related to orthostatic hypotension? She demonstrates short step lengths initially with gait, provided with bariatric RW and was able to increase her stride length. Of note her O2 sats declined from 91% on 2L at rest to 84% 2L, returned to baseline within 30". She appears to be deconditioned at baseline and would benefit from RW and HHPT to increase her independence with mobility.     Follow Up Recommendations Home health PT    Equipment Recommendations  Rolling walker with 5" wheels (Bariatric RW)    Recommendations for Other Services       Precautions / Restrictions Precautions Precautions: Fall Restrictions Weight Bearing Restrictions: No      Mobility  Bed Mobility               General bed mobility comments: Patient seated at EOB when PT arrived.   Transfers Overall transfer level: Independent               General transfer comment: No deficits noted in sit to stand.   Ambulation/Gait Ambulation/Gait assistance: Supervision Ambulation Distance (Feet): 100 Feet Assistive device: Rolling walker (2 wheeled) Gait Pattern/deviations: WFL(Within Functional Limits);Trunk flexed   Gait velocity interpretation: Below normal speed for age/gender General Gait Details: Patient initially seen taking short steps w/o AD, she asked for use of bariatric RW, with such she demonstrates increased stride lengths. Of note her O2 sats declined from 91% to 84% on 2L during ambulation, HR increased to 120. Both  returned to baseline within 30" of sitting.   Stairs            Wheelchair Mobility    Modified Rankin (Stroke Patients Only)       Balance Overall balance assessment: Needs assistance Sitting-balance support: No upper extremity supported Sitting balance-Leahy Scale: Good     Standing balance support: Bilateral upper extremity supported Standing balance-Leahy Scale: Fair                               Pertinent Vitals/Pain Pain Assessment:  (Patient denies chest pain)    Home Living Family/patient expects to be discharged to:: Private residence Living Arrangements: Alone Available Help at Discharge:  (Family is in town, unclear of how much they visit. It sounds like they could help if needed.) Type of Home: House Home Access:  (Does not specify)              Prior Function Level of Independence: Independent         Comments: Patient has had instances of syncope, some reports of falls or near falls.      Hand Dominance        Extremity/Trunk Assessment   Upper Extremity Assessment: Overall WFL for tasks assessed           Lower Extremity Assessment: Overall WFL for tasks assessed         Communication   Communication: No difficulties  Cognition Arousal/Alertness: Awake/alert Behavior During Therapy: WFL for tasks assessed/performed Overall Cognitive Status: Within Functional  Limits for tasks assessed                      General Comments      Exercises Other Exercises Other Exercises: Assisted patient with BRW to bathroom commode.       Assessment/Plan    PT Assessment Patient needs continued PT services  PT Diagnosis Difficulty walking;Generalized weakness   PT Problem List Decreased strength;Decreased mobility;Cardiopulmonary status limiting activity;Decreased balance;Decreased activity tolerance  PT Treatment Interventions DME instruction;Therapeutic activities;Therapeutic exercise;Gait training;Balance  training   PT Goals (Current goals can be found in the Care Plan section) Acute Rehab PT Goals Patient Stated Goal: To return to home  PT Goal Formulation: With patient Time For Goal Achievement: 09/24/15 Potential to Achieve Goals: Good    Frequency Min 2X/week   Barriers to discharge        Co-evaluation               End of Session Equipment Utilized During Treatment: Oxygen;Gait belt Activity Tolerance: Patient tolerated treatment well;Patient limited by fatigue Patient left: in chair;with call bell/phone within reach;with chair alarm set Nurse Communication: Mobility status (O2 sats)         Time: [id number] PT Time Calculation (min) (ACUTE ONLY): 29 min   Charges:   PT Evaluation $PT Eval Moderate Complexity: 1 Procedure      PT G Codes:       Kerman Passey, PT, DPT    09/10/2015, 12:44 PM

## 2015-09-10 NOTE — NC FL2 (Signed)
[place] LEVEL OF CARE SCREENING TOOL     IDENTIFICATION  Patient Name: [name] [name] Birthdate: 1955-05-27 Sex: female Admission Date (Current Location): 09/08/2015  [place] and [place] Number:  Engineering geologist and Address:  [facility], [address] [place], [place] [address]      Provider Number: [id number]  Attending Physician Name and Address:  Henreitta Leber, MD  Relative Name and Phone Number:       Current Level of Care: Hospital Recommended Level of Care: [place] Prior Approval Number:    Date Approved/Denied:   PASRR Number:  ([id number])  Discharge Plan: SNF    Current Diagnoses: Patient Active Problem List   Diagnosis Date Noted  . Pneumonia 09/08/2015  . Breast abscess 06/02/2015  . Abnormal mammogram of both breasts 02/25/2015  . Hidradenitis 12/30/2014  . Diabetes ([place]) 12/30/2014  . Essential hypertension 12/30/2014  . Asthma 12/30/2014    Orientation RESPIRATION BLADDER Height & Weight     Self, Time, Situation, Place  O2 (2 Liters Oxygen ) Continent Weight: 290 lb (131.543 kg) Height:  5\' 4"  (162.6 cm)  BEHAVIORAL SYMPTOMS/MOOD NEUROLOGICAL BOWEL NUTRITION STATUS   (none )  (none ) Continent Diet (Diet: Heart Healthy/ Carb Modified )  AMBULATORY STATUS COMMUNICATION OF NEEDS Skin   Extensive Assist Verbally Normal                       Personal Care Assistance Level of Assistance  Bathing, Feeding, Dressing Bathing Assistance: Limited assistance Feeding assistance: Independent Dressing Assistance: Limited assistance     Functional Limitations Info  Sight, Hearing, Speech Sight Info: Adequate Hearing Info: Adequate Speech Info: Adequate    SPECIAL CARE FACTORS FREQUENCY  PT (By licensed PT), OT (By licensed OT)     PT Frequency:  (5) OT Frequency:  (5)            Contractures      Additional Factors Info  Code Status, Allergies, Insulin Sliding Scale  Code Status Info:  (Full Code. ) Allergies Info:  (Codeine )   Insulin Sliding Scale Info:  (NovoLog Insulin Injections 3 times per day. )       Current Medications (09/10/2015):  This is the current hospital active medication list Current Facility-Administered Medications  Medication Dose Route Frequency Provider Last Rate Last Dose  . acetaminophen (TYLENOL) tablet 650 mg  650 mg Oral Q6H PRN Quintella Baton, MD   650 mg at 09/10/15 0857  . albuterol (PROVENTIL) (2.5 MG/3ML) 0.083% nebulizer solution 2.5 mg  2.5 mg Nebulization Q6H PRN Vaughan Basta, MD      . aspirin EC tablet 81 mg  81 mg Oral QHS Vaughan Basta, MD   81 mg at 09/09/15 2138  . atorvastatin (LIPITOR) tablet 20 mg  20 mg Oral QHS Vaughan Basta, MD   20 mg at 09/09/15 2129  . azithromycin (ZITHROMAX) 500 mg in dextrose 5 % 250 mL IVPB  500 mg Intravenous QHS Debby Crosley, MD   500 mg at 09/09/15 0434  . cefTRIAXone (ROCEPHIN) 1 g in dextrose 5 % 50 mL IVPB  1 g Intravenous QHS Vaughan Basta, MD   1 g at 09/09/15 2130  . doxepin (SINEQUAN) capsule 50 mg  50 mg Oral QHS Vaughan Basta, MD   50 mg at 09/09/15 2129  . DULoxetine (CYMBALTA) DR capsule 20 mg  20 mg Oral Daily Vaughan Basta, MD   20 mg at  09/10/15 0857  . enoxaparin (LOVENOX) injection 40 mg  40 mg Subcutaneous Q12H Darylene Price Gays, RPH   40 mg at 09/10/15 [id number]  . fluticasone (FLONASE) 50 MCG/ACT nasal spray 1 spray  1 spray Each Nare Daily Vaughan Basta, MD   1 spray at 09/10/15 0850  . gabapentin (NEURONTIN) capsule 200 mg  200 mg Oral QHS Vaughan Basta, MD   200 mg at 09/09/15 2129  . insulin aspart (novoLOG) injection 0-20 Units  0-20 Units Subcutaneous TID WC Henreitta Leber, MD   11 Units at 09/10/15 [phone]  . insulin aspart (novoLOG) injection 0-5 Units  0-5 Units Subcutaneous QHS Henreitta Leber, MD   5 Units at 09/09/15 2128  . insulin regular human CONCENTRATED (HUMULIN R) 500 UNIT/ML kwikpen 100  Units  100 Units Subcutaneous BID WC Henreitta Leber, MD   100 Units at 09/10/15 0857  . levothyroxine (SYNTHROID, LEVOTHROID) tablet 200 mcg  200 mcg Oral QAC breakfast Vaughan Basta, MD   200 mcg at 09/10/15 0857  . linagliptin (TRADJENTA) tablet 5 mg  5 mg Oral Daily Henreitta Leber, MD   5 mg at 09/10/15 0858  . loratadine (CLARITIN) tablet 10 mg  10 mg Oral Daily Vaughan Basta, MD   10 mg at 09/10/15 0858  . meclizine (ANTIVERT) tablet 25 mg  25 mg Oral TID PRN Vaughan Basta, MD      . mometasone-formoterol (DULERA) 200-5 MCG/ACT inhaler 2 puff  2 puff Inhalation BID Vaughan Basta, MD   2 puff at 09/10/15 0851  . montelukast (SINGULAIR) tablet 10 mg  10 mg Oral QHS Vaughan Basta, MD   10 mg at 09/09/15 2129  . olopatadine (PATANOL) 0.1 % ophthalmic solution 1 drop  1 drop Both Eyes BID Vaughan Basta, MD   1 drop at 09/10/15 0851  . ondansetron (ZOFRAN) tablet 4-8 mg  4-8 mg Oral Q8H PRN Vaughan Basta, MD      . pantoprazole (PROTONIX) EC tablet 40 mg  40 mg Oral QAC breakfast Vaughan Basta, MD   40 mg at 09/10/15 0858  . potassium chloride (K-DUR,KLOR-CON) CR tablet 10 mEq  10 mEq Oral Daily Vaughan Basta, MD   10 mEq at 09/09/15 1025  . predniSONE (DELTASONE) tablet 50 mg  50 mg Oral Q breakfast Vaughan Basta, MD   50 mg at 09/10/15 0858  . sodium chloride 0.9 % bolus 1,000 mL  1,000 mL Intravenous Once Nance Pear, MD   1,000 mL at 09/08/15 1900  . sodium chloride flush (NS) 0.9 % injection 3 mL  3 mL Intravenous Q12H Vaughan Basta, MD   3 mL at 09/09/15 2139  . traZODone (DESYREL) tablet 50-100 mg  50-100 mg Oral QHS PRN Vaughan Basta, MD         Discharge Medications: Please see discharge summary for a list of discharge medications.  Relevant Imaging Results:  Relevant Lab Results:   Additional Information  (SSN: [id number])  Loralyn Freshwater, LCSW

## 2015-09-10 NOTE — Progress Notes (Addendum)
SATURATION QUALIFICATIONS: (This note is used to comply with regulatory documentation for home oxygen)  Patient Saturations on Room Air at Rest = 88%  Patient Saturations on Room Air while Ambulating = 82 %  Patient Saturations on 2 Liters of oxygen while Ambulating = 92%  Please briefly explain why patient needs home oxygen: Desat with activity

## 2015-09-10 NOTE — Care Management Important Message (Signed)
Important Message  Patient Details  Name: [name] [name] MRN: [id number] Date of Birth: April 28, 1955   Medicare Important Message Given:  Yes    Juliann Pulse A Arleigh Odowd 09/10/2015, 10:24 AM

## 2015-09-10 NOTE — Progress Notes (Signed)
PT is recommending home health. RN Case Manager is aware of above. Please reconsult if future social work needs arise. CSW signing off.   Blima Rich, LCSW [phone]

## 2015-09-10 NOTE — Discharge Summary (Signed)
[place] [facility] [place] NAME: [name] [name]    MR#:  [id number]  DATE OF BIRTH:  [birth year]  DATE OF ADMISSION:  09/08/2015 ADMITTING PHYSICIAN: Vaughan Basta, MD  DATE OF DISCHARGE: 09/10/2015  PRIMARY CARE PHYSICIAN: [name], MD    ADMISSION DIAGNOSIS:  Community acquired pneumonia [J18.9] Hypoxia [R09.02] Sepsis, due to unspecified organism ([place]) [A41.9]  DISCHARGE DIAGNOSIS:  Principal Problem:   Pneumonia Active Problems:   Acute respiratory failure ([place])   SECONDARY DIAGNOSIS:   Past Medical History  Diagnosis Date  . Diabetes mellitus without complication ([place])   . Thyroid disease   . COPD (chronic obstructive pulmonary disease) ([place])   . Vertigo   . Edema   . Allergic rhinitis   . Degenerative joint disease of knee, left   . Hidradenitis   . IBS (irritable bowel syndrome)   . Depression   . Vitamin D deficiency   . Insomnia   . Sleep apnea   . History of colonic polyps   . Sinusitis, chronic   . Hypertension   . Obesity   . Vaginitis, atrophic   . GERD (gastroesophageal reflux disease)   . Allergy     HOSPITAL COURSE:   60 year old female with past medical history of COPD, diabetes type 2, essential hypertension, IBS, depression, obstructive sleep apnea, obesity, GERD, DJD of presents to the hospital due to syncope and nausea and noted to be in acute on chronic renal failure and noted to have atypical pneumonia.  1. Acute on chronic renal failure-secondary to dehydration and over diuresis with diuretics. -Patient was admitted to the hospital and give gentle IV fluids and BUN and creatinine is much improved and now back to baseline. -Her lisinopril HCTZ has now been resumed upon discharge which was held while she was in the hospital.  2. Atypical pneumonia-this was noted on the chest x-ray on admission. Clinically patient did complain of mild shortness of breath but remained afebrile and hemodynamically  stable. -Empirically patient was treated with IV ceftriaxone and Zithromax while in the hospital. She is not being discharged on oral Levaquin. She is clinically stable  3. COPD-she had no acute exacerbation while in the hospital.   - she will cont. Symbicort, albuterol inhaler, nebs upon discharge.  - she was assessed form O2 which she qualified for and was arranged for her prior to discharge.   4. Diabetes type 2 without complication- While in the hospital pt. Was seen by diabetes coordinator hwo help manage her diabetes.  - pt. Will cont. Her U500, Tradjenta upon discharge and follow up with her PCP.   5. Hypothyroidism- She will continue Synthroid.  6. Diabetic neuropathy- She will continue gabapentin.  7. depression- she will continue Cymbalta.  8. Hyperlipidemia - she will cont. Atorvastatin.   Patient was seen by physical therapy and they recommended home health services and she was arranged for home health nursing, physical therapy, aid upon discharge.  DISCHARGE CONDITIONS:   Stable  CONSULTS OBTAINED:  Treatment Team:  Allyne Gee, MD  DRUG ALLERGIES:   Allergies  Allergen Reactions  . Codeine Itching    DISCHARGE MEDICATIONS:   Current Discharge Medication List    START taking these medications   Details  levofloxacin (LEVAQUIN) 500 MG tablet Take 1 tablet (500 mg total) by mouth daily. Qty: 5 tablet, Refills: 0      CONTINUE these medications which have NOT CHANGED   Details  albuterol (PROVENTIL HFA;VENTOLIN HFA) 108 (  90 BASE) MCG/ACT inhaler Inhale 2 puffs into the lungs every 4 (four) hours as needed for wheezing or shortness of breath.    albuterol (PROVENTIL) (2.5 MG/3ML) 0.083% nebulizer solution Take 2.5 mg by nebulization every 6 (six) hours as needed for wheezing or shortness of breath.     aspirin EC 81 MG tablet Take 81 mg by mouth at bedtime.    atorvastatin (LIPITOR) 20 MG tablet Take 20 mg by mouth at bedtime.      budesonide-formoterol (SYMBICORT) 160-4.5 MCG/ACT inhaler Inhale 2 puffs into the lungs 2 (two) times daily.    doxepin (SINEQUAN) 50 MG capsule Take 50 mg by mouth at bedtime.    DULoxetine (CYMBALTA) 20 MG capsule Take 20 mg by mouth daily.    furosemide (LASIX) 40 MG tablet Take 40 mg by mouth 2 (two) times daily.    gabapentin (NEURONTIN) 100 MG capsule Take 200 mg by mouth at bedtime.     HUMULIN R U-500 KWIKPEN 500 UNIT/ML injection Inject 120 Units into the skin 2 (two) times daily with a meal.  Refills: 1    ketoconazole (NIZORAL) 2 % shampoo Apply 1 application topically 2 (two) times a week.    levothyroxine (SYNTHROID, LEVOTHROID) 200 MCG tablet Take 200 mcg by mouth daily before breakfast.  Refills: 1    lisinopril-hydrochlorothiazide (PRINZIDE,ZESTORETIC) 10-12.5 MG per tablet Take 1 tablet by mouth daily.    loratadine (CLARITIN) 10 MG tablet Take 10 mg by mouth daily.    meclizine (ANTIVERT) 25 MG tablet Take 25 mg by mouth 3 (three) times daily as needed for dizziness.     mometasone (NASONEX) 50 MCG/ACT nasal spray Place 2 sprays into both nostrils daily.     montelukast (SINGULAIR) 10 MG tablet Take 10 mg by mouth at bedtime.  Refills: 1    olopatadine (PATANOL) 0.1 % ophthalmic solution Place 1 drop into both eyes 2 (two) times daily.    ondansetron (ZOFRAN) 8 MG tablet Take 4-8 mg by mouth every 8 (eight) hours as needed for nausea or vomiting.    pantoprazole (PROTONIX) 40 MG tablet Take 40 mg by mouth daily.    potassium chloride (K-DUR) 10 MEQ tablet Take 10 mEq by mouth daily.  Refills: 1    TRADJENTA 5 MG TABS tablet Take 5 mg by mouth daily.  Refills: 1    traZODone (DESYREL) 50 MG tablet Take 50-100 mg by mouth at bedtime as needed for sleep.       STOP taking these medications     doxycycline (VIBRAMYCIN) 50 MG capsule          DISCHARGE INSTRUCTIONS:   DIET:  Cardiac diet and Diabetic diet  DISCHARGE CONDITION:   Stable  ACTIVITY:  Activity as tolerated  OXYGEN:  Home Oxygen: Yes.     Oxygen Delivery: 2 liters/min via Patient connected to nasal cannula oxygen  DISCHARGE LOCATION:  Home with Home health nursing, PT, Aid.    If you experience worsening of your admission symptoms, develop shortness of breath, life threatening emergency, suicidal or homicidal thoughts you must seek medical attention immediately by calling 911 or calling your MD immediately  if symptoms less severe.  You Must read complete instructions/literature along with all the possible adverse reactions/side effects for all the Medicines you take and that have been prescribed to you. Take any new Medicines after you have completely understood and accpet all the possible adverse reactions/side effects.   Please note  You were cared for by a  hospitalist during your hospital stay. If you have any questions about your discharge medications or the care you received while you were in the hospital after you are discharged, you can call the unit and asked to speak with the hospitalist on call if the hospitalist that took care of you is not available. Once you are discharged, your primary care physician will handle any further medical issues. Please note that NO REFILLS for any discharge medications will be authorized once you are discharged, as it is imperative that you return to your primary care physician (or establish a relationship with a primary care physician if you do not have one) for your aftercare needs so that they can reassess your need for medications and monitor your lab values.     Today   Shortness of breath improved.  Renal function improved.  A bit diaphoretic today.    VITAL SIGNS:  Blood pressure 125/74, pulse 101, temperature 97.8 F (36.6 C), temperature source Oral, resp. rate 20, height 5\' 4"  (1.626 m), weight 131.543 kg (290 lb), SpO2 95 %.  I/O:   Intake/Output Summary (Last 24 hours) at 09/10/15  1519 Last data filed at 09/10/15 1300  Gross per 24 hour  Intake 3760.79 ml  Output      0 ml  Net 3760.79 ml    PHYSICAL EXAMINATION:   GENERAL: 60 y.o.-year-old obese patient lying in the bed in no acute distress.  EYES: Pupils equal, round, reactive to light and accommodation. No scleral icterus. Extraocular muscles intact.  HEENT: Head atraumatic, normocephalic. Oropharynx and nasopharynx clear.  NECK: Supple, no jugular venous distention. No thyroid enlargement, no tenderness.  LUNGS: Normal breath sounds bilaterally, no wheezing, rales, rhonchi. No use of accessory muscles of respiration.  CARDIOVASCULAR: S1, S2 normal. No murmurs, rubs, or gallops.  ABDOMEN: Soft, nontender, nondistended. Bowel sounds present. No organomegaly or mass.  EXTREMITIES: No cyanosis, clubbing, + 1 edema b/l.  NEUROLOGIC: Cranial nerves II through XII are intact. No focal Motor or sensory deficits b/l. Globally weak  PSYCHIATRIC: The patient is alert and oriented x 3.  SKIN: No obvious rash, lesion, or ulcer.   DATA REVIEW:   CBC  Recent Labs Lab 09/09/15 0559  WBC 12.0*  HGB 11.5*  HCT 35.1  PLT 283    Chemistries   Recent Labs Lab 09/10/15 0549  NA 135  K 4.6  CL 98*  CO2 29  GLUCOSE 292*  BUN 20  CREATININE 1.00  CALCIUM 8.7*    Cardiac Enzymes  Recent Labs Lab 09/08/15 1621  TROPONINI <0.03    Microbiology Results  Results for orders placed or performed during the hospital encounter of 09/08/15  Blood Culture (routine x 2)     Status: None (Preliminary result)   Collection Time: 09/08/15  5:22 PM  Result Value Ref Range Status   Specimen Description BLOOD LEFT ASSIST CONTROL  Final   Special Requests   Final    BOTTLES DRAWN AEROBIC AND ANAEROBIC  AERO [place] ANA [place]   Culture NO GROWTH 2 DAYS  Final   Report Status PENDING  Incomplete  Blood Culture (routine x 2)     Status: None (Preliminary result)   Collection Time: 09/08/15  5:22 PM  Result  Value Ref Range Status   Specimen Description BLOOD LEFT HAND  Final   Special Requests BOTTLES DRAWN AEROBIC AND ANAEROBIC  5CC  Final   Culture NO GROWTH 2 DAYS  Final   Report Status PENDING  Incomplete  Urine culture     Status: None   Collection Time: 09/08/15  7:49 PM  Result Value Ref Range Status   Specimen Description URINE, RANDOM  Final   Special Requests NONE  Final   Culture NO GROWTH Performed at [facility]   Final   Report Status 09/10/2015 FINAL  Final  MRSA PCR Screening     Status: None   Collection Time: 09/09/15  3:35 AM  Result Value Ref Range Status   MRSA by PCR NEGATIVE NEGATIVE Final    Comment:        The GeneXpert MRSA Assay (FDA approved for NASAL specimens only), is one component of a comprehensive MRSA colonization surveillance program. It is not intended to diagnose MRSA infection nor to guide or monitor treatment for MRSA infections.     RADIOLOGY:  Dg Chest 2 View  09/08/2015  CLINICAL DATA:  Dizziness, nausea, shortness of breath and fall. Initial encounter. Slightly decreased O2 sats. EXAM: CHEST  2 VIEW COMPARISON:  01/27/2014. FINDINGS: Trachea is midline. Heart size is accentuated by somewhat low lung volumes. There is an overall slight increase in prominence of the pulmonary markings. No pleural fluid. Soft tissue thickening along the apical and lateral hemithoraces asymmetric can likely get extrapleural fat. IMPRESSION: Mild diffuse prominence of the pulmonary markings may be due to vascular crowding related to low lung volumes. An atypical or viral infectious process cannot be excluded. Electronically Signed   By: Lorin Picket M.D.   On: 09/08/2015 18:02      Management plans discussed with the patient, family and they are in agreement.  CODE STATUS:     Code Status Orders        Start     Ordered   09/08/15 2338  Full code   Continuous     09/08/15 2337    Code Status History    Date Active Date Inactive Code  Status Order ID Comments User Context   This patient has a current code status but no historical code status.      TOTAL TIME TAKING CARE OF THIS PATIENT: 40 minutes.    Henreitta Leber M.D on 09/10/2015 at 3:19 PM  Between 7am to 6pm - Pager - [phone]  After 6pm go to [email or web address] - password [email or web address] [place] [facility]  Office  [phone]  CC: Primary care physician; [name], MD

## 2015-09-10 NOTE — Progress Notes (Addendum)
Inpatient Diabetes Program Recommendations  AACE/ADA: New Consensus Statement on Inpatient Glycemic Control (2015)  Target Ranges:  Prepandial:   less than 140 mg/dL      Peak postprandial:   less than 180 mg/dL (1-2 hours)      Critically ill patients:  140 - 180 mg/dL   Lab Results  Component Value Date   GLUCAP 370* 09/09/2015   HGBA1C 10.0* 09/18/2012    Review of Glycemic Control  Results for KAIZLEY, DEJONGH (MRN [id number]) as of 09/10/2015 08:21  Ref. Range 09/09/2015 08:25 09/09/2015 12:41 09/09/2015 17:09 09/09/2015 20:46  Glucose-Capillary Latest Ref Range: 65-99 mg/dL 293 (H) 390 (H) 350 (H) 370 (H)     Diabetes history: Type 2 diabetes Outpatient Diabetes medications: Humulin N U500 120 units bid, Tradgenta 5mg /day Current orders for Inpatient glycemic control: Lantus 20 units bid, Novolog 0-20 units tid, Novolog 0-5 units qhs, Humulin U500 60 units bid, Tradgenta 5mg /day* prednisone 50mg  with breakfast  Inpatient Diabetes Program Recommendations:     Patient takes 120 units of U500 2x/day at home. CBG very elevated.  Please consider increasing Humulin R U500 to 100 units  bid.     Please d/c Lantus insulin.    Gentry Fitz, RN, BA, MHA, CDE Diabetes Coordinator Inpatient Diabetes Program  [phone] (Team Pager) [phone] ([place]) 09/10/2015 8:15 AM

## 2015-09-10 NOTE — Care Management (Signed)
Need O2 assessment for see if patient qualifies for outpatient O2.

## 2015-09-10 NOTE — Care Management Obs Status (Signed)
[place] NOTIFICATION   Patient Details  Name: [name] [name] MRN: [id number] Date of Birth: 1956-01-01   Medicare Observation Status Notification Given:  Yes  Code [address], RN 09/10/2015, 1:49 PM

## 2015-09-10 NOTE — Care Management (Addendum)
Spoke [name] [name] at [facility] [phone] per patient request for agency. They are only for PCS (private pay) services. List of private pay sitters left with patient. Patient would like to use [facility] for home health. I have notified jason with [facility] of patient discharge. Bariatric rollator and O2 also requested from [facility] to be delivered to this room. MD notified of need for Rx for both.  No further RNCM needs.

## 2015-09-10 NOTE — Progress Notes (Signed)
Pt given dose of insulin this

## 2015-09-10 NOTE — Progress Notes (Signed)
Pt discharged to home with family. [facility] provided oxygen for transport. Reviewed d/c instructions and prescriptions with pt.

## 2015-09-11 NOTE — Clinical Social Work Note (Signed)
There was a message that CSW had from Dava, charge nurse on 1A, last evening regarding patient's discharge home on oxygen and the patient called back to the hospital saying the oxygen company stated that they could not deliver it until Monday. CSW has relayed this to the covering RN CM today to follow up. Shela Leff MSW,LCSW [phone]

## 2015-09-13 LAB — CULTURE, BLOOD (ROUTINE X 2)
CULTURE: NO GROWTH
Culture: NO GROWTH

## 2015-10-05 ENCOUNTER — Other Ambulatory Visit: Payer: Self-pay | Admitting: General Surgery

## 2015-10-06 NOTE — Progress Notes (Signed)
   09/10/15 1243  Other Exercises  Other Exercises Assisted patient with BRW to bathroom commode.   PT Time Calculation  PT Start Time (ACUTE ONLY) 0934  PT Stop Time (ACUTE ONLY) 1003  PT Time Calculation (min) (ACUTE ONLY) 29 min  PT G-Codes **NOT FOR INPATIENT CLASS**  Functional Assessment Tool Used (Clinical judgement)  Functional Limitation Mobility: Walking and moving around  Mobility: Walking and Moving Around Current Status [id number] CI  Mobility: Walking and Moving Around Goal Status [id number]) CI  PT General Charges  $$ ACUTE PT VISIT 1 Procedure  PT Evaluation  $PT Eval Moderate Complexity 1 Procedure   Late entry G-codes Kerman Passey, PT, DPT

## 2015-10-07 ENCOUNTER — Other Ambulatory Visit: Payer: Self-pay | Admitting: General Surgery

## 2015-10-07 MED ORDER — CLINDAMYCIN PHOSPHATE 1 % EX SOLN: Freq: Two times a day (BID) | CUTANEOUS | Status: DC

## 2015-10-07 NOTE — Telephone Encounter (Signed)
Medication is working well to control patient's Hydradenitis. Refill has been sent to preferred pharmacy at this time.

## 2015-10-07 NOTE — Telephone Encounter (Signed)
Would a refill of clindamycin (CLEOCIN-T) 1 % external solution (roll on)?

## 2015-10-28 ENCOUNTER — Ambulatory Visit: Payer: Commercial Managed Care - HMO | Admitting: General Surgery

## 2015-10-29 ENCOUNTER — Ambulatory Visit: Payer: Commercial Managed Care - HMO | Admitting: General Surgery

## 2015-10-29 DIAGNOSIS — R0789 Other chest pain: Secondary | ICD-10-CM | POA: Insufficient documentation

## 2015-11-11 ENCOUNTER — Ambulatory Visit: Payer: Commercial Managed Care - HMO | Admitting: General Surgery

## 2015-11-18 ENCOUNTER — Telehealth: Payer: Self-pay | Admitting: General Surgery

## 2015-11-18 NOTE — Telephone Encounter (Signed)
This is an established patient of Dr Adonis Huguenin, seen for Hidradenitis. Patient states she has an area on the back of her neck that has started to bleed again. She states she is already on an antibiotic, from her PCP, for respiratory issues, but finishes that medicine today. She is concerned about this area and would like to speak with Amber. Does she need an appointment to come in and see Dr Dahlia Byes tomorrow? Dr Adonis Huguenin isn't in the office again until September 13th.

## 2015-11-23 MED ORDER — DOXYCYCLINE HYCLATE 100 MG PO CAPS: 100.0000 mg | ORAL_CAPSULE | Freq: Two times a day (BID) | ORAL | 0 refills | Status: DC

## 2015-11-23 NOTE — Telephone Encounter (Signed)
Patient returned phone call and explained that her Biliateral Axilla and her Neck is inflammed and draining currently. She denies having an area that needs to be opened at this time. She also explains that her [facility] has suggested that she have surgery.  Patient has an appointment with Dr. [name] to speak about Pulmonary Clearance on 12/07/15 at 2pm.  Spoke with Dr. Adonis Huguenin. He has ordered Doxycycline and to see patient next week.   Medication sent to preferred pharmacy and patient placed on schedule for 12/01/15.

## 2015-11-23 NOTE — Telephone Encounter (Signed)
Called patient once again at this time. Left voicemail for return phone call.

## 2015-11-29 ENCOUNTER — Telehealth: Payer: Self-pay | Admitting: General Surgery

## 2015-11-29 NOTE — Telephone Encounter (Signed)
Returned phone call to patient at this time. Patient stopped taking her doxycycline on Sunday due to diarrhea. I explained patient that being on the antibiotic is the only thing that will help this in the interm. Patient is to see surgeon on Wednesday. Patient will see Dr. Adonis Huguenin and plan of care will be discussed at that time.  Patient encouraged to restart antibiotic but refused to do so.

## 2015-11-29 NOTE — Telephone Encounter (Signed)
Patient would like to talk to [facility] regarding an antibiotic

## 2015-12-01 ENCOUNTER — Telehealth: Payer: Self-pay

## 2015-12-01 ENCOUNTER — Ambulatory Visit (INDEPENDENT_AMBULATORY_CARE_PROVIDER_SITE_OTHER): Payer: Medicare Other | Admitting: General Surgery

## 2015-12-01 ENCOUNTER — Encounter: Payer: Self-pay | Admitting: General Surgery

## 2015-12-01 VITALS — BP 93/46 | HR 92 | Temp 97.3°F

## 2015-12-01 DIAGNOSIS — L732 Hidradenitis suppurativa: Secondary | ICD-10-CM

## 2015-12-01 MED ORDER — CLINDAMYCIN PHOSPHATE 1 % EX SOLN: Freq: Two times a day (BID) | CUTANEOUS | 2 refills | Status: DC

## 2015-12-01 MED ORDER — DOXYCYCLINE HYCLATE 50 MG PO CAPS: 50.0000 mg | ORAL_CAPSULE | Freq: Two times a day (BID) | ORAL | 0 refills | Status: DC

## 2015-12-01 NOTE — Telephone Encounter (Signed)
I apologize. Patient will see Dr. [name] on 12/07/2015.  Erby Pian MD (Attending) tel:[phone] (Work) fax:[phone] [address] [place], [place] [address]

## 2015-12-01 NOTE — Telephone Encounter (Signed)
Patient will be seen by Dr. Jamal Collin (Pulmonologist) next week. Can you please send a pulmonary clearance. Patient will return to see Dr. Adonis Huguenin on 12/13/2015 so we could schedule surgery. Hopefully we get clearance before her appointment with Dr. Adonis Huguenin. Thank you.

## 2015-12-01 NOTE — Patient Instructions (Signed)
Please go to your appointment to see your lung doctor so we could get clearance. Then we will see you in two weeks so we could schedule your surgery.  We will reorder your Clindamycin external solution for you to apply on your affected area.

## 2015-12-02 NOTE — Progress Notes (Signed)
Outpatient Surgical Follow Up  12/02/2015  [name] [name] is an 60 y.o. female.   Chief Complaint  Patient presents with  . Follow-up    Hydradenitis- Bilateral Axilla and Neck    HPI: 60 year old female is well-known to the surgery department returns to clinic for evaluation of worsening hidradenitis. Patient has been seen numerous times for this identical complaint. States there is recurrent purulent drainage from her left breast and her right axilla. The area to her posterior neck and her left axilla her currently without active drainage. She continues to have all of her chronic medical problems including self discontinuing the antibiotics are ordered for her last week without telling us until today. She continues to have poorly controlled diabetes and require steroids for her lung disease. She denies any current fevers, chills, chest pain, shortness of breath, diarrhea, constipation.  Past Medical History:  Diagnosis Date  . Allergic rhinitis   . Allergy   . COPD (chronic obstructive pulmonary disease) ([place])   . Degenerative joint disease of knee, left   . Depression   . Diabetes mellitus without complication ([place])   . Edema   . GERD (gastroesophageal reflux disease)   . Hidradenitis   . History of colonic polyps   . Hypertension   . IBS (irritable bowel syndrome)   . Insomnia   . Obesity   . Sinusitis, chronic   . Sleep apnea   . Thyroid disease   . Vaginitis, atrophic   . Vertigo   . Vitamin D deficiency     Past Surgical History:  Procedure Laterality Date  . ABDOMINAL HYSTERECTOMY    . CESAREAN SECTION     x 2  . CHOLECYSTECTOMY    . TONSILLECTOMY      Family History  Problem Relation Age of Onset  . Rashes / Skin problems Father   . Hypertension Father   . Breast cancer Paternal Grandmother     Social History:  reports that she has quit smoking. She has never used smokeless tobacco. She reports that she does not drink alcohol or use drugs.  Allergies:   Allergies  Allergen Reactions  . Codeine Itching    Medications reviewed.    ROS A multipoint review of systems was completed. All pertinent positives and negatives are documented in the history of present illness remainder negative.   BP (!) 93/46   Pulse 92   Temp 97.3 F (36.3 C) (Oral)   Physical Exam Gen.: No acute distress Neck: Supple and nontender, large, area of scarring from hidradenitis to the posterior aspect. Chest: Clear to auscultation with equal motion Heart: Regular rhythm Abdomen: Large, soft, nontender Skin: Multiple areas of hidradenitis including the posterior neck on the left side, bilateral axilla, lateral aspect of the left breast. Expressible purulence to the left breast and the right axilla. No spreading erythema.    No results found for this or any previous visit (from the past 48 hour(s)). No results found.  Assessment/Plan:  1. Hidradenitis 60 year old female with multiple medical problems and chronic recurrent infected hidradenitis. Discussed again the importance of antibiotic therapy. We'll provide her with a different prescription for antibiotics that she thinks she can tolerate. She is to be seen by her pulmonary doctor next week and she is instructed to obtain operative clearance so that we can take her to the operating room to remove some of the areas of hidradenitis. Discussed that she is a set up for wound complications but she voiced understanding and desires  to proceed. She'll follow up in 2 weeks.     Clayburn Pert, MD FACS General Surgeon  12/02/2015,8:32 AM

## 2015-12-06 ENCOUNTER — Telehealth: Payer: Self-pay | Admitting: General Surgery

## 2015-12-06 NOTE — Telephone Encounter (Signed)
Left breast has shooting pain. Is this normal. She has congestion. She is taking the antibiotics that Dr. Adonis Huguenin called in. Should she take something else for congestion? No surgery yet

## 2015-12-06 NOTE — Telephone Encounter (Signed)
Spoke with patient at this time whom explained that her Breast is still draining but it more painful than when she was seen by Dr. Adonis Huguenin and started her antibiotics last week.  States that she feels like her breast is filling with pus and needs to be drained.  Patient placed on schedule to see Dr. Azalee Course tomorrow.

## 2015-12-07 ENCOUNTER — Ambulatory Visit (INDEPENDENT_AMBULATORY_CARE_PROVIDER_SITE_OTHER): Payer: Medicare Other | Admitting: Surgery

## 2015-12-07 ENCOUNTER — Encounter: Payer: Self-pay | Admitting: Surgery

## 2015-12-07 VITALS — BP 126/62 | HR 101 | Temp 98.2°F | Ht 63.0 in | Wt 282.0 lb

## 2015-12-07 DIAGNOSIS — L732 Hidradenitis suppurativa: Secondary | ICD-10-CM | POA: Diagnosis not present

## 2015-12-07 MED ORDER — CLINDAMYCIN PHOSPHATE 1 % EX GEL
Freq: Two times a day (BID) | CUTANEOUS | 0 refills | Status: DC
Start: 2015-12-07 — End: 2016-03-27

## 2015-12-07 MED ORDER — DOXYCYCLINE MONOHYDRATE 100 MG PO CAPS: 100.0000 mg | ORAL_CAPSULE | Freq: Two times a day (BID) | ORAL | 0 refills | Status: DC

## 2015-12-07 MED ORDER — DOXYCYCLINE HYCLATE 100 MG PO TABS: 100.0000 mg | ORAL_TABLET | Freq: Two times a day (BID) | ORAL | Status: DC

## 2015-12-07 NOTE — Patient Instructions (Signed)
Please keep your follow up appointment with Dr.Woodham on 12/13/15. I have sent your medicines to your pharmacy. Please call our office if you have any questions or concerns.

## 2015-12-07 NOTE — Progress Notes (Signed)
60 year old female with known history of hidradenitis now with hidradenitis of left breast. Patient states that she's had a throbbing pain in this area since she saw Dr. Sanda Klein. Patient states she also had some bloody purulent drainage in the area off and on for the past 3 weeks. Patient states that the pain feels about the same and that she's not had any fever or chills. Patient states that there is not been any redness or spreading of the redness in the area. Patient denies any fever chills nausea vomiting or any other symptoms.  Past Medical History:  Diagnosis Date  . Allergic rhinitis   . Allergy   . COPD (chronic obstructive pulmonary disease) ([place])   . Degenerative joint disease of knee, left   . Depression   . Diabetes mellitus without complication ([place])   . Edema   . GERD (gastroesophageal reflux disease)   . Hidradenitis   . History of colonic polyps   . Hypertension   . IBS (irritable bowel syndrome)   . Insomnia   . Obesity   . Sinusitis, chronic   . Sleep apnea   . Thyroid disease   . Vaginitis, atrophic   . Vertigo   . Vitamin D deficiency    Past Surgical History:  Procedure Laterality Date  . ABDOMINAL HYSTERECTOMY    . CESAREAN SECTION     x 2  . CHOLECYSTECTOMY    . TONSILLECTOMY     Family History  Problem Relation Age of Onset  . Rashes / Skin problems Father   . Hypertension Father   . Breast cancer Paternal Grandmother    Social History   Social History  . Marital status: Divorced    Spouse name: N/A  . Number of children: N/A  . Years of education: N/A   Social History Main Topics  . Smoking status: Former Research scientist (life sciences)  . Smokeless tobacco: Never Used  . Alcohol use No  . Drug use: No  . Sexual activity: Not Asked   Other Topics Concern  . None   Social History Narrative  . None    Current Outpatient Prescriptions:  .  albuterol (PROVENTIL HFA;VENTOLIN HFA) 108 (90 BASE) MCG/ACT inhaler, Inhale 2 puffs into the lungs every 4 (four) hours  as needed for wheezing or shortness of breath., Disp: , Rfl:  .  albuterol (PROVENTIL) (2.5 MG/3ML) 0.083% nebulizer solution, Take 2.5 mg by nebulization every 6 (six) hours as needed for wheezing or shortness of breath. , Disp: , Rfl:  .  aspirin EC 81 MG tablet, Take 81 mg by mouth at bedtime., Disp: , Rfl:  .  atorvastatin (LIPITOR) 20 MG tablet, Take 20 mg by mouth at bedtime. , Disp: , Rfl:  .  budesonide-formoterol (SYMBICORT) 160-4.5 MCG/ACT inhaler, Inhale 2 puffs into the lungs 2 (two) times daily., Disp: , Rfl:  .  doxepin (SINEQUAN) 50 MG capsule, Take 50 mg by mouth at bedtime., Disp: , Rfl:  .  doxycycline (VIBRAMYCIN) 50 MG capsule, Take 1 capsule (50 mg total) by mouth 2 (two) times daily., Disp: 28 capsule, Rfl: 0 .  DULoxetine (CYMBALTA) 20 MG capsule, Take 20 mg by mouth daily., Disp: , Rfl:  .  furosemide (LASIX) 40 MG tablet, Take 40 mg by mouth 2 (two) times daily., Disp: , Rfl:  .  gabapentin (NEURONTIN) 100 MG capsule, Take 200 mg by mouth at bedtime. , Disp: , Rfl:  .  HUMULIN R U-500 KWIKPEN 500 UNIT/ML injection, Inject 120 Units into the  skin 2 (two) times daily with a meal. , Disp: , Rfl: 1 .  ketoconazole (NIZORAL) 2 % shampoo, Apply 1 application topically 2 (two) times a week., Disp: , Rfl:  .  levothyroxine (SYNTHROID, LEVOTHROID) 200 MCG tablet, Take 200 mcg by mouth daily before breakfast. , Disp: , Rfl: 1 .  lisinopril-hydrochlorothiazide (PRINZIDE,ZESTORETIC) 10-12.5 MG per tablet, Take 1 tablet by mouth daily., Disp: , Rfl:  .  loratadine (CLARITIN) 10 MG tablet, Take 10 mg by mouth daily., Disp: , Rfl:  .  meclizine (ANTIVERT) 25 MG tablet, Take 25 mg by mouth 3 (three) times daily as needed for dizziness. , Disp: , Rfl:  .  mometasone (NASONEX) 50 MCG/ACT nasal spray, Place 2 sprays into both nostrils daily. , Disp: , Rfl:  .  montelukast (SINGULAIR) 10 MG tablet, Take 10 mg by mouth at bedtime. , Disp: , Rfl: 1 .  olopatadine (PATANOL) 0.1 % ophthalmic  solution, Place 1 drop into both eyes 2 (two) times daily., Disp: , Rfl:  .  ondansetron (ZOFRAN) 8 MG tablet, Take 4-8 mg by mouth every 8 (eight) hours as needed for nausea or vomiting., Disp: , Rfl:  .  pantoprazole (PROTONIX) 40 MG tablet, Take 40 mg by mouth daily., Disp: , Rfl:  .  potassium chloride (K-DUR) 10 MEQ tablet, Take 10 mEq by mouth daily. , Disp: , Rfl: 1 .  TRADJENTA 5 MG TABS tablet, Take 5 mg by mouth daily. , Disp: , Rfl: 1 .  traZODone (DESYREL) 50 MG tablet, Take 50-100 mg by mouth at bedtime as needed for sleep. , Disp: , Rfl:  .  clindamycin (CLEOCIN T) 1 % external solution, apply to affected area twice a day, Disp: , Rfl: 0 .  clindamycin (CLINDAGEL) 1 % gel, Apply topically 2 (two) times daily., Disp: 30 g, Rfl: 0 .  doxycycline (MONODOX) 100 MG capsule, Take 1 capsule (100 mg total) by mouth 2 (two) times daily., Disp: 28 capsule, Rfl: 0  Current Facility-Administered Medications:  .  doxycycline (VIBRA-TABS) tablet 100 mg, 100 mg, Oral, Q12H, Hubbard Robinson, MD Allergies  Allergen Reactions  . Codeine Itching    Review of Systems - Negative except as above in HPI   Vitals:   12/07/15 1254  BP: 126/62  Pulse: (!) 101  Temp: 98.2 F (36.8 C)   PE:  Gen: NAD Res: CTAB/L  Cardio: RRR Breast: Left breast with draining chronic wound with hyperemic skin surrounding in a <1cm area but without any erythema or fluctance, drainage is purulent  Axilla: bilateral axilla with mulitple previous scars, no erythema or inflamation  Abd: soft, nt, nd  Ext: 1+ edema, non tender  CBC Latest Ref Rng & Units 09/09/2015 09/08/2015 09/15/2014  WBC 3.6 - 11.0 K/uL 12.0(H) 14.7(H) 13.0(H)  Hemoglobin 12.0 - 16.0 g/dL 11.5(L) 12.9 13.1  Hematocrit 35.0 - 47.0 % 35.1 37.0 41.2  Platelets 150 - 440 K/uL 283 291 300   CMP Latest Ref Rng & Units 09/10/2015 09/09/2015 09/08/2015  Glucose 65 - 99 mg/dL 292(H) 342(H) 119(H)  BUN 6 - 20 mg/dL 20 38(H) 50(H)  Creatinine 0.44 -  1.00 mg/dL 1.00 1.68(H) 2.06(H)  Sodium 135 - 145 mmol/L 135 136 133(L)  Potassium 3.5 - 5.1 mmol/L 4.6 4.6 4.5  Chloride 101 - 111 mmol/L 98(L) 102 94(L)  CO2 22 - 32 mmol/L 29 28 28   Calcium 8.9 - 10.3 mg/dL 8.7(L) 7.9(L) 8.9  Total Protein g/dL - - -  Total Bilirubin  mg/dL - - -  Alkaline Phos U/L - - -  AST U/L - - -  ALT U/L - - -    A/P:  60 year old with a left breast hidradenitis that is draining. I did discuss with patient to start using the clindamycin that have been ordered as ointment to the area. The patient prefers a certain type of ointment and will try and get that for her at her former [facility]. I also discussed with her that even though the doxycycline does cause her some diarrhea would really recommend 100 mg dose. The patient is willing to go back to this as well. I do not feel this area is worsening it seems to be improving from her last seen Dr. Sanda Klein. I instructed her to keep her appointment with Dr. Jamal Collin of pulmonology and she will follow-up with Dr. Sanda Klein next week on Monday the 25th.

## 2015-12-13 ENCOUNTER — Ambulatory Visit: Payer: Self-pay | Admitting: General Surgery

## 2015-12-14 ENCOUNTER — Ambulatory Visit: Payer: Self-pay | Admitting: General Surgery

## 2015-12-14 ENCOUNTER — Other Ambulatory Visit: Payer: Self-pay

## 2015-12-14 DIAGNOSIS — I272 Pulmonary hypertension, unspecified: Secondary | ICD-10-CM | POA: Insufficient documentation

## 2015-12-15 ENCOUNTER — Encounter: Payer: Self-pay | Admitting: General Surgery

## 2015-12-15 ENCOUNTER — Ambulatory Visit (INDEPENDENT_AMBULATORY_CARE_PROVIDER_SITE_OTHER): Payer: Medicare Other | Admitting: General Surgery

## 2015-12-15 VITALS — BP 111/52 | HR 102 | Temp 98.3°F | Ht 63.0 in | Wt 280.0 lb

## 2015-12-15 DIAGNOSIS — L732 Hidradenitis suppurativa: Secondary | ICD-10-CM | POA: Diagnosis not present

## 2015-12-15 NOTE — Patient Instructions (Signed)
We have scheduled your surgery for 12/31/15 with Dr.Woodham at [facility]. Please see [facility] pre-care sheet for surgery information. Please call our office if you have any questions or concerns.

## 2015-12-15 NOTE — Progress Notes (Signed)
Outpatient Surgical Follow Up  12/15/2015  [name] [name] is an 60 y.o. female.   Chief Complaint  Patient presents with  . Follow-up    Hydradenitis    HPI: 60 year old female returns to clinic for follow-up for her hidradenitis. She reports that since her last visit the majority of the areas had actually improved. This is because she actually started taking the antibiotics as prescribed. She reports that her left axilla and posterior neck areas have healed up well and that the left breast and right axillary areas have improved but continued to have intermittent drainage. She denies any fevers, chills, nausea, vomiting, chest pain, diarrhea, constipation. She does have her chronic shortness of breath for which she is followed by pulmonary for. She is extremely interested in surgical excision at this time.  Past Medical History:  Diagnosis Date  . Allergic rhinitis   . Allergy   . COPD (chronic obstructive pulmonary disease) ([place])   . Degenerative joint disease of knee, left   . Depression   . Diabetes mellitus without complication ([place])   . Edema   . GERD (gastroesophageal reflux disease)   . Hidradenitis   . History of colonic polyps   . Hypertension   . IBS (irritable bowel syndrome)   . Insomnia   . Obesity   . Sinusitis, chronic   . Sleep apnea   . Thyroid disease   . Vaginitis, atrophic   . Vertigo   . Vitamin D deficiency     Past Surgical History:  Procedure Laterality Date  . ABDOMINAL HYSTERECTOMY    . CESAREAN SECTION     x 2  . CHOLECYSTECTOMY    . TONSILLECTOMY      Family History  Problem Relation Age of Onset  . Rashes / Skin problems Father   . Hypertension Father   . Breast cancer Paternal Grandmother     Social History:  reports that she has quit smoking. She has never used smokeless tobacco. She reports that she does not drink alcohol or use drugs.  Allergies:  Allergies  Allergen Reactions  . Codeine Itching    Medications  reviewed.    ROS A multipoint review of systems was obtained, all pertinent positives and negatives are documented within the history of present illness and remainder are negative.   BP (!) 111/52   Pulse (!) 102   Temp 98.3 F (36.8 C) (Oral)   Ht 5\' 3"  (1.6 m)   Wt 127 kg (280 lb)   BMI 49.60 kg/m   Physical Exam Gen.: No acute distress neck: Left posterior neck with visible scarring from hidradenitis. There is no active drainage or evidence of infection. Lymph nodes: Cervical and clavicular lymph node beds without active lymphadenopathy Chest: Diffuse wheezing throughout but equal wall motion on inspiration and expiration. Breast: Left breast with a 4 x 1 cm area of persistent induration and pustules consistent with hidradenitis at the site of previous abscess drainage. Abdomen: Large, soft, nontender Skin: Bilateral axilla with evidence hidradenitis. On her left side is almost completely resolved to scar tissue with a small subcentimeter area of persistent active hidradenitis. On her right side there is a 3 x 2 cm area of discoloration expressible drainage consistent with active hidradenitis. Extremities: Palpable edema in all 4 extremities.    No results found for this or any previous visit (from the past 48 hour(s)). No results found.  Assessment/Plan:  1. Hidradenitis 60 year old female with persistent, chronic hidradenitis with some persistent purulent drainage  to the left breast and right axilla. Her other sites are healing well on the conservative therapy. All areas actually improved. Discussed again the surgical option of local excision to the active areas in detail. Patient voiced understanding and desired to proceed. We'll tentatively plan for operative intervention on Friday, October 13. She will proceed to preoperative testing and have a preoperative anesthesia visit.     Clayburn Pert, MD FACS General Surgeon  12/15/2015,1:43 PM

## 2015-12-17 ENCOUNTER — Telehealth: Payer: Self-pay | Admitting: General Surgery

## 2015-12-17 NOTE — Telephone Encounter (Signed)
Pt advised of pre op date/time and sx date. Sx: 12/31/15 with Dr Sherlon Handing Axillary and left breast excision of hidradenitis.  Pre op: 12/24/15 @ 8:15am.  Patient made aware to call [phone], between 1-3:00pm the day before surgery, to find out what time to arrive.

## 2015-12-19 HISTORY — PX: BREAST EXCISIONAL BIOPSY: SUR124

## 2015-12-23 ENCOUNTER — Encounter
Admission: RE | Admit: 2015-12-23 | Discharge: 2015-12-23 | Disposition: A | Discharge status: Home / Self Care | Payer: Medicare Other | Source: Ambulatory Visit | Attending: General Surgery | Admitting: General Surgery

## 2015-12-23 ENCOUNTER — Telehealth: Payer: Self-pay | Admitting: General Surgery

## 2015-12-23 DIAGNOSIS — Z01818 Encounter for other preprocedural examination: Secondary | ICD-10-CM | POA: Insufficient documentation

## 2015-12-23 HISTORY — DX: Pneumonia, unspecified organism: J18.9

## 2015-12-23 HISTORY — DX: Hyperlipidemia, unspecified: E78.5

## 2015-12-23 HISTORY — DX: Dorsalgia, unspecified: M54.9

## 2015-12-23 HISTORY — DX: Peripheral vascular disease, unspecified: I73.9

## 2015-12-23 HISTORY — DX: Dependence on supplemental oxygen: Z99.81

## 2015-12-23 HISTORY — DX: Hypothyroidism, unspecified: E03.9

## 2015-12-23 LAB — BASIC METABOLIC PANEL
Anion gap: 11 (ref 5–15)
BUN: 30 mg/dL — ABNORMAL HIGH (ref 6–20)
CALCIUM: 9.3 mg/dL (ref 8.9–10.3)
CO2: 29 mmol/L (ref 22–32)
CREATININE: 0.97 mg/dL (ref 0.44–1.00)
Chloride: 97 mmol/L — ABNORMAL LOW (ref 101–111)
GFR calc Af Amer: >60 mL/min (ref >60)
GFR calc non Af Amer: >60 mL/min (ref >60)
GLUCOSE: 135 mg/dL — AB (ref 65–99)
Potassium: 3.8 mmol/L (ref 3.5–5.1)
Sodium: 137 mmol/L (ref 135–145)

## 2015-12-23 LAB — CBC
HEMATOCRIT: 38.7 % (ref 35.0–47.0)
Hemoglobin: 12.8 g/dL (ref 12.0–16.0)
MCH: 26.9 pg (ref 26.0–34.0)
MCHC: 33 g/dL (ref 32.0–36.0)
MCV: 81.6 fL (ref 80.0–100.0)
Platelets: 289 10*3/uL (ref 150–440)
RBC: 4.74 MIL/uL (ref 3.80–5.20)
RDW: 15.2 % — AB (ref 11.5–14.5)
WBC: 20.7 10*3/uL — ABNORMAL HIGH (ref 3.6–11.0)

## 2015-12-23 MED ORDER — CHLORHEXIDINE GLUCONATE CLOTH 2 % EX PADS
6.0000 | MEDICATED_PAD | Freq: Once | CUTANEOUS | Status: DC
  Filled 2015-12-23: qty 6

## 2015-12-23 NOTE — Pre-Procedure Instructions (Addendum)
Called Dr Ronelle Nigh regarding anesthesia consult. "Get cardiac clearance." per Dr Ronelle Nigh.  Appointment made with Dr [name] for the 10th @ 09:00.  Dr Reginal Lutes office notified regarding above mentioned.

## 2015-12-23 NOTE — Telephone Encounter (Signed)
FYI, Pre-Admit called to advise that patient requires a cardiac clearance. They have scheduled her an appointment with Cardiologist, Dr [name] on Tuesday 10/10 at [facility].

## 2015-12-23 NOTE — Patient Instructions (Signed)
  Your procedure is scheduled on: 12/31/15 Fri Report to Same Day Surgery 2nd floor medical mall To find out your arrival time please call [phone] between 1PM - 3PM on 12/30/15 Thur Remember: Instructions that are not followed completely may result in serious medical risk, up to and including death, or upon the discretion of your surgeon and anesthesiologist your surgery may need to be rescheduled.    _x___ 1. Do not eat food or drink liquids after midnight. No gum chewing or hard candies.     __x__ 2. No Alcohol for 24 hours before or after surgery.   __x__3. No Smoking for 24 prior to surgery.   ____  4. Bring all medications with you on the day of surgery if instructed.    __x__ 5. Notify your doctor if there is any change in your medical condition     (cold, fever, infections).     Do not wear jewelry, make-up, hairpins, clips or nail polish.  Do not wear lotions, powders, or perfumes. You may wear deodorant.  Do not shave 48 hours prior to surgery. Men may shave face and neck.  Do not bring valuables to the hospital.    [facility] is not responsible for any belongings or valuables.               Contacts, dentures or bridgework may not be worn into surgery.  Leave your suitcase in the car. After surgery it may be brought to your room.  For patients admitted to the hospital, discharge time is determined by your treatment team.   Patients discharged the day of surgery will not be allowed to drive home.    Please read over the following fact sheets that you were given:   [facility] Preparing for Surgery and or MRSA Information   _x___ Take these medicines the morning of surgery with A SIP OF WATER:    1. albuterol (PROVENTIL HFA;VENTOLIN HFA) 108 (90 BASE) MCG/ACT inhaler  2.budesonide-formoterol (SYMBICORT) 160-4.5 MCG/ACT inhaler  3.DULoxetine (CYMBALTA) 20 MG capsule  4.Fluticasone-Salmeterol (ADVAIR) 250-50 MCG/DOSE AEPB  5.levothyroxine (SYNTHROID, LEVOTHROID) 200  MCG tablet  6.pantoprazole (PROTONIX) 40 MG tablet  ____Fleets enema or Magnesium Citrate as directed.   _x___ Use CHG Soap or sage wipes as directed on instruction sheet   ____ Use inhalers on the day of surgery and bring to hospital day of surgery  ____ Stop metformin 2 days prior to surgery    __x__ Take 1/2 of usual insulin dose the night before surgery and none on the morning of           surgery.   _x___ Stop aspirin or coumadin, or plavix Stop aspirin today  x__ Stop Anti-inflammatories such as Advil, Aleve, Ibuprofen, Motrin, Naproxen,          Naprosyn, Goodies powders or aspirin products. Ok to take Tylenol. Stop Lodine today   ____ Stop supplements until after surgery.    ____ Bring C-Pap to the hospital.

## 2015-12-23 NOTE — Telephone Encounter (Signed)
Pre-Admit called to advise that patient has a White Blood Cell count of 20.7.

## 2015-12-24 ENCOUNTER — Other Ambulatory Visit: Payer: Medicare Other

## 2015-12-24 MED ORDER — DOXYCYCLINE HYCLATE 100 MG PO CAPS: 100.0000 mg | ORAL_CAPSULE | Freq: Two times a day (BID) | ORAL | 0 refills | Status: DC

## 2015-12-24 NOTE — Telephone Encounter (Signed)
Pre-admit has also notified the office that Cardiac Clearance will be needed prior to surgery. Dr. Bethanne Ginger office has been notified by pre-admit and patient has upcoming appointment on 12/28/15. Awaiting clearance at this time.

## 2015-12-24 NOTE — Telephone Encounter (Signed)
Spoke with patient, she states that the only new concern that may possibly contribute to elevated WBC is that she has 2 new areas of inflammation under her right axilla. She has finished her Doxycycline but is still using the Clindagel given.   Spoke with Dr. Azalee Course at this time in regards to elevated WBC count and new areas. She will continue Doxycycline 100mg  BID until surgery and continue using her Clindagel. Medication sent to pharmacy.

## 2015-12-27 NOTE — Telephone Encounter (Signed)
Cardiac Clearance faxed to Dr.Fath at this time. Patient has an appointment on 12/28/15 with Dr.Fath.

## 2015-12-28 ENCOUNTER — Other Ambulatory Visit: Payer: Self-pay | Admitting: Cardiology

## 2015-12-28 DIAGNOSIS — R0789 Other chest pain: Secondary | ICD-10-CM

## 2015-12-29 ENCOUNTER — Encounter
Admission: RE | Admit: 2015-12-29 | Discharge: 2015-12-29 | Disposition: A | Discharge status: Home / Self Care | Payer: Medicare Other | Source: Ambulatory Visit | Attending: Cardiology | Admitting: Cardiology

## 2015-12-29 DIAGNOSIS — R0789 Other chest pain: Secondary | ICD-10-CM | POA: Diagnosis not present

## 2015-12-29 LAB — NM MYOCAR MULTI W/SPECT W/WALL MOTION / EF
CHL CUP NUCLEAR SSS: 1
LVDIAVOL: 77 mL (ref 46–106)
LVSYSVOL: 43 mL
NUC STRESS TID: 0.93
SDS: 0
SRS: 1

## 2015-12-29 MED ORDER — REGADENOSON 0.4 MG/5ML IV SOLN
0.4000 mg | Freq: Once | INTRAVENOUS | Status: AC
  Administered 2015-12-29: 0.4 mg via INTRAVENOUS

## 2015-12-29 MED ORDER — TECHNETIUM TC 99M TETROFOSMIN IV KIT
12.7280 | PACK | Freq: Once | INTRAVENOUS | Status: AC | PRN
  Administered 2015-12-29: 12.728 via INTRAVENOUS

## 2015-12-29 MED ORDER — TECHNETIUM TC 99M TETROFOSMIN IV KIT
30.0000 | PACK | Freq: Once | INTRAVENOUS | Status: AC | PRN
  Administered 2015-12-29: 27.98 via INTRAVENOUS

## 2015-12-29 NOTE — Pre-Procedure Instructions (Signed)
[place] RISK 12/29/15

## 2015-12-30 NOTE — Telephone Encounter (Signed)
Cardiac Clearance obtained at this time from Dr.Fath and will be scanned under Media.

## 2015-12-31 ENCOUNTER — Ambulatory Visit: Payer: Medicare Other | Admitting: Anesthesiology

## 2015-12-31 ENCOUNTER — Encounter
Admission: RE | Disposition: A | Discharge status: Home / Self Care | Payer: Self-pay | Source: Ambulatory Visit | Attending: General Surgery

## 2015-12-31 ENCOUNTER — Encounter: Payer: Self-pay | Admitting: *Deleted

## 2015-12-31 ENCOUNTER — Ambulatory Visit
Admission: RE | Admit: 2015-12-31 | Discharge: 2015-12-31 | Disposition: A | Discharge status: Home / Self Care | Payer: Medicare Other | Source: Ambulatory Visit | Attending: General Surgery | Admitting: General Surgery

## 2015-12-31 DIAGNOSIS — K219 Gastro-esophageal reflux disease without esophagitis: Secondary | ICD-10-CM | POA: Diagnosis not present

## 2015-12-31 DIAGNOSIS — J449 Chronic obstructive pulmonary disease, unspecified: Secondary | ICD-10-CM | POA: Insufficient documentation

## 2015-12-31 DIAGNOSIS — E1151 Type 2 diabetes mellitus with diabetic peripheral angiopathy without gangrene: Secondary | ICD-10-CM | POA: Diagnosis not present

## 2015-12-31 DIAGNOSIS — L732 Hidradenitis suppurativa: Secondary | ICD-10-CM

## 2015-12-31 DIAGNOSIS — Z9989 Dependence on other enabling machines and devices: Secondary | ICD-10-CM | POA: Insufficient documentation

## 2015-12-31 DIAGNOSIS — Z87891 Personal history of nicotine dependence: Secondary | ICD-10-CM | POA: Insufficient documentation

## 2015-12-31 DIAGNOSIS — G4730 Sleep apnea, unspecified: Secondary | ICD-10-CM | POA: Insufficient documentation

## 2015-12-31 DIAGNOSIS — I10 Essential (primary) hypertension: Secondary | ICD-10-CM | POA: Insufficient documentation

## 2015-12-31 DIAGNOSIS — Z6841 Body Mass Index (BMI) 40.0 and over, adult: Secondary | ICD-10-CM | POA: Diagnosis not present

## 2015-12-31 HISTORY — PX: HYDRADENITIS EXCISION: SHX5243

## 2015-12-31 LAB — GLUCOSE, CAPILLARY
Glucose-Capillary: 113 mg/dL — ABNORMAL HIGH (ref 65–99)
Glucose-Capillary: 101 mg/dL — ABNORMAL HIGH (ref 65–99)
Glucose-Capillary: 188 mg/dL — ABNORMAL HIGH (ref 65–99)

## 2015-12-31 SURGERY — EXCISION, HIDRADENITIS, AXILLA
Anesthesia: Monitor Anesthesia Care | Site: Axilla | Laterality: Right | Wound class: Clean Contaminated

## 2015-12-31 MED ORDER — GLYCOPYRROLATE 0.2 MG/ML IJ SOLN
INTRAMUSCULAR | Status: DC | PRN
  Administered 2015-12-31: 0.2 mg via INTRAVENOUS

## 2015-12-31 MED ORDER — BACITRACIN ZINC 500 UNIT/GM EX OINT
TOPICAL_OINTMENT | CUTANEOUS | Status: AC
  Filled 2015-12-31: qty 56.7

## 2015-12-31 MED ORDER — IPRATROPIUM-ALBUTEROL 0.5-2.5 (3) MG/3ML IN SOLN
RESPIRATORY_TRACT | Status: AC
  Administered 2015-12-31: 3 mL
  Filled 2015-12-31: qty 3

## 2015-12-31 MED ORDER — FENTANYL CITRATE (PF) 100 MCG/2ML IJ SOLN: 25.0000 ug | INTRAMUSCULAR | Status: DC | PRN

## 2015-12-31 MED ORDER — IPRATROPIUM-ALBUTEROL 0.5-2.5 (3) MG/3ML IN SOLN: 3.0000 mL | Freq: Once | RESPIRATORY_TRACT | Status: DC

## 2015-12-31 MED ORDER — DEXTROSE-NACL 5-0.45 % IV SOLN
INTRAVENOUS | Status: DC | PRN
  Administered 2015-12-31: 11:00:00 via INTRAVENOUS

## 2015-12-31 MED ORDER — ONDANSETRON HCL 4 MG/2ML IJ SOLN: 4.0000 mg | Freq: Once | INTRAMUSCULAR | Status: DC | PRN

## 2015-12-31 MED ORDER — LIDOCAINE HCL (PF) 1 % IJ SOLN
INTRAMUSCULAR | Status: DC | PRN
  Administered 2015-12-31: 9 mL

## 2015-12-31 MED ORDER — PHENYLEPHRINE HCL 10 MG/ML IJ SOLN
INTRAMUSCULAR | Status: DC | PRN
  Administered 2015-12-31: 100 ug via INTRAVENOUS

## 2015-12-31 MED ORDER — MIDAZOLAM HCL 2 MG/2ML IJ SOLN
INTRAMUSCULAR | Status: DC | PRN
  Administered 2015-12-31: 2 mg via INTRAVENOUS

## 2015-12-31 MED ORDER — BUPIVACAINE HCL (PF) 0.5 % IJ SOLN
INTRAMUSCULAR | Status: DC | PRN
  Administered 2015-12-31: 10 mL

## 2015-12-31 MED ORDER — PROPOFOL 500 MG/50ML IV EMUL
INTRAVENOUS | Status: DC | PRN
Start: 2015-12-31 — End: 2015-12-31
  Administered 2015-12-31: 150 ug/kg/min via INTRAVENOUS

## 2015-12-31 MED ORDER — ROCURONIUM BROMIDE 100 MG/10ML IV SOLN
INTRAVENOUS | Status: DC | PRN
  Administered 2015-12-31: 20 mg via INTRAVENOUS

## 2015-12-31 MED ORDER — LIDOCAINE HCL (PF) 1 % IJ SOLN
INTRAMUSCULAR | Status: AC
  Filled 2015-12-31: qty 30

## 2015-12-31 MED ORDER — BACITRACIN ZINC 500 UNIT/GM EX OINT
TOPICAL_OINTMENT | CUTANEOUS | Status: AC
  Filled 2015-12-31: qty 0.9

## 2015-12-31 MED ORDER — SUGAMMADEX SODIUM 200 MG/2ML IV SOLN
INTRAVENOUS | Status: DC | PRN
  Administered 2015-12-31: 263 mg via INTRAVENOUS

## 2015-12-31 MED ORDER — ONDANSETRON HCL 4 MG/2ML IJ SOLN
INTRAMUSCULAR | Status: DC | PRN
  Administered 2015-12-31: 4 mg via INTRAVENOUS

## 2015-12-31 MED ORDER — DEXAMETHASONE SODIUM PHOSPHATE 4 MG/ML IJ SOLN
INTRAMUSCULAR | Status: DC | PRN
  Administered 2015-12-31: 10 mg via INTRAVENOUS

## 2015-12-31 MED ORDER — SODIUM CHLORIDE 0.9 % IV SOLN
INTRAVENOUS | Status: DC
  Administered 2015-12-31 (×2): via INTRAVENOUS

## 2015-12-31 MED ORDER — DEXTROSE 5 % IV SOLN
3.0000 g | INTRAVENOUS | Status: AC
  Administered 2015-12-31: 3 g via INTRAVENOUS
  Filled 2015-12-31: qty 3000

## 2015-12-31 MED ORDER — TRAMADOL HCL 50 MG PO TABS: 50.0000 mg | ORAL_TABLET | Freq: Four times a day (QID) | ORAL | 0 refills | Status: DC | PRN

## 2015-12-31 MED ORDER — BUPIVACAINE HCL (PF) 0.5 % IJ SOLN
INTRAMUSCULAR | Status: AC
  Filled 2015-12-31: qty 30

## 2015-12-31 SURGICAL SUPPLY — 24 items
BLADE SURG 15 STRL LF DISP TIS (BLADE) ×1 IMPLANT
BLADE SURG 15 STRL SS (BLADE) ×2
CANISTER SUCT 1200ML W/VALVE (MISCELLANEOUS) ×3 IMPLANT
CHLORAPREP W/TINT 26ML (MISCELLANEOUS) ×6 IMPLANT
DRAPE LAPAROTOMY 100X77 ABD (DRAPES) ×3 IMPLANT
ELECT CAUTERY BLADE 6.4 (BLADE) ×3 IMPLANT
ELECT REM PT RETURN 9FT ADLT (ELECTROSURGICAL) ×3
ELECTRODE REM PT RTRN 9FT ADLT (ELECTROSURGICAL) ×1 IMPLANT
GAUZE SPONGE 4X4 12PLY STRL (GAUZE/BANDAGES/DRESSINGS) ×3 IMPLANT
GLOVE BIO SURGEON STRL SZ7.5 (GLOVE) ×6 IMPLANT
GLOVE BIO SURGEON STRL SZ8 (GLOVE) ×3 IMPLANT
GLOVE INDICATOR 8.0 STRL GRN (GLOVE) ×6 IMPLANT
GOWN STRL REUS W/ TWL LRG LVL3 (GOWN DISPOSABLE) ×2 IMPLANT
GOWN STRL REUS W/TWL LRG LVL3 (GOWN DISPOSABLE) ×4
KIT RM TURNOVER STRD PROC AR (KITS) ×3 IMPLANT
LABEL OR SOLS (LABEL) ×3 IMPLANT
NEEDLE HYPO 25X1 1.5 SAFETY (NEEDLE) ×3 IMPLANT
NS IRRIG 500ML POUR BTL (IV SOLUTION) ×3 IMPLANT
PACK BASIN MINOR ARMC (MISCELLANEOUS) ×3 IMPLANT
SPONGE LAP 18X18 5 PK (GAUZE/BANDAGES/DRESSINGS) ×3 IMPLANT
SUT ETHILON 2 0 FS 18 (SUTURE) ×6 IMPLANT
SUT ETHILON 3-0 KS 30 BLK (SUTURE) ×3 IMPLANT
SYR BULB EAR ULCER 3OZ GRN STR (SYRINGE) ×3 IMPLANT
SYRINGE 10CC LL (SYRINGE) ×3 IMPLANT

## 2015-12-31 NOTE — H&P (View-Only) (Signed)
Outpatient Surgical Follow Up  12/15/2015  [name] [name] is an 60 y.o. female.   Chief Complaint  Patient presents with  . Follow-up    Hydradenitis    HPI: 60 year old female returns to clinic for follow-up for her hidradenitis. She reports that since her last visit the majority of the areas had actually improved. This is because she actually started taking the antibiotics as prescribed. She reports that her left axilla and posterior neck areas have healed up well and that the left breast and right axillary areas have improved but continued to have intermittent drainage. She denies any fevers, chills, nausea, vomiting, chest pain, diarrhea, constipation. She does have her chronic shortness of breath for which she is followed by pulmonary for. She is extremely interested in surgical excision at this time.  Past Medical History:  Diagnosis Date  . Allergic rhinitis   . Allergy   . COPD (chronic obstructive pulmonary disease) ([place])   . Degenerative joint disease of knee, left   . Depression   . Diabetes mellitus without complication ([place])   . Edema   . GERD (gastroesophageal reflux disease)   . Hidradenitis   . History of colonic polyps   . Hypertension   . IBS (irritable bowel syndrome)   . Insomnia   . Obesity   . Sinusitis, chronic   . Sleep apnea   . Thyroid disease   . Vaginitis, atrophic   . Vertigo   . Vitamin D deficiency     Past Surgical History:  Procedure Laterality Date  . ABDOMINAL HYSTERECTOMY    . CESAREAN SECTION     x 2  . CHOLECYSTECTOMY    . TONSILLECTOMY      Family History  Problem Relation Age of Onset  . Rashes / Skin problems Father   . Hypertension Father   . Breast cancer Paternal Grandmother     Social History:  reports that she has quit smoking. She has never used smokeless tobacco. She reports that she does not drink alcohol or use drugs.  Allergies:  Allergies  Allergen Reactions  . Codeine Itching    Medications  reviewed.    ROS A multipoint review of systems was obtained, all pertinent positives and negatives are documented within the history of present illness and remainder are negative.   BP (!) 111/52   Pulse (!) 102   Temp 98.3 F (36.8 C) (Oral)   Ht 5\' 3"  (1.6 m)   Wt 127 kg (280 lb)   BMI 49.60 kg/m   Physical Exam Gen.: No acute distress neck: Left posterior neck with visible scarring from hidradenitis. There is no active drainage or evidence of infection. Lymph nodes: Cervical and clavicular lymph node beds without active lymphadenopathy Chest: Diffuse wheezing throughout but equal wall motion on inspiration and expiration. Breast: Left breast with a 4 x 1 cm area of persistent induration and pustules consistent with hidradenitis at the site of previous abscess drainage. Abdomen: Large, soft, nontender Skin: Bilateral axilla with evidence hidradenitis. On her left side is almost completely resolved to scar tissue with a small subcentimeter area of persistent active hidradenitis. On her right side there is a 3 x 2 cm area of discoloration expressible drainage consistent with active hidradenitis. Extremities: Palpable edema in all 4 extremities.    No results found for this or any previous visit (from the past 48 hour(s)). No results found.  Assessment/Plan:  1. Hidradenitis 60 year old female with persistent, chronic hidradenitis with some persistent purulent drainage  to the left breast and right axilla. Her other sites are healing well on the conservative therapy. All areas actually improved. Discussed again the surgical option of local excision to the active areas in detail. Patient voiced understanding and desired to proceed. We'll tentatively plan for operative intervention on Friday, October 13. She will proceed to preoperative testing and have a preoperative anesthesia visit.     Clayburn Pert, MD FACS General Surgeon  12/15/2015,1:43 PM

## 2015-12-31 NOTE — Discharge Instructions (Signed)
PATIENT INSTRUCTIONS Excisional BIOPSY  FOLLOW-UP:  Please make an appointment with your physician in 1 week(s).  Call your physician immediately if you have any fevers greater than 102.5, drainage from you wound that is not clear or looks infected, persistent bleeding, or increasing breast pain not relieved with pain medication.    WOUND CARE INSTRUCTIONS:  Keep a dry clean dressing on the wound if there is drainage. The initial bandage may be removed after 24 hours.  Once the wound has quit draining you may leave it open to air.  If clothing rubs against the wound or causes irritation and the wound is not draining you may cover it with a dry dressing during the daytime.  Try to keep the wound dry and avoid ointments on the wound unless directed to do so.  If the wound becomes bright red and painful or starts to drain infected material that is not clear or slightly blood-tinged, please contact your physician immediately.  If the wound is mildly pink and has a thick firm ridge underneath it, this is normal, and is referred to as a healing ridge.  This will resolve over the next 4-6 weeks.  You may want to wear a sports bra post-operatively to give some additional support as well.  DIET:  You may eat any foods that you can tolerate.  It is a good idea to eat a high fiber diet and take in plenty of fluids to prevent constipation which can commonly be caused by prescription pain medication.  If you do become constipated you may want to take a mild laxative or take ducolax tablets on a daily basis until your bowel habits are regular.    MEDICATIONS:  Try to take narcotic medications and anti-inflammatory medications, such as tylenol, ibuprofen, naprosyn, etc., with food.  This will minimize stomach upset from the medication.  Should you develop nausea and vomiting from the pain medication, or develop a rash, please discontinue the medication and contact your physician.  You should not drive, make important  decisions, or operate machinery when taking narcotic pain medication.  QUESTIONS:  Please feel free to call your physician or the hospital operator if you have any questions, and they will be glad to assist you.   Your pathology results will likely not be available for at least 3-4 business days, depending on the pathology lab. The results will be discussed with you at your follow-up appointment.  General Anesthesia, Adult, Care After Refer to this sheet in the next few weeks. These instructions provide you with information on caring for yourself after your procedure. Your health care provider may also give you more specific instructions. Your treatment has been planned according to current medical practices, but problems sometimes occur. Call your health care provider if you have any problems or questions after your procedure. WHAT TO EXPECT AFTER THE PROCEDURE After the procedure, it is typical to experience:  Sleepiness.  Nausea and vomiting. HOME CARE INSTRUCTIONS  For the first 24 hours after general anesthesia:  Have a responsible person with you.  Do not drive a car. If you are alone, do not take public transportation.  Do not drink alcohol.  Do not take medicine that has not been prescribed by your health care provider.  Do not sign important papers or make important decisions.  You may resume a normal diet and activities as directed by your health care provider.  Change bandages (dressings) as directed.  If you have questions or problems that seem  related to general anesthesia, call the hospital and ask for the anesthetist or anesthesiologist on call. SEEK MEDICAL CARE IF:  You have nausea and vomiting that continue the day after anesthesia.  You develop a rash. SEEK IMMEDIATE MEDICAL CARE IF:   You have difficulty breathing.  You have chest pain.  You have any allergic problems.   This information is not intended to replace advice given to you by your health care  provider. Make sure you discuss any questions you have with your health care provider.   Document Released: 06/12/2000 Document Revised: 03/27/2014 Document Reviewed: 07/05/2011 Elsevier Interactive Patient Education [employer].

## 2015-12-31 NOTE — Anesthesia Procedure Notes (Signed)
Procedure Name: Intubation Date/Time: 12/31/2015 11:41 AM Performed by: Doreen Salvage Pre-anesthesia Checklist: Patient identified, Patient being monitored, Timeout performed, Emergency Drugs available and Suction available Patient Re-evaluated:Patient Re-evaluated prior to inductionOxygen Delivery Method: Circle system utilized Preoxygenation: Pre-oxygenation with 100% oxygen Ventilation: Oral airway inserted - appropriate to patient size Laryngoscope Size: Mac, 3 and McGraph Grade View: Grade I Tube type: Oral Tube size: 7.5 mm Number of attempts: 1 Airway Equipment and Method: Stylet Placement Confirmation: ETT inserted through vocal cords under direct vision,  positive ETCO2 and breath sounds checked- equal and bilateral Secured at: 24 cm Tube secured with: Tape Dental Injury: Teeth and Oropharynx as per pre-operative assessment  Comments: See quick note

## 2015-12-31 NOTE — Transfer of Care (Signed)
Immediate Anesthesia Transfer of Care Note  Patient: [name] [name]  Procedure(s) Performed: Procedure(s): EXCISION HIDRADENITIS AXILLA (Right)  Patient Location: PACU  Anesthesia Type:General  Level of Consciousness: awake, alert , oriented and sedated  Airway & Oxygen Therapy: Patient Spontanous Breathing and Patient connected to face mask oxygen  Post-op Assessment: Report given to RN and Post -op Vital signs reviewed and stable  Post vital signs: Reviewed and stable  Last Vitals:  Vitals:   12/31/15 1025  BP: 126/64  Pulse: 94  Resp: 16  Temp: 37.3 C    Last Pain:  Vitals:   12/31/15 1025  TempSrc: Tympanic         Complications: No apparent anesthesia complications

## 2015-12-31 NOTE — Progress Notes (Signed)
Awake. C/O shortness of breath. Resp adequate/nonlabored. O2 continued. BBS clear and equal but diminished. O2 sat 97% on O2 at 2 l/m.  Dr Andree Elk notified. Order given for Duo neb tx. Tx done. Tolerated well.

## 2015-12-31 NOTE — Op Note (Signed)
   Pre-operative Diagnosis: Hidradenitis of the right axilla and left breast  Post-operative Diagnosis: Same  Procedure: Excisional biopsy of right axillary hidradenitis and left breast hidradenitis  Surgeon: Clayburn Pert   Assistants: None  Anesthesia: Sedation followed by General endotracheal anesthesia  ASA Class: 3  Surgeon: Clayburn Pert, MD FACS  Anesthesia: Gen. with endotracheal tube  Assistant: None  Procedure Details  The patient was seen again in the Holding Room. The benefits, complications, treatment options, and expected outcomes were discussed with the patient. The risks of bleeding, infection, recurrence of symptoms, failure to resolve symptoms,  any of which could require further surgery were reviewed with the patient.   The patient was taken to Operating Room, identified as [name] [name] and the procedure verified.  A Time Out was held and the above information confirmed.  Prior to the induction of sedation, antibiotic prophylaxis was administered. VTE prophylaxis was in place. General endotracheal anesthesia was then administered and tolerated well. After the induction, the right axilla and left breast were prepped with Chloraprep and draped in the sterile fashion. The patient was positioned in the supine position.  The procedure began in the right axilla. The previously marked areas were localized with a 50-50 mixture 1% lidocaine 0.5% Marcaine plain. An elliptical incision was made around the visible and palpable hidradenitis. Then this was excised in toto with Bovie electrocautery. Meticulous hemostasis was insured and then the area was re-localized the aforementioned local anesthetic. The skin is reapproximated with interrupted vertical mattress 2-0 nylon sutures. The entire removed area measured approximately 5 x 3 cm.  We then proceeded to the left breast area the skin was localized on the lateral aspect of left breast with a visible and palpable  hidradenitis was present. After the skin was incised patient suffered laryngospasm and had to be converted to general endotracheal anesthesia. She tolerated this well. The mass again was circumferential freed up with Bovie electrocautery and excised in toto. Again meticulous hemostasis was insured and the entire area t was re-localized. The skin was closed using an interrupted vertical mattress 3-0 nylon suture. The entire removed area measured approximately 3 x 2 cm.  Bilateral sites were dressed with bacitracin and plain gauze that was secured with paper tape. Patient tolerated the remainder of the procedure well. There were no immediate, complications. All counts were correct at the end of the procedure. She was able to be extubated and transferred to the PACU in good condition.  Findings: Hidradenitis of the right axilla and left breast   Estimated Blood Loss: 10 mL         Drains: None         Specimens: Right axillary hidradenitis and left breast hidradenitis          Complications: Laryngospasm                  Condition: Good   Clayburn Pert, MD, FACS

## 2015-12-31 NOTE — Anesthesia Procedure Notes (Signed)
Date/Time: 12/31/2015 11:15 AM Performed by: Nelda Marseille Pre-anesthesia Checklist: Patient identified, Emergency Drugs available, Suction available, Patient being monitored and Timeout performed Oxygen Delivery Method: Simple face mask Preoxygenation: Pre-oxygenation with 100% oxygen

## 2015-12-31 NOTE — Interval H&P Note (Signed)
History and Physical Interval Note:  12/31/2015 10:25 AM  [name] [name]  has presented today for surgery, with the diagnosis of hidradenitis right axillary, left breast  The various methods of treatment have been discussed with the patient and family. After consideration of risks, benefits and other options for treatment, the patient has consented to  Procedure(s): EXCISION HIDRADENITIS AXILLA (Right) as a surgical intervention .  The patient's history has been reviewed, patient examined, no change in status, stable for surgery.  I have reviewed the patient's chart and labs.  Questions were answered to the patient's satisfaction.    On exam: Patient's heart is of regular rate and rhythm today.   Clayburn Pert

## 2015-12-31 NOTE — Progress Notes (Signed)
Dr Andree Elk at bedside to talk with pt. Cleared for d/c to SDS. Pt states that her breathing is easier,but just her throat is sore. Ok to transfer pt to SDS on Nasal Cannula at 2 l/m.

## 2015-12-31 NOTE — Brief Op Note (Signed)
12/31/2015  12:08 PM  PATIENT:  [name] [name]  60 y.o. female  PRE-OPERATIVE DIAGNOSIS:  hidradenitis right axillary, left breast  POST-OPERATIVE DIAGNOSIS:  hidradenitis right axillary, left breast  PROCEDURE:  Procedure(s): EXCISION HIDRADENITIS AXILLA (Right) AND Excision of left breast hidradenitis  SURGEON:  Surgeon(s) and Role:    * Clayburn Pert, MD - Primary  PHYSICIAN ASSISTANT:   ASSISTANTS: none   ANESTHESIA:   general  EBL:  No intake/output data recorded.  BLOOD ADMINISTERED:none  DRAINS: none   LOCAL MEDICATIONS USED:  MARCAINE   , XYLOCAINE  and Amount: 29 ml  SPECIMEN:  Source of Specimen:  right axilla and left breast  DISPOSITION OF SPECIMEN:  PATHOLOGY  COUNTS:  YES  TOURNIQUET:  * No tourniquets in log *  DICTATION: .Dragon Dictation  PLAN OF CARE: Discharge to home after PACU  PATIENT DISPOSITION:  PACU - hemodynamically stable.   Delay start of Pharmacological VTE agent (>24hrs) due to surgical blood loss or risk of bleeding: no

## 2015-12-31 NOTE — Anesthesia Preprocedure Evaluation (Signed)
Anesthesia Evaluation  Patient identified by MRN, date of birth, ID band Patient awake    Reviewed: Allergy & Precautions, H&P , NPO status , Patient's Chart, lab work & pertinent test results, reviewed documented beta blocker date and time   Airway Mallampati: II  TM Distance: >3 FB Neck ROM: full    Dental no notable dental hx. (+) Teeth Intact   Pulmonary neg pulmonary ROS, shortness of breath and with exertion, asthma , sleep apnea and Continuous Positive Airway Pressure Ventilation , pneumonia, resolved, COPD,  COPD inhaler, former smoker,    Pulmonary exam normal breath sounds clear to auscultation       Cardiovascular Exercise Tolerance: Good hypertension, + Peripheral Vascular Disease  negative cardio ROS   Rhythm:regular Rate:Normal     Neuro/Psych PSYCHIATRIC DISORDERS negative neurological ROS  negative psych ROS   GI/Hepatic negative GI ROS, Neg liver ROS, GERD  Medicated,  Endo/Other  negative endocrine ROSdiabetesHypothyroidism Morbid obesity  Renal/GU      Musculoskeletal   Abdominal   Peds  Hematology negative hematology ROS (+)   Anesthesia Other Findings   Reproductive/Obstetrics negative OB ROS                             Anesthesia Physical Anesthesia Plan  ASA: IV  Anesthesia Plan: MAC   Post-op Pain Management:    Induction:   Airway Management Planned:   Additional Equipment:   Intra-op Plan:   Post-operative Plan:   Informed Consent: I have reviewed the patients History and Physical, chart, labs and discussed the procedure including the risks, benefits and alternatives for the proposed anesthesia with the patient or authorized representative who has indicated his/her understanding and acceptance.     Plan Discussed with: CRNA  Anesthesia Plan Comments:         Anesthesia Quick Evaluation

## 2016-01-03 ENCOUNTER — Telehealth: Payer: Self-pay | Admitting: General Surgery

## 2016-01-03 LAB — SURGICAL PATHOLOGY

## 2016-01-03 NOTE — Anesthesia Postprocedure Evaluation (Signed)
Anesthesia Post Note  Patient: [name] [name]  Procedure(s) Performed: Procedure(s) (LRB): EXCISION HIDRADENITIS AXILLA (Right)  Patient location during evaluation: PACU Anesthesia Type: General Level of consciousness: awake and alert Pain management: pain level controlled Vital Signs Assessment: post-procedure vital signs reviewed and stable Respiratory status: spontaneous breathing, nonlabored ventilation, respiratory function stable and patient connected to nasal cannula oxygen Cardiovascular status: blood pressure returned to baseline and stable Postop Assessment: no signs of nausea or vomiting Anesthetic complications: no    Last Vitals:  Vitals:   12/31/15 1335 12/31/15 1400  BP: (!) 120/49 117/85  Pulse: (!) 108 (!) 106  Resp: 18 18  Temp: 36.6 C     Last Pain:  Vitals:   01/03/16 0838  TempSrc:   PainSc: 0-No pain                 Molli Barrows

## 2016-01-03 NOTE — Telephone Encounter (Signed)
Patient has called and stated that since her procedure she is coughing up green mucus and she would like to be prescribed something to clear this up.   I have spoke with Dr Burt Knack on call and he states that he suggest the patient contact her PCP.   I have called the patient back and left her this information on her voicemail and asked that if she has any other concerns to please call us back.

## 2016-01-04 ENCOUNTER — Encounter: Payer: Self-pay | Admitting: General Surgery

## 2016-01-07 ENCOUNTER — Other Ambulatory Visit: Payer: Self-pay

## 2016-01-07 ENCOUNTER — Encounter: Payer: Self-pay | Admitting: General Surgery

## 2016-01-07 ENCOUNTER — Ambulatory Visit (INDEPENDENT_AMBULATORY_CARE_PROVIDER_SITE_OTHER): Payer: Medicare Other | Admitting: General Surgery

## 2016-01-07 VITALS — BP 137/65 | HR 86 | Temp 98.3°F | Ht 63.0 in

## 2016-01-07 DIAGNOSIS — Z4889 Encounter for other specified surgical aftercare: Secondary | ICD-10-CM

## 2016-01-07 MED ORDER — BENZONATATE 100 MG PO CAPS: 100.0000 mg | ORAL_CAPSULE | Freq: Three times a day (TID) | ORAL | 0 refills | Status: DC | PRN

## 2016-01-07 NOTE — Patient Instructions (Addendum)
We would like for you to follow up with Dr. Posey Pronto next week on Monday 01/10/2016 at 2:45 Pm. Please see your follow up appointment with Dr.Cooper for next week. Please change your dressing daily. Please call our office if you have questions or concerns.

## 2016-01-07 NOTE — Progress Notes (Signed)
Outpatient Surgical Follow Up  01/07/2016  [name] [name] is an 60 y.o. female.   Chief Complaint  Patient presents with  . Routine Post Op    Hidradenitis of Right axilla & Left Breast 12/31/2015 Dr. Adonis Huguenin    HPI: 60 year old female returns to clinic 1 week status post excision of hidradenitis from the right axilla and left breast. Patient states that she has had horrible cough that has been providing her from sleeping since the operation. She has been restarted on oxygen. She also states that whenever coughing fits she tore open the surgical site of her left breast. She is unsure of when it happened but thinks it happens during a coughing fit. She denies any current fevers, chills, chest pain, nausea, vomiting, diarrhea, constipation. She has been having some drainage from the breast site but the axilla has done well.  Past Medical History:  Diagnosis Date  . Allergic rhinitis   . Allergy   . Back pain   . COPD (chronic obstructive pulmonary disease) ([place])   . Degenerative joint disease of knee, left   . Depression   . Diabetes mellitus without complication ([place])   . Edema   . Elevated lipids   . GERD (gastroesophageal reflux disease)   . Hidradenitis   . History of colonic polyps   . Hypertension   . Hypothyroidism   . IBS (irritable bowel syndrome)   . Insomnia   . Obesity   . Oxygen dependent   . Pneumonia   . PVD (peripheral vascular disease) ([place])   . Sinusitis, chronic   . Sleep apnea   . Thyroid disease   . Vaginitis, atrophic   . Vertigo   . Vitamin D deficiency     Past Surgical History:  Procedure Laterality Date  . ABDOMINAL HYSTERECTOMY    . AXILLARY HIDRADENITIS EXCISION    . CESAREAN SECTION     x 2  . CHOLECYSTECTOMY    . HEEL SPUR EXCISION N/A   . HYDRADENITIS EXCISION Right 12/31/2015   Procedure: EXCISION HIDRADENITIS AXILLA;  Surgeon: Clayburn Pert, MD;  Location: [facility] ORS;  Service: General;  Laterality: Right;  . TONSILLECTOMY       Family History  Problem Relation Age of Onset  . Rashes / Skin problems Father   . Hypertension Father   . Breast cancer Paternal Grandmother     Social History:  reports that she quit smoking about 21 years ago. She has never used smokeless tobacco. She reports that she does not drink alcohol or use drugs.  Allergies:  Allergies  Allergen Reactions  . Codeine Itching    Medications reviewed.    ROS A multipoint review of systems was completed, all pertinent positives and negatives are documented within the history of present illness remainder negative.   BP 137/65 (BP Location: Right Arm, Patient Position: Sitting)   Pulse 86   Temp 98.3 F (36.8 C) (Oral)   Ht 5\' 3"  (1.6 m)   Physical Exam Gen.: No acute distress Chest: Coarse breath sounds throughout bilaterally Heart: Regular rate and rhythm Abdomen: Soft and nontender Skin: Right axillary incision site well approximated with nylon sutures. No evidence of spreading erythema or drainage. Left breast excision site with approximate 1-1/2 cm disruption of the center of the incision site. The remainder of the incision site is well approximated without any evidence of erythema. There is some serosanguineous drainage on the dressing that was changed.    No results found for this or any  previous visit (from the past 48 hour(s)). No results found.  Assessment/Plan:  1. Aftercare following surgery 60 year old female one week status post excision of hidradenitis from the right axilla and left breast. Having chronic cough. Tessalon prescription ordered for her and patient made a prescription with her primary care. Discussed continuing wound care to both sites but especially the one that came open. Sutures not currently ready to be removed. She will follow up in clinic next week with my partner, Dr. Burt Knack, evaluate whether not the sutures are ready to be removed. She will follow up with me again in the week after  that.     Clayburn Pert, MD FACS General Surgeon  01/07/2016,12:21 PM

## 2016-01-12 ENCOUNTER — Encounter: Payer: Self-pay | Admitting: Surgery

## 2016-01-12 ENCOUNTER — Ambulatory Visit (INDEPENDENT_AMBULATORY_CARE_PROVIDER_SITE_OTHER): Payer: Medicare Other | Admitting: Surgery

## 2016-01-12 VITALS — BP 106/65 | HR 92 | Temp 97.8°F | Ht 63.0 in

## 2016-01-12 DIAGNOSIS — L732 Hidradenitis suppurativa: Secondary | ICD-10-CM

## 2016-01-12 NOTE — Progress Notes (Signed)
Patient first-day after hidradenitis excised from right axilla and left breast area She has no complaints.  Physical exam demonstrates a morbidly obese female patient wheelchair. Right axillary wound is healing well no erythema no drainage half of the sutures are removed.  The left breast area has completely dehisced leaving approximately 2.5 x 2.5 cm opening. There is granulation tissue without erythema or drainage. The sutures are removed.  Silver nitrate is applied to the left rest wound.  Patient is using bacitracin on the wounds. I recommend that she continue that and will see her back next week for follow-up.

## 2016-01-12 NOTE — Patient Instructions (Signed)
Please see your follow up appointment listed below. Please apply antibiotic ointment to the area and change your dressing daily. Please call our office if you have any questions or concerns.

## 2016-01-13 ENCOUNTER — Encounter: Payer: Self-pay | Admitting: Surgery

## 2016-01-14 ENCOUNTER — Telehealth: Payer: Self-pay

## 2016-01-14 NOTE — Telephone Encounter (Signed)
Patient called in and would like a refill on her Ultram. I explained that I would speak with Dr. Adonis Huguenin on Monday in regards to this but that I do not have a physician in the office at this time. I encouraged the patient to mention the need for any medication refills at her appointments or to call in prior to Friday afternoon when she anticipates running out over the weekend.

## 2016-01-17 MED ORDER — TRAMADOL HCL 50 MG PO TABS: 50.0000 mg | ORAL_TABLET | Freq: Four times a day (QID) | ORAL | 0 refills | Status: DC | PRN

## 2016-01-17 NOTE — Telephone Encounter (Signed)
Spoke with Dr. Adonis Huguenin in regards to this. He has refilled patient's Ultram. Patient would like to pick up in [place] office tomorrow.  Prescription will be available in [facility] tomorrow for patient pick-up.

## 2016-01-20 ENCOUNTER — Encounter: Payer: Self-pay | Admitting: General Surgery

## 2016-01-20 ENCOUNTER — Ambulatory Visit (INDEPENDENT_AMBULATORY_CARE_PROVIDER_SITE_OTHER): Payer: Medicare Other | Admitting: General Surgery

## 2016-01-20 VITALS — BP 123/62 | HR 100 | Temp 97.9°F | Ht 63.0 in | Wt 274.0 lb

## 2016-01-20 DIAGNOSIS — Z4889 Encounter for other specified surgical aftercare: Secondary | ICD-10-CM

## 2016-01-20 NOTE — Progress Notes (Signed)
Outpatient Surgical Follow Up  01/20/2016  [name] [name] is an 60 y.o. female.   Chief Complaint  Patient presents with  . Routine Post Op    Excision of Left Breast and Right Axilla Hidradenitis-Dr.Wilbur [place]-12/31/15    HPI: 60 year old female returns to clinic 3 weeks status post excision of hidradenitis from the right axilla and left breast. The left breast wound opened up prior to her last visit and was treated with silver nitrate. She reports having some clear drainage on her bandages but she is only having to change her bandage once a day. The right axilla looks good. She denies any fevers, chills, nausea, vomiting, chest pain, short of breath.  Past Medical History:  Diagnosis Date  . Allergic rhinitis   . Allergy   . Back pain   . COPD (chronic obstructive pulmonary disease) ([place])   . Degenerative joint disease of knee, left   . Depression   . Diabetes mellitus without complication ([place])   . Edema   . Elevated lipids   . GERD (gastroesophageal reflux disease)   . Hidradenitis   . History of colonic polyps   . Hypertension   . Hypothyroidism   . IBS (irritable bowel syndrome)   . Insomnia   . Obesity   . Oxygen dependent   . Pneumonia   . PVD (peripheral vascular disease) ([place])   . Sinusitis, chronic   . Sleep apnea   . Thyroid disease   . Vaginitis, atrophic   . Vertigo   . Vitamin D deficiency     Past Surgical History:  Procedure Laterality Date  . ABDOMINAL HYSTERECTOMY    . AXILLARY HIDRADENITIS EXCISION    . CESAREAN SECTION     x 2  . CHOLECYSTECTOMY    . HEEL SPUR EXCISION N/A   . HYDRADENITIS EXCISION Right 12/31/2015   Procedure: EXCISION HIDRADENITIS AXILLA;  Surgeon: Clayburn Pert, MD;  Location: [facility] ORS;  Service: General;  Laterality: Right;  . TONSILLECTOMY      Family History  Problem Relation Age of Onset  . Rashes / Skin problems Father   . Hypertension Father   . Breast cancer Paternal Grandmother     Social History:  reports  that she quit smoking about 21 years ago. She has never used smokeless tobacco. She reports that she does not drink alcohol or use drugs.  Allergies:  Allergies  Allergen Reactions  . Codeine Itching    Medications reviewed.    ROS A multipoint review of systems was completed, all pertinent positives and negatives are documented within the history of present illness and remainder are negative.   BP 123/62   Pulse 100   Temp 97.9 F (36.6 C) (Oral)   Ht 5\' 3"  (1.6 m)   Wt 124.3 kg (274 lb)   BMI 48.54 kg/m   Physical Exam Gen.: No acute distress Neck: Supple and nontender with a area of hidradenitis the posterior left neck without active infection Chest: Clear to auscultation Heart: Regular rate and rhythm Abdomen: Soft and nontender Skin: Right axillary hidradenitis section with suture still in place but no evidence of erythema or drainage. Area of hidradenitis cephalad to this without active infection either. Left breast site skin is wide open with healthy-appearing granulation tissue throughout the entirety of the wound wound measures approximately 5 x 3 cm.    No results found for this or any previous visit (from the past 48 hour(s)). No results found.  Assessment/Plan:  1. Aftercare  following surgery 60 year old female status post excision of hidradenitis from the left breast and right axilla. Sutures removed from the right axilla and replaced with Steri-Strips today. Left breast area that was previously treated with silver nitrate by Dr. Burt Knack appears to be improving. Instructed her to start using an antibiotic ointment with her daily dressing changes twice a day. She is to follow-up in clinic in 1 week for additional wound check. Patient voiced understanding.     Clayburn Pert, MD FACS General Surgeon  01/20/2016,2:19 PM

## 2016-01-20 NOTE — Patient Instructions (Signed)
Please begin applying the antibiotic ointment to both incisional areas daily. Please apply the ointment to the dressing and change twice daily. Please see your follow up appointment listed below. Please call our office if you have questions or concerns.

## 2016-01-21 ENCOUNTER — Telehealth: Payer: Self-pay | Admitting: General Surgery

## 2016-01-21 NOTE — Telephone Encounter (Signed)
Spoke with patient at this time. She stated she does not see any of the steri-strips that were placed under her arm pit yesterday. She was asked to come to the [place] office so we could take a look and she stated she would after she takes her shower. I let her know we close the office early on Fridays.

## 2016-01-21 NOTE — Telephone Encounter (Signed)
Placed steri-strips on area at this time. Incision was not draining and was well cleaned. Patient has follow up scheduled next week.

## 2016-01-21 NOTE — Telephone Encounter (Signed)
Patient was in yesterday and today she stated that they put a steri strip under her right arm pit and today she doesn't see it. She said she was told it should stay on for 10 days and is wondering if she needs another one put on. Please call

## 2016-01-27 ENCOUNTER — Encounter: Payer: Self-pay | Admitting: Surgery

## 2016-01-28 ENCOUNTER — Encounter: Payer: Self-pay | Admitting: Surgery

## 2016-02-01 ENCOUNTER — Ambulatory Visit (INDEPENDENT_AMBULATORY_CARE_PROVIDER_SITE_OTHER): Payer: Medicare Other | Admitting: Surgery

## 2016-02-01 ENCOUNTER — Encounter: Payer: Self-pay | Admitting: Surgery

## 2016-02-01 VITALS — BP 108/48 | HR 87 | Temp 97.7°F | Wt 274.0 lb

## 2016-02-01 DIAGNOSIS — L732 Hidradenitis suppurativa: Secondary | ICD-10-CM

## 2016-02-01 NOTE — Progress Notes (Signed)
02/01/2016  HPI: Patient is status post excisional biopsy of right axillary hydradenitis and left breast hydradenitis on 10/13 with Dr. Adonis Huguenin. She has been seen in the office a few times since her surgery for wound checks. Since her last visit on 11/2 she reported she continues to do well in that her right axillary incision has healed and she continues to apply antibiotic ointment to the left breast wound twice daily with gauze dressing changes. She denies having any worsening pain or worsening drainage.  Vital signs: BP (!) 108/48   Pulse 87   Temp 97.7 F (36.5 C) (Oral)   Wt 124.3 kg (274 lb)   BMI 48.54 kg/m    Physical Exam: Constitutional: No acute distress Skin: Right axillary incision is well-healed with no evidence of infection or induration and no drainage. The left breast wound measures approximately 3 cm x 2 cm with very healthy granulation tissue at the wound base. No evidence of infection or induration either.  Assessment/Plan: 60 year old female status post excisional biopsy of right axillary hydradenitis and left breast hydradenitis on 10/13.  Patient continues to heal well and requires no further dressings on the right axilla. She asked if she could use a razor to shave her hair in the axilla and this should be fine. Have also instructed the patient to continue doing her dressing changes twice daily with antibiotic ointment and gauze over the left breast wound. This is healing very well.  She will follow up in 2 weeks for another wound check.   Melvyn Neth, [place]

## 2016-02-01 NOTE — Patient Instructions (Signed)
Please continue to change your dressing twice a day.

## 2016-02-16 ENCOUNTER — Encounter: Payer: Self-pay | Admitting: Surgery

## 2016-02-24 ENCOUNTER — Encounter: Payer: Self-pay | Admitting: General Surgery

## 2016-03-09 ENCOUNTER — Telehealth: Payer: Self-pay | Admitting: Surgery

## 2016-03-09 NOTE — Telephone Encounter (Signed)
Left voice message with patient to call and schedule for anal lesion. Referred by Princella Ion

## 2016-03-17 ENCOUNTER — Ambulatory Visit: Payer: Medicare Other | Admitting: Surgery

## 2016-03-22 ENCOUNTER — Telehealth: Payer: Self-pay | Admitting: General Surgery

## 2016-03-22 NOTE — Telephone Encounter (Signed)
Left breast is bleeding with a little bit of pus. She only wants to see Dr. Adonis Huguenin in the [facility] office since the hospital can take her up stairs. She said to tell you that she is out doing errands but call her this afternoon.

## 2016-03-22 NOTE — Telephone Encounter (Signed)
Called patient back and left her a voicemail to return my call. 

## 2016-03-23 NOTE — Telephone Encounter (Signed)
Phone call returned once again to patient. Unable to leave message as phone continued to ring. I would be glad to speak with patient, should she return phone calls.

## 2016-03-27 ENCOUNTER — Other Ambulatory Visit: Payer: Self-pay

## 2016-03-28 ENCOUNTER — Ambulatory Visit (INDEPENDENT_AMBULATORY_CARE_PROVIDER_SITE_OTHER): Payer: Medicare Other | Admitting: General Surgery

## 2016-03-28 ENCOUNTER — Encounter: Payer: Self-pay | Admitting: General Surgery

## 2016-03-28 VITALS — BP 105/54 | HR 94 | Temp 98.3°F | Ht 63.0 in | Wt 292.4 lb

## 2016-03-28 DIAGNOSIS — L732 Hidradenitis suppurativa: Secondary | ICD-10-CM | POA: Diagnosis not present

## 2016-03-28 NOTE — Progress Notes (Signed)
Outpatient Surgical Follow Up  03/28/2016  [name] [name] is an 61 y.o. female.   Chief Complaint  Patient presents with  . Follow-up    Abdominal Hidradenitis and bleeding on her breast    HPI: 61 year old female well-known the surgery service returns to clinic for follow-up for hidradenitis. A shunt reports that her usual areas of hidradenitis of layered again on her left breast, left axilla, left posterior neck. She's developed a new area of hidradenitis to her left groin region. The groin area is the largest of before and is the most recently to have drained. She is also had an area from her rectum that her primary care provider wanted her to have evaluated by a surgeon. She states that she had significant pain to her rectum after a large bowel movement followed by a palpable bulge. This area has become inflamed due to with wiping but has improved with the use of topical Proctofoam. She denies any current fevers, chills, nausea, vomiting, diarrhea, constipation. She has a chronic shortness of breath.  Past Medical History:  Diagnosis Date  . Allergic rhinitis   . Allergy   . Back pain   . COPD (chronic obstructive pulmonary disease) ([place])   . Degenerative joint disease of knee, left   . Depression   . Diabetes mellitus without complication ([place])   . Edema   . Elevated lipids   . GERD (gastroesophageal reflux disease)   . Hidradenitis   . History of colonic polyps   . Hypertension   . Hypothyroidism   . IBS (irritable bowel syndrome)   . Insomnia   . Obesity   . Oxygen dependent   . Pneumonia   . PVD (peripheral vascular disease) ([place])   . Sinusitis, chronic   . Sleep apnea   . Thyroid disease   . Vaginitis, atrophic   . Vertigo   . Vitamin D deficiency     Past Surgical History:  Procedure Laterality Date  . ABDOMINAL HYSTERECTOMY    . AXILLARY HIDRADENITIS EXCISION    . CESAREAN SECTION     x 2  . CHOLECYSTECTOMY    . HEEL SPUR EXCISION N/A   . HYDRADENITIS  EXCISION Right 12/31/2015   Procedure: EXCISION HIDRADENITIS AXILLA;  Surgeon: Clayburn Pert, MD;  Location: [facility] ORS;  Service: General;  Laterality: Right;  . TONSILLECTOMY      Family History  Problem Relation Age of Onset  . Rashes / Skin problems Father   . Hypertension Father   . Breast cancer Paternal Grandmother     Social History:  reports that she quit smoking about 21 years ago. She has never used smokeless tobacco. She reports that she does not drink alcohol or use drugs.  Allergies:  Allergies  Allergen Reactions  . Codeine Itching    Medications reviewed.    ROS A multipoint review of systems was completed. All pertinent positives and negatives are documented within the history of present illness and remainder are negative.   BP (!) 105/54   Pulse 94   Temp 98.3 F (36.8 C) (Oral)   Ht 5\' 3"  (1.6 m)   Wt 132.6 kg (292 lb 6.4 oz)   BMI 51.80 kg/m   Physical Exam Gen.: No acute distress Chest: Clear to auscultation Heart: Regular rhythm Abdomen: Large, soft, nontender. Skin: Left lateral breast with 2 openings consistent with recurrent hidradenitis that were treated with silver nitrate. No purulent discharge. Left axilla with expressible purulence or by single pinpoint area of  the chronic hidradenitis. This was expressed and redressed with a planned dressing. Left posterior neck with chronic hidradenitis scarring but no active drainage or expressible purulence. Left groin with a large new area of hidradenitis with a central area of induration surrounded by approximately 3 cm of hyperemia. No expressible drainage currently. Rectum: A decompressed external hemorrhoid is visualized. No active bleeding or evidence of other abnormality.    No results found for this or any previous visit (from the past 48 hour(s)). No results found.  Assessment/Plan:  1. Hidradenitis 61 year old female with continued problems with hidradenitis. Again reiterated that the  underlying cause for hidradenitis his obesity and poorly controlled diabetes. She voiced understanding and stated she will attempt to be better. She's had increased swelling as well as worsening drove her diabetes and she will follow up with her primary care and her cardiologist. Burnis Medin provide with refills for the topical clindamycin and discussed that should the area in her groin worsen in any shape or form that she'll need take a course of oral antibiotics. The external hemorrhoid is improving and there is no indication for intervention at this time for her hemorrhoids. She will follow-up in clinic on an as-needed basis should any further areas of hidradenitis worsen or fail to heal.  A total of 25 minutes was used for this encounter with greater than 50% of the time use for counseling and coordination of care.     Clayburn Pert, MD FACS General Surgeon  03/28/2016,4:19 PM

## 2016-03-28 NOTE — Patient Instructions (Signed)
PLease call our office if you have any questions or concerns.

## 2016-05-20 ENCOUNTER — Inpatient Hospital Stay
Admission: EM | Admit: 2016-05-20 | Discharge: 2016-05-24 | DRG: 190 | Disposition: A | Discharge status: Home / Self Care | Payer: Medicaid Other | Attending: Internal Medicine | Admitting: Internal Medicine

## 2016-05-20 ENCOUNTER — Emergency Department: Payer: Medicaid Other

## 2016-05-20 DIAGNOSIS — J209 Acute bronchitis, unspecified: Secondary | ICD-10-CM | POA: Diagnosis present

## 2016-05-20 DIAGNOSIS — I509 Heart failure, unspecified: Secondary | ICD-10-CM

## 2016-05-20 DIAGNOSIS — K219 Gastro-esophageal reflux disease without esophagitis: Secondary | ICD-10-CM | POA: Diagnosis present

## 2016-05-20 DIAGNOSIS — Z90710 Acquired absence of both cervix and uterus: Secondary | ICD-10-CM

## 2016-05-20 DIAGNOSIS — G4733 Obstructive sleep apnea (adult) (pediatric): Secondary | ICD-10-CM | POA: Diagnosis present

## 2016-05-20 DIAGNOSIS — E876 Hypokalemia: Secondary | ICD-10-CM | POA: Diagnosis present

## 2016-05-20 DIAGNOSIS — E1151 Type 2 diabetes mellitus with diabetic peripheral angiopathy without gangrene: Secondary | ICD-10-CM | POA: Diagnosis present

## 2016-05-20 DIAGNOSIS — L03116 Cellulitis of left lower limb: Secondary | ICD-10-CM | POA: Diagnosis present

## 2016-05-20 DIAGNOSIS — Z9981 Dependence on supplemental oxygen: Secondary | ICD-10-CM

## 2016-05-20 DIAGNOSIS — I5033 Acute on chronic diastolic (congestive) heart failure: Secondary | ICD-10-CM | POA: Diagnosis present

## 2016-05-20 DIAGNOSIS — J441 Chronic obstructive pulmonary disease with (acute) exacerbation: Principal | ICD-10-CM | POA: Diagnosis present

## 2016-05-20 DIAGNOSIS — E785 Hyperlipidemia, unspecified: Secondary | ICD-10-CM | POA: Diagnosis present

## 2016-05-20 DIAGNOSIS — Z794 Long term (current) use of insulin: Secondary | ICD-10-CM

## 2016-05-20 DIAGNOSIS — L732 Hidradenitis suppurativa: Secondary | ICD-10-CM | POA: Diagnosis present

## 2016-05-20 DIAGNOSIS — J440 Chronic obstructive pulmonary disease with acute lower respiratory infection: Secondary | ICD-10-CM | POA: Diagnosis present

## 2016-05-20 DIAGNOSIS — G4730 Sleep apnea, unspecified: Secondary | ICD-10-CM | POA: Diagnosis present

## 2016-05-20 DIAGNOSIS — I110 Hypertensive heart disease with heart failure: Secondary | ICD-10-CM | POA: Diagnosis present

## 2016-05-20 DIAGNOSIS — Z885 Allergy status to narcotic agent status: Secondary | ICD-10-CM

## 2016-05-20 DIAGNOSIS — R0609 Other forms of dyspnea: Secondary | ICD-10-CM

## 2016-05-20 DIAGNOSIS — D649 Anemia, unspecified: Secondary | ICD-10-CM | POA: Diagnosis present

## 2016-05-20 DIAGNOSIS — D75839 Thrombocytosis, unspecified: Secondary | ICD-10-CM

## 2016-05-20 DIAGNOSIS — T380X5A Adverse effect of glucocorticoids and synthetic analogues, initial encounter: Secondary | ICD-10-CM | POA: Diagnosis present

## 2016-05-20 DIAGNOSIS — L03115 Cellulitis of right lower limb: Secondary | ICD-10-CM | POA: Diagnosis present

## 2016-05-20 DIAGNOSIS — Z825 Family history of asthma and other chronic lower respiratory diseases: Secondary | ICD-10-CM

## 2016-05-20 DIAGNOSIS — Z7982 Long term (current) use of aspirin: Secondary | ICD-10-CM

## 2016-05-20 DIAGNOSIS — Z87891 Personal history of nicotine dependence: Secondary | ICD-10-CM

## 2016-05-20 DIAGNOSIS — M1990 Unspecified osteoarthritis, unspecified site: Secondary | ICD-10-CM | POA: Diagnosis present

## 2016-05-20 DIAGNOSIS — D473 Essential (hemorrhagic) thrombocythemia: Secondary | ICD-10-CM

## 2016-05-20 DIAGNOSIS — Z79899 Other long term (current) drug therapy: Secondary | ICD-10-CM

## 2016-05-20 DIAGNOSIS — Z23 Encounter for immunization: Secondary | ICD-10-CM

## 2016-05-20 DIAGNOSIS — D509 Iron deficiency anemia, unspecified: Secondary | ICD-10-CM | POA: Diagnosis present

## 2016-05-20 DIAGNOSIS — Z8249 Family history of ischemic heart disease and other diseases of the circulatory system: Secondary | ICD-10-CM

## 2016-05-20 DIAGNOSIS — D72829 Elevated white blood cell count, unspecified: Secondary | ICD-10-CM

## 2016-05-20 DIAGNOSIS — Z9049 Acquired absence of other specified parts of digestive tract: Secondary | ICD-10-CM

## 2016-05-20 DIAGNOSIS — J9621 Acute and chronic respiratory failure with hypoxia: Secondary | ICD-10-CM | POA: Diagnosis present

## 2016-05-20 DIAGNOSIS — Z7951 Long term (current) use of inhaled steroids: Secondary | ICD-10-CM

## 2016-05-20 DIAGNOSIS — M7989 Other specified soft tissue disorders: Secondary | ICD-10-CM | POA: Diagnosis present

## 2016-05-20 DIAGNOSIS — R7989 Other specified abnormal findings of blood chemistry: Secondary | ICD-10-CM | POA: Diagnosis present

## 2016-05-20 DIAGNOSIS — E039 Hypothyroidism, unspecified: Secondary | ICD-10-CM | POA: Diagnosis present

## 2016-05-20 DIAGNOSIS — Z803 Family history of malignant neoplasm of breast: Secondary | ICD-10-CM

## 2016-05-20 DIAGNOSIS — E559 Vitamin D deficiency, unspecified: Secondary | ICD-10-CM | POA: Diagnosis present

## 2016-05-20 DIAGNOSIS — I272 Pulmonary hypertension, unspecified: Secondary | ICD-10-CM | POA: Diagnosis present

## 2016-05-20 LAB — CBC WITH DIFFERENTIAL/PLATELET
BASOS ABS: 0.1 10*3/uL (ref 0–0.1)
Basophils Relative: 1 %
Eosinophils Absolute: 0.3 10*3/uL (ref 0–0.7)
Eosinophils Relative: 1 %
HEMATOCRIT: 33.6 % — AB (ref 35.0–47.0)
HEMOGLOBIN: 10.7 g/dL — AB (ref 12.0–16.0)
LYMPHS PCT: 8 %
Lymphs Abs: 1.7 10*3/uL (ref 1.0–3.6)
MCH: 24 pg — ABNORMAL LOW (ref 26.0–34.0)
MCHC: 31.8 g/dL — ABNORMAL LOW (ref 32.0–36.0)
MCV: 75.3 fL — AB (ref 80.0–100.0)
MONO ABS: 0.6 10*3/uL (ref 0.2–0.9)
Monocytes Relative: 2 %
Neutro Abs: 20.4 10*3/uL — ABNORMAL HIGH (ref 1.4–6.5)
Neutrophils Relative %: 88 %
Platelets: 472 10*3/uL — ABNORMAL HIGH (ref 150–440)
RBC: 4.47 MIL/uL (ref 3.80–5.20)
RDW: 16.7 % — AB (ref 11.5–14.5)
WBC: 23.1 10*3/uL — ABNORMAL HIGH (ref 3.6–11.0)

## 2016-05-20 LAB — TROPONIN I: Troponin I: <0.03 ng/mL (ref <0.03)

## 2016-05-20 LAB — BRAIN NATRIURETIC PEPTIDE: B Natriuretic Peptide: 36 pg/mL (ref 0.0–100.0)

## 2016-05-20 NOTE — ED Notes (Signed)
Pt's O2 sats noted at 90% on Room Air, placed pt on 2L via [place]; pt's O2 sats improved to 99%

## 2016-05-20 NOTE — ED Triage Notes (Signed)
Pt states shob for "months" that has worsened since pneumonia diagnosis "the other day". Pt able to speak in full sentences, denies pain, but states has green sputum production cough. Pt appears in no acute distress. Pt states has felt dizzy and lightheaded, no known fever per pt.

## 2016-05-20 NOTE — ED Triage Notes (Signed)
Pt also has approx 2+ pedal edema.

## 2016-05-20 NOTE — ED Notes (Signed)
Notified by lab that there was not enough blood in the light green tube and patient needed to be redrawn.  Informed lab that patient is difficulty stick and requested that they come and redraw the patient.

## 2016-05-20 NOTE — ED Provider Notes (Signed)
[facility] Emergency Department Provider Note   ____________________________________________   First MD Initiated Contact with Patient 05/20/16 2333     (approximate)  I have reviewed the triage vital signs and the nursing notes.   HISTORY  Chief Complaint Shortness of Breath    HPI [name] [name] is a 61 y.o. female who presents to the ED from home with a chief complaint of shortness of breath. Patient has a history of pulmonary hypertension, COPD, CHF on 2.5 L chronic oxygen. Reports she has been short of breath for months, not improving. Saw her PCP last week and diagnosed with pneumonia. Placed on Levaquin which patient did not fill secondary to GI side effects. Reports cough productive of green sputum. Also reports worsening pedal edema; usually takes Lasix 40 mg once daily but sometimes takes it twice daily due to edema. Denies associated fever, chills, chest pain, abdominal pain, nausea, vomiting, diarrhea. Denies recent travel or trauma. Nothing made her symptoms better. Exertion makes her symptoms worse.   Past Medical History:  Diagnosis Date  . Allergic rhinitis   . Allergy   . Back pain   . COPD (chronic obstructive pulmonary disease) ([place])   . Degenerative joint disease of knee, left   . Depression   . Diabetes mellitus without complication ([place])   . Edema   . Elevated lipids   . GERD (gastroesophageal reflux disease)   . Hidradenitis   . History of colonic polyps   . Hypertension   . Hypothyroidism   . IBS (irritable bowel syndrome)   . Insomnia   . Obesity   . Oxygen dependent   . Pneumonia   . PVD (peripheral vascular disease) ([place])   . Sinusitis, chronic   . Sleep apnea   . Thyroid disease   . Vaginitis, atrophic   . Vertigo   . Vitamin D deficiency     Patient Active Problem List   Diagnosis Date Noted  . Pulmonary hypertension 12/14/2015  . Atypical chest pain 10/29/2015  . Acute respiratory failure ([place])  09/10/2015  . Pneumonia 09/08/2015  . Breast abscess 06/02/2015  . Abnormal mammogram of both breasts 02/25/2015  . Hidradenitis 12/30/2014  . Diabetes ([place]) 12/30/2014  . Essential hypertension 12/30/2014  . Asthma 12/30/2014    Past Surgical History:  Procedure Laterality Date  . ABDOMINAL HYSTERECTOMY    . AXILLARY HIDRADENITIS EXCISION    . CESAREAN SECTION     x 2  . CHOLECYSTECTOMY    . HEEL SPUR EXCISION N/A   . HYDRADENITIS EXCISION Right 12/31/2015   Procedure: EXCISION HIDRADENITIS AXILLA;  Surgeon: Clayburn Pert, MD;  Location: [facility] ORS;  Service: General;  Laterality: Right;  . TONSILLECTOMY      Prior to Admission medications   Medication Sig Start Date End Date Taking? Authorizing Provider  albuterol (PROVENTIL) (2.5 MG/3ML) 0.083% nebulizer solution Take 2.5 mg by nebulization every 6 (six) hours as needed for wheezing or shortness of breath.     Historical Provider, MD  aspirin EC 81 MG tablet Take 81 mg by mouth at bedtime.    Historical Provider, MD  atorvastatin (LIPITOR) 20 MG tablet Take 20 mg by mouth at bedtime.     Historical Provider, MD  budesonide-formoterol (SYMBICORT) 160-4.5 MCG/ACT inhaler Inhale 2 puffs into the lungs 2 (two) times daily.    Historical Provider, MD  doxepin (SINEQUAN) 50 MG capsule Take 50 mg by mouth at bedtime.    Historical Provider, MD  DULoxetine (CYMBALTA)  20 MG capsule Take 20 mg by mouth daily.    Historical Provider, MD  etodolac (LODINE) 400 MG tablet Take 400 mg by mouth 2 (two) times daily.    Historical Provider, MD  fluocinonide cream (LIDEX) 6.72 % Apply 1 application topically 2 (two) times daily.    Historical Provider, MD  furosemide (LASIX) 40 MG tablet Take 40 mg by mouth 2 (two) times daily.    Historical Provider, MD  gabapentin (NEURONTIN) 100 MG capsule Take 200 mg by mouth at bedtime.     Historical Provider, MD  glucose blood test strip TEST three times a day 12/30/15   Historical Provider, MD  HUMULIN R  U-500 KWIKPEN 500 UNIT/ML injection Inject 120 Units into the skin 2 (two) times daily with a meal.     Historical Provider, MD  ketoconazole (NIZORAL) 2 % shampoo Apply 1 application topically 2 (two) times a week.    Historical Provider, MD  Lancets Misc. (ACCU-CHEK FASTCLIX LANCET) KIT  12/27/15   Historical Provider, MD  levothyroxine (SYNTHROID, LEVOTHROID) 200 MCG tablet Take 200 mcg by mouth daily before breakfast.     Historical Provider, MD  lisinopril-hydrochlorothiazide (PRINZIDE,ZESTORETIC) 10-12.5 MG per tablet Take 1 tablet by mouth daily.    Historical Provider, MD  loratadine (CLARITIN) 10 MG tablet Take 10 mg by mouth daily.    Historical Provider, MD  meclizine (ANTIVERT) 25 MG tablet Take 25 mg by mouth 3 (three) times daily as needed for dizziness.     Historical Provider, MD  mometasone (NASONEX) 50 MCG/ACT nasal spray Place 2 sprays into both nostrils daily.     Historical Provider, MD  montelukast (SINGULAIR) 10 MG tablet Take 10 mg by mouth at bedtime.     Historical Provider, MD  olopatadine (PATANOL) 0.1 % ophthalmic solution Place 1 drop into both eyes 2 (two) times daily.    Historical Provider, MD  ondansetron (ZOFRAN) 8 MG tablet Take 4-8 mg by mouth every 8 (eight) hours as needed for nausea or vomiting.    Historical Provider, MD  pantoprazole (PROTONIX) 40 MG tablet Take 40 mg by mouth daily.    Historical Provider, MD  potassium chloride (K-DUR) 10 MEQ tablet Take 10 mEq by mouth daily.     Historical Provider, MD  TRADJENTA 5 MG TABS tablet Take 5 mg by mouth daily.     Historical Provider, MD  traZODone (DESYREL) 50 MG tablet Take 50-100 mg by mouth at bedtime as needed for sleep.     Historical Provider, MD    Allergies Codeine  Family History  Problem Relation Age of Onset  . Rashes / Skin problems Father   . Hypertension Father   . Breast cancer Paternal Grandmother     Social History Social History  Substance Use Topics  . Smoking status: Former  Smoker    Quit date: 12/23/1994  . Smokeless tobacco: Never Used  . Alcohol use No    Review of Systems  Constitutional: No fever/chills. Eyes: No visual changes. ENT: No sore throat. Cardiovascular: Denies chest pain. Respiratory: Positive for productive cough and shortness of breath. Gastrointestinal: No abdominal pain.  No nausea, no vomiting.  No diarrhea.  No constipation. Genitourinary: Negative for dysuria. Musculoskeletal: Negative for back pain. Skin: Negative for rash. Neurological: Negative for headaches, focal weakness or numbness.  10-point ROS otherwise negative.  ____________________________________________   PHYSICAL EXAM:  VITAL SIGNS: ED Triage Vitals [05/20/16 2140]  Enc Vitals Group     BP (!) 132/55  Pulse Rate 84     Resp 20     Temp 98.7 F (37.1 C)     Temp Source Oral     SpO2 94 %     Weight 290 lb (131.5 kg)     Height _0  (1.626 m)     Head Circumference      Peak Flow      Pain Score      Pain Loc      Pain Edu?      Excl. in [place]?     Constitutional: Alert and oriented. Well appearing and in no acute distress. Eyes: Conjunctivae are normal. PERRL. EOMI. Head: Atraumatic. Nose: No congestion/rhinnorhea. Mouth/Throat: Mucous membranes are moist.  Oropharynx non-erythematous. Neck: No stridor.   Cardiovascular: Normal rate, regular rhythm. Grossly normal heart sounds.  Good peripheral circulation. Respiratory: Normal respiratory effort.  No retractions. Lungs with mild wheezing bilaterally. Gastrointestinal: Obese. Soft and nontender. No distention. No abdominal bruits. No CVA tenderness. Musculoskeletal: No lower extremity tenderness. 1+ pitting BLE edema.  No joint effusions. Neurologic:  Normal speech and language. No gross focal neurologic deficits are appreciated.  Skin:  Skin is warm, dry and intact. No rash noted. Psychiatric: Mood and affect are normal. Speech and behavior are  normal.  ____________________________________________   LABS (all labs ordered are listed, but only abnormal results are displayed)  Labs Reviewed  CBC WITH DIFFERENTIAL/PLATELET - Abnormal; Notable for the following:       Result Value   WBC 23.1 (*)    Hemoglobin 10.7 (*)    HCT 33.6 (*)    MCV 75.3 (*)    MCH 24.0 (*)    MCHC 31.8 (*)    RDW 16.7 (*)    Platelets 472 (*)    Neutro Abs 20.4 (*)    All other components within normal limits  COMPREHENSIVE METABOLIC PANEL - Abnormal; Notable for the following:    Potassium 3.3 (*)    Chloride 95 (*)    CO2 34 (*)    ALT 10 (*)    All other components within normal limits  TROPONIN I  BRAIN NATRIURETIC PEPTIDE   ____________________________________________  EKG  ED ECG REPORT I, Tais Koestner J, the attending physician, personally viewed and interpreted this ECG.   Date: 05/21/2016  EKG Time: 2144  Rate: 85  Rhythm: normal EKG, normal sinus rhythm  Axis: Normal  Intervals:none  ST&T Change: Nonspecific  ____________________________________________  RADIOLOGY  Chest 2 view interpreted per Dr. Jimmye Norman: Mild interstitial prominence without overt edema. The findings could  represent pulmonary venous congestion in the appropriate clinical  setting. Mild atelectasis in the bases without suspicious  infiltrate.   CT chest without contrast interpreted per Dr. Marisue Humble: 1. No evidence of pneumonia. Heterogeneous parenchymal attenuation  can be seen in the setting of emphysema, small airways disease or  air trapping.  2. Aortic atherosclerosis without aneurysm. Cardiomegaly. Coronary  artery calcifications.  3. [place] upper mediastinal lymph nodes are likely reactive.  4. Hepatomegaly and hepatic steatosis incidentally noted in the  upper abdomen.   ____________________________________________   PROCEDURES  Procedure(s) performed: None  Procedures  Critical Care performed:  No  ____________________________________________   INITIAL IMPRESSION / ASSESSMENT AND PLAN / ED COURSE  Pertinent labs & imaging results that were available during my care of the patient were reviewed by me and considered in my medical decision making (see chart for details).  61 year old female with COPD and CHF on chronic oxygen therapy who presents  with shortness of breath for months, worsening this week. Laboratory results notable for leukocytosis. Review of records demonstrate patient had WBC of 24 months ago. Will administer DuoNeb and Solu-Medrol for wheezing; Lasix for edema and reassess.  Clinical Course as of May 22 307  Sun May 21, 2016  0308 Wheezing improved. Patient becomes very tachypneic ambulating the short distance to the commode. Will discuss with hospitalist evaluate patient in the emergency department for admission.  [JS]    Clinical Course User Index [JS] Paulette Blanch, MD     ____________________________________________   FINAL CLINICAL IMPRESSION(S) / ED DIAGNOSES  Final diagnoses:  COPD exacerbation ([place])  Dyspnea on exertion  Acute on chronic congestive heart failure, unspecified congestive heart failure type ([place])      NEW MEDICATIONS STARTED DURING THIS VISIT:  New Prescriptions   No medications on file     Note:  This document was prepared using Dragon voice recognition software and may include unintentional dictation errors.    Paulette Blanch, MD 05/21/16 [phone]

## 2016-05-21 ENCOUNTER — Inpatient Hospital Stay
Admit: 2016-05-21 | Discharge: 2016-05-21 | Disposition: A | Discharge status: Home / Self Care | Payer: Medicaid Other | Attending: Internal Medicine | Admitting: Internal Medicine

## 2016-05-21 ENCOUNTER — Emergency Department: Payer: Medicaid Other

## 2016-05-21 ENCOUNTER — Encounter: Payer: Self-pay | Admitting: Internal Medicine

## 2016-05-21 DIAGNOSIS — Z8249 Family history of ischemic heart disease and other diseases of the circulatory system: Secondary | ICD-10-CM | POA: Diagnosis not present

## 2016-05-21 DIAGNOSIS — J9621 Acute and chronic respiratory failure with hypoxia: Secondary | ICD-10-CM | POA: Diagnosis present

## 2016-05-21 DIAGNOSIS — K219 Gastro-esophageal reflux disease without esophagitis: Secondary | ICD-10-CM | POA: Diagnosis present

## 2016-05-21 DIAGNOSIS — E785 Hyperlipidemia, unspecified: Secondary | ICD-10-CM | POA: Diagnosis present

## 2016-05-21 DIAGNOSIS — J440 Chronic obstructive pulmonary disease with acute lower respiratory infection: Secondary | ICD-10-CM | POA: Diagnosis present

## 2016-05-21 DIAGNOSIS — E1151 Type 2 diabetes mellitus with diabetic peripheral angiopathy without gangrene: Secondary | ICD-10-CM | POA: Diagnosis present

## 2016-05-21 DIAGNOSIS — Z803 Family history of malignant neoplasm of breast: Secondary | ICD-10-CM | POA: Diagnosis not present

## 2016-05-21 DIAGNOSIS — E876 Hypokalemia: Secondary | ICD-10-CM | POA: Diagnosis present

## 2016-05-21 DIAGNOSIS — I110 Hypertensive heart disease with heart failure: Secondary | ICD-10-CM | POA: Diagnosis present

## 2016-05-21 DIAGNOSIS — L03116 Cellulitis of left lower limb: Secondary | ICD-10-CM | POA: Diagnosis present

## 2016-05-21 DIAGNOSIS — Z7982 Long term (current) use of aspirin: Secondary | ICD-10-CM | POA: Diagnosis not present

## 2016-05-21 DIAGNOSIS — I5033 Acute on chronic diastolic (congestive) heart failure: Secondary | ICD-10-CM | POA: Diagnosis present

## 2016-05-21 DIAGNOSIS — R0602 Shortness of breath: Secondary | ICD-10-CM | POA: Diagnosis present

## 2016-05-21 DIAGNOSIS — E039 Hypothyroidism, unspecified: Secondary | ICD-10-CM | POA: Diagnosis present

## 2016-05-21 DIAGNOSIS — Z885 Allergy status to narcotic agent status: Secondary | ICD-10-CM | POA: Diagnosis not present

## 2016-05-21 DIAGNOSIS — J441 Chronic obstructive pulmonary disease with (acute) exacerbation: Secondary | ICD-10-CM | POA: Diagnosis present

## 2016-05-21 DIAGNOSIS — Z9981 Dependence on supplemental oxygen: Secondary | ICD-10-CM | POA: Diagnosis not present

## 2016-05-21 DIAGNOSIS — I272 Pulmonary hypertension, unspecified: Secondary | ICD-10-CM | POA: Diagnosis present

## 2016-05-21 DIAGNOSIS — Z87891 Personal history of nicotine dependence: Secondary | ICD-10-CM | POA: Diagnosis not present

## 2016-05-21 DIAGNOSIS — Z794 Long term (current) use of insulin: Secondary | ICD-10-CM | POA: Diagnosis not present

## 2016-05-21 DIAGNOSIS — Z825 Family history of asthma and other chronic lower respiratory diseases: Secondary | ICD-10-CM | POA: Diagnosis not present

## 2016-05-21 DIAGNOSIS — Z79899 Other long term (current) drug therapy: Secondary | ICD-10-CM | POA: Diagnosis not present

## 2016-05-21 DIAGNOSIS — L732 Hidradenitis suppurativa: Secondary | ICD-10-CM | POA: Diagnosis present

## 2016-05-21 DIAGNOSIS — Z7951 Long term (current) use of inhaled steroids: Secondary | ICD-10-CM | POA: Diagnosis not present

## 2016-05-21 DIAGNOSIS — L03115 Cellulitis of right lower limb: Secondary | ICD-10-CM | POA: Diagnosis present

## 2016-05-21 LAB — GLUCOSE, CAPILLARY
GLUCOSE-CAPILLARY: 417 mg/dL — AB (ref 65–99)
GLUCOSE-CAPILLARY: 462 mg/dL — AB (ref 65–99)
GLUCOSE-CAPILLARY: 401 mg/dL — AB (ref 65–99)
GLUCOSE-CAPILLARY: 360 mg/dL — AB (ref 65–99)
Glucose-Capillary: 414 mg/dL — ABNORMAL HIGH (ref 65–99)

## 2016-05-21 LAB — ECHOCARDIOGRAM COMPLETE
Height: 64 in
Weight: 4203.2 oz

## 2016-05-21 LAB — COMPREHENSIVE METABOLIC PANEL
ALBUMIN: 3.7 g/dL (ref 3.5–5.0)
ALT: 10 U/L — ABNORMAL LOW (ref 14–54)
AST: 17 U/L (ref 15–41)
Alkaline Phosphatase: 99 U/L (ref 38–126)
Anion gap: 9 (ref 5–15)
BILIRUBIN TOTAL: 0.6 mg/dL (ref 0.3–1.2)
BUN: 14 mg/dL (ref 6–20)
CO2: 34 mmol/L — ABNORMAL HIGH (ref 22–32)
Calcium: 9.4 mg/dL (ref 8.9–10.3)
Chloride: 95 mmol/L — ABNORMAL LOW (ref 101–111)
Creatinine, Ser: 0.84 mg/dL (ref 0.44–1.00)
GFR calc Af Amer: >60 mL/min (ref >60)
GFR calc non Af Amer: >60 mL/min (ref >60)
GLUCOSE: 94 mg/dL (ref 65–99)
POTASSIUM: 3.3 mmol/L — AB (ref 3.5–5.1)
SODIUM: 138 mmol/L (ref 135–145)
TOTAL PROTEIN: 8 g/dL (ref 6.5–8.1)

## 2016-05-21 LAB — IRON AND TIBC
IRON: 28 ug/dL (ref 28–170)
Saturation Ratios: 9 % — ABNORMAL LOW (ref 10.4–31.8)
TIBC: 317 ug/dL (ref 250–450)
UIBC: 289 ug/dL

## 2016-05-21 LAB — OCCULT BLOOD X 1 CARD TO LAB, STOOL: FECAL OCCULT BLD: NEGATIVE

## 2016-05-21 LAB — TROPONIN I
Troponin I: <0.03 ng/mL (ref <0.03)
Troponin I: <0.03 ng/mL (ref <0.03)
Troponin I: <0.03 ng/mL (ref <0.03)

## 2016-05-21 LAB — FERRITIN: FERRITIN: 59 ng/mL (ref 11–307)

## 2016-05-21 MED ORDER — ONDANSETRON HCL 4 MG/2ML IJ SOLN: 4.0000 mg | Freq: Four times a day (QID) | INTRAMUSCULAR | Status: DC | PRN

## 2016-05-21 MED ORDER — OLOPATADINE HCL 0.1 % OP SOLN
1.0000 [drp] | Freq: Four times a day (QID) | OPHTHALMIC | Status: DC | PRN
  Administered 2016-05-21 – 2016-05-24 (×6): 1 [drp] via OPHTHALMIC
  Filled 2016-05-21: qty 5

## 2016-05-21 MED ORDER — ENOXAPARIN SODIUM 40 MG/0.4ML ~~LOC~~ SOLN
40.0000 mg | SUBCUTANEOUS | Status: DC
  Administered 2016-05-21 – 2016-05-22 (×2): 40 mg via SUBCUTANEOUS
  Filled 2016-05-21 (×2): qty 0.4

## 2016-05-21 MED ORDER — IPRATROPIUM-ALBUTEROL 0.5-2.5 (3) MG/3ML IN SOLN
3.0000 mL | Freq: Four times a day (QID) | RESPIRATORY_TRACT | Status: DC
  Administered 2016-05-21 (×2): 3 mL via RESPIRATORY_TRACT
  Filled 2016-05-21 (×2): qty 3

## 2016-05-21 MED ORDER — METHYLPREDNISOLONE SODIUM SUCC 125 MG IJ SOLR: 60.0000 mg | Freq: Every day | INTRAMUSCULAR | Status: DC

## 2016-05-21 MED ORDER — DULOXETINE HCL 20 MG PO CPEP
20.0000 mg | ORAL_CAPSULE | Freq: Every day | ORAL | Status: DC
  Administered 2016-05-21 – 2016-05-24 (×4): 20 mg via ORAL
  Filled 2016-05-21 (×4): qty 1

## 2016-05-21 MED ORDER — POTASSIUM CHLORIDE CRYS ER 10 MEQ PO TBCR
10.0000 meq | EXTENDED_RELEASE_TABLET | Freq: Every day | ORAL | Status: DC
  Administered 2016-05-21 – 2016-05-24 (×4): 10 meq via ORAL
  Filled 2016-05-21 (×7): qty 1

## 2016-05-21 MED ORDER — FUROSEMIDE 10 MG/ML IJ SOLN
40.0000 mg | Freq: Once | INTRAMUSCULAR | Status: AC
  Administered 2016-05-21: 40 mg via INTRAVENOUS
  Filled 2016-05-21: qty 4

## 2016-05-21 MED ORDER — FUROSEMIDE 10 MG/ML IJ SOLN: 40.0000 mg | Freq: Two times a day (BID) | INTRAMUSCULAR | Status: DC

## 2016-05-21 MED ORDER — METHYLPREDNISOLONE SODIUM SUCC 125 MG IJ SOLR: 125.0000 mg | Freq: Four times a day (QID) | INTRAMUSCULAR | Status: DC

## 2016-05-21 MED ORDER — AZITHROMYCIN 250 MG PO TABS
500.0000 mg | ORAL_TABLET | Freq: Every day | ORAL | Status: AC
Start: 2016-05-21 — End: 2016-05-21
  Administered 2016-05-21: 500 mg via ORAL
  Filled 2016-05-21: qty 2

## 2016-05-21 MED ORDER — AZITHROMYCIN 250 MG PO TABS
250.0000 mg | ORAL_TABLET | Freq: Every day | ORAL | Status: DC
  Administered 2016-05-22 – 2016-05-24 (×3): 250 mg via ORAL
  Filled 2016-05-21 (×3): qty 1

## 2016-05-21 MED ORDER — CEFTRIAXONE SODIUM-DEXTROSE 2-2.22 GM-% IV SOLR
2.0000 g | INTRAVENOUS | Status: DC
  Administered 2016-05-21: 2 g via INTRAVENOUS
  Filled 2016-05-21 (×2): qty 50

## 2016-05-21 MED ORDER — IPRATROPIUM-ALBUTEROL 0.5-2.5 (3) MG/3ML IN SOLN
3.0000 mL | RESPIRATORY_TRACT | Status: DC
  Administered 2016-05-21 – 2016-05-24 (×13): 3 mL via RESPIRATORY_TRACT
  Filled 2016-05-21 (×18): qty 3

## 2016-05-21 MED ORDER — LEVOTHYROXINE SODIUM 100 MCG PO TABS
200.0000 ug | ORAL_TABLET | Freq: Every day | ORAL | Status: DC
  Administered 2016-05-21 – 2016-05-24 (×4): 200 ug via ORAL
  Filled 2016-05-21 (×4): qty 2

## 2016-05-21 MED ORDER — DOXEPIN HCL 50 MG PO CAPS
50.0000 mg | ORAL_CAPSULE | Freq: Every day | ORAL | Status: DC
  Administered 2016-05-21 – 2016-05-23 (×3): 50 mg via ORAL
  Filled 2016-05-21 (×4): qty 1

## 2016-05-21 MED ORDER — IPRATROPIUM-ALBUTEROL 0.5-2.5 (3) MG/3ML IN SOLN
3.0000 mL | Freq: Once | RESPIRATORY_TRACT | Status: AC
  Administered 2016-05-21: 3 mL via RESPIRATORY_TRACT
  Filled 2016-05-21: qty 3

## 2016-05-21 MED ORDER — ASPIRIN EC 81 MG PO TBEC
81.0000 mg | DELAYED_RELEASE_TABLET | Freq: Every day | ORAL | Status: DC
  Administered 2016-05-21 – 2016-05-23 (×3): 81 mg via ORAL
  Filled 2016-05-21 (×3): qty 1

## 2016-05-21 MED ORDER — OLOPATADINE HCL 0.1 % OP SOLN
1.0000 [drp] | Freq: Two times a day (BID) | OPHTHALMIC | Status: DC
Start: 2016-05-21 — End: 2016-05-21
  Administered 2016-05-21: 1 [drp] via OPHTHALMIC
  Filled 2016-05-21: qty 5

## 2016-05-21 MED ORDER — ONDANSETRON HCL 4 MG PO TABS: 4.0000 mg | ORAL_TABLET | Freq: Four times a day (QID) | ORAL | Status: DC | PRN

## 2016-05-21 MED ORDER — ACETAMINOPHEN 650 MG RE SUPP: 650.0000 mg | Freq: Four times a day (QID) | RECTAL | Status: DC | PRN

## 2016-05-21 MED ORDER — INSULIN REGULAR HUMAN (CONC) 500 UNIT/ML ~~LOC~~ SOPN
135.0000 [IU] | PEN_INJECTOR | Freq: Once | SUBCUTANEOUS | Status: AC
  Administered 2016-05-21: 135 [IU] via SUBCUTANEOUS
  Filled 2016-05-21: qty 3

## 2016-05-21 MED ORDER — FLUTICASONE PROPIONATE 50 MCG/ACT NA SUSP
1.0000 | Freq: Every day | NASAL | Status: DC
  Administered 2016-05-21 – 2016-05-23 (×2): 1 via NASAL
  Filled 2016-05-21: qty 16

## 2016-05-21 MED ORDER — LINAGLIPTIN 5 MG PO TABS
5.0000 mg | ORAL_TABLET | Freq: Every day | ORAL | Status: DC
  Administered 2016-05-21 – 2016-05-24 (×4): 5 mg via ORAL
  Filled 2016-05-21 (×4): qty 1

## 2016-05-21 MED ORDER — MONTELUKAST SODIUM 10 MG PO TABS
10.0000 mg | ORAL_TABLET | Freq: Every day | ORAL | Status: DC
  Administered 2016-05-21 – 2016-05-23 (×3): 10 mg via ORAL
  Filled 2016-05-21 (×3): qty 1

## 2016-05-21 MED ORDER — OLOPATADINE HCL 0.1 % OP SOLN
1.0000 [drp] | Freq: Two times a day (BID) | OPHTHALMIC | Status: DC
  Administered 2016-05-21: 1 [drp] via OPHTHALMIC
  Filled 2016-05-21: qty 5

## 2016-05-21 MED ORDER — METHYLPREDNISOLONE SODIUM SUCC 125 MG IJ SOLR
125.0000 mg | Freq: Once | INTRAMUSCULAR | Status: AC
  Administered 2016-05-21: 125 mg via INTRAVENOUS
  Filled 2016-05-21: qty 2

## 2016-05-21 MED ORDER — INSULIN REGULAR HUMAN (CONC) 500 UNIT/ML ~~LOC~~ SOLN
135.0000 [IU] | Freq: Once | SUBCUTANEOUS | Status: DC
  Filled 2016-05-21: qty 20

## 2016-05-21 MED ORDER — SODIUM CHLORIDE 0.9% FLUSH: 3.0000 mL | Freq: Two times a day (BID) | INTRAVENOUS | Status: DC

## 2016-05-21 MED ORDER — DEXTROSE 5 % IV SOLN
500.0000 mg | INTRAVENOUS | Status: DC
  Administered 2016-05-21: 500 mg via INTRAVENOUS

## 2016-05-21 MED ORDER — PANTOPRAZOLE SODIUM 40 MG PO TBEC
40.0000 mg | DELAYED_RELEASE_TABLET | Freq: Every day | ORAL | Status: DC
  Administered 2016-05-21 – 2016-05-24 (×4): 40 mg via ORAL
  Filled 2016-05-21 (×4): qty 1

## 2016-05-21 MED ORDER — INSULIN ASPART 100 UNIT/ML ~~LOC~~ SOLN
0.0000 [IU] | Freq: Every day | SUBCUTANEOUS | Status: DC
  Administered 2016-05-21: 5 [IU] via SUBCUTANEOUS
  Administered 2016-05-22: 4 [IU] via SUBCUTANEOUS
  Administered 2016-05-23: 5 [IU] via SUBCUTANEOUS
  Filled 2016-05-21: qty 4
  Filled 2016-05-21 (×2): qty 5

## 2016-05-21 MED ORDER — INSULIN REGULAR HUMAN (CONC) 500 UNIT/ML ~~LOC~~ SOPN
120.0000 [IU] | PEN_INJECTOR | Freq: Two times a day (BID) | SUBCUTANEOUS | Status: DC
  Administered 2016-05-21 – 2016-05-24 (×5): 120 [IU] via SUBCUTANEOUS
  Filled 2016-05-21: qty 3

## 2016-05-21 MED ORDER — TRAZODONE HCL 50 MG PO TABS
50.0000 mg | ORAL_TABLET | Freq: Every evening | ORAL | Status: DC | PRN
  Administered 2016-05-21 – 2016-05-23 (×3): 100 mg via ORAL
  Filled 2016-05-21 (×3): qty 2

## 2016-05-21 MED ORDER — SODIUM CHLORIDE 0.9% FLUSH
3.0000 mL | Freq: Two times a day (BID) | INTRAVENOUS | Status: DC
  Administered 2016-05-21 – 2016-05-24 (×7): 3 mL via INTRAVENOUS

## 2016-05-21 MED ORDER — INSULIN ASPART 100 UNIT/ML ~~LOC~~ SOLN
10.0000 [IU] | Freq: Once | SUBCUTANEOUS | Status: AC
Start: 2016-05-21 — End: 2016-05-21
  Administered 2016-05-21: 10 [IU] via SUBCUTANEOUS
  Filled 2016-05-21: qty 10

## 2016-05-21 MED ORDER — ATORVASTATIN CALCIUM 20 MG PO TABS
20.0000 mg | ORAL_TABLET | Freq: Every day | ORAL | Status: DC
  Administered 2016-05-21 – 2016-05-23 (×3): 20 mg via ORAL
  Filled 2016-05-21 (×3): qty 1

## 2016-05-21 MED ORDER — SODIUM CHLORIDE 0.9% FLUSH
3.0000 mL | INTRAVENOUS | Status: DC | PRN
Start: 2016-05-21 — End: 2016-05-24

## 2016-05-21 MED ORDER — LORATADINE 10 MG PO TABS
10.0000 mg | ORAL_TABLET | Freq: Every day | ORAL | Status: DC
  Administered 2016-05-21 – 2016-05-24 (×4): 10 mg via ORAL
  Filled 2016-05-21 (×4): qty 1

## 2016-05-21 MED ORDER — PNEUMOCOCCAL VAC POLYVALENT 25 MCG/0.5ML IJ INJ
0.5000 mL | INJECTION | INTRAMUSCULAR | Status: AC
  Administered 2016-05-24: 0.5 mL via INTRAMUSCULAR
  Filled 2016-05-21: qty 0.5

## 2016-05-21 MED ORDER — SODIUM CHLORIDE 0.9 % IV SOLN: 250.0000 mL | INTRAVENOUS | Status: DC | PRN

## 2016-05-21 MED ORDER — INSULIN ASPART 100 UNIT/ML ~~LOC~~ SOLN
0.0000 [IU] | Freq: Three times a day (TID) | SUBCUTANEOUS | Status: DC
  Administered 2016-05-21: 20 [IU] via SUBCUTANEOUS
  Administered 2016-05-22: 7 [IU] via SUBCUTANEOUS
  Administered 2016-05-22: 3 [IU] via SUBCUTANEOUS
  Administered 2016-05-23: 7 [IU] via SUBCUTANEOUS
  Administered 2016-05-23 – 2016-05-24 (×3): 20 [IU] via SUBCUTANEOUS
  Administered 2016-05-24: 11 [IU] via SUBCUTANEOUS
  Filled 2016-05-21: qty 7
  Filled 2016-05-21: qty 20
  Filled 2016-05-21: qty 3
  Filled 2016-05-21: qty 20
  Filled 2016-05-21: qty 7
  Filled 2016-05-21: qty 11
  Filled 2016-05-21 (×2): qty 20

## 2016-05-21 MED ORDER — ETODOLAC 400 MG PO TABS
400.0000 mg | ORAL_TABLET | Freq: Two times a day (BID) | ORAL | Status: DC
  Filled 2016-05-21: qty 1

## 2016-05-21 MED ORDER — GABAPENTIN 100 MG PO CAPS
200.0000 mg | ORAL_CAPSULE | Freq: Every day | ORAL | Status: DC
  Administered 2016-05-21 – 2016-05-23 (×3): 200 mg via ORAL
  Filled 2016-05-21 (×3): qty 2

## 2016-05-21 MED ORDER — ACETAMINOPHEN 325 MG PO TABS: 650.0000 mg | ORAL_TABLET | Freq: Four times a day (QID) | ORAL | Status: DC | PRN

## 2016-05-21 MED ORDER — AZITHROMYCIN 500 MG IV SOLR
INTRAVENOUS | Status: AC
  Administered 2016-05-21: 500 mg via INTRAVENOUS
  Filled 2016-05-21: qty 500

## 2016-05-21 MED ORDER — SENNOSIDES-DOCUSATE SODIUM 8.6-50 MG PO TABS: 1.0000 | ORAL_TABLET | Freq: Every evening | ORAL | Status: DC | PRN

## 2016-05-21 MED ORDER — ETODOLAC 200 MG PO CAPS
400.0000 mg | ORAL_CAPSULE | Freq: Two times a day (BID) | ORAL | Status: DC
  Administered 2016-05-21 – 2016-05-24 (×7): 400 mg via ORAL
  Filled 2016-05-21 (×7): qty 2

## 2016-05-21 MED ORDER — POTASSIUM CHLORIDE CRYS ER 20 MEQ PO TBCR
40.0000 meq | EXTENDED_RELEASE_TABLET | Freq: Once | ORAL | Status: AC
  Administered 2016-05-21: 40 meq via ORAL
  Filled 2016-05-21: qty 2

## 2016-05-21 NOTE — ED Notes (Signed)
Hospitalist at bedside 

## 2016-05-21 NOTE — ED Notes (Signed)
Pt transported to room 236

## 2016-05-21 NOTE — Progress Notes (Signed)
Recheck CBG still greater than 400. Dr. Ether Griffins notified. Order for one time 10 units novolog and recheck before dinner.

## 2016-05-21 NOTE — Progress Notes (Signed)
CBG 401. Dr. Ether Griffins notified. Order to give 135 units via currently ordered insulin pen instead of the 120 units ordered for now and sliding scale. Spoke with Lenna Sciara PharmD for assistance with order placement. Will recheck CBG in 2 hours to make sure patient has not dropped too low.

## 2016-05-21 NOTE — Progress Notes (Signed)
Patient requested eye drops (olopatadine). Called Dr. Ara Kussmaul for order. New order received and implemented.

## 2016-05-21 NOTE — Progress Notes (Signed)
Dr. Ether Griffins notified of patient's elevated blood sugar. Home order for insulin pen to be given. MD to review and place orders as needed.

## 2016-05-21 NOTE — H&P (Signed)
[place] [facility] [place] NAME: [name] [name]    MR#:  [id number]  DATE OF BIRTH:  [birth year]  DATE OF ADMISSION:  05/20/2016  PRIMARY CARE PHYSICIAN: [name], MD   REQUESTING/REFERRING PHYSICIAN:   CHIEF COMPLAINT:   Chief Complaint  Patient presents with  . Shortness of Breath    HISTORY OF PRESENT ILLNESS: [name] [name]  is a 61 y.o. female with a known history of COPD on home oxygen, degenerative joint disease left knee, diabetes mellitus type 2, hyperlipidemia, GERD, hydradenitis, hypertension, hypothyroidism, irritable bowel syndrome presented to the emergency room with difficulty breathing for the last couple of weeks. Patient felt more short of breath for the last few weeks and she also noticed some wheezing in both the lungs. She also has noticed some increased swelling in both the lower extremities. Patient is a she does not have any history of heart failure but aches oral Lasix for diuresis. Her WBC count is elevated she is not on oral steroids and it's been elevated for the last 4 months. No complaints of any cough, fever and chills. She was given breathing treatment in the emergency room, Solu-Medrol and Lasix for diuresis. Chest x-ray showed emphysema but no evidence of any pneumonia. No recent travel or sick contacts at home. Hospitalist service was consulted for further care of the patient. Patient quit smoking 24 years ago.  PAST MEDICAL HISTORY:   Past Medical History:  Diagnosis Date  . Allergic rhinitis   . Allergy   . Back pain   . COPD (chronic obstructive pulmonary disease) ([place])   . Degenerative joint disease of knee, left   . Depression   . Diabetes mellitus without complication ([place])   . Edema   . Elevated lipids   . GERD (gastroesophageal reflux disease)   . Hidradenitis   . History of colonic polyps   . Hypertension   . Hypothyroidism   . IBS (irritable bowel syndrome)   . Insomnia   . Obesity   . Oxygen  dependent   . Pneumonia   . PVD (peripheral vascular disease) ([place])   . Sinusitis, chronic   . Sleep apnea   . Thyroid disease   . Vaginitis, atrophic   . Vertigo   . Vitamin D deficiency     PAST SURGICAL HISTORY: Past Surgical History:  Procedure Laterality Date  . ABDOMINAL HYSTERECTOMY    . AXILLARY HIDRADENITIS EXCISION    . CESAREAN SECTION     x 2  . CHOLECYSTECTOMY    . HEEL SPUR EXCISION N/A   . HYDRADENITIS EXCISION Right 12/31/2015   Procedure: EXCISION HIDRADENITIS AXILLA;  Surgeon: Clayburn Pert, MD;  Location: [facility] ORS;  Service: General;  Laterality: Right;  . TONSILLECTOMY      SOCIAL HISTORY:  Social History  Substance Use Topics  . Smoking status: Former Smoker    Quit date: 12/23/1994  . Smokeless tobacco: Never Used  . Alcohol use No    FAMILY HISTORY:  Family History  Problem Relation Age of Onset  . Rashes / Skin problems Father   . Hypertension Father   . Heart disease Father   . Breast cancer Paternal Grandmother   . COPD Mother     DRUG ALLERGIES:  Allergies  Allergen Reactions  . Codeine Itching    REVIEW OF SYSTEMS:   CONSTITUTIONAL: No fever, has weakness.  EYES: No blurred or double vision.  EARS, NOSE, AND THROAT: No tinnitus or ear  pain.  RESPIRATORY: No cough,has shortness of breath, wheezing  No hemoptysis.  CARDIOVASCULAR: No chest pain, orthopnea, Has edema legs GASTROINTESTINAL: No nausea, vomiting, diarrhea or abdominal pain.  GENITOURINARY: No dysuria, hematuria.  ENDOCRINE: No polyuria, nocturia,  HEMATOLOGY: No anemia, easy bruising or bleeding SKIN: hidradenitis noted axilla area.. MUSCULOSKELETAL: No joint pain or arthritis.   NEUROLOGIC: No tingling, numbness, weakness.  PSYCHIATRY: No anxiety or depression.   MEDICATIONS AT HOME:  Prior to Admission medications   Medication Sig Start Date End Date Taking? Authorizing Provider  albuterol (PROVENTIL) (2.5 MG/3ML) 0.083% nebulizer solution Take 2.5 mg by  nebulization every 6 (six) hours as needed for wheezing or shortness of breath.    Yes Historical Provider, MD  aspirin EC 81 MG tablet Take 81 mg by mouth at bedtime.   Yes Historical Provider, MD  atorvastatin (LIPITOR) 20 MG tablet Take 20 mg by mouth at bedtime.    Yes Historical Provider, MD  budesonide-formoterol (SYMBICORT) 160-4.5 MCG/ACT inhaler Inhale 2 puffs into the lungs 2 (two) times daily.   Yes Historical Provider, MD  doxepin (SINEQUAN) 50 MG capsule Take 50 mg by mouth at bedtime.   Yes Historical Provider, MD  DULoxetine (CYMBALTA) 20 MG capsule Take 20 mg by mouth daily.   Yes Historical Provider, MD  etodolac (LODINE) 400 MG tablet Take 400 mg by mouth 2 (two) times daily.   Yes Historical Provider, MD  furosemide (LASIX) 40 MG tablet Take 40 mg by mouth 2 (two) times daily.   Yes Historical Provider, MD  gabapentin (NEURONTIN) 100 MG capsule Take 200 mg by mouth at bedtime.    Yes Historical Provider, MD  glucose blood test strip TEST three times a day 12/30/15  Yes Historical Provider, MD  HUMULIN R U-500 KWIKPEN 500 UNIT/ML injection Inject 120 Units into the skin 2 (two) times daily with a meal.    Yes Historical Provider, MD  ketoconazole (NIZORAL) 2 % shampoo Apply 1 application topically 2 (two) times a week.   Yes Historical Provider, MD  Lancets Misc. (ACCU-CHEK FASTCLIX LANCET) KIT  12/27/15  Yes Historical Provider, MD  levothyroxine (SYNTHROID, LEVOTHROID) 200 MCG tablet Take 200 mcg by mouth daily before breakfast.    Yes Historical Provider, MD  lisinopril-hydrochlorothiazide (PRINZIDE,ZESTORETIC) 10-12.5 MG per tablet Take 1 tablet by mouth daily.   Yes Historical Provider, MD  loratadine (CLARITIN) 10 MG tablet Take 10 mg by mouth daily.   Yes Historical Provider, MD  meclizine (ANTIVERT) 25 MG tablet Take 25 mg by mouth 3 (three) times daily as needed for dizziness.    Yes Historical Provider, MD  mometasone (NASONEX) 50 MCG/ACT nasal spray Place 2 sprays into  both nostrils daily.    Yes Historical Provider, MD  montelukast (SINGULAIR) 10 MG tablet Take 10 mg by mouth at bedtime.    Yes Historical Provider, MD  olopatadine (PATANOL) 0.1 % ophthalmic solution Place 1 drop into both eyes 2 (two) times daily.   Yes Historical Provider, MD  ondansetron (ZOFRAN) 8 MG tablet Take 4-8 mg by mouth every 8 (eight) hours as needed for nausea or vomiting.   Yes Historical Provider, MD  pantoprazole (PROTONIX) 40 MG tablet Take 40 mg by mouth daily.   Yes Historical Provider, MD  potassium chloride (K-DUR) 10 MEQ tablet Take 10 mEq by mouth daily.    Yes Historical Provider, MD  TRADJENTA 5 MG TABS tablet Take 5 mg by mouth daily.    Yes Historical Provider, MD  traZODone (  DESYREL) 50 MG tablet Take 50-100 mg by mouth at bedtime as needed for sleep.    Yes Historical Provider, MD      PHYSICAL EXAMINATION:   VITAL SIGNS: Blood pressure (!) 115/57, pulse 87, temperature 98.7 F (37.1 C), temperature source Oral, resp. rate 17, height 5' 4" (1.626 m), weight 131.5 kg (290 lb), SpO2 96 %.  GENERAL:  61 y.o.-year-old patient lying in the bed with no acute distress.  EYES: Pupils equal, round, reactive to light and accommodation. No scleral icterus. Extraocular muscles intact.  HEENT: Head atraumatic, normocephalic. Oropharynx and nasopharynx clear.  NECK:  Supple, no jugular venous distention. No thyroid enlargement, no tenderness.  LUNGS: Decreased breath sounds bilaterally, bilateral wheezing noted. No use of accessory muscles of respiration.  CARDIOVASCULAR: S1, S2 normal. No murmurs, rubs, or gallops.  ABDOMEN: Soft, nontender, nondistended. Bowel sounds present. No organomegaly or mass.  EXTREMITIES: Has pedal edema, no cyanosis, or clubbing.  NEUROLOGIC: Cranial nerves II through XII are intact. Muscle strength 5/5 in all extremities. Sensation intact. Gait not checked.  PSYCHIATRIC: The patient is alert and oriented x 3.  SKIN: hidradenitis noted.    LABORATORY PANEL:   CBC  Recent Labs Lab 05/20/16 2155  WBC 23.1*  HGB 10.7*  HCT 33.6*  PLT 472*  MCV 75.3*  MCH 24.0*  MCHC 31.8*  RDW 16.7*  LYMPHSABS 1.7  MONOABS 0.6  EOSABS 0.3  BASOSABS 0.1   ------------------------------------------------------------------------------------------------------------------  Chemistries   Recent Labs Lab 05/21/16 0002  NA 138  K 3.3*  CL 95*  CO2 34*  GLUCOSE 94  BUN 14  CREATININE 0.84  CALCIUM 9.4  AST 17  ALT 10*  ALKPHOS 99  BILITOT 0.6   ------------------------------------------------------------------------------------------------------------------ estimated creatinine clearance is 97.2 mL/min (by C-G formula based on SCr of 0.84 mg/dL). ------------------------------------------------------------------------------------------------------------------ No results for input(s): TSH, T4TOTAL, T3FREE, THYROIDAB in the last 72 hours.  Invalid input(s): FREET3   Coagulation profile No results for input(s): INR, PROTIME in the last 168 hours. ------------------------------------------------------------------------------------------------------------------- No results for input(s): DDIMER in the last 72 hours. -------------------------------------------------------------------------------------------------------------------  Cardiac Enzymes  Recent Labs Lab 05/20/16 2155  TROPONINI <0.03   ------------------------------------------------------------------------------------------------------------------ Invalid input(s): POCBNP  ---------------------------------------------------------------------------------------------------------------  Urinalysis    Component Value Date/Time   COLORURINE STRAW (A) 09/08/2015 1949   APPEARANCEUR CLEAR (A) 09/08/2015 1949   LABSPEC 1.006 09/08/2015 1949   PHURINE 5.0 09/08/2015 1949   GLUCOSEU NEGATIVE 09/08/2015 1949   HGBUR NEGATIVE 09/08/2015 1949   BILIRUBINUR  NEGATIVE 09/08/2015 1949   KETONESUR NEGATIVE 09/08/2015 1949   PROTEINUR NEGATIVE 09/08/2015 1949   NITRITE NEGATIVE 09/08/2015 1949   LEUKOCYTESUR NEGATIVE 09/08/2015 1949     RADIOLOGY: Dg Chest 2 View  Result Date: 05/20/2016 CLINICAL DATA:  Cough and shortness of breath for 3 weeks. EXAM: CHEST  2 VIEW COMPARISON:  September 08, 2015 FINDINGS: Stable cardiomegaly. Mild interstitial prominence without overt edema. Mild atelectasis in the bases. No suspicious infiltrates. No other interval changes or acute abnormalities. IMPRESSION: Mild interstitial prominence without overt edema. The findings could represent pulmonary venous congestion in the appropriate clinical setting. Mild atelectasis in the bases without suspicious infiltrate. Electronically Signed   By: Dorise Bullion III M.D   On: 05/20/2016 22:25   Ct Chest Wo Contrast  Result Date: 05/21/2016 CLINICAL DATA:  Leukocytosis and productive cough. EXAM: CT CHEST WITHOUT CONTRAST TECHNIQUE: Multidetector CT imaging of the chest was performed following the standard protocol without IV contrast. COMPARISON:  Radiographs earlier this day FINDINGS: Cardiovascular:  Aortic atherosclerosis without aneurysm. Coronary artery calcifications. Multi chamber cardiomegaly. Mediastinum/Nodes: Shotty mediastinal lymph nodes globe on most prominent in the upper paratracheal region. Largest node measures 9 mm. No bulky hilar adenopathy, assessment is limited secondary to lack of contrast. There is no pericardial effusion. The esophagus is decompressed. Enlargement of the left thyroid gland compared to the right, with a probable exophytic nodule from the lower pole. Lungs/Pleura: No confluent airspace disease to suggest pneumonia. Scattered subsegmental atelectasis within both lower lobes. There is mild heterogeneous attenuation of lung parenchyma which can be seen in the setting of emphysema, small airways disease or air trapping. No septal thickening or findings to  suggest pulmonary edema. No pleural effusion. Trachea and mainstem bronchi are patent. Upper Abdomen: The liver is enlarged with steatosis, partially included. Cholecystectomy clips. No acute abnormality. Musculoskeletal: There are no acute or suspicious osseous abnormalities. Degenerative change throughout spine. IMPRESSION: 1. No evidence of pneumonia. Heterogeneous parenchymal attenuation can be seen in the setting of emphysema, small airways disease or air trapping. 2. Aortic atherosclerosis without aneurysm. Cardiomegaly. Coronary artery calcifications. 3. Shotty upper mediastinal lymph nodes are likely reactive. 4. Hepatomegaly and hepatic steatosis incidentally noted in the upper abdomen. Electronically Signed   By: Jeb Levering M.D.   On: 05/21/2016 01:27    EKG: Orders placed or performed during the hospital encounter of 05/20/16  . ED EKG  . ED EKG  . EKG 12-Lead  . EKG 12-Lead    IMPRESSION AND PLAN: 61 year old female patient with history of COPD on home oxygen, diabetes mellitus, hyperlipidemia, GERD, hypothyroidism, peripheral vascular disease, sleep apnea presented to the emergency room with increased shortness of breath and swelling in the legs and wheezing. Admitting diagnosis 1. Acute COPD exacerbation 2. Edema lower extremities 3. Dyspnea 4. Hypokalemia 5. Type 2 diabetes mellitus Treatment plan Admit patient to telemetry Diurese patient with IV Lasix Check echocardiogram Nebulization treatment around-the-clock Start patient on IV Solu-Medrol Follow-up WBC count Start patient on IV Rocephin and IV Zithromax antibiotic Replace potassium DVT prophylaxis with subcutaneous Lovenox 40 MG daily Supportive care.   All the records are reviewed and case discussed with ED provider. Management plans discussed with the patient, family and they are in agreement.  CODE STATUS:FULL CODE Code Status History    Date Active Date Inactive Code Status Order ID Comments User  Context   09/08/2015 11:37 PM 09/10/2015  7:49 PM Full Code [id number]  Vaughan Basta, MD Inpatient       TOTAL TIME TAKING CARE OF THIS PATIENT: 54 minutes.    [name] M.D on 05/21/2016 at 3:48 AM  Between 7am to 6pm - Pager - [phone]  After 6pm go to [email or web address] - password [email or web address] [place] [facility]  Office  [phone]  CC: Primary care physician; [name], MD

## 2016-05-21 NOTE — Progress Notes (Signed)
[place] at [place] NAME: [name] [name]    MR#:  [id number]  DATE OF BIRTH:  08-17-1955  SUBJECTIVE:  CHIEF COMPLAINT:   Chief Complaint  Patient presents with  . Shortness of Breath    Patient is a 61 year old Caucasian female with past medical history significant for history of COPD, chronic respiratory failure, on 2-1/2 L of oxygen at home, degenerative joint disease, diabetes, hyperlipidemia, hypertension, hypothyroidism, irritable bowel syndrome, who presents to the hospital with complaints of shortness of breath, worsening over the past few weeks, wheezing, lower extremity swelling. The patient was recently diuresed with advanced doses of Lasix as outpatient, lost about 30 pounds. Legs are less swollen than it was a few weeks ago.   [name] ROS  VITAL SIGNS: Blood pressure (!) 122/49, pulse (!) 103, temperature 97.9 F (36.6 C), temperature source Oral, resp. rate 18, height 5\' 4"  (1.626 m), weight 119.2 kg (262 lb 11.2 oz), SpO2 95 %.  PHYSICAL EXAMINATION:   GENERAL:  61 y.o.-year-old patient lying in the bed with no acute distress.  EYES: Pupils equal, round, reactive to light and accommodation. No scleral icterus. Extraocular muscles intact.  HEENT: Head atraumatic, normocephalic. Oropharynx and nasopharynx clear.  NECK:  Supple, no jugular venous distention. No thyroid enlargement, no tenderness.  LUNGS: Diminished breath sounds bilaterally, scattered wheezing, rales,rhonchi , but no crepitations . Intermittent use of accessory muscles of respiration.  CARDIOVASCULAR: S1, S2 normal. No murmurs, rubs, or gallops.  ABDOMEN: Soft, nontender, nondistended. Bowel sounds present. No organomegaly or mass.  EXTREMITIES: 2+ lower extremity and pedal edema, no cyanosis, or clubbing. Anterior tibial erythema was noted bilaterally. There is increased warmth of the skin NEUROLOGIC: Cranial nerves II through XII are intact. Muscle strength 5/5  in all extremities. Sensation intact. Gait not checked.  PSYCHIATRIC: The patient is alert and oriented x 3.  SKIN: No obvious rash, lesion, or ulcer.   ORDERS/RESULTS REVIEWED:   CBC  Recent Labs Lab 05/20/16 2155  WBC 23.1*  HGB 10.7*  HCT 33.6*  PLT 472*  MCV 75.3*  MCH 24.0*  MCHC 31.8*  RDW 16.7*  LYMPHSABS 1.7  MONOABS 0.6  EOSABS 0.3  BASOSABS 0.1   ------------------------------------------------------------------------------------------------------------------  Chemistries   Recent Labs Lab 05/21/16 0002  NA 138  K 3.3*  CL 95*  CO2 34*  GLUCOSE 94  BUN 14  CREATININE 0.84  CALCIUM 9.4  AST 17  ALT 10*  ALKPHOS 99  BILITOT 0.6   ------------------------------------------------------------------------------------------------------------------ estimated creatinine clearance is 91.6 mL/min (by C-G formula based on SCr of 0.84 mg/dL). ------------------------------------------------------------------------------------------------------------------ No results for input(s): TSH, T4TOTAL, T3FREE, THYROIDAB in the last 72 hours.  Invalid input(s): FREET3  Cardiac Enzymes  Recent Labs Lab 05/20/16 2155 05/21/16 0539 05/21/16 1059  TROPONINI <0.03 <0.03 <0.03   ------------------------------------------------------------------------------------------------------------------ Invalid input(s): POCBNP ---------------------------------------------------------------------------------------------------------------  RADIOLOGY: Dg Chest 2 View  Result Date: 05/20/2016 CLINICAL DATA:  Cough and shortness of breath for 3 weeks. EXAM: CHEST  2 VIEW COMPARISON:  September 08, 2015 FINDINGS: Stable cardiomegaly. Mild interstitial prominence without overt edema. Mild atelectasis in the bases. No suspicious infiltrates. No other interval changes or acute abnormalities. IMPRESSION: Mild interstitial prominence without overt edema. The findings could represent pulmonary  venous congestion in the appropriate clinical setting. Mild atelectasis in the bases without suspicious infiltrate. Electronically Signed   By: Dorise Bullion III M.D   On: 05/20/2016 22:25   Ct Chest Wo Contrast  Result Date: 05/21/2016 CLINICAL  DATA:  Leukocytosis and productive cough. EXAM: CT CHEST WITHOUT CONTRAST TECHNIQUE: Multidetector CT imaging of the chest was performed following the standard protocol without IV contrast. COMPARISON:  Radiographs earlier this day FINDINGS: Cardiovascular: Aortic atherosclerosis without aneurysm. Coronary artery calcifications. Multi chamber cardiomegaly. Mediastinum/Nodes: Shotty mediastinal lymph nodes globe on most prominent in the upper paratracheal region. Largest node measures 9 mm. No bulky hilar adenopathy, assessment is limited secondary to lack of contrast. There is no pericardial effusion. The esophagus is decompressed. Enlargement of the left thyroid gland compared to the right, with a probable exophytic nodule from the lower pole. Lungs/Pleura: No confluent airspace disease to suggest pneumonia. Scattered subsegmental atelectasis within both lower lobes. There is mild heterogeneous attenuation of lung parenchyma which can be seen in the setting of emphysema, small airways disease or air trapping. No septal thickening or findings to suggest pulmonary edema. No pleural effusion. Trachea and mainstem bronchi are patent. Upper Abdomen: The liver is enlarged with steatosis, partially included. Cholecystectomy clips. No acute abnormality. Musculoskeletal: There are no acute or suspicious osseous abnormalities. Degenerative change throughout spine. IMPRESSION: 1. No evidence of pneumonia. Heterogeneous parenchymal attenuation can be seen in the setting of emphysema, small airways disease or air trapping. 2. Aortic atherosclerosis without aneurysm. Cardiomegaly. Coronary artery calcifications. 3. Shotty upper mediastinal lymph nodes are likely reactive. 4.  Hepatomegaly and hepatic steatosis incidentally noted in the upper abdomen. Electronically Signed   By: Jeb Levering M.D.   On: 05/21/2016 01:27    EKG:  Orders placed or performed during the hospital encounter of 05/20/16  . ED EKG  . ED EKG  . EKG 12-Lead  . EKG 12-Lead    ASSESSMENT AND PLAN:  Active Problems:   COPD with acute exacerbation ([place])  #1. Acute on chronic respiratory failure with hypoxia due to CHF and COPD exacerbation due to acute bronchitis, continue patient on steroids, nebulizers, oxygen, Zithromax, follow clinically #2. Acute on chronic diastolic CHF, continue Lasix, add low-dose of metoprolol, follow ins and outs #3. Bilateral Lower extremity cellulitis, continue antibiotics, add Septra DS if no significant improvement #4. Hypokalemia, supplement orally #5. Leukocytosis, follow with therapy #6. Anemia, get Hemoccult #7. Thrombocytosis, etiology is unclear, could be related to iron deficiency, get iron studies   Management plans discussed with the patient, family and they are in agreement.   DRUG ALLERGIES:  Allergies  Allergen Reactions  . Codeine Itching    CODE STATUS:     Code Status Orders        Start     Ordered   05/21/16 0440  Full code  Continuous     05/21/16 0439    Code Status History    Date Active Date Inactive Code Status Order ID Comments User Context   09/08/2015 11:37 PM 09/10/2015  7:49 PM Full Code [id number]  Vaughan Basta, MD Inpatient    Advance Directive Documentation   Flowsheet Row Most Recent Value  Type of Advance Directive  Living will  Pre-existing out of facility DNR order (yellow form or pink MOST form)  No data  "MOST" Form in Place?  No data      TOTAL TIME TAKING CARE OF THIS PATIENT: 40  minutes.    Theodoro Grist M.D on 05/21/2016 at 1:42 PM  Between 7am to 6pm - Pager - [phone]  After 6pm go to [email or web address] - password [email or web address] [place] [facility]  Office   [phone]  CC: Primary care physician; [name], MD

## 2016-05-21 NOTE — Progress Notes (Signed)
Patient refused bed alarm. Educated on safety. Agrees to call before getting out of bed.

## 2016-05-21 NOTE — ED Notes (Signed)
Pt requests something to eat and something sugar free to drink; after consulting with MD, offered pt some graham crackers and diet ginger ale. Pt expressed gratitude.

## 2016-05-21 NOTE — Progress Notes (Signed)
Pt refusing 12am and 4 am breathing treatments due to making her "cough too much" RT explained benefits of treatments, pt still refusing

## 2016-05-21 NOTE — ED Notes (Signed)
Assisted pt to ambulate to toilet and back to bed; pt still gets short of breath upon exertion. Pt ambulates well, needs assistance to get out of bed.

## 2016-05-21 NOTE — Progress Notes (Signed)
Pharmacy Antibiotic Note  [name] [name] is a 61 y.o. female admitted on 05/20/2016 with COPD exacerbation.  Pharmacy has been consulted for Ceftriaxone dosing.  Plan: Ceftriaxone 2g IV every 24 hours  Height: 5\' 4"  (162.6 cm) Weight: 290 lb (131.5 kg) IBW/kg (Calculated) : 54.7  Temp (24hrs), Avg:98.7 F (37.1 C), Min:98.7 F (37.1 C), Max:98.7 F (37.1 C)   Recent Labs Lab 05/20/16 2155 05/21/16 0002  WBC 23.1*  --   CREATININE  --  0.84    Estimated Creatinine Clearance: 97.2 mL/min (by C-G formula based on SCr of 0.84 mg/dL).    Allergies  Allergen Reactions  . Codeine Itching    Antimicrobials this admission:   Dose adjustments this admission:   Microbiology results:   Thank you for allowing pharmacy to be a part of this patient's care.  Tobie Lords, PharmD, BCPS Clinical Pharmacist 05/21/2016

## 2016-05-21 NOTE — ED Notes (Signed)
Ambulated pt from bed to toilet and back; pt is able to walk without difficulty; pt appeared short of breath after getting back in bed. Oxygen reapplied via [place] and pt's O2 sats monitored.

## 2016-05-21 NOTE — Progress Notes (Signed)
Dr. Ether Griffins notified of CBG 462. Orders to give the 20 units novolog sliding scale coverage and recheck CBG in about 2 hours to make sure CBG is improving.

## 2016-05-21 NOTE — Progress Notes (Addendum)
Patient admitted to room 236 for COPD with acute exacerbation. Patient is alert and oriented x4. Patient is in no distress and denies pain. Skin assessment completed with Tad Moore, RN. Incisions for hydradenitis noted bilaterally in groin, bilaterally in axilla, under the left breast, and on the back of the neck on the left side. Right groin incision draining with serous drainage. Patient has 2+ pitting edema bilaterally to lower extremities with skin discoloration noted.  Patient signed floor contract for safety and stated she would call for assistance. Patient refused bed alarm. CCMD was called to confirm patient telemetry box number.

## 2016-05-22 LAB — CBC
HEMATOCRIT: 32.7 % — AB (ref 35.0–47.0)
Hemoglobin: 10.6 g/dL — ABNORMAL LOW (ref 12.0–16.0)
MCH: 24.8 pg — AB (ref 26.0–34.0)
MCHC: 32.4 g/dL (ref 32.0–36.0)
MCV: 76.3 fL — AB (ref 80.0–100.0)
Platelets: 508 10*3/uL — ABNORMAL HIGH (ref 150–440)
RBC: 4.28 MIL/uL (ref 3.80–5.20)
RDW: 17.1 % — ABNORMAL HIGH (ref 11.5–14.5)
WBC: 23.9 10*3/uL — AB (ref 3.6–11.0)

## 2016-05-22 LAB — GLUCOSE, CAPILLARY
GLUCOSE-CAPILLARY: 135 mg/dL — AB (ref 65–99)
GLUCOSE-CAPILLARY: 130 mg/dL — AB (ref 65–99)
GLUCOSE-CAPILLARY: 316 mg/dL — AB (ref 65–99)
Glucose-Capillary: 92 mg/dL (ref 65–99)
Glucose-Capillary: 245 mg/dL — ABNORMAL HIGH (ref 65–99)

## 2016-05-22 LAB — HEMOGLOBIN A1C
Hgb A1c MFr Bld: 7 % — ABNORMAL HIGH (ref 4.8–5.6)
Mean Plasma Glucose: 154 mg/dL

## 2016-05-22 LAB — POTASSIUM: Potassium: 3.9 mmol/L (ref 3.5–5.1)

## 2016-05-22 MED ORDER — PREDNISONE 50 MG PO TABS
60.0000 mg | ORAL_TABLET | Freq: Every day | ORAL | Status: DC
  Administered 2016-05-22 – 2016-05-24 (×3): 60 mg via ORAL
  Filled 2016-05-22 (×3): qty 1

## 2016-05-22 MED ORDER — FERROUS GLUCONATE 324 (38 FE) MG PO TABS
324.0000 mg | ORAL_TABLET | Freq: Two times a day (BID) | ORAL | Status: DC
  Administered 2016-05-22 – 2016-05-24 (×5): 324 mg via ORAL
  Filled 2016-05-22 (×6): qty 1

## 2016-05-22 MED ORDER — ENOXAPARIN SODIUM 40 MG/0.4ML ~~LOC~~ SOLN
40.0000 mg | Freq: Two times a day (BID) | SUBCUTANEOUS | Status: DC
  Administered 2016-05-22 – 2016-05-24 (×4): 40 mg via SUBCUTANEOUS
  Filled 2016-05-22 (×4): qty 0.4

## 2016-05-22 MED ORDER — BENZONATATE 100 MG PO CAPS: 200.0000 mg | ORAL_CAPSULE | Freq: Three times a day (TID) | ORAL | Status: DC | PRN

## 2016-05-22 MED ORDER — FUROSEMIDE 40 MG PO TABS
40.0000 mg | ORAL_TABLET | Freq: Every day | ORAL | Status: DC
  Administered 2016-05-22 – 2016-05-24 (×3): 40 mg via ORAL
  Filled 2016-05-22 (×3): qty 1

## 2016-05-22 MED ORDER — BUDESONIDE 0.25 MG/2ML IN SUSP
0.2500 mg | Freq: Two times a day (BID) | RESPIRATORY_TRACT | Status: DC
  Administered 2016-05-22 – 2016-05-24 (×5): 0.25 mg via RESPIRATORY_TRACT
  Filled 2016-05-22 (×6): qty 2

## 2016-05-22 NOTE — Progress Notes (Signed)
Pharmacy Home Medication Reconciliation Communication High Risk Medication: Humulin U-500 Insulin  Home dose of Humulin U-500 insulin was reordered for this patient. The dose was verified with the patient as listed below:  Patient uses Humulin R U-500 Kwikpen (500units/ml) at home. Home dose= 120 units BIDWM.  See diabetes coordinator note.  [name] [name] A 05/22/2016  10:30 AM

## 2016-05-22 NOTE — Progress Notes (Signed)
CBG 92 this morning compared to 300-400s last night. Sliding scale insulin held per order parameters. Spoke with Dr. Ether Griffins, instructed to proceed with regular insulin pen dose due this morning as patient is eating well and still receiving steroids.

## 2016-05-22 NOTE — Care Management (Signed)
Admitted to this facility with the diagnosis of COPD. Lives alone. Daughter is Lennon Alstrom [phone]). Last seen Dr. Posey Pronto 3-4 weeks ago. Seen PA last week. Chronic home oxygen per [facility] x 1 year this June. 2.5 liters per nasal cannula continuous. [facility] for nursing services in the past. Last visit was about 3 months ago. No skilled facilty. Nebulizer and rolling walker in the home. Takes care of all basic activities of daily living herself, drives. Prescriptions are filled at [facility] on [facility]. No falls. Great appetite.  Possible discharge 05/23/16. Daughter will transport. Shelbie Ammons RN MSN CCM Care Management

## 2016-05-22 NOTE — Progress Notes (Signed)
[place] [facility] [place] NAME: [name] [name]    MR#:  [id number]  DATE OF BIRTH:  [birth year]  SUBJECTIVE:  CHIEF COMPLAINT:   Chief Complaint  Patient presents with  . Shortness of Breath    Patient is a 61 year old Caucasian female with past medical history significant for history of COPD, chronic respiratory failure, on 2-1/2 L of oxygen at home, degenerative joint disease, diabetes, hyperlipidemia, hypertension, hypothyroidism, irritable bowel syndrome, who presents to the hospital with complaints of shortness of breath, worsening over the past few weeks, wheezing, lower extremity swelling. The patient was recently diuresed with advanced doses of Lasix as outpatient, lost about 30 pounds. Legs are less swollen than it was a few weeks ago. Erythema in the anterior shins of lower extremities is improving on azithromycin. Patient continues to wheeze, has cough with no significant sputum production, refuses duo nebs due to worsening cough  . Review of Systems  Constitutional: Negative for chills, fever and weight loss.  HENT: Negative for congestion.   Eyes: Negative for blurred vision and double vision.  Respiratory: Positive for cough, shortness of breath and wheezing. Negative for sputum production.   Cardiovascular: Negative for chest pain, palpitations, orthopnea, leg swelling and PND.  Gastrointestinal: Negative for abdominal pain, blood in stool, constipation, diarrhea, nausea and vomiting.  Genitourinary: Negative for dysuria, frequency, hematuria and urgency.  Musculoskeletal: Negative for falls.  Neurological: Negative for dizziness, tremors, focal weakness and headaches.  Endo/Heme/Allergies: Does not bruise/bleed easily.  Psychiatric/Behavioral: Negative for depression. The patient does not have insomnia.     VITAL SIGNS: Blood pressure (!) 120/57, pulse 88, temperature 98.4 F (36.9 C), temperature source Oral, resp. rate (!)  21, height 5\' 4"  (1.626 m), weight 119.2 kg (262 lb 11.2 oz), SpO2 97 %.  PHYSICAL EXAMINATION:   GENERAL:  61 y.o.-year-old patient lying in the bed with no acute distress.  EYES: Pupils equal, round, reactive to light and accommodation. No scleral icterus. Extraocular muscles intact.  HEENT: Head atraumatic, normocephalic. Oropharynx and nasopharynx clear.  NECK:  Supple, no jugular venous distention. No thyroid enlargement, no tenderness.  LUNGS: Diminished breath sounds bilaterally, diffuse bilateral wheezing, rales,rhonchi , but no crepitations . Intermittent use of accessory muscles of respiration.  CARDIOVASCULAR: S1, S2 normal. No murmurs, rubs, or gallops.  ABDOMEN: Soft, nontender, nondistended. Bowel sounds present. No organomegaly or mass.  EXTREMITIES: 2+ lower extremity and pedal edema, no cyanosis, or clubbing. Anterior tibial erythema was noted bilaterally, improving, not as aggressive red appearance, as it was yesterday  NEUROLOGIC: Cranial nerves II through XII are intact. Muscle strength 5/5 in all extremities. Sensation intact. Gait not checked.  PSYCHIATRIC: The patient is alert and oriented x 3.  SKIN: No obvious rash, lesion, or ulcer.   ORDERS/RESULTS REVIEWED:   CBC  Recent Labs Lab 05/20/16 2155 05/22/16 0445  WBC 23.1* 23.9*  HGB 10.7* 10.6*  HCT 33.6* 32.7*  PLT 472* 508*  MCV 75.3* 76.3*  MCH 24.0* 24.8*  MCHC 31.8* 32.4  RDW 16.7* 17.1*  LYMPHSABS 1.7  --   MONOABS 0.6  --   EOSABS 0.3  --   BASOSABS 0.1  --    ------------------------------------------------------------------------------------------------------------------  Chemistries   Recent Labs Lab 05/21/16 0002 05/22/16 0445  NA 138  --   K 3.3* 3.9  CL 95*  --   CO2 34*  --   GLUCOSE 94  --   BUN 14  --   CREATININE 0.84  --  CALCIUM 9.4  --   AST 17  --   ALT 10*  --   ALKPHOS 99  --   BILITOT 0.6  --     ------------------------------------------------------------------------------------------------------------------ estimated creatinine clearance is 91.6 mL/min (by C-G formula based on SCr of 0.84 mg/dL). ------------------------------------------------------------------------------------------------------------------ No results for input(s): TSH, T4TOTAL, T3FREE, THYROIDAB in the last 72 hours.  Invalid input(s): FREET3  Cardiac Enzymes  Recent Labs Lab 05/21/16 0539 05/21/16 1059 05/21/16 1627  TROPONINI <0.03 <0.03 <0.03   ------------------------------------------------------------------------------------------------------------------ Invalid input(s): POCBNP ---------------------------------------------------------------------------------------------------------------  RADIOLOGY: Dg Chest 2 View  Result Date: 05/20/2016 CLINICAL DATA:  Cough and shortness of breath for 3 weeks. EXAM: CHEST  2 VIEW COMPARISON:  September 08, 2015 FINDINGS: Stable cardiomegaly. Mild interstitial prominence without overt edema. Mild atelectasis in the bases. No suspicious infiltrates. No other interval changes or acute abnormalities. IMPRESSION: Mild interstitial prominence without overt edema. The findings could represent pulmonary venous congestion in the appropriate clinical setting. Mild atelectasis in the bases without suspicious infiltrate. Electronically Signed   By: Dorise Bullion III M.D   On: 05/20/2016 22:25   Ct Chest Wo Contrast  Result Date: 05/21/2016 CLINICAL DATA:  Leukocytosis and productive cough. EXAM: CT CHEST WITHOUT CONTRAST TECHNIQUE: Multidetector CT imaging of the chest was performed following the standard protocol without IV contrast. COMPARISON:  Radiographs earlier this day FINDINGS: Cardiovascular: Aortic atherosclerosis without aneurysm. Coronary artery calcifications. Multi chamber cardiomegaly. Mediastinum/Nodes: Shotty mediastinal lymph nodes globe on most prominent in the  upper paratracheal region. Largest node measures 9 mm. No bulky hilar adenopathy, assessment is limited secondary to lack of contrast. There is no pericardial effusion. The esophagus is decompressed. Enlargement of the left thyroid gland compared to the right, with a probable exophytic nodule from the lower pole. Lungs/Pleura: No confluent airspace disease to suggest pneumonia. Scattered subsegmental atelectasis within both lower lobes. There is mild heterogeneous attenuation of lung parenchyma which can be seen in the setting of emphysema, small airways disease or air trapping. No septal thickening or findings to suggest pulmonary edema. No pleural effusion. Trachea and mainstem bronchi are patent. Upper Abdomen: The liver is enlarged with steatosis, partially included. Cholecystectomy clips. No acute abnormality. Musculoskeletal: There are no acute or suspicious osseous abnormalities. Degenerative change throughout spine. IMPRESSION: 1. No evidence of pneumonia. Heterogeneous parenchymal attenuation can be seen in the setting of emphysema, small airways disease or air trapping. 2. Aortic atherosclerosis without aneurysm. Cardiomegaly. Coronary artery calcifications. 3. Shotty upper mediastinal lymph nodes are likely reactive. 4. Hepatomegaly and hepatic steatosis incidentally noted in the upper abdomen. Electronically Signed   By: Jeb Levering M.D.   On: 05/21/2016 01:27    EKG:  Orders placed or performed during the hospital encounter of 05/20/16  . ED EKG  . ED EKG  . EKG 12-Lead  . EKG 12-Lead    ASSESSMENT AND PLAN:  Active Problems:   COPD with acute exacerbation ([place])  #1. Acute on chronic respiratory failure with hypoxia due to CHF and COPD exacerbation due to acute bronchitis, continue patient on steroids, nebulizers, oxygen, Zithromax, No significant improvement yet clinically, add budesonide nebulizers, reassess in the morning #2. Acute on chronic diastolic CHF, now off Lasix,  continue low-dose of metoprolol, follow ins and outs, patient is about 1.3 L negative since admission, oxygenation has improved #3. Bilateral Lower extremity cellulitis, continue Zithromax, improved clinically #4. Hypokalemia, supplemented orally home in improved #5. Leukocytosis, no significant change, likely due to steroids  #6. Anemia, get Hemoccult, stable #7.  Thrombocytosis, due to iron deficiency, as per iron studies, start iron supplementation, awaiting for Hemoccult   Management plans discussed with the patient, family and they are in agreement.   DRUG ALLERGIES:  Allergies  Allergen Reactions  . Codeine Itching    CODE STATUS:     Code Status Orders        Start     Ordered   05/21/16 0440  Full code  Continuous     05/21/16 0439    Code Status History    Date Active Date Inactive Code Status Order ID Comments User Context   09/08/2015 11:37 PM 09/10/2015  7:49 PM Full Code [id number]  Vaughan Basta, MD Inpatient    Advance Directive Documentation   Flowsheet Row Most Recent Value  Type of Advance Directive  Living will  Pre-existing out of facility DNR order (yellow form or pink MOST form)  No data  "MOST" Form in Place?  No data      TOTAL TIME TAKING CARE OF THIS PATIENT: 35 minutes.    Theodoro Grist M.D on 05/22/2016 at 1:12 PM  Between 7am to 6pm - Pager - [phone]  After 6pm go to [email or web address] - password [email or web address] [facility]  Office  [phone]  CC: Primary care physician; [name], MD

## 2016-05-22 NOTE — Progress Notes (Addendum)
Inpatient Diabetes Program Recommendations  AACE/ADA: New Consensus Statement on Inpatient Glycemic Control (2015)  Target Ranges:  Prepandial:   less than 140 mg/dL      Peak postprandial:   less than 180 mg/dL (1-2 hours)      Critically ill patients:  140 - 180 mg/dL   Lab Results  Component Value Date   GLUCAP 92 05/22/2016   HGBA1C 7.0 (H) 05/21/2016    Review of Glycemic Control:  Results for KAEDYNCE, HOE (MRN [id number]) as of 05/22/2016 09:45  Ref. Range 05/21/2016 08:16 05/21/2016 11:30 05/21/2016 14:24 05/21/2016 16:49 05/21/2016 20:57 05/22/2016 07:47  Glucose-Capillary Latest Ref Range: 65 - 99 mg/dL 417 (H) 462 (H) 414 (H) 401 (H) 360 (H) 92   Diabetes history: Type 2 diabetes Outpatient Diabetes medications: U500 kwikpen- 120 units bid, Tradjenta 5 mg daily Current orders for Inpatient glycemic control:  U500 120 units bid-kwikpen, Novolog resistant tid with meals and HS, Tradjenta 5 mg daily, Prednisone 60 mg daily  Inpatient Diabetes Program Recommendations:   Fasting blood glucose improved this morning. Patient continues of PO steroids a long with home dose of U500 insulin.  Will follow. Spoke briefly with patient.  She confirms that she uses U500 pen at home and that her dose is 120 units bid.  Blood sugars improved today.  Will follow.  Thanks, Adah Perl, RN, BC-ADM Inpatient Diabetes Coordinator Pager [phone] (8a-5p)

## 2016-05-22 NOTE — Progress Notes (Signed)
Brief Nutrition Note  Pt had an MST score of 3.   Body mass index is 45.2 kg/m. Pt meets criteria for morbid obesity based on current BMI.  Pt reports recent weight loss but attributes this to fluid fluctuations. Per chart review, pt has experienced fluctuations in weight for the past year.   Wt Readings from Last 10 Encounters:  05/21/16 262 lb 11.2 oz (119.2 kg)  03/28/16 292 lb 6.4 oz (132.6 kg)  02/01/16 274 lb (124.3 kg)  01/20/16 274 lb (124.3 kg)  12/31/15 290 lb (131.5 kg)  12/23/15 280 lb (127 kg)  12/15/15 280 lb (127 kg)  12/07/15 282 lb (127.9 kg)  09/08/15 290 lb (131.5 kg)  08/27/15 296 lb 6.4 oz (134.4 kg)   Current diet order is carbohydrate modified, patient is consuming approximately 100% of meals at this time. Pt reports eating well PTA, at least two meals per day, breakfast of eggs, bacon, oatmeal and dinner may consist of meatloaf, potatoes and greens; as well as snacks throughout the day.   Labs and medications reviewed. CBG (92-462)  Nutrition-focused physical exam completed. Findings are no fat depletion, no muscle depletion, and moderate edema.  No further nutrition interventions warranted at this time. If additional nutrition issues arise, please re-consult RD.  [place] Ranger Dietetic Intern

## 2016-05-23 DIAGNOSIS — D473 Essential (hemorrhagic) thrombocythemia: Secondary | ICD-10-CM

## 2016-05-23 DIAGNOSIS — L03116 Cellulitis of left lower limb: Secondary | ICD-10-CM

## 2016-05-23 DIAGNOSIS — L03115 Cellulitis of right lower limb: Secondary | ICD-10-CM

## 2016-05-23 DIAGNOSIS — D72829 Elevated white blood cell count, unspecified: Secondary | ICD-10-CM

## 2016-05-23 DIAGNOSIS — D509 Iron deficiency anemia, unspecified: Secondary | ICD-10-CM | POA: Insufficient documentation

## 2016-05-23 DIAGNOSIS — D75839 Thrombocytosis, unspecified: Secondary | ICD-10-CM

## 2016-05-23 DIAGNOSIS — J9621 Acute and chronic respiratory failure with hypoxia: Secondary | ICD-10-CM

## 2016-05-23 DIAGNOSIS — J209 Acute bronchitis, unspecified: Secondary | ICD-10-CM

## 2016-05-23 LAB — GLUCOSE, CAPILLARY
GLUCOSE-CAPILLARY: 371 mg/dL — AB (ref 65–99)
GLUCOSE-CAPILLARY: 375 mg/dL — AB (ref 65–99)
Glucose-Capillary: 115 mg/dL — ABNORMAL HIGH (ref 65–99)
Glucose-Capillary: 202 mg/dL — ABNORMAL HIGH (ref 65–99)
Glucose-Capillary: 412 mg/dL — ABNORMAL HIGH (ref 65–99)

## 2016-05-23 MED ORDER — NAPHAZOLINE-GLYCERIN 0.012-0.2 % OP SOLN
1.0000 [drp] | Freq: Four times a day (QID) | OPHTHALMIC | Status: DC | PRN
  Administered 2016-05-23 – 2016-05-24 (×2): 2 [drp] via OPHTHALMIC
  Filled 2016-05-23: qty 15

## 2016-05-23 MED ORDER — DOXYCYCLINE HYCLATE 100 MG PO CAPS: 100.0000 mg | ORAL_CAPSULE | Freq: Two times a day (BID) | ORAL | 0 refills | Status: DC

## 2016-05-23 MED ORDER — BENZONATATE 200 MG PO CAPS: 200.0000 mg | ORAL_CAPSULE | Freq: Three times a day (TID) | ORAL | 0 refills | Status: DC | PRN

## 2016-05-23 MED ORDER — FERROUS GLUCONATE 324 (38 FE) MG PO TABS: 324.0000 mg | ORAL_TABLET | Freq: Two times a day (BID) | ORAL | 3 refills | Status: DC

## 2016-05-23 MED ORDER — PREDNISONE 10 MG PO TABS: 10.0000 mg | ORAL_TABLET | Freq: Every day | ORAL | 0 refills | Status: DC

## 2016-05-23 MED ORDER — MECLIZINE HCL 25 MG PO TABS
25.0000 mg | ORAL_TABLET | Freq: Three times a day (TID) | ORAL | Status: DC | PRN
  Administered 2016-05-23: 25 mg via ORAL
  Filled 2016-05-23: qty 1

## 2016-05-23 MED ORDER — NAPHAZOLINE-GLYCERIN 0.012-0.2 % OP SOLN: 1.0000 [drp] | Freq: Four times a day (QID) | OPHTHALMIC | 3 refills | Status: DC | PRN

## 2016-05-23 NOTE — Evaluation (Signed)
Physical Therapy Evaluation Patient Details Name: [name] [name] MRN: [id number] DOB: 04-20-1955 Today's Date: 05/23/2016   History of Present Illness  Pt is a 61 yo female presented to ED w/ difficulty breathing, SOB and swelling of BLEs. Pt diagnosed w/ acute COPD exacerbations due to bronchitis. PMH includes; COPD (on home O2), DJD of L knee, DM II, HLD, GERD, hypothyroidism, irritable bowel syndrome, and hidradenitis    Clinical Impression  Pt willing to participate in PT eval and demonstrated good safety awareness and participation throughout session. Overall strength appears WFLs for tasks assessed and patient able to perform bed mobility and transfers w/ modified independence. Pt ambulated w/ RW and PT supervision and displayed decrease activity tolerance secondary to cardiopulmonary impairments; O2 remained above 90% throughout session on 3 L/min. She did display increased work of breathing and SOB near the end of ambulation w/ complaints of dizziness that resolved when safely transferred to bed. Pt states she is not very active at baseline and uses 24/7 supplemental O2 at 2.5 L/min flow rate. Overall patient is limited in safe functional mobility due to decreased activity tolerance and will benefit from skilled PT to correct deficits. Recommend Pt transition to [place] following acute hospital stay.     Follow Up Recommendations Home health PT    Equipment Recommendations  Other (comment) (new 4 wheel walker )    Recommendations for Other Services       Precautions / Restrictions Precautions Precautions: Fall Restrictions Weight Bearing Restrictions: No      Mobility  Bed Mobility Overal bed mobility: Modified Independent             General bed mobility comments: requires increased time and use of UE to move to EOB, able to return to supine, cuing for breathing when scooting up in the bed  Transfers Overall transfer level: Modified independent Equipment used: Rolling  walker (2 wheeled)             General transfer comment: able to transfer to standing w use of b UEs  Ambulation/Gait Ambulation/Gait assistance: Supervision Ambulation Distance (Feet): 80 Feet Assistive device: Rolling walker (2 wheeled) Gait Pattern/deviations: Step-through pattern;Decreased stride length     General Gait Details: Pt ambulated halfway around nursing station, O2 above 90% on 3 L/min, use of RW for additional stability, stated she felt dizzy and light headed near end of ambulation and safely returned to bed   Stairs            Wheelchair Mobility    Modified Rankin (Stroke Patients Only)       Balance Overall balance assessment: Needs assistance Sitting-balance support: No upper extremity supported;Feet supported Sitting balance-Leahy Scale: Good Sitting balance - Comments: able to maintain seated posture w/o back support   Standing balance support: Bilateral upper extremity supported;During functional activity Standing balance-Leahy Scale: Good Standing balance comment: increased stability w/ RW                              Pertinent Vitals/Pain Pain Assessment: No/denies pain    Home Living Family/patient expects to be discharged to:: Private residence Living Arrangements: Alone   Type of Home: House Home Access: Level entry     Home Layout: One level Home Equipment: Environmental consultant - 4 wheels;Other (comment) (home O2) Additional Comments: says breaks do not work on rollator     Prior Function Level of Independence: Independent with assistive device(s)  Comments: uses RW and home O2 at baseline (2.5 L/min)      Hand Dominance        Extremity/Trunk Assessment   Upper Extremity Assessment Upper Extremity Assessment: Overall WFL for tasks assessed    Lower Extremity Assessment Lower Extremity Assessment: Overall WFL for tasks assessed       Communication   Communication: No difficulties  Cognition  Arousal/Alertness: Awake/alert Behavior During Therapy: WFL for tasks assessed/performed Overall Cognitive Status: Within Functional Limits for tasks assessed                      General Comments      Exercises     Assessment/Plan    PT Assessment Patient needs continued PT services  PT Problem List Decreased activity tolerance;Decreased balance;Cardiopulmonary status limiting activity       PT Treatment Interventions DME instruction;Gait training;Functional mobility training;Balance training;Therapeutic exercise;Therapeutic activities;Patient/family education    PT Goals (Current goals can be found in the Care Plan section)  Acute Rehab PT Goals Patient Stated Goal: Return home  PT Goal Formulation: With patient Time For Goal Achievement: 06/06/16 Potential to Achieve Goals: Good    Frequency Min 2X/week   Barriers to discharge Decreased caregiver support pt lives alone w/o family support    Co-evaluation               End of Session Equipment Utilized During Treatment: Gait belt;Oxygen Activity Tolerance: Patient limited by fatigue Patient left: in bed;with call bell/phone within reach;with bed alarm set Nurse Communication: Mobility status PT Visit Diagnosis: Dizziness and giddiness (R42);Difficulty in walking, not elsewhere classified (R26.2)         Time: [id number] PT Time Calculation (min) (ACUTE ONLY): 23 min   Charges:         PT G Codes:         Tasheika Kitzmiller Student PT 05/23/2016, 4:07 PM

## 2016-05-23 NOTE — Care Management (Signed)
Discharge to home today per Dr. Ether Griffins. No follow-up needs identified. Shelbie Ammons RN MSN CCM Care Management

## 2016-05-23 NOTE — Discharge Summary (Signed)
[facility] NAME: [name] [name]    MR#:  [id number]  DATE OF BIRTH:  1955-05-11  DATE OF ADMISSION:  05/20/2016 ADMITTING PHYSICIAN: [name], MD  DATE OF DISCHARGE: No discharge date for patient encounter.  PRIMARY CARE PHYSICIAN: [name], MD     ADMISSION DIAGNOSIS:  Dyspnea on exertion [R06.09] COPD exacerbation (HCC) [J44.1] Acute on chronic congestive heart failure, unspecified congestive heart failure type ([place]) [I50.9]  DISCHARGE DIAGNOSIS:  Active Problems:   COPD with acute exacerbation (HCC)   Acute on chronic respiratory failure with hypoxia (HCC)   Acute bronchitis   Bilateral lower leg cellulitis   Leukocytosis   Iron deficiency anemia   Thrombocytosis ([place])   SECONDARY DIAGNOSIS:   Past Medical History:  Diagnosis Date  . Allergic rhinitis   . Allergy   . Back pain   . COPD (chronic obstructive pulmonary disease) ([place])   . Degenerative joint disease of knee, left   . Depression   . Diabetes mellitus without complication ([place])   . Edema   . Elevated lipids   . GERD (gastroesophageal reflux disease)   . Hidradenitis   . History of colonic polyps   . Hypertension   . Hypothyroidism   . IBS (irritable bowel syndrome)   . Insomnia   . Obesity   . Oxygen dependent   . Pneumonia   . PVD (peripheral vascular disease) ([place])   . Sinusitis, chronic   . Sleep apnea   . Thyroid disease   . Vaginitis, atrophic   . Vertigo   . Vitamin D deficiency     .pro HOSPITAL COURSE:  Patient is a 61 year old Caucasian female with past medical history significant for history of COPD, chronic respiratory failure, on 2-1/2 L of oxygen at home, degenerative joint disease, diabetes, hyperlipidemia, hypertension, hypothyroidism, irritable bowel syndrome, who presents to the hospital with complaints of shortness of breath, worsening over the past few weeks, wheezing, lower extremity swelling. The patient  was recently diuresed with advanced doses of Lasix as outpatient, lost about 30 pounds. Legs were less swollen than it was a few weeks ago. The patient feels satisfactory today, still complains of cough, no significant sputum production, some wheezing, some shortness of breath, on 2 L of oxygen through nasal cannula with good oxygen saturation. Chest x-ray revealed pulmonary venous congestion. Patient was managed on Lasix, antibiotics, steroids, and improved some. Admits of wheezing at home in good health as well. Physical therapist is to evaluate patient and decided requested discharge planning, possible discharge home today. Discussion by problem 1. Acute on chronic respiratory failure with hypoxia due to CHF and COPD exacerbation due to acute bronchitis, taper steroids, continue nebulizers, oxygen, doxycycline, steroid inhalers, stable, some improved clinically. Patient admits of significant somnolence during daytime, was recommended CPAP machine for obstructive sleep apnea, however, does not use one. Patient was encouraged to follow-up with pulmonologist, Dr. [name] and discussed with him CPAP options, she was explained that oxygen via nasal cannulas may not provide enough oxygen in her brain during nighttime.  #2. Acute on chronic diastolic CHF, resume home doses of Lasix, continue low-dose of metoprolol, follow ins and outs, patient is about 2.1 L negative since admission, oxygenation has improved #3. Bilateral Lower extremity cellulitis, continue doxycycline, improved clinically #4. Hypokalemia, supplemented orally and  improved #5. Leukocytosis, no significant change, likely due to steroids, follow as outpatient  #6. Iron deficiency anemia, as per iron studies, saturation was  only 9, initiate patient on iron supplementation orally. Aspirin,, ordered Hemoccult, not obtained in the hospital, it is recommended to follow up as outpatient #7. Thrombocytosis, due to iron deficiency, as per iron studies,  started iron supplementation, get Hemoccult done as outpatient   DISCHARGE CONDITIONS:   Stable  CONSULTS OBTAINED:    DRUG ALLERGIES:   Allergies  Allergen Reactions  . Codeine Itching    DISCHARGE MEDICATIONS:   Current Discharge Medication List    START taking these medications   Details  benzonatate (TESSALON) 200 MG capsule Take 1 capsule (200 mg total) by mouth 3 (three) times daily as needed for cough. Qty: 20 capsule, Refills: 0    doxycycline (VIBRAMYCIN) 100 MG capsule Take 1 capsule (100 mg total) by mouth 2 (two) times daily. Qty: 14 capsule, Refills: 0    ferrous gluconate (FERGON) 324 MG tablet Take 1 tablet (324 mg total) by mouth 2 (two) times daily with a meal. Qty: 60 tablet, Refills: 3    naphazoline-glycerin (CLEAR EYES) 0.012-0.2 % SOLN Place 1-2 drops into both eyes 4 (four) times daily as needed for irritation. Qty: 1 Bottle, Refills: 3    predniSONE (DELTASONE) 10 MG tablet Take 1 tablet (10 mg total) by mouth daily with breakfast. Please take 6 pills in the morning on the day 1 and 2, then taper by one pill every 2 days until finished, thank you Qty: 42 tablet, Refills: 0      CONTINUE these medications which have NOT CHANGED   Details  albuterol (PROVENTIL) (2.5 MG/3ML) 0.083% nebulizer solution Take 2.5 mg by nebulization every 6 (six) hours as needed for wheezing or shortness of breath.     atorvastatin (LIPITOR) 20 MG tablet Take 20 mg by mouth at bedtime.     budesonide-formoterol (SYMBICORT) 160-4.5 MCG/ACT inhaler Inhale 2 puffs into the lungs 2 (two) times daily.    doxepin (SINEQUAN) 50 MG capsule Take 50 mg by mouth at bedtime.    DULoxetine (CYMBALTA) 20 MG capsule Take 20 mg by mouth daily.    etodolac (LODINE) 400 MG tablet Take 400 mg by mouth 2 (two) times daily.    furosemide (LASIX) 40 MG tablet Take 40 mg by mouth 2 (two) times daily.    gabapentin (NEURONTIN) 100 MG capsule Take 200 mg by mouth at bedtime.      glucose blood test strip TEST three times a day    HUMULIN R U-500 KWIKPEN 500 UNIT/ML injection Inject 120 Units into the skin 2 (two) times daily with a meal.  Refills: 1    ketoconazole (NIZORAL) 2 % shampoo Apply 1 application topically 2 (two) times a week.    Lancets Misc. (ACCU-CHEK FASTCLIX LANCET) KIT     levothyroxine (SYNTHROID, LEVOTHROID) 200 MCG tablet Take 200 mcg by mouth daily before breakfast.  Refills: 1    lisinopril-hydrochlorothiazide (PRINZIDE,ZESTORETIC) 10-12.5 MG per tablet Take 1 tablet by mouth daily.    loratadine (CLARITIN) 10 MG tablet Take 10 mg by mouth daily.    meclizine (ANTIVERT) 25 MG tablet Take 25 mg by mouth 3 (three) times daily as needed for dizziness.     mometasone (NASONEX) 50 MCG/ACT nasal spray Place 2 sprays into both nostrils daily.     montelukast (SINGULAIR) 10 MG tablet Take 10 mg by mouth at bedtime.  Refills: 1    olopatadine (PATANOL) 0.1 % ophthalmic solution Place 1 drop into both eyes 2 (two) times daily.    ondansetron (ZOFRAN) 8 MG tablet  Take 4-8 mg by mouth every 8 (eight) hours as needed for nausea or vomiting.    pantoprazole (PROTONIX) 40 MG tablet Take 40 mg by mouth daily.    potassium chloride (K-DUR) 10 MEQ tablet Take 10 mEq by mouth daily.  Refills: 1    TRADJENTA 5 MG TABS tablet Take 5 mg by mouth daily.  Refills: 1    traZODone (DESYREL) 50 MG tablet Take 50-100 mg by mouth at bedtime as needed for sleep.       STOP taking these medications     aspirin EC 81 MG tablet          DISCHARGE INSTRUCTIONS:    The patient is to follow-up with primary care physician and primary pulmonologist, Dr. [name]  If you experience worsening of your admission symptoms, develop shortness of breath, life threatening emergency, suicidal or homicidal thoughts you must seek medical attention immediately by calling 911 or calling your MD immediately  if symptoms less severe.  You Must read complete  instructions/literature along with all the possible adverse reactions/side effects for all the Medicines you take and that have been prescribed to you. Take any new Medicines after you have completely understood and accept all the possible adverse reactions/side effects.   Please note  You were cared for by a hospitalist during your hospital stay. If you have any questions about your discharge medications or the care you received while you were in the hospital after you are discharged, you can call the unit and asked to speak with the hospitalist on call if the hospitalist that took care of you is not available. Once you are discharged, your primary care physician will handle any further medical issues. Please note that NO REFILLS for any discharge medications will be authorized once you are discharged, as it is imperative that you return to your primary care physician (or establish a relationship with a primary care physician if you do not have one) for your aftercare needs so that they can reassess your need for medications and monitor your lab values.    Today   CHIEF COMPLAINT:   Chief Complaint  Patient presents with  . Shortness of Breath    HISTORY OF PRESENT ILLNESS:  [name] [name]  is a 61 y.o. female with a known history ofCOPD, chronic respiratory failure, on 2-1/2 L of oxygen at home, degenerative joint disease, diabetes, hyperlipidemia, hypertension, hypothyroidism, irritable bowel syndrome, who presents to the hospital with complaints of shortness of breath, worsening over the past few weeks, wheezing, lower extremity swelling. The patient was recently diuresed with advanced doses of Lasix as outpatient, lost about 30 pounds. Legs were less swollen than it was a few weeks ago. The patient feels satisfactory today, still complains of cough, no significant sputum production, some wheezing, some shortness of breath, on 2 L of oxygen through nasal cannula with good oxygen saturation. Chest  x-ray revealed pulmonary venous congestion. Patient was managed on Lasix, antibiotics, steroids, and improved some. Admits of wheezing at home in good health as well. Physical therapist is to evaluate patient and decided requested discharge planning, possible discharge home today. Discussion by problem 1. Acute on chronic respiratory failure with hypoxia due to CHF and COPD exacerbation due to acute bronchitis, taper steroids, continue nebulizers, oxygen, doxycycline, steroid inhalers, stable, some improved clinically. Patient admits of significant somnolence during daytime, was recommended CPAP machine for obstructive sleep apnea, however, does not use one. Patient was encouraged to follow-up with pulmonologist, Dr. [name] and  discussed with him CPAP options, she was explained that oxygen via nasal cannulas may not provide enough oxygen in her brain during nighttime.  #2. Acute on chronic diastolic CHF, resume home doses of Lasix, continue low-dose of metoprolol, follow ins and outs, patient is about 2.1 L negative since admission, oxygenation has improved #3. Bilateral Lower extremity cellulitis, continue doxycycline, improved clinically #4. Hypokalemia, supplemented orally and  improved #5. Leukocytosis, no significant change, likely due to steroids, follow as outpatient  #6. Iron deficiency anemia, as per iron studies, saturation was only 9, initiate patient on iron supplementation orally. Aspirin,, ordered Hemoccult, not obtained in the hospital, it is recommended to follow up as outpatient #7. Thrombocytosis, due to iron deficiency, as per iron studies, started iron supplementation, get Hemoccult done as outpatient     VITAL SIGNS:  Blood pressure (!) 121/53, pulse 89, temperature 98.6 F (37 C), temperature source Oral, resp. rate 18, height 5' 4"  (1.626 m), weight 119.2 kg (262 lb 11.2 oz), SpO2 97 %.  I/O:   Intake/Output Summary (Last 24 hours) at 05/23/16 1333 Last data filed at  05/23/16 1028  Gross per 24 hour  Intake              600 ml  Output             1400 ml  Net             -800 ml    PHYSICAL EXAMINATION:  GENERAL:  61 y.o.-year-old patient lying in the bed with no acute distress.  EYES: Pupils equal, round, reactive to light and accommodation. No scleral icterus. Extraocular muscles intact.  HEENT: Head atraumatic, normocephalic. Oropharynx and nasopharynx clear.  NECK:  Supple, no jugular venous distention. No thyroid enlargement, no tenderness.  LUNGS: Normal breath sounds bilaterally, no wheezing, rales,rhonchi or crepitation. No use of accessory muscles of respiration.  CARDIOVASCULAR: S1, S2 normal. No murmurs, rubs, or gallops.  ABDOMEN: Soft, non-tender, non-distended. Bowel sounds present. No organomegaly or mass.  EXTREMITIES: No pedal edema, cyanosis, or clubbing.  NEUROLOGIC: Cranial nerves II through XII are intact. Muscle strength 5/5 in all extremities. Sensation intact. Gait not checked.  PSYCHIATRIC: The patient is alert and oriented x 3.  SKIN: No obvious rash, lesion, or ulcer.   DATA REVIEW:   CBC  Recent Labs Lab 05/22/16 0445  WBC 23.9*  HGB 10.6*  HCT 32.7*  PLT 508*    Chemistries   Recent Labs Lab 05/21/16 0002 05/22/16 0445  NA 138  --   K 3.3* 3.9  CL 95*  --   CO2 34*  --   GLUCOSE 94  --   BUN 14  --   CREATININE 0.84  --   CALCIUM 9.4  --   AST 17  --   ALT 10*  --   ALKPHOS 99  --   BILITOT 0.6  --     Cardiac Enzymes  Recent Labs Lab 05/21/16 1627  TROPONINI <0.03    Microbiology Results  Results for orders placed or performed during the hospital encounter of 09/08/15  Blood Culture (routine x 2)     Status: None   Collection Time: 09/08/15  5:22 PM  Result Value Ref Range Status   Specimen Description BLOOD LEFT ASSIST CONTROL  Final   Special Requests   Final    BOTTLES DRAWN AEROBIC AND ANAEROBIC  AERO [place] ANA [place]   Culture NO GROWTH 5 DAYS  Final   Report Status 09/13/2015  FINAL  Final  Blood Culture (routine x 2)     Status: None   Collection Time: 09/08/15  5:22 PM  Result Value Ref Range Status   Specimen Description BLOOD LEFT HAND  Final   Special Requests BOTTLES DRAWN AEROBIC AND ANAEROBIC  5CC  Final   Culture NO GROWTH 5 DAYS  Final   Report Status 09/13/2015 FINAL  Final  Urine culture     Status: None   Collection Time: 09/08/15  7:49 PM  Result Value Ref Range Status   Specimen Description URINE, RANDOM  Final   Special Requests NONE  Final   Culture NO GROWTH Performed at [facility]   Final   Report Status 09/10/2015 FINAL  Final  MRSA PCR Screening     Status: None   Collection Time: 09/09/15  3:35 AM  Result Value Ref Range Status   MRSA by PCR NEGATIVE NEGATIVE Final    Comment:        The GeneXpert MRSA Assay (FDA approved for NASAL specimens only), is one component of a comprehensive MRSA colonization surveillance program. It is not intended to diagnose MRSA infection nor to guide or monitor treatment for MRSA infections.     RADIOLOGY:  No results found.  EKG:   Orders placed or performed during the hospital encounter of 05/20/16  . ED EKG  . ED EKG  . EKG 12-Lead  . EKG 12-Lead      Management plans discussed with the patient, family and they are in agreement.  CODE STATUS:     Code Status Orders        Start     Ordered   05/21/16 0440  Full code  Continuous     05/21/16 0439    Code Status History    Date Active Date Inactive Code Status Order ID Comments User Context   09/08/2015 11:37 PM 09/10/2015  7:49 PM Full Code [id number]  Vaughan Basta, MD Inpatient    Advance Directive Documentation   Flowsheet Row Most Recent Value  Type of Advance Directive  Living will  Pre-existing out of facility DNR order (yellow form or pink MOST form)  No data  "MOST" Form in Place?  No data      TOTAL TIME TAKING CARE OF THIS PATIENT: 40 minutes.    Theodoro Grist M.D on 05/23/2016 at 1:33  PM  Between 7am to 6pm - Pager - [phone]  After 6pm go to [email or web address] - password [email or web address] [facility]  Office  [phone]  CC: Primary care physician; [name], MD

## 2016-05-23 NOTE — Progress Notes (Signed)
Physical therapy reported patient c/o dizziness upon evaluation. MD notified and ordered to take orthostatic vitals. Orthostatic vitals were negative. However upon ambulating patient to bathroom patient became very short of breath and when she got back to the bed stated that she felt very "drunk" and like she was going to vomit. Patient asked was there anything she could get for vertigo. Patient placed back in bed safely with bed alarm on and call bell in reach. MD paged and notified. Patient also stated to me that she did not feel comfortable discharging to home today because she was not able to fix meals for herself because she gets so short of breath. MD notified of  this as well.  Discharge order cancelled and meclizine ordered every 8 hours PRN.

## 2016-05-24 ENCOUNTER — Other Ambulatory Visit: Payer: Self-pay | Admitting: General Surgery

## 2016-05-24 LAB — BASIC METABOLIC PANEL
ANION GAP: 10 (ref 5–15)
BUN: 27 mg/dL — AB (ref 6–20)
CALCIUM: 9 mg/dL (ref 8.9–10.3)
CO2: 29 mmol/L (ref 22–32)
Chloride: 95 mmol/L — ABNORMAL LOW (ref 101–111)
Creatinine, Ser: 0.85 mg/dL (ref 0.44–1.00)
GFR calc Af Amer: >60 mL/min (ref >60)
GFR calc non Af Amer: >60 mL/min (ref >60)
GLUCOSE: 425 mg/dL — AB (ref 65–99)
Potassium: 4.5 mmol/L (ref 3.5–5.1)
Sodium: 134 mmol/L — ABNORMAL LOW (ref 135–145)

## 2016-05-24 LAB — CBC
HEMATOCRIT: 30.2 % — AB (ref 35.0–47.0)
Hemoglobin: 9.9 g/dL — ABNORMAL LOW (ref 12.0–16.0)
MCH: 25.1 pg — ABNORMAL LOW (ref 26.0–34.0)
MCHC: 32.8 g/dL (ref 32.0–36.0)
MCV: 76.4 fL — AB (ref 80.0–100.0)
PLATELETS: 401 10*3/uL (ref 150–440)
RBC: 3.95 MIL/uL (ref 3.80–5.20)
RDW: 17.4 % — AB (ref 11.5–14.5)
WBC: 16.1 10*3/uL — AB (ref 3.6–11.0)

## 2016-05-24 LAB — GLUCOSE, CAPILLARY
GLUCOSE-CAPILLARY: 358 mg/dL — AB (ref 65–99)
Glucose-Capillary: 355 mg/dL — ABNORMAL HIGH (ref 65–99)
Glucose-Capillary: 294 mg/dL — ABNORMAL HIGH (ref 65–99)

## 2016-05-24 MED ORDER — LIVING WELL WITH DIABETES BOOK
Freq: Once | Status: AC
  Administered 2016-05-24: 17:00:00
  Filled 2016-05-24 (×2): qty 1

## 2016-05-24 NOTE — Care Management (Signed)
Discharge to home today per Dr. Ether Griffins. Discussed home health agencies with Ms. Bundick. [place]. Floydene Flock, [facility] representative updated.  Friend/family will transport. Shelbie Ammons RN MSN CCM Care Management

## 2016-05-24 NOTE — Progress Notes (Signed)
Patient is waiting for daughter to bring portable O2 tank and taker her home for discharge.

## 2016-05-24 NOTE — Progress Notes (Signed)
Patient given discharge teaching and paperwork regarding medications, diet, follow-up appointments and activity. Patient understanding verbalized. No complaints at this time. IV and telemetry discontinued prior to leaving. Skin assessment as previously charted and vitals are stable; on chronic O2, portable tank from home. Patient being discharged to home. Caregiver/family present during discharge teaching. No further needs by Care Management. Prescriptions handed to patient.

## 2016-05-24 NOTE — Discharge Summary (Signed)
[facility] NAME: [name] [name]    MR#:  [id number]  DATE OF BIRTH:  April 26, 1955  DATE OF ADMISSION:  05/20/2016 ADMITTING PHYSICIAN: [name], MD  DATE OF DISCHARGE: No discharge date for patient encounter.  PRIMARY CARE PHYSICIAN: [name], MD     ADMISSION DIAGNOSIS:  Dyspnea on exertion [R06.09] COPD exacerbation (HCC) [J44.1] Acute on chronic congestive heart failure, unspecified congestive heart failure type ([place]) [I50.9]  DISCHARGE DIAGNOSIS:  Active Problems:   COPD with acute exacerbation (HCC)   Acute on chronic respiratory failure with hypoxia (HCC)   Acute bronchitis   Bilateral lower leg cellulitis   Leukocytosis   Iron deficiency anemia   Thrombocytosis ([place])   SECONDARY DIAGNOSIS:   Past Medical History:  Diagnosis Date  . Allergic rhinitis   . Allergy   . Back pain   . COPD (chronic obstructive pulmonary disease) ([place])   . Degenerative joint disease of knee, left   . Depression   . Diabetes mellitus without complication ([place])   . Edema   . Elevated lipids   . GERD (gastroesophageal reflux disease)   . Hidradenitis   . History of colonic polyps   . Hypertension   . Hypothyroidism   . IBS (irritable bowel syndrome)   . Insomnia   . Obesity   . Oxygen dependent   . Pneumonia   . PVD (peripheral vascular disease) ([place])   . Sinusitis, chronic   . Sleep apnea   . Thyroid disease   . Vaginitis, atrophic   . Vertigo   . Vitamin D deficiency     .pro HOSPITAL COURSE:  Patient is a 61 year old Caucasian female with past medical history significant for history of COPD, chronic respiratory failure, on 2-1/2 L of oxygen at home, degenerative joint disease, diabetes, hyperlipidemia, hypertension, hypothyroidism, irritable bowel syndrome, who presents to the hospital with complaints of shortness of breath, worsening over the past few weeks, wheezing, lower extremity swelling. The patient  was recently diuresed with advanced doses of Lasix as outpatient, lost about 30 pounds. Legs were less swollen than it was a few weeks ago. The patient feels satisfactory today, still complains of cough, no significant sputum production, some wheezing, some shortness of breath, on 2 L of oxygen through nasal cannula with good oxygen saturation. Chest x-ray revealed pulmonary venous congestion. Patient was managed on Lasix, antibiotics, steroids, and improved some. Admits of wheezing at home in good health as well. Physical therapist saw patient in consultation. Yesterday, patient complained of dizziness, she was given meclizine with improvement of her condition, they'll reassess her today, recommended home health services, patient is going to be due to discharged home today. Discussion by problem 1. Acute on chronic respiratory failure with hypoxia due to CHF and COPD exacerbation due to acute bronchitis, improved clinically, tapering steroids, continue nebulizers, oxygen, doxycycline, steroid inhalers. Patient admited of significant somnolence during daytime, morning headaches, she was recommended CPAP machine for obstructive sleep apnea in the past, however, does not use one. Patient was encouraged to follow-up with pulmonologist, Dr. [name] and discuss with him CPAP options, she was explained that oxygen via nasal cannulas may not provide enough oxygen in her brain during nighttime.  #2. Acute on chronic diastolic CHF, resume home doses of Lasix, continue low-dose of metoprolol,the  patient is about 3.1 L negative during this admission, oxygenation has improved #3. Bilateral Lower extremity cellulitis, continue doxycycline to complete course, improved clinically #4. Hypokalemia,  supplemented orally and  improved #5. Leukocytosis, improved tapering steroids, follow as outpatient  #6. Iron deficiency anemia, as per iron studies, saturation was only 9, initiating patient on iron supplementation orally,  Hemoccult was negative. Holding Aspirin,, continue Protonix, follow hemoglobin level, iron studies, may need to be referred to a gastroenterologist for further evaluation, the patient will need to do outpatient stool cards #7. Thrombocytosis, due to iron deficiency, as per iron studies, started iron supplementation, improved, Hemoccult as outpatient #8 for dizziness, patient was given meclizine, resolved, physical therapist recommended home health services, to be arranged upon discharge  DISCHARGE CONDITIONS:   Stable  CONSULTS OBTAINED:    DRUG ALLERGIES:   Allergies  Allergen Reactions  . Codeine Itching    DISCHARGE MEDICATIONS:   Current Discharge Medication List    START taking these medications   Details  benzonatate (TESSALON) 200 MG capsule Take 1 capsule (200 mg total) by mouth 3 (three) times daily as needed for cough. Qty: 20 capsule, Refills: 0    doxycycline (VIBRAMYCIN) 100 MG capsule Take 1 capsule (100 mg total) by mouth 2 (two) times daily. Qty: 14 capsule, Refills: 0    ferrous gluconate (FERGON) 324 MG tablet Take 1 tablet (324 mg total) by mouth 2 (two) times daily with a meal. Qty: 60 tablet, Refills: 3    naphazoline-glycerin (CLEAR EYES) 0.012-0.2 % SOLN Place 1-2 drops into both eyes 4 (four) times daily as needed for irritation. Qty: 1 Bottle, Refills: 3    predniSONE (DELTASONE) 10 MG tablet Take 1 tablet (10 mg total) by mouth daily with breakfast. Please take 6 pills in the morning on the day 1 and 2, then taper by one pill every 2 days until finished, thank you Qty: 42 tablet, Refills: 0      CONTINUE these medications which have NOT CHANGED   Details  albuterol (PROVENTIL) (2.5 MG/3ML) 0.083% nebulizer solution Take 2.5 mg by nebulization every 6 (six) hours as needed for wheezing or shortness of breath.     atorvastatin (LIPITOR) 20 MG tablet Take 20 mg by mouth at bedtime.     budesonide-formoterol (SYMBICORT) 160-4.5 MCG/ACT inhaler  Inhale 2 puffs into the lungs 2 (two) times daily.    doxepin (SINEQUAN) 50 MG capsule Take 50 mg by mouth at bedtime.    DULoxetine (CYMBALTA) 20 MG capsule Take 20 mg by mouth daily.    etodolac (LODINE) 400 MG tablet Take 400 mg by mouth 2 (two) times daily.    furosemide (LASIX) 40 MG tablet Take 40 mg by mouth 2 (two) times daily.    gabapentin (NEURONTIN) 100 MG capsule Take 200 mg by mouth at bedtime.     glucose blood test strip TEST three times a day    HUMULIN R U-500 KWIKPEN 500 UNIT/ML injection Inject 120 Units into the skin 2 (two) times daily with a meal.  Refills: 1    ketoconazole (NIZORAL) 2 % shampoo Apply 1 application topically 2 (two) times a week.    Lancets Misc. (ACCU-CHEK FASTCLIX LANCET) KIT     levothyroxine (SYNTHROID, LEVOTHROID) 200 MCG tablet Take 200 mcg by mouth daily before breakfast.  Refills: 1    loratadine (CLARITIN) 10 MG tablet Take 10 mg by mouth daily.    meclizine (ANTIVERT) 25 MG tablet Take 25 mg by mouth 3 (three) times daily as needed for dizziness.     mometasone (NASONEX) 50 MCG/ACT nasal spray Place 2 sprays into both nostrils daily.  montelukast (SINGULAIR) 10 MG tablet Take 10 mg by mouth at bedtime.  Refills: 1    olopatadine (PATANOL) 0.1 % ophthalmic solution Place 1 drop into both eyes 2 (two) times daily.    ondansetron (ZOFRAN) 8 MG tablet Take 4-8 mg by mouth every 8 (eight) hours as needed for nausea or vomiting.    pantoprazole (PROTONIX) 40 MG tablet Take 40 mg by mouth daily.    potassium chloride (K-DUR) 10 MEQ tablet Take 10 mEq by mouth daily.  Refills: 1    TRADJENTA 5 MG TABS tablet Take 5 mg by mouth daily.  Refills: 1    traZODone (DESYREL) 50 MG tablet Take 50-100 mg by mouth at bedtime as needed for sleep.       STOP taking these medications     aspirin EC 81 MG tablet      lisinopril-hydrochlorothiazide (PRINZIDE,ZESTORETIC) 10-12.5 MG per tablet          DISCHARGE INSTRUCTIONS:     The patient is to follow-up with primary care physician and primary pulmonologist, Dr. [name]  If you experience worsening of your admission symptoms, develop shortness of breath, life threatening emergency, suicidal or homicidal thoughts you must seek medical attention immediately by calling 911 or calling your MD immediately  if symptoms less severe.  You Must read complete instructions/literature along with all the possible adverse reactions/side effects for all the Medicines you take and that have been prescribed to you. Take any new Medicines after you have completely understood and accept all the possible adverse reactions/side effects.   Please note  You were cared for by a hospitalist during your hospital stay. If you have any questions about your discharge medications or the care you received while you were in the hospital after you are discharged, you can call the unit and asked to speak with the hospitalist on call if the hospitalist that took care of you is not available. Once you are discharged, your primary care physician will handle any further medical issues. Please note that NO REFILLS for any discharge medications will be authorized once you are discharged, as it is imperative that you return to your primary care physician (or establish a relationship with a primary care physician if you do not have one) for your aftercare needs so that they can reassess your need for medications and monitor your lab values.    Today   CHIEF COMPLAINT:   Chief Complaint  Patient presents with  . Shortness of Breath    HISTORY OF PRESENT ILLNESS:  [name] [name]  is a 61 y.o. female with a known history of COPD, chronic respiratory failure, on 2-1/2 L of oxygen at home, degenerative joint disease, diabetes, hyperlipidemia, hypertension, hypothyroidism, irritable bowel syndrome, who presents to the hospital with complaints of shortness of breath, worsening over the past few weeks, wheezing,  lower extremity swelling. The patient was recently diuresed with advanced doses of Lasix as outpatient, lost about 30 pounds. Legs were less swollen than it was a few weeks ago. The patient feels satisfactory today, still complains of cough, no significant sputum production, some wheezing, some shortness of breath, on 2 L of oxygen through nasal cannula with good oxygen saturation. Chest x-ray revealed pulmonary venous congestion. Patient was managed on Lasix, antibiotics, steroids, and improved some. Admits of wheezing at home in good health as well. Physical therapist saw patient in consultation. Yesterday, patient complained of dizziness, she was given meclizine with improvement of her condition, they'll reassess her today,  recommended home health services, patient is going to be due to discharged home today. Discussion by problem 1. Acute on chronic respiratory failure with hypoxia due to CHF and COPD exacerbation due to acute bronchitis, improved clinically, tapering steroids, continue nebulizers, oxygen, doxycycline, steroid inhalers. Patient admited of significant somnolence during daytime, morning headaches, she was recommended CPAP machine for obstructive sleep apnea in the past, however, does not use one. Patient was encouraged to follow-up with pulmonologist, Dr. [name] and discuss with him CPAP options, she was explained that oxygen via nasal cannulas may not provide enough oxygen in her brain during nighttime.  #2. Acute on chronic diastolic CHF, resume home doses of Lasix, continue low-dose of metoprolol,the  patient is about 3.1 L negative during this admission, oxygenation has improved #3. Bilateral Lower extremity cellulitis, continue doxycycline to complete course, improved clinically #4. Hypokalemia, supplemented orally and  improved #5. Leukocytosis, improved tapering steroids, follow as outpatient  #6. Iron deficiency anemia, as per iron studies, saturation was only 9, initiating patient  on iron supplementation orally, Hemoccult was negative. Holding Aspirin,, continue Protonix, follow hemoglobin level, iron studies, may need to be referred to a gastroenterologist for further evaluation, the patient will need to do outpatient stool cards #7. Thrombocytosis, due to iron deficiency, as per iron studies, started iron supplementation, improved, Hemoccult as outpatient #8 for dizziness, patient was given meclizine, resolved, physical therapist recommended home health services, to be arranged upon discharge   VITAL SIGNS:  Blood pressure (!) 133/58, pulse 81, temperature 98.4 F (36.9 C), temperature source Oral, resp. rate 19, height 5' 4"  (1.626 m), weight 119.2 kg (262 lb 11.2 oz), SpO2 96 %.  I/O:    Intake/Output Summary (Last 24 hours) at 05/24/16 1332 Last data filed at 05/24/16 1025  Gross per 24 hour  Intake              960 ml  Output             1700 ml  Net             -740 ml    PHYSICAL EXAMINATION:  GENERAL:  62 y.o.-year-old patient lying in the bed with no acute distress.  EYES: Pupils equal, round, reactive to light and accommodation. No scleral icterus. Extraocular muscles intact.  HEENT: Head atraumatic, normocephalic. Oropharynx and nasopharynx clear.  NECK:  Supple, no jugular venous distention. No thyroid enlargement, no tenderness.  LUNGS: Normal breath sounds bilaterally, no wheezing, rales,rhonchi or crepitation. No use of accessory muscles of respiration.  CARDIOVASCULAR: S1, S2 normal. No murmurs, rubs, or gallops.  ABDOMEN: Soft, non-tender, non-distended. Bowel sounds present. No organomegaly or mass.  EXTREMITIES: No pedal edema, cyanosis, or clubbing.  NEUROLOGIC: Cranial nerves II through XII are intact. Muscle strength 5/5 in all extremities. Sensation intact. Gait not checked.  PSYCHIATRIC: The patient is alert and oriented x 3.  SKIN: No obvious rash, lesion, or ulcer.   DATA REVIEW:   CBC  Recent Labs Lab 05/24/16 0443  WBC 16.1*   HGB 9.9*  HCT 30.2*  PLT 401    Chemistries   Recent Labs Lab 05/21/16 0002  05/24/16 0443  NA 138  --  134*  K 3.3*  < > 4.5  CL 95*  --  95*  CO2 34*  --  29  GLUCOSE 94  --  425*  BUN 14  --  27*  CREATININE 0.84  --  0.85  CALCIUM 9.4  --  9.0  AST 17  --   --  ALT 10*  --   --   ALKPHOS 99  --   --   BILITOT 0.6  --   --   < > = values in this interval not displayed.  Cardiac Enzymes  Recent Labs Lab 05/21/16 1627  TROPONINI <0.03    Microbiology Results  Results for orders placed or performed during the hospital encounter of 09/08/15  Blood Culture (routine x 2)     Status: None   Collection Time: 09/08/15  5:22 PM  Result Value Ref Range Status   Specimen Description BLOOD LEFT ASSIST CONTROL  Final   Special Requests   Final    BOTTLES DRAWN AEROBIC AND ANAEROBIC  AERO [place] ANA [place]   Culture NO GROWTH 5 DAYS  Final   Report Status 09/13/2015 FINAL  Final  Blood Culture (routine x 2)     Status: None   Collection Time: 09/08/15  5:22 PM  Result Value Ref Range Status   Specimen Description BLOOD LEFT HAND  Final   Special Requests BOTTLES DRAWN AEROBIC AND ANAEROBIC  5CC  Final   Culture NO GROWTH 5 DAYS  Final   Report Status 09/13/2015 FINAL  Final  Urine culture     Status: None   Collection Time: 09/08/15  7:49 PM  Result Value Ref Range Status   Specimen Description URINE, RANDOM  Final   Special Requests NONE  Final   Culture NO GROWTH Performed at [facility]   Final   Report Status 09/10/2015 FINAL  Final  MRSA PCR Screening     Status: None   Collection Time: 09/09/15  3:35 AM  Result Value Ref Range Status   MRSA by PCR NEGATIVE NEGATIVE Final    Comment:        The GeneXpert MRSA Assay (FDA approved for NASAL specimens only), is one component of a comprehensive MRSA colonization surveillance program. It is not intended to diagnose MRSA infection nor to guide or monitor treatment for MRSA infections.      RADIOLOGY:  No results found.  EKG:   Orders placed or performed during the hospital encounter of 05/20/16  . ED EKG  . ED EKG  . EKG 12-Lead  . EKG 12-Lead      Management plans discussed with the patient, family and they are in agreement.  CODE STATUS:     Code Status Orders        Start     Ordered   05/21/16 0440  Full code  Continuous     05/21/16 0439    Code Status History    Date Active Date Inactive Code Status Order ID Comments User Context   09/08/2015 11:37 PM 09/10/2015  7:49 PM Full Code [id number]  Vaughan Basta, MD Inpatient    Advance Directive Documentation   Flowsheet Row Most Recent Value  Type of Advance Directive  Living will  Pre-existing out of facility DNR order (yellow form or pink MOST form)  No data  "MOST" Form in Place?  No data      TOTAL TIME TAKING CARE OF THIS PATIENT: 40 minutes.    Theodoro Grist M.D on 05/24/2016 at 1:32 PM  Between 7am to 6pm - Pager - [phone]  After 6pm go to [email or web address] - password [email or web address]  Office  [phone]  CC: Primary care physician; [name], MD

## 2016-06-01 ENCOUNTER — Ambulatory Visit: Payer: Medicaid Other | Admitting: Family

## 2016-06-02 ENCOUNTER — Telehealth: Payer: Self-pay | Admitting: Family

## 2016-06-02 ENCOUNTER — Ambulatory Visit: Payer: Medicare Other | Attending: Family | Admitting: Family

## 2016-06-02 VITALS — BP 110/45 | HR 88 | Resp 18 | Ht 63.0 in | Wt 281.4 lb

## 2016-06-02 DIAGNOSIS — I130 Hypertensive heart and chronic kidney disease with heart failure and stage 1 through stage 4 chronic kidney disease, or unspecified chronic kidney disease: Secondary | ICD-10-CM | POA: Diagnosis not present

## 2016-06-02 DIAGNOSIS — Z9049 Acquired absence of other specified parts of digestive tract: Secondary | ICD-10-CM | POA: Diagnosis not present

## 2016-06-02 DIAGNOSIS — E11649 Type 2 diabetes mellitus with hypoglycemia without coma: Secondary | ICD-10-CM

## 2016-06-02 DIAGNOSIS — Z86010 Personal history of colonic polyps: Secondary | ICD-10-CM | POA: Diagnosis not present

## 2016-06-02 DIAGNOSIS — Z90710 Acquired absence of both cervix and uterus: Secondary | ICD-10-CM | POA: Diagnosis not present

## 2016-06-02 DIAGNOSIS — K589 Irritable bowel syndrome without diarrhea: Secondary | ICD-10-CM | POA: Insufficient documentation

## 2016-06-02 DIAGNOSIS — Z87891 Personal history of nicotine dependence: Secondary | ICD-10-CM | POA: Insufficient documentation

## 2016-06-02 DIAGNOSIS — E559 Vitamin D deficiency, unspecified: Secondary | ICD-10-CM | POA: Diagnosis not present

## 2016-06-02 DIAGNOSIS — I10 Essential (primary) hypertension: Secondary | ICD-10-CM

## 2016-06-02 DIAGNOSIS — Z803 Family history of malignant neoplasm of breast: Secondary | ICD-10-CM | POA: Insufficient documentation

## 2016-06-02 DIAGNOSIS — J449 Chronic obstructive pulmonary disease, unspecified: Secondary | ICD-10-CM | POA: Insufficient documentation

## 2016-06-02 DIAGNOSIS — E1122 Type 2 diabetes mellitus with diabetic chronic kidney disease: Secondary | ICD-10-CM | POA: Diagnosis not present

## 2016-06-02 DIAGNOSIS — E1151 Type 2 diabetes mellitus with diabetic peripheral angiopathy without gangrene: Secondary | ICD-10-CM | POA: Insufficient documentation

## 2016-06-02 DIAGNOSIS — Z836 Family history of other diseases of the respiratory system: Secondary | ICD-10-CM | POA: Insufficient documentation

## 2016-06-02 DIAGNOSIS — Z9981 Dependence on supplemental oxygen: Secondary | ICD-10-CM | POA: Insufficient documentation

## 2016-06-02 DIAGNOSIS — E669 Obesity, unspecified: Secondary | ICD-10-CM | POA: Diagnosis not present

## 2016-06-02 DIAGNOSIS — Z885 Allergy status to narcotic agent status: Secondary | ICD-10-CM | POA: Insufficient documentation

## 2016-06-02 DIAGNOSIS — Z6841 Body Mass Index (BMI) 40.0 and over, adult: Secondary | ICD-10-CM | POA: Diagnosis not present

## 2016-06-02 DIAGNOSIS — K219 Gastro-esophageal reflux disease without esophagitis: Secondary | ICD-10-CM | POA: Diagnosis not present

## 2016-06-02 DIAGNOSIS — N189 Chronic kidney disease, unspecified: Secondary | ICD-10-CM | POA: Diagnosis not present

## 2016-06-02 DIAGNOSIS — G4733 Obstructive sleep apnea (adult) (pediatric): Secondary | ICD-10-CM | POA: Insufficient documentation

## 2016-06-02 DIAGNOSIS — Z9889 Other specified postprocedural states: Secondary | ICD-10-CM | POA: Insufficient documentation

## 2016-06-02 DIAGNOSIS — I5032 Chronic diastolic (congestive) heart failure: Secondary | ICD-10-CM | POA: Diagnosis present

## 2016-06-02 DIAGNOSIS — Z8249 Family history of ischemic heart disease and other diseases of the circulatory system: Secondary | ICD-10-CM | POA: Diagnosis not present

## 2016-06-02 DIAGNOSIS — Z794 Long term (current) use of insulin: Secondary | ICD-10-CM | POA: Insufficient documentation

## 2016-06-02 DIAGNOSIS — N179 Acute kidney failure, unspecified: Secondary | ICD-10-CM | POA: Insufficient documentation

## 2016-06-02 LAB — BASIC METABOLIC PANEL
ANION GAP: 9 (ref 5–15)
BUN: 21 mg/dL — AB (ref 6–20)
CHLORIDE: 96 mmol/L — AB (ref 101–111)
CO2: 34 mmol/L — ABNORMAL HIGH (ref 22–32)
Calcium: 9.5 mg/dL (ref 8.9–10.3)
Creatinine, Ser: 1 mg/dL (ref 0.44–1.00)
Glucose, Bld: 58 mg/dL — ABNORMAL LOW (ref 65–99)
POTASSIUM: 3.6 mmol/L (ref 3.5–5.1)
SODIUM: 139 mmol/L (ref 135–145)

## 2016-06-02 MED ORDER — TORSEMIDE 20 MG PO TABS: 40.0000 mg | ORAL_TABLET | Freq: Two times a day (BID) | ORAL | 3 refills | Status: DC

## 2016-06-02 NOTE — Telephone Encounter (Signed)
Called patient's cell phone and LM regarding lab results that were obtained today. Potassium and kidney function looks good but glucose was 58. Wanted to make sure that patient had made it home and that she had eaten something. When unable to reach patient, called both her daughters listed as emergency contacts. LM for daughter [name] [name] but was able to speak with another daughter [name] [name]. Asked [name] to make sure she checks on her mom and to emphasize how important it was to keep an eye on her glucose closely this weekend. [name] was appreciative of the call and says that she will call her mom.

## 2016-06-02 NOTE — Telephone Encounter (Signed)
Patient's other daughter, Joelene Millin, called back to discuss mom's lab results. Adan Sis to make sure that her mom eats tonight and for her to NOT take her insulin tonight. Recommend holding insulin any morning if her glucose is in the 50's. Joelene Millin says that her mom tends to eat just one meal daily because she doesn't want to fix anything.  Will be making a [facility] referral for patient.

## 2016-06-02 NOTE — Patient Instructions (Addendum)
Continue weighing daily and call for an overnight weight gain of > 2 pounds or a weekly weight gain of >5 pounds.  Stop taking the furosemide.  Begin taking torsemide 40mg  (2 tablets) twice daily for the next 3 days and then decrease it to 1 tablet twice daily.

## 2016-06-02 NOTE — Progress Notes (Signed)
Patient ID: [name] [name], female    DOB: 08-17-1955, 61 y.o.   MRN: [id number]  HPI  [name] [name] is a 61 y/o female with a history of vitamin D deficiency, obstructive sleep apnea, GERD, pneumonia, PVD, DM, depression, DJD, COPD with oxygen, allergies, past tobacco use and chronic heart failure.   Last echo was done on 05/21/16 and showed an EF of 50% along with trivial MR/TR/PR. EF has declined slightly from June 2017.  Admitted 05/20/16 due to COPD/HF exacerbation. Patient dropped about 3.1L during admission. Given IV diuretics, nebulizers, steroids and antibiotics. Not wearing CPAP and follow-up with pulmonologist was encouraged. Discharged home after 4 days with home health. Admitted 09/08/15 with acute on chronic renal failure due to dehydration and over diuresis. Given gentle IV hydration. Discharged home after 2 days with home health services.  She presents today for her initial visit with fatigue upon minimal exertion. Symptom does improve fairly quickly when she stops to rest. Denies any shortness of breath or swelling anywhere. Is already weighing daily and says that her weight has been stable. Admits to not getting any exercise but is interested in pulmonary rehab. Biggest concern today is of her low glucose level. Says that she was 55 this morning, ate breakfast, took her insulin but hasn't eaten since (it's now 1:00pm).   Past Medical History:  Diagnosis Date  . Allergic rhinitis   . Allergy   . Back pain   . COPD (chronic obstructive pulmonary disease) ([place])   . Degenerative joint disease of knee, left   . Depression   . Diabetes mellitus without complication ([place])   . Edema   . Elevated lipids   . GERD (gastroesophageal reflux disease)   . Hidradenitis   . History of colonic polyps   . Hypertension   . Hypothyroidism   . IBS (irritable bowel syndrome)   . Insomnia   . Obesity   . Oxygen dependent   . Pneumonia   . PVD (peripheral vascular disease) ([place])   . Sinusitis,  chronic   . Sleep apnea   . Thyroid disease   . Vaginitis, atrophic   . Vertigo   . Vitamin D deficiency    Past Surgical History:  Procedure Laterality Date  . ABDOMINAL HYSTERECTOMY    . AXILLARY HIDRADENITIS EXCISION    . CESAREAN SECTION     x 2  . CHOLECYSTECTOMY    . HEEL SPUR EXCISION N/A   . HYDRADENITIS EXCISION Right 12/31/2015   Procedure: EXCISION HIDRADENITIS AXILLA;  Surgeon: Clayburn Pert, MD;  Location: [facility] ORS;  Service: General;  Laterality: Right;  . TONSILLECTOMY     Family History  Problem Relation Age of Onset  . Rashes / Skin problems Father   . Hypertension Father   . Heart disease Father   . Breast cancer Paternal Grandmother   . COPD Mother    Social History  Substance Use Topics  . Smoking status: Former Smoker    Quit date: 12/23/1994  . Smokeless tobacco: Never Used  . Alcohol use No   Allergies  Allergen Reactions  . Codeine Itching    Prior to Admission medications   Medication Sig Start Date End Date Taking? Authorizing Provider  albuterol (PROVENTIL HFA;VENTOLIN HFA) 108 (90 Base) MCG/ACT inhaler Inhale 2 puffs into the lungs every 6 (six) hours as needed for wheezing or shortness of breath.   Yes Historical Provider, MD  albuterol (PROVENTIL) (2.5 MG/3ML) 0.083% nebulizer solution Take 2.5 mg by nebulization  every 6 (six) hours as needed for wheezing or shortness of breath.    Yes Historical Provider, MD  atorvastatin (LIPITOR) 20 MG tablet Take 20 mg by mouth at bedtime.    Yes Historical Provider, MD  budesonide-formoterol (SYMBICORT) 160-4.5 MCG/ACT inhaler Inhale 2 puffs into the lungs 2 (two) times daily.   Yes Historical Provider, MD  clindamycin (CLINDAGEL) 1 % gel Apply 1 application topically 2 (two) times daily.   Yes Historical Provider, MD  doxepin (SINEQUAN) 50 MG capsule Take 50 mg by mouth at bedtime.   Yes Historical Provider, MD  doxycycline (VIBRAMYCIN) 100 MG capsule Take 1 capsule (100 mg total) by mouth 2 (two)  times daily. 05/23/16  Yes Theodoro Grist, MD  DULoxetine (CYMBALTA) 20 MG capsule Take 20 mg by mouth daily.   Yes Historical Provider, MD  ferrous gluconate (FERGON) 324 MG tablet Take 1 tablet (324 mg total) by mouth 2 (two) times daily with a meal. 05/23/16  Yes Theodoro Grist, MD  fluocinonide (LIDEX) 0.05 % external solution Apply 1 application topically 2 (two) times daily.   Yes Historical Provider, MD  furosemide (LASIX) 40 MG tablet Take 40 mg by mouth 2 (two) times daily.   Yes Historical Provider, MD  gabapentin (NEURONTIN) 100 MG capsule Take 200 mg by mouth at bedtime.    Yes Historical Provider, MD  glucose blood test strip TEST three times a day 12/30/15  Yes Historical Provider, MD  HUMULIN R U-500 KWIKPEN 500 UNIT/ML injection Inject 120 Units into the skin 2 (two) times daily with a meal.    Yes Historical Provider, MD  ketoconazole (NIZORAL) 2 % shampoo Apply 1 application topically 2 (two) times a week.   Yes Historical Provider, MD  Lancets Misc. (ACCU-CHEK FASTCLIX LANCET) KIT  12/27/15  Yes Historical Provider, MD  levothyroxine (SYNTHROID, LEVOTHROID) 200 MCG tablet Take 200 mcg by mouth daily before breakfast.    Yes Historical Provider, MD  loratadine (CLARITIN) 10 MG tablet Take 10 mg by mouth daily.   Yes Historical Provider, MD  meclizine (ANTIVERT) 25 MG tablet Take 25 mg by mouth 3 (three) times daily as needed for dizziness.    Yes Historical Provider, MD  mometasone (NASONEX) 50 MCG/ACT nasal spray Place 2 sprays into both nostrils daily.    Yes Historical Provider, MD  montelukast (SINGULAIR) 10 MG tablet Take 10 mg by mouth at bedtime.    Yes Historical Provider, MD  naphazoline-glycerin (CLEAR EYES) 0.012-0.2 % SOLN Place 1-2 drops into both eyes 4 (four) times daily as needed for irritation. 05/23/16  Yes Theodoro Grist, MD  olopatadine (PATANOL) 0.1 % ophthalmic solution Place 1 drop into both eyes 2 (two) times daily.   Yes Historical Provider, MD  omeprazole (PRILOSEC)  40 MG capsule Take 40 mg by mouth daily.   Yes Historical Provider, MD  potassium chloride (K-DUR) 10 MEQ tablet Take 10 mEq by mouth daily.    Yes Historical Provider, MD  predniSONE (DELTASONE) 10 MG tablet Take 1 tablet (10 mg total) by mouth daily with breakfast. Please take 6 pills in the morning on the day 1 and 2, then taper by one pill every 2 days until finished, thank you 05/23/16  Yes Theodoro Grist, MD  TRADJENTA 5 MG TABS tablet Take 5 mg by mouth daily.    Yes Historical Provider, MD  traZODone (DESYREL) 50 MG tablet Take 50-100 mg by mouth at bedtime as needed for sleep.    Yes Historical Provider, MD  benzonatate (TESSALON) 200 MG capsule Take 1 capsule (200 mg total) by mouth 3 (three) times daily as needed for cough. Patient not taking: Reported on 06/02/2016 05/23/16   Theodoro Grist, MD  etodolac (LODINE) 400 MG tablet Take 400 mg by mouth 2 (two) times daily.    Historical Provider, MD  ondansetron (ZOFRAN) 8 MG tablet Take 4-8 mg by mouth every 8 (eight) hours as needed for nausea or vomiting.    Historical Provider, MD    Review of Systems  Constitutional: Positive for fatigue. Negative for appetite change.  HENT: Positive for congestion (when laying down flat). Negative for postnasal drip and sore throat.   Eyes: Negative.   Respiratory: Negative for chest tightness and shortness of breath.   Cardiovascular: Negative for chest pain, palpitations and leg swelling.  Gastrointestinal: Negative for abdominal distention and abdominal pain.  Endocrine: Negative.   Genitourinary: Negative.   Musculoskeletal: Negative for back pain and neck pain.  Skin: Negative.   Allergic/Immunologic: Negative.   Neurological: Positive for light-headedness. Negative for dizziness.  Hematological: Negative for adenopathy. Does not bruise/bleed easily.  Psychiatric/Behavioral: Positive for sleep disturbance (sleeping in recliner due to allergies). Negative for dysphoric mood and suicidal ideas. The  patient is not nervous/anxious.    Vitals:   06/02/16 1315  BP: (!) 110/45  Pulse: 88  Resp: 18  SpO2: 93%  Weight: 281 lb 6 oz (127.6 kg)  Height: _0  (1.6 m)   Wt Readings from Last 3 Encounters:  06/02/16 281 lb 6 oz (127.6 kg)  05/21/16 262 lb 11.2 oz (119.2 kg)  03/28/16 292 lb 6.4 oz (132.6 kg)   Lab Results  Component Value Date   CREATININE 1.00 06/02/2016   CREATININE 0.85 05/24/2016   CREATININE 0.84 05/21/2016     Physical Exam  Constitutional: She is oriented to person, place, and time. She appears well-developed and well-nourished.  HENT:  Head: Normocephalic and atraumatic.  Eyes: Conjunctivae are normal. Pupils are equal, round, and reactive to light.  Neck: Normal range of motion. Neck supple. No JVD present.  Cardiovascular: Normal rate and regular rhythm.   Pulmonary/Chest: Effort normal. She has no wheezes. She has no rales.  Abdominal: Soft. She exhibits no distension. There is no tenderness.  Musculoskeletal: She exhibits edema (2+ pitting edema in bilateral lower legs). She exhibits no tenderness.  Neurological: She is alert and oriented to person, place, and time.  Skin: Skin is warm and dry.  Psychiatric: She has a normal mood and affect. Her behavior is normal. Thought content normal.  Nursing note and vitals reviewed.    Assessment & Plan:  1: Chronic heart failure with preserved ejection fraction- - NYHA class III - moderately fluid overloaded today with 2+ pitting edema - d/c furosemide and begin torsemide 39m twice daily for the next 3 days and then decrease it to 241mtwice daily - continue weighing daily and call for an overnight weight gain of >2 pounds or a weekly weight gain of >5 pounds - not using salt but is using Mrs. Dash seasoning instead. Discussed the importance of closely following a 200039modium diet and written dietary information was given to her about this. - brochure on pulmonary rehab given to her to think about; she  can discuss with her pulmonologist who she sees on 06/06/16 - will check a BMP today - saw cardiologist (Fath) 12/28/15 and has PRN follow-up - wears oxygen at 2L when she's home but doesn't have a portable tank to  carry out  2: HTN- - BP on the low side and she does endorse some dizziness - encouraged slow position changes  3: Diabetes- - says that her morning glucose this morning was 55, she ate breakfast and took 120 units of U500 along with 36m tradjenta and hasn't eaten - checked her glucose before coming to the office and it was 59 - has been eating breath mints while in the office to get her glucose up.  - she was given crackers and candy here as well as was escorted to the lab to make sure she gets her blood drawn and that she is feeling ok before getting in the [place]- advised her she needs to call her PCP Monday morning to discuss her insulin use - she says that she's never been taught about symptoms of hypoglycemia, when she should hold her insulin, carb counting etc.  - will make a lifestyle center referral for diabetes education  Since she is seeing her pulmonologist on 06/06/16 and is calling her PCP Monday the 19th, will have patient return here in 2 weeks or sooner for any questions/problems before then.  Will call patient when I receive her lab results.

## 2016-06-04 ENCOUNTER — Encounter: Payer: Self-pay | Admitting: Family

## 2016-06-05 ENCOUNTER — Telehealth: Payer: Self-pay | Admitting: General Surgery

## 2016-06-05 NOTE — Telephone Encounter (Signed)
needs to know if she can have a mammogram now and what kind of deodorant that she can use

## 2016-06-05 NOTE — Telephone Encounter (Signed)
Returned phone call to patient at this time. I explained that she may call over to [facility] and schedule her Screening Mammogram. I encourage her to do so as she states that her incision is healed and she has not had a mammogram since 02/2015. She may wear deodorant as long as her axillary incisions are healed. She states that currently the right is healed but the left is still healing. She was told that she may wear deodorant of any type as soon as this area is completely healed. She verbalizes understanding.

## 2016-06-06 ENCOUNTER — Telehealth: Payer: Self-pay

## 2016-06-06 NOTE — Telephone Encounter (Signed)
[name] [name] is calling wanting to know if she can wear deodorant on her left side. She had breast surgery and it's not quite healed yet. I read the note that Amber posted yesterday when she talked to the patient, and restated what Amber had said. I advised the patient not to apply deodorant to that area until it is completley healed because it will cause an infection if she does. Patient verbally understood.

## 2016-06-14 ENCOUNTER — Telehealth: Payer: Self-pay | Admitting: Family

## 2016-06-14 NOTE — Telephone Encounter (Signed)
Patient called to say that she's been having some cramping in her hands/feet. Currently taking torsemide 20mg  twice daily along with potassium 52meq twice daily. Yesterday's weight was 273 pounds, day before (06/12/16) was 275 pounds and 06/10/16 weight was 277 pounds. She says that her legs still feel swollen.  Made an appointment for patient to be seen tomorrow so that labs can be checked. She said that her glucose has been running 61-101. Reminded her to eat before coming to the appointment.

## 2016-06-15 ENCOUNTER — Telehealth: Payer: Self-pay | Admitting: Family

## 2016-06-15 ENCOUNTER — Ambulatory Visit: Payer: Medicare Other | Attending: Family | Admitting: Family

## 2016-06-15 VITALS — BP 110/60 | HR 92 | Resp 20 | Ht 63.0 in | Wt 277.5 lb

## 2016-06-15 DIAGNOSIS — Z9981 Dependence on supplemental oxygen: Secondary | ICD-10-CM | POA: Diagnosis not present

## 2016-06-15 DIAGNOSIS — Z885 Allergy status to narcotic agent status: Secondary | ICD-10-CM | POA: Insufficient documentation

## 2016-06-15 DIAGNOSIS — Z794 Long term (current) use of insulin: Secondary | ICD-10-CM | POA: Diagnosis not present

## 2016-06-15 DIAGNOSIS — E559 Vitamin D deficiency, unspecified: Secondary | ICD-10-CM | POA: Diagnosis not present

## 2016-06-15 DIAGNOSIS — Z7951 Long term (current) use of inhaled steroids: Secondary | ICD-10-CM | POA: Insufficient documentation

## 2016-06-15 DIAGNOSIS — J449 Chronic obstructive pulmonary disease, unspecified: Secondary | ICD-10-CM | POA: Insufficient documentation

## 2016-06-15 DIAGNOSIS — E1122 Type 2 diabetes mellitus with diabetic chronic kidney disease: Secondary | ICD-10-CM | POA: Diagnosis not present

## 2016-06-15 DIAGNOSIS — N189 Chronic kidney disease, unspecified: Secondary | ICD-10-CM | POA: Diagnosis not present

## 2016-06-15 DIAGNOSIS — K219 Gastro-esophageal reflux disease without esophagitis: Secondary | ICD-10-CM | POA: Insufficient documentation

## 2016-06-15 DIAGNOSIS — G4733 Obstructive sleep apnea (adult) (pediatric): Secondary | ICD-10-CM | POA: Insufficient documentation

## 2016-06-15 DIAGNOSIS — Z825 Family history of asthma and other chronic lower respiratory diseases: Secondary | ICD-10-CM | POA: Insufficient documentation

## 2016-06-15 DIAGNOSIS — Z8249 Family history of ischemic heart disease and other diseases of the circulatory system: Secondary | ICD-10-CM | POA: Insufficient documentation

## 2016-06-15 DIAGNOSIS — Z87891 Personal history of nicotine dependence: Secondary | ICD-10-CM | POA: Diagnosis not present

## 2016-06-15 DIAGNOSIS — E11649 Type 2 diabetes mellitus with hypoglycemia without coma: Secondary | ICD-10-CM

## 2016-06-15 DIAGNOSIS — E1151 Type 2 diabetes mellitus with diabetic peripheral angiopathy without gangrene: Secondary | ICD-10-CM | POA: Insufficient documentation

## 2016-06-15 DIAGNOSIS — I10 Essential (primary) hypertension: Secondary | ICD-10-CM

## 2016-06-15 DIAGNOSIS — I5031 Acute diastolic (congestive) heart failure: Secondary | ICD-10-CM

## 2016-06-15 DIAGNOSIS — I5032 Chronic diastolic (congestive) heart failure: Secondary | ICD-10-CM | POA: Insufficient documentation

## 2016-06-15 DIAGNOSIS — E669 Obesity, unspecified: Secondary | ICD-10-CM | POA: Diagnosis not present

## 2016-06-15 DIAGNOSIS — E039 Hypothyroidism, unspecified: Secondary | ICD-10-CM | POA: Insufficient documentation

## 2016-06-15 DIAGNOSIS — I130 Hypertensive heart and chronic kidney disease with heart failure and stage 1 through stage 4 chronic kidney disease, or unspecified chronic kidney disease: Secondary | ICD-10-CM | POA: Insufficient documentation

## 2016-06-15 DIAGNOSIS — Z79899 Other long term (current) drug therapy: Secondary | ICD-10-CM | POA: Insufficient documentation

## 2016-06-15 DIAGNOSIS — Z6841 Body Mass Index (BMI) 40.0 and over, adult: Secondary | ICD-10-CM | POA: Diagnosis not present

## 2016-06-15 DIAGNOSIS — F329 Major depressive disorder, single episode, unspecified: Secondary | ICD-10-CM | POA: Diagnosis not present

## 2016-06-15 DIAGNOSIS — M1990 Unspecified osteoarthritis, unspecified site: Secondary | ICD-10-CM | POA: Diagnosis not present

## 2016-06-15 LAB — BASIC METABOLIC PANEL
ANION GAP: 9 (ref 5–15)
BUN: 14 mg/dL (ref 6–20)
CO2: 32 mmol/L (ref 22–32)
Calcium: 9 mg/dL (ref 8.9–10.3)
Chloride: 95 mmol/L — ABNORMAL LOW (ref 101–111)
Creatinine, Ser: 0.93 mg/dL (ref 0.44–1.00)
GFR calc non Af Amer: >60 mL/min (ref >60)
GLUCOSE: 290 mg/dL — AB (ref 65–99)
POTASSIUM: 3.2 mmol/L — AB (ref 3.5–5.1)
Sodium: 136 mmol/L (ref 135–145)

## 2016-06-15 MED ORDER — BUMETANIDE 1 MG PO TABS: 1.0000 mg | ORAL_TABLET | Freq: Every day | ORAL | 5 refills | Status: DC

## 2016-06-15 NOTE — Progress Notes (Signed)
Patient ID: [name] [name], female    DOB: December 30, 1955, 61 y.o.   MRN: [id number]  HPI  [name] [name] is a 61 y/o female with a history of vitamin D deficiency, obstructive sleep apnea, GERD, pneumonia, PVD, DM, depression, DJD, COPD with oxygen, allergies, past tobacco use and chronic heart failure.   Last echo was done on 05/21/16 and showed an EF of 50% along with trivial MR/TR/PR. EF has declined slightly from June 2017.  Admitted 05/20/16 due to COPD/HF exacerbation. Patient dropped about 3.1L during admission. Given IV diuretics, nebulizers, steroids and antibiotics. Not wearing CPAP and follow-up with pulmonologist was encouraged. Discharged home after 4 days with home health. Admitted 09/08/15 with acute on chronic renal failure due to dehydration and over diuresis. Given gentle IV hydration. Discharged home after 2 days with home health services.  She presents today for her follow-up visit with fatigue and shortness of breath upon minimal exertion. Symptoms do improve fairly quickly when she stops to rest. Is already weighing daily and says that her weight has been stable. Admits to not getting any exercise but is interested in pulmonary rehab. She developed side effects from the torsemide (she thinks) and stopped taking it 2 days ago and has developed some swelling in her legs since that time. Reports that her glucose has been better.    Past Medical History:  Diagnosis Date  . Allergic rhinitis   . Allergy   . Back pain   . COPD (chronic obstructive pulmonary disease) ([place])   . Degenerative joint disease of knee, left   . Depression   . Diabetes mellitus without complication ([place])   . Edema   . Elevated lipids   . GERD (gastroesophageal reflux disease)   . Hidradenitis   . History of colonic polyps   . Hypertension   . Hypothyroidism   . IBS (irritable bowel syndrome)   . Insomnia   . Obesity   . Oxygen dependent   . Pneumonia   . PVD (peripheral vascular disease) ([place])   .  Sinusitis, chronic   . Sleep apnea   . Thyroid disease   . Vaginitis, atrophic   . Vertigo   . Vitamin D deficiency    Past Surgical History:  Procedure Laterality Date  . ABDOMINAL HYSTERECTOMY    . AXILLARY HIDRADENITIS EXCISION    . CESAREAN SECTION     x 2  . CHOLECYSTECTOMY    . HEEL SPUR EXCISION N/A   . HYDRADENITIS EXCISION Right 12/31/2015   Procedure: EXCISION HIDRADENITIS AXILLA;  Surgeon: Clayburn Pert, MD;  Location: [facility] ORS;  Service: General;  Laterality: Right;  . TONSILLECTOMY     Family History  Problem Relation Age of Onset  . Rashes / Skin problems Father   . Hypertension Father   . Heart disease Father   . Breast cancer Paternal Grandmother   . COPD Mother    Social History  Substance Use Topics  . Smoking status: Former Smoker    Quit date: 12/23/1994  . Smokeless tobacco: Never Used  . Alcohol use No   Allergies  Allergen Reactions  . Codeine Itching   Prior to Admission medications   Medication Sig Start Date End Date Taking? Authorizing Provider  albuterol (PROVENTIL HFA;VENTOLIN HFA) 108 (90 Base) MCG/ACT inhaler Inhale 2 puffs into the lungs every 6 (six) hours as needed for wheezing or shortness of breath.   Yes Historical Provider, MD  albuterol (PROVENTIL) (2.5 MG/3ML) 0.083% nebulizer solution Take 2.5 mg  by nebulization every 6 (six) hours as needed for wheezing or shortness of breath.    Yes Historical Provider, MD  atorvastatin (LIPITOR) 20 MG tablet Take 20 mg by mouth at bedtime.    Yes Historical Provider, MD  budesonide-formoterol (SYMBICORT) 160-4.5 MCG/ACT inhaler Inhale 2 puffs into the lungs 2 (two) times daily.   Yes Historical Provider, MD  clindamycin (CLINDAGEL) 1 % gel Apply 1 application topically 2 (two) times daily.   Yes Historical Provider, MD  doxepin (SINEQUAN) 50 MG capsule Take 50 mg by mouth at bedtime.   Yes Historical Provider, MD  doxycycline (VIBRAMYCIN) 100 MG capsule Take 1 capsule (100 mg total) by mouth 2  (two) times daily. 05/23/16  Yes Theodoro Grist, MD  DULoxetine (CYMBALTA) 20 MG capsule Take 20 mg by mouth daily.   Yes Historical Provider, MD  etodolac (LODINE) 400 MG tablet Take 400 mg by mouth 2 (two) times daily.   Yes Historical Provider, MD  ferrous gluconate (FERGON) 324 MG tablet Take 1 tablet (324 mg total) by mouth 2 (two) times daily with a meal. 05/23/16  Yes Theodoro Grist, MD  fluocinonide (LIDEX) 0.05 % external solution Apply 1 application topically 2 (two) times daily.   Yes Historical Provider, MD  gabapentin (NEURONTIN) 100 MG capsule Take 200 mg by mouth at bedtime.    Yes Historical Provider, MD  glucose blood test strip TEST three times a day 12/30/15  Yes Historical Provider, MD  HUMULIN R U-500 KWIKPEN 500 UNIT/ML injection Inject 120 Units into the skin 2 (two) times daily with a meal.    Yes Historical Provider, MD  ketoconazole (NIZORAL) 2 % shampoo Apply 1 application topically 2 (two) times a week.   Yes Historical Provider, MD  Lancets Misc. (ACCU-CHEK FASTCLIX LANCET) KIT  12/27/15  Yes Historical Provider, MD  levothyroxine (SYNTHROID, LEVOTHROID) 200 MCG tablet Take 200 mcg by mouth daily before breakfast.    Yes Historical Provider, MD  loratadine (CLARITIN) 10 MG tablet Take 10 mg by mouth daily.   Yes Historical Provider, MD  meclizine (ANTIVERT) 25 MG tablet Take 25 mg by mouth 3 (three) times daily as needed for dizziness.    Yes Historical Provider, MD  mometasone (NASONEX) 50 MCG/ACT nasal spray Place 2 sprays into both nostrils daily.    Yes Historical Provider, MD  montelukast (SINGULAIR) 10 MG tablet Take 10 mg by mouth at bedtime.    Yes Historical Provider, MD  naphazoline-glycerin (CLEAR EYES) 0.012-0.2 % SOLN Place 1-2 drops into both eyes 4 (four) times daily as needed for irritation. 05/23/16  Yes Theodoro Grist, MD  olopatadine (PATANOL) 0.1 % ophthalmic solution Place 1 drop into both eyes 2 (two) times daily.   Yes Historical Provider, MD  omeprazole  (PRILOSEC) 40 MG capsule Take 40 mg by mouth daily.   Yes Historical Provider, MD  ondansetron (ZOFRAN) 8 MG tablet Take 4-8 mg by mouth every 8 (eight) hours as needed for nausea or vomiting.   Yes Historical Provider, MD  potassium chloride (K-DUR) 10 MEQ tablet Take 10 mEq by mouth twice daily.    Yes Historical Provider, MD  TRADJENTA 5 MG TABS tablet Take 5 mg by mouth daily.    Yes Historical Provider, MD  traZODone (DESYREL) 50 MG tablet Take 50-100 mg by mouth at bedtime as needed for sleep.    Yes Historical Provider, MD  benzonatate (TESSALON) 200 MG capsule Take 1 capsule (200 mg total) by mouth 3 (three) times daily as  needed for cough. Patient not taking: Reported on 06/02/2016 05/23/16   Theodoro Grist, MD  torsemide (DEMADEX) 20 MG tablet Take 2 tablets (40 mg total) by mouth 2 (two) times daily. Patient not taking: Reported on 06/15/2016 06/02/16 07/02/16  Alisa Graff, FNP    Review of Systems  Constitutional: Positive for fatigue. Negative for appetite change.  HENT: Negative for congestion, postnasal drip and tinnitus.   Eyes: Negative.   Respiratory: Positive for cough and shortness of breath. Negative for chest tightness.   Cardiovascular: Positive for leg swelling. Negative for chest pain.  Gastrointestinal: Negative for abdominal distention, abdominal pain and nausea.  Endocrine: Negative.   Genitourinary: Negative.   Musculoskeletal: Negative for back pain, myalgias and neck pain.  Skin: Negative.   Allergic/Immunologic: Negative.   Neurological: Positive for dizziness (better ) and headaches (migraine a couple of days ago). Negative for light-headedness.  Hematological: Negative for adenopathy. Does not bruise/bleed easily.  Psychiatric/Behavioral: Negative for dysphoric mood, sleep disturbance (sleeping in recliner with oxygen at 2L) and suicidal ideas. The patient is not nervous/anxious.    Vitals:   06/15/16 1038  BP: 110/60  Pulse: 92  Resp: 20  SpO2: 94%   Weight: 277 lb 8 oz (125.9 kg)  Height: _0  (1.6 m)   Wt Readings from Last 3 Encounters:  06/15/16 277 lb 8 oz (125.9 kg)  06/02/16 281 lb 6 oz (127.6 kg)  05/21/16 262 lb 11.2 oz (119.2 kg)   Lab Results  Component Value Date   CREATININE 0.93 06/15/2016   CREATININE 1.00 06/02/2016   CREATININE 0.85 05/24/2016    Physical Exam  Constitutional: She is oriented to person, place, and time. She appears well-developed and well-nourished.  HENT:  Head: Normocephalic and atraumatic.  Eyes: Conjunctivae are normal. Pupils are equal, round, and reactive to light.  Neck: Normal range of motion. Neck supple. No JVD present.  Cardiovascular: Normal rate and regular rhythm.   Pulmonary/Chest: Effort normal. She has no wheezes. She has no rales.  Abdominal: Soft. She exhibits no distension. There is no tenderness.  Musculoskeletal: She exhibits edema (1+ pitting edema in bilateral lower legs). She exhibits no tenderness.  Neurological: She is alert and oriented to person, place, and time.  Skin: Skin is warm and dry.  Psychiatric: She has a normal mood and affect. Her behavior is normal. Thought content normal.  Nursing note and vitals reviewed.   Assessment & Plan:  1: Chronic heart failure with preserved ejection fraction- - NYHA class III - mildly fluid overloaded today with 1+ pitting edema - she stopped torsemide due to side effects that she was experiencing. Discussed that I thought it was more likely that her potassium level was low but she really doesn't want to resume torsemide and wants it added to her allergies - added torsemide to her allergies and will try bumex 73m daily instead. She's currently taking 139m potassium twice daily.  - continue weighing daily and call for an overnight weight gain of >2 pounds or a weekly weight gain of >5 pounds - not using salt but is using Mrs. Dash seasoning instead.  - will check a BMP today - saw cardiologist (Fath) 12/28/15 and has  PRN follow-up - wears oxygen at 2L when she's home but doesn't have a portable tank to carry out  2: HTN- - BP looks good today - encouraged slow position changes  3: Diabetes- - says that her morning glucose this morning was 162 - she says that  she's never been taught about symptoms of hypoglycemia, when she should hold her insulin, carb counting etc.  - faxed a referral to [facility] last week but they needed a referral placed in [facility] which was done today  Patient did not bring her medications nor a list. Each medication was verbally reviewed with the patient and she was encouraged to bring the bottles to every visit to confirm accuracy of list.  Return next week for a recheck of symptoms and for a recheck of her lab work.

## 2016-06-15 NOTE — Patient Instructions (Signed)
Continue weighing daily and call for an overnight weight gain of > 2 pounds or a weekly weight gain of >5 pounds. 

## 2016-06-15 NOTE — Telephone Encounter (Signed)
Spoke with patient regarding her lab results obtained today (06/15/16). Advised patient that her potassium was low at 3.2 bu that her renal function looked good. Advised her to increase her potassium to 33meq twice daily (was previously 26meq twice daily). Will recheck her labs next week. Patient verbalized understanding.

## 2016-06-16 ENCOUNTER — Encounter: Payer: Self-pay | Admitting: Family

## 2016-06-19 ENCOUNTER — Ambulatory Visit: Payer: Medicaid Other | Admitting: Family

## 2016-06-19 ENCOUNTER — Telehealth: Payer: Self-pay

## 2016-06-19 NOTE — Telephone Encounter (Signed)
Spoke with patient regarding her increased weight and swelling in her legs/abdomen. Had changed her diuretic to bumex because she thought the cramping was due to the torsemide. She is now taking potassium twice daily and denies any further cramping.   She seemed to do better on the torsemide and she is willing to try taking it again. Instructed her to start off with 40mg  in the morning and if she needs additional torsemide, she can take an additional 40mg  in the afternoon.   Currently has an appointment scheduled 06/22/16 and she's to keep that appointment so that we can check her lab work again. She is to call back sooner if the torsemide doesn't work.

## 2016-06-19 NOTE — Telephone Encounter (Signed)
[name] [name] called to report an increase in weight gain. She states that her weight has been as follows.    274 lbs 06/13/2016 273 lbs 06/14/2016 275 lbs 06/15/2016 277 lbs 06/16/2016 278 lbs 06/17/2016 279 lbs 06/18/2016 284 lb 06/19/2016  She does note increase shortness of breath, and edema in her legs and feet. Please advise.

## 2016-06-22 ENCOUNTER — Ambulatory Visit: Payer: Medicare Other | Attending: Family | Admitting: Family

## 2016-06-22 ENCOUNTER — Encounter: Payer: Self-pay | Admitting: Family

## 2016-06-22 VITALS — BP 142/70 | HR 97 | Resp 20 | Ht 63.0 in | Wt 288.5 lb

## 2016-06-22 DIAGNOSIS — E559 Vitamin D deficiency, unspecified: Secondary | ICD-10-CM | POA: Diagnosis not present

## 2016-06-22 DIAGNOSIS — Z885 Allergy status to narcotic agent status: Secondary | ICD-10-CM | POA: Insufficient documentation

## 2016-06-22 DIAGNOSIS — Z8249 Family history of ischemic heart disease and other diseases of the circulatory system: Secondary | ICD-10-CM | POA: Insufficient documentation

## 2016-06-22 DIAGNOSIS — K589 Irritable bowel syndrome without diarrhea: Secondary | ICD-10-CM | POA: Insufficient documentation

## 2016-06-22 DIAGNOSIS — J449 Chronic obstructive pulmonary disease, unspecified: Secondary | ICD-10-CM | POA: Diagnosis not present

## 2016-06-22 DIAGNOSIS — Z9049 Acquired absence of other specified parts of digestive tract: Secondary | ICD-10-CM | POA: Insufficient documentation

## 2016-06-22 DIAGNOSIS — E11649 Type 2 diabetes mellitus with hypoglycemia without coma: Secondary | ICD-10-CM

## 2016-06-22 DIAGNOSIS — Z86010 Personal history of colonic polyps: Secondary | ICD-10-CM | POA: Diagnosis not present

## 2016-06-22 DIAGNOSIS — I10 Essential (primary) hypertension: Secondary | ICD-10-CM

## 2016-06-22 DIAGNOSIS — N189 Chronic kidney disease, unspecified: Secondary | ICD-10-CM | POA: Insufficient documentation

## 2016-06-22 DIAGNOSIS — Z87891 Personal history of nicotine dependence: Secondary | ICD-10-CM | POA: Insufficient documentation

## 2016-06-22 DIAGNOSIS — E1122 Type 2 diabetes mellitus with diabetic chronic kidney disease: Secondary | ICD-10-CM | POA: Insufficient documentation

## 2016-06-22 DIAGNOSIS — F329 Major depressive disorder, single episode, unspecified: Secondary | ICD-10-CM | POA: Diagnosis not present

## 2016-06-22 DIAGNOSIS — I5032 Chronic diastolic (congestive) heart failure: Secondary | ICD-10-CM

## 2016-06-22 DIAGNOSIS — Z90710 Acquired absence of both cervix and uterus: Secondary | ICD-10-CM | POA: Insufficient documentation

## 2016-06-22 DIAGNOSIS — Z803 Family history of malignant neoplasm of breast: Secondary | ICD-10-CM | POA: Insufficient documentation

## 2016-06-22 DIAGNOSIS — N179 Acute kidney failure, unspecified: Secondary | ICD-10-CM | POA: Diagnosis not present

## 2016-06-22 DIAGNOSIS — K219 Gastro-esophageal reflux disease without esophagitis: Secondary | ICD-10-CM | POA: Insufficient documentation

## 2016-06-22 DIAGNOSIS — E669 Obesity, unspecified: Secondary | ICD-10-CM | POA: Diagnosis not present

## 2016-06-22 DIAGNOSIS — Z9981 Dependence on supplemental oxygen: Secondary | ICD-10-CM | POA: Diagnosis not present

## 2016-06-22 DIAGNOSIS — Z794 Long term (current) use of insulin: Secondary | ICD-10-CM

## 2016-06-22 DIAGNOSIS — E1151 Type 2 diabetes mellitus with diabetic peripheral angiopathy without gangrene: Secondary | ICD-10-CM | POA: Insufficient documentation

## 2016-06-22 DIAGNOSIS — I130 Hypertensive heart and chronic kidney disease with heart failure and stage 1 through stage 4 chronic kidney disease, or unspecified chronic kidney disease: Secondary | ICD-10-CM | POA: Diagnosis not present

## 2016-06-22 DIAGNOSIS — Z836 Family history of other diseases of the respiratory system: Secondary | ICD-10-CM | POA: Insufficient documentation

## 2016-06-22 DIAGNOSIS — E039 Hypothyroidism, unspecified: Secondary | ICD-10-CM | POA: Diagnosis not present

## 2016-06-22 DIAGNOSIS — G4733 Obstructive sleep apnea (adult) (pediatric): Secondary | ICD-10-CM | POA: Diagnosis not present

## 2016-06-22 LAB — BASIC METABOLIC PANEL
Anion gap: 7 (ref 5–15)
BUN: 9 mg/dL (ref 6–20)
CALCIUM: 9.2 mg/dL (ref 8.9–10.3)
CO2: 32 mmol/L (ref 22–32)
CREATININE: 0.91 mg/dL (ref 0.44–1.00)
Chloride: 100 mmol/L — ABNORMAL LOW (ref 101–111)
GFR calc Af Amer: >60 mL/min (ref >60)
Glucose, Bld: 171 mg/dL — ABNORMAL HIGH (ref 65–99)
POTASSIUM: 3.7 mmol/L (ref 3.5–5.1)
SODIUM: 139 mmol/L (ref 135–145)

## 2016-06-22 MED ORDER — METOLAZONE 5 MG PO TABS: 5.0000 mg | ORAL_TABLET | Freq: Every day | ORAL | 0 refills | Status: DC

## 2016-06-22 NOTE — Patient Instructions (Addendum)
Continue weighing daily and call for an overnight weight gain of > 2 pounds or a weekly weight gain of >5 pounds.  Take metolazone 5mg  daily for the next 3 days. Best to take it 1/2 hour prior to morning dose of torsemide if possible.

## 2016-06-22 NOTE — Progress Notes (Signed)
 Patient ID: [name] [name], female    DOB: 05/10/1955, 60 y.o.   MRN: [id number]  HPI  [name] [name] is a 60 y/o female with a history of vitamin D deficiency, obstructive sleep apnea, GERD, pneumonia, PVD, DM, depression, DJD, COPD with oxygen, allergies, past tobacco use and chronic heart failure.   Last echo was done on 05/21/16 and showed an EF of 50% along with trivial MR/TR/PR. EF has declined slightly from June 2017.  Admitted 05/20/16 due to COPD/HF exacerbation. Patient dropped about 3.1L during admission. Given IV diuretics, nebulizers, steroids and antibiotics. Not wearing CPAP and follow-up with pulmonologist was encouraged. Discharged home after 4 days with home health. Admitted 09/08/15 with acute on chronic renal failure due to dehydration and over diuresis. Given gentle IV hydration. Discharged home after 2 days with home health services.  She presents today for her follow-up visit with fatigue and shortness of breath upon minimal exertion. Symptoms do improve fairly quickly when she stops to rest. Is weighing daily and says that her weight has increased with a 4 pound weight gain overnight. She has tolerated the resumption in torsemide as her potassium level has been corrected so no further cramping at this time. Says that she's not adding any additional salt to her food. Recent glucose was 126.  Past Medical History:  Diagnosis Date  . Allergic rhinitis   . Allergy   . Back pain   . COPD (chronic obstructive pulmonary disease) (HCC)   . Degenerative joint disease of knee, left   . Depression   . Diabetes mellitus without complication (HCC)   . Edema   . Elevated lipids   . GERD (gastroesophageal reflux disease)   . Hidradenitis   . History of colonic polyps   . Hypertension   . Hypothyroidism   . IBS (irritable bowel syndrome)   . Insomnia   . Obesity   . Oxygen dependent   . Pneumonia   . PVD (peripheral vascular disease) (HCC)   . Sinusitis, chronic   . Sleep apnea   .  Thyroid disease   . Vaginitis, atrophic   . Vertigo   . Vitamin D deficiency    Past Surgical History:  Procedure Laterality Date  . ABDOMINAL HYSTERECTOMY    . AXILLARY HIDRADENITIS EXCISION    . CESAREAN SECTION     x 2  . CHOLECYSTECTOMY    . HEEL SPUR EXCISION N/A   . HYDRADENITIS EXCISION Right 12/31/2015   Procedure: EXCISION HIDRADENITIS AXILLA;  Surgeon: Charles Woodham, MD;  Location: [facility] ORS;  Service: General;  Laterality: Right;  . TONSILLECTOMY     Family History  Problem Relation Age of Onset  . Rashes / Skin problems Father   . Hypertension Father   . Heart disease Father   . Breast cancer Paternal Grandmother   . COPD Mother    Social History  Substance Use Topics  . Smoking status: Former Smoker    Quit date: 12/23/1994  . Smokeless tobacco: Never Used  . Alcohol use No   Allergies  Allergen Reactions  . Codeine Itching   Prior to Admission medications   Medication Sig Start Date End Date Taking? Authorizing Provider  albuterol (PROVENTIL HFA;VENTOLIN HFA) 108 (90 Base) MCG/ACT inhaler Inhale 2 puffs into the lungs every 6 (six) hours as needed for wheezing or shortness of breath.   Yes Historical Provider, MD  albuterol (PROVENTIL) (2.5 MG/3ML) 0.083% nebulizer solution Take 2.5 mg by nebulization every 6 (six) hours as   needed for wheezing or shortness of breath.    Yes Historical Provider, MD  atorvastatin (LIPITOR) 20 MG tablet Take 20 mg by mouth at bedtime.    Yes Historical Provider, MD  budesonide-formoterol (SYMBICORT) 160-4.5 MCG/ACT inhaler Inhale 2 puffs into the lungs 2 (two) times daily.   Yes Historical Provider, MD  clindamycin (CLINDAGEL) 1 % gel Apply 1 application topically 2 (two) times daily.   Yes Historical Provider, MD  doxepin (SINEQUAN) 50 MG capsule Take 50 mg by mouth at bedtime.   Yes Historical Provider, MD  doxycycline (VIBRAMYCIN) 100 MG capsule Take 1 capsule (100 mg total) by mouth 2 (two) times daily. 05/23/16  Yes Rima  Vaickute, MD  DULoxetine (CYMBALTA) 20 MG capsule Take 20 mg by mouth daily.   Yes Historical Provider, MD  etodolac (LODINE) 400 MG tablet Take 400 mg by mouth 2 (two) times daily.   Yes Historical Provider, MD  ferrous gluconate (FERGON) 324 MG tablet Take 1 tablet (324 mg total) by mouth 2 (two) times daily with a meal. 05/23/16  Yes Rima Vaickute, MD  fluocinonide (LIDEX) 0.05 % external solution Apply 1 application topically 2 (two) times daily.   Yes Historical Provider, MD  gabapentin (NEURONTIN) 100 MG capsule Take 200 mg by mouth at bedtime.    Yes Historical Provider, MD  glucose blood test strip TEST three times a day 12/30/15  Yes Historical Provider, MD  HUMULIN R U-500 KWIKPEN 500 UNIT/ML injection Inject 120 Units into the skin 2 (two) times daily with a meal.    Yes Historical Provider, MD  ketoconazole (NIZORAL) 2 % shampoo Apply 1 application topically 2 (two) times a week.   Yes Historical Provider, MD  Lancets Misc. (ACCU-CHEK FASTCLIX LANCET) KIT  12/27/15  Yes Historical Provider, MD  levothyroxine (SYNTHROID, LEVOTHROID) 200 MCG tablet Take 200 mcg by mouth daily before breakfast.    Yes Historical Provider, MD  loratadine (CLARITIN) 10 MG tablet Take 10 mg by mouth daily.   Yes Historical Provider, MD  meclizine (ANTIVERT) 25 MG tablet Take 25 mg by mouth 3 (three) times daily as needed for dizziness.    Yes Historical Provider, MD  mometasone (NASONEX) 50 MCG/ACT nasal spray Place 2 sprays into both nostrils daily.    Yes Historical Provider, MD  montelukast (SINGULAIR) 10 MG tablet Take 10 mg by mouth at bedtime.    Yes Historical Provider, MD  naphazoline-glycerin (CLEAR EYES) 0.012-0.2 % SOLN Place 1-2 drops into both eyes 4 (four) times daily as needed for irritation. 05/23/16  Yes Rima Vaickute, MD  olopatadine (PATANOL) 0.1 % ophthalmic solution Place 1 drop into both eyes 2 (two) times daily.   Yes Historical Provider, MD  omeprazole (PRILOSEC) 40 MG capsule Take 40 mg by  mouth daily.   Yes Historical Provider, MD  ondansetron (ZOFRAN) 8 MG tablet Take 4-8 mg by mouth every 8 (eight) hours as needed for nausea or vomiting.   Yes Historical Provider, MD  potassium chloride (K-DUR) 10 MEQ tablet Take 20 mEq by mouth 2 (two) times daily.    Yes Historical Provider, MD  torsemide (DEMADEX) 20 MG tablet Take 40 mg by mouth 2 (two) times daily. 06/19/16  Yes Historical Provider, MD  TRADJENTA 5 MG TABS tablet Take 5 mg by mouth daily.    Yes Historical Provider, MD  traZODone (DESYREL) 50 MG tablet Take 50-100 mg by mouth at bedtime as needed for sleep.    Yes Historical Provider, MD  metolazone (ZAROXOLYN)   5 MG tablet Take 1 tablet (5 mg total) by mouth daily. 06/22/16 09/20/16  Alisa Graff, FNP  potassium chloride (K-DUR) 10 MEQ tablet Take 2 tablets (20 mEq total) by mouth 2 (two) times daily. Need to take additional 49mq daily while taking the metolazone 06/23/16 10/31/16  TAlisa Graff FNP    Review of Systems  Constitutional: Positive for fatigue. Negative for appetite change.  HENT: Positive for sinus pain (using flonase). Negative for congestion and sore throat.   Eyes: Negative.   Respiratory: Positive for shortness of breath. Negative for chest tightness and wheezing.   Cardiovascular: Positive for leg swelling. Negative for chest pain and palpitations.  Gastrointestinal: Positive for abdominal distention. Negative for abdominal pain.  Endocrine: Negative.   Genitourinary: Negative.   Musculoskeletal: Negative for back pain and neck pain.  Skin: Negative.   Allergic/Immunologic: Negative.   Neurological: Positive for headaches. Negative for dizziness and light-headedness.  Hematological: Negative for adenopathy. Does not bruise/bleed easily.  Psychiatric/Behavioral: Negative for dysphoric mood, sleep disturbance and suicidal ideas. The patient is not nervous/anxious.    Vitals:   06/22/16 1321  BP: (!) 142/70  Pulse: 97  Resp: 20  SpO2: 93%  Weight:  288 lb 8 oz (130.9 kg)  Height: 5' 3" (1.6 m)   Wt Readings from Last 3 Encounters:  06/22/16 288 lb 8 oz (130.9 kg)  06/15/16 277 lb 8 oz (125.9 kg)  06/02/16 281 lb 6 oz (127.6 kg)    Physical Exam  Constitutional: She is oriented to person, place, and time. She appears well-developed and well-nourished.  HENT:  Head: Normocephalic and atraumatic.  Eyes: Conjunctivae are normal. Pupils are equal, round, and reactive to light.  Neck: Normal range of motion. Neck supple. JVD present.  Cardiovascular: Regular rhythm.  Tachycardia present.   Pulmonary/Chest: Effort normal. She has no wheezes. She has no rales.  Abdominal: She exhibits distension. There is no tenderness.  Musculoskeletal: She exhibits edema (2+ pitting edema in bilateral lower legs). She exhibits no tenderness.  Neurological: She is alert and oriented to person, place, and time.  Skin: Skin is warm and dry.  Psychiatric: She has a normal mood and affect. Her behavior is normal. Thought content normal.  Nursing note and vitals reviewed.   Assessment & Plan:  1: Chronic heart failure with preserved ejection fraction- - NYHA class III - moderately fluid overloaded today with 2+ pitting edema - she has resumed torsemide without further cramping so it was removed from her allergies - will add metolazone 549mdaily for the next 3 days wit the addition of 2024mpotassium for those 3 days as well.  - continue weighing daily and call for an overnight weight gain of >2 pounds or a weekly weight gain of >5 pounds - not using salt but is using Mrs. Dash seasoning instead.  - will check a BMP today - saw cardiologist (Fath) 12/28/15 and has PRN follow-up - wears oxygen at 2L when she's home but doesn't have a portable tank to carry out  2: HTN- - BP looks good today  3: Diabetes- - says that her morning glucose this morning was 126 - she's currently waiting on [facility] to call her  Patient did not bring her  medications nor a list. Each medication was verbally reviewed with the patient and she was encouraged to bring the bottles to every visit to confirm accuracy of list.   Return next week for a recheck of symptoms and lab work.

## 2016-06-23 ENCOUNTER — Ambulatory Visit: Payer: Medicaid Other | Admitting: *Deleted

## 2016-06-23 ENCOUNTER — Telehealth: Payer: Self-pay | Admitting: Family

## 2016-06-23 MED ORDER — POTASSIUM CHLORIDE ER 10 MEQ PO TBCR: 20.0000 meq | EXTENDED_RELEASE_TABLET | Freq: Two times a day (BID) | ORAL | 3 refills | Status: DC

## 2016-06-23 NOTE — Telephone Encounter (Signed)
Reviewed lab results with patient. Potassium is now normal and renal function looks good. Advised patient, however, that with these next 3 days of taking the metolazone that she should add 2 additional potassium tablets for these next 3 days (additional 50meq). After the 3 days, she can resume her potassium at 63meq twice daily.   Patient verbalized understanding. Will recheck her labs next week.

## 2016-06-28 ENCOUNTER — Ambulatory Visit: Payer: Medicare Other | Admitting: Family

## 2016-07-04 ENCOUNTER — Ambulatory Visit: Payer: Medicare Other | Admitting: Family

## 2016-07-10 ENCOUNTER — Ambulatory Visit: Payer: Medicare Other | Admitting: Family

## 2016-07-25 ENCOUNTER — Ambulatory Visit: Payer: Medicare Other | Admitting: Dietician

## 2016-07-28 ENCOUNTER — Telehealth: Payer: Self-pay | Admitting: Family

## 2016-07-28 ENCOUNTER — Ambulatory Visit: Payer: Medicare Other | Admitting: Family

## 2016-07-28 NOTE — Telephone Encounter (Signed)
Patient did not show for her [facility] appointment on 07/28/16. Will attempt to reschedule.

## 2016-08-16 ENCOUNTER — Ambulatory Visit
Admission: RE | Admit: 2016-08-16 | Discharge: 2016-08-16 | Disposition: A | Discharge status: Home / Self Care | Payer: Medicare Other | Source: Ambulatory Visit | Attending: Family Medicine | Admitting: Family Medicine

## 2016-08-16 ENCOUNTER — Other Ambulatory Visit: Payer: Self-pay | Admitting: Family Medicine

## 2016-08-16 DIAGNOSIS — I5032 Chronic diastolic (congestive) heart failure: Secondary | ICD-10-CM | POA: Diagnosis present

## 2016-08-16 DIAGNOSIS — J449 Chronic obstructive pulmonary disease, unspecified: Secondary | ICD-10-CM | POA: Insufficient documentation

## 2016-08-17 ENCOUNTER — Ambulatory Visit: Payer: Medicare Other | Admitting: Family

## 2016-08-17 ENCOUNTER — Telehealth: Payer: Self-pay

## 2016-08-17 MED ORDER — DOXYCYCLINE HYCLATE 100 MG PO CAPS: 100.0000 mg | ORAL_CAPSULE | Freq: Two times a day (BID) | ORAL | 0 refills | Status: DC

## 2016-08-17 NOTE — Addendum Note (Signed)
Addended by: Phillips Odor on: 08/17/2016 04:31 PM   Modules accepted: Orders

## 2016-08-17 NOTE — Telephone Encounter (Signed)
Patient is calling because she is leaking on her breast, groin, and armpit. She states that it looks infected. She has been seen for hidradenitis by Dr. Adonis Huguenin but he doesn't have anything open soon enough. I went ahaead and put her on the schedule for 08/21/16 with Dr. Burt Knack

## 2016-08-17 NOTE — Telephone Encounter (Signed)
Spoke with Dr. Adonis Huguenin at this time. He would like for [name] [name] to continue with appointment to see Dr. Burt Knack on Monday but would like to start [name] [name] on Doxycycline 100mg  BID x 10 days.  Order has been placed. Medication sent to [facility] on [address].  Call made to [name] [name]. Explained all information above. [name] [name] verbalizes understanding and will begin antibiotics today.

## 2016-08-18 ENCOUNTER — Encounter: Payer: Self-pay | Admitting: Family

## 2016-08-18 ENCOUNTER — Ambulatory Visit: Payer: Medicare Other | Attending: Family | Admitting: Family

## 2016-08-18 ENCOUNTER — Encounter: Payer: Self-pay | Admitting: Podiatry

## 2016-08-18 ENCOUNTER — Ambulatory Visit (INDEPENDENT_AMBULATORY_CARE_PROVIDER_SITE_OTHER): Payer: Medicare Other | Admitting: Podiatry

## 2016-08-18 VITALS — BP 110/80 | HR 88 | Resp 20 | Ht 63.0 in | Wt 300.4 lb

## 2016-08-18 DIAGNOSIS — K589 Irritable bowel syndrome without diarrhea: Secondary | ICD-10-CM | POA: Diagnosis not present

## 2016-08-18 DIAGNOSIS — Z794 Long term (current) use of insulin: Secondary | ICD-10-CM | POA: Diagnosis not present

## 2016-08-18 DIAGNOSIS — I5032 Chronic diastolic (congestive) heart failure: Secondary | ICD-10-CM | POA: Insufficient documentation

## 2016-08-18 DIAGNOSIS — J449 Chronic obstructive pulmonary disease, unspecified: Secondary | ICD-10-CM | POA: Diagnosis not present

## 2016-08-18 DIAGNOSIS — E1151 Type 2 diabetes mellitus with diabetic peripheral angiopathy without gangrene: Secondary | ICD-10-CM | POA: Insufficient documentation

## 2016-08-18 DIAGNOSIS — Z8249 Family history of ischemic heart disease and other diseases of the circulatory system: Secondary | ICD-10-CM | POA: Diagnosis not present

## 2016-08-18 DIAGNOSIS — Z90710 Acquired absence of both cervix and uterus: Secondary | ICD-10-CM | POA: Insufficient documentation

## 2016-08-18 DIAGNOSIS — Z9981 Dependence on supplemental oxygen: Secondary | ICD-10-CM | POA: Insufficient documentation

## 2016-08-18 DIAGNOSIS — N179 Acute kidney failure, unspecified: Secondary | ICD-10-CM | POA: Insufficient documentation

## 2016-08-18 DIAGNOSIS — Z836 Family history of other diseases of the respiratory system: Secondary | ICD-10-CM | POA: Insufficient documentation

## 2016-08-18 DIAGNOSIS — Z803 Family history of malignant neoplasm of breast: Secondary | ICD-10-CM | POA: Insufficient documentation

## 2016-08-18 DIAGNOSIS — F329 Major depressive disorder, single episode, unspecified: Secondary | ICD-10-CM | POA: Diagnosis not present

## 2016-08-18 DIAGNOSIS — E1122 Type 2 diabetes mellitus with diabetic chronic kidney disease: Secondary | ICD-10-CM | POA: Diagnosis not present

## 2016-08-18 DIAGNOSIS — B351 Tinea unguium: Secondary | ICD-10-CM | POA: Diagnosis not present

## 2016-08-18 DIAGNOSIS — Z885 Allergy status to narcotic agent status: Secondary | ICD-10-CM | POA: Diagnosis not present

## 2016-08-18 DIAGNOSIS — E039 Hypothyroidism, unspecified: Secondary | ICD-10-CM | POA: Diagnosis not present

## 2016-08-18 DIAGNOSIS — G4733 Obstructive sleep apnea (adult) (pediatric): Secondary | ICD-10-CM | POA: Diagnosis not present

## 2016-08-18 DIAGNOSIS — Z87891 Personal history of nicotine dependence: Secondary | ICD-10-CM | POA: Diagnosis not present

## 2016-08-18 DIAGNOSIS — N189 Chronic kidney disease, unspecified: Secondary | ICD-10-CM | POA: Insufficient documentation

## 2016-08-18 DIAGNOSIS — I10 Essential (primary) hypertension: Secondary | ICD-10-CM

## 2016-08-18 DIAGNOSIS — I130 Hypertensive heart and chronic kidney disease with heart failure and stage 1 through stage 4 chronic kidney disease, or unspecified chronic kidney disease: Secondary | ICD-10-CM | POA: Diagnosis not present

## 2016-08-18 DIAGNOSIS — E559 Vitamin D deficiency, unspecified: Secondary | ICD-10-CM | POA: Insufficient documentation

## 2016-08-18 DIAGNOSIS — Z9049 Acquired absence of other specified parts of digestive tract: Secondary | ICD-10-CM | POA: Diagnosis not present

## 2016-08-18 DIAGNOSIS — Z86010 Personal history of colonic polyps: Secondary | ICD-10-CM | POA: Diagnosis not present

## 2016-08-18 DIAGNOSIS — M79676 Pain in unspecified toe(s): Secondary | ICD-10-CM

## 2016-08-18 DIAGNOSIS — K219 Gastro-esophageal reflux disease without esophagitis: Secondary | ICD-10-CM | POA: Diagnosis not present

## 2016-08-18 DIAGNOSIS — I5033 Acute on chronic diastolic (congestive) heart failure: Secondary | ICD-10-CM

## 2016-08-18 DIAGNOSIS — E0842 Diabetes mellitus due to underlying condition with diabetic polyneuropathy: Secondary | ICD-10-CM | POA: Diagnosis not present

## 2016-08-18 DIAGNOSIS — E119 Type 2 diabetes mellitus without complications: Secondary | ICD-10-CM

## 2016-08-18 MED ORDER — METOLAZONE 2.5 MG PO TABS: 2.5000 mg | ORAL_TABLET | Freq: Every day | ORAL | 0 refills | Status: DC

## 2016-08-18 NOTE — Patient Instructions (Addendum)
Resume weighing daily and call for an overnight weight gain of > 2 pounds or a weekly weight gain of >5 pounds.   Adding metolazone (booster fluid pill) daily for the next 3 days. Take it 1/2 hour prior to torsemide dose. While taking the metolazone, take an extra potassium pill for the next 3 days as well.   Monitor sodium intake carefully

## 2016-08-18 NOTE — Progress Notes (Signed)
Patient ID: [name] [name], female    DOB: [birth year], 61 y.o.   MRN: [id number]  HPI  [name] [name] is a 61 y/o female with a history of vitamin D deficiency, obstructive sleep apnea, GERD, pneumonia, PVD, DM, depression, DJD, COPD with oxygen, allergies, past tobacco use and chronic heart failure.   Last echo was done on 05/21/16 and showed an EF of 50% along with trivial MR/TR/PR. EF has declined slightly from June 2017.  Admitted 05/20/16 due to COPD/HF exacerbation. Patient dropped about 3.1L during admission. Given IV diuretics, nebulizers, steroids and antibiotics. Not wearing CPAP and follow-up with pulmonologist was encouraged. Discharged home after 4 days with home health. Admitted 09/08/15 with acute on chronic renal failure due to dehydration and over diuresis. Given gentle IV hydration. Discharged home after 2 days with home health services.  She presents today for her follow-up visit with a chief complaint of moderate shortness of breath with little exertion. She describes this as chronic in nature with waxing and waning of severity. She says that her breathing has worsened over the last few weeks though. She has associated fatigue, pedal edema and weight gain along with this.   Past Medical History:  Diagnosis Date  . Allergic rhinitis   . Allergy   . Back pain   . COPD (chronic obstructive pulmonary disease) ([place])   . Degenerative joint disease of knee, left   . Depression   . Diabetes mellitus without complication ([place])   . Edema   . Elevated lipids   . GERD (gastroesophageal reflux disease)   . Hidradenitis   . History of colonic polyps   . Hypertension   . Hypothyroidism   . IBS (irritable bowel syndrome)   . Insomnia   . Obesity   . Oxygen dependent   . Pneumonia   . PVD (peripheral vascular disease) ([place])   . Sinusitis, chronic   . Sleep apnea   . Thyroid disease   . Vaginitis, atrophic   . Vertigo   . Vitamin D deficiency    Past Surgical History:  Procedure  Laterality Date  . ABDOMINAL HYSTERECTOMY    . AXILLARY HIDRADENITIS EXCISION    . CESAREAN SECTION     x 2  . CHOLECYSTECTOMY    . HEEL SPUR EXCISION N/A   . HYDRADENITIS EXCISION Right 12/31/2015   Procedure: EXCISION HIDRADENITIS AXILLA;  Surgeon: Clayburn Pert, MD;  Location: [facility] ORS;  Service: General;  Laterality: Right;  . TONSILLECTOMY     Family History  Problem Relation Age of Onset  . Rashes / Skin problems Father   . Hypertension Father   . Heart disease Father   . Breast cancer Paternal Grandmother   . COPD Mother    Social History  Substance Use Topics  . Smoking status: Former Smoker    Quit date: 12/23/1994  . Smokeless tobacco: Never Used  . Alcohol use No   Allergies  Allergen Reactions  . Codeine Itching   Prior to Admission medications   Medication Sig Start Date End Date Taking? Authorizing Provider  albuterol (PROVENTIL HFA;VENTOLIN HFA) 108 (90 Base) MCG/ACT inhaler Inhale 2 puffs into the lungs every 6 (six) hours as needed for wheezing or shortness of breath.   Yes [provider]  albuterol (PROVENTIL) (2.5 MG/3ML) 0.083% nebulizer solution Take 2.5 mg by nebulization every 6 (six) hours as needed for wheezing or shortness of breath.    Yes [provider]  atorvastatin (LIPITOR) 20 MG tablet Take  20 mg by mouth at bedtime.    Yes [provider]  budesonide-formoterol (SYMBICORT) 160-4.5 MCG/ACT inhaler Inhale 2 puffs into the lungs 2 (two) times daily.   Yes [provider]  clindamycin (CLINDAGEL) 1 % gel Apply 1 application topically 2 (two) times daily.   Yes [provider]  doxepin (SINEQUAN) 50 MG capsule Take 50 mg by mouth at bedtime.   Yes [provider]  doxycycline (VIBRAMYCIN) 100 MG capsule Take 1 capsule (100 mg total) by mouth 2 (two) times daily. 08/17/16  Yes Clayburn Pert, MD  DULoxetine (CYMBALTA) 20 MG capsule Take 20 mg by mouth daily.   Yes [provider]   etodolac (LODINE) 400 MG tablet Take 400 mg by mouth 2 (two) times daily.   Yes [provider]  ferrous gluconate (FERGON) 324 MG tablet Take 1 tablet (324 mg total) by mouth 2 (two) times daily with a meal. 05/23/16  Yes Theodoro Grist, MD  fluocinonide (LIDEX) 0.05 % external solution Apply 1 application topically 2 (two) times daily.   Yes [provider]  gabapentin (NEURONTIN) 100 MG capsule Take 200 mg by mouth at bedtime.    Yes [provider]  glucose blood test strip TEST three times a day 12/30/15  Yes [provider]  HUMULIN R U-500 KWIKPEN 500 UNIT/ML injection Inject 120 Units into the skin 2 (two) times daily with a meal.    Yes [provider]  ketoconazole (NIZORAL) 2 % shampoo Apply 1 application topically 2 (two) times a week.   Yes [provider]  Lancets Misc. (ACCU-CHEK FASTCLIX LANCET) KIT  12/27/15  Yes [provider]  levothyroxine (SYNTHROID, LEVOTHROID) 200 MCG tablet Take 200 mcg by mouth daily before breakfast.    Yes [provider]  loratadine (CLARITIN) 10 MG tablet Take 10 mg by mouth daily.   Yes [provider]  meclizine (ANTIVERT) 25 MG tablet Take 25 mg by mouth 3 (three) times daily as needed for dizziness.    Yes [provider]         mometasone (NASONEX) 50 MCG/ACT nasal spray Place 2 sprays into both nostrils daily.    Yes [provider]  montelukast (SINGULAIR) 10 MG tablet Take 10 mg by mouth at bedtime.    Yes [provider]  naphazoline-glycerin (CLEAR EYES) 0.012-0.2 % SOLN Place 1-2 drops into both eyes 4 (four) times daily as needed for irritation. 05/23/16  Yes Theodoro Grist, MD  olopatadine (PATANOL) 0.1 % ophthalmic solution Place 1 drop into both eyes 2 (two) times daily.   Yes [provider]  omeprazole (PRILOSEC) 40 MG capsule Take 40 mg by mouth daily.   Yes [provider]  ondansetron (ZOFRAN) 8 MG tablet Take  4-8 mg by mouth every 8 (eight) hours as needed for nausea or vomiting.   Yes [provider]  potassium chloride (K-DUR,KLOR-CON) 10 MEQ tablet Take 10 mEq by mouth daily.   Yes [provider]  Roflumilast 250 MCG TABS Take 1 tablet by mouth daily. 08/17/16  Yes [provider]  torsemide (DEMADEX) 20 MG tablet Take 40 mg by mouth 1 (one) times daily. 06/19/16  Yes [provider]  TRADJENTA 5 MG TABS tablet Take 5 mg by mouth daily.    Yes [provider]  traZODone (DESYREL) 50 MG tablet Take 50-100 mg by mouth at bedtime as needed for sleep.    Yes [provider]   Review of  Systems  Constitutional: Positive for fatigue. Negative for appetite change.  HENT: Positive for sinus pain (using flonase). Negative for congestion, postnasal drip and sore throat.   Eyes: Negative.   Respiratory: Positive for shortness of breath. Negative for cough, chest tightness and wheezing.   Cardiovascular: Positive for leg swelling. Negative for chest pain and palpitations.  Gastrointestinal: Positive for abdominal distention. Negative for abdominal pain.  Endocrine: Negative.   Genitourinary: Negative.   Musculoskeletal: Negative for back pain and neck pain.  Skin: Negative.   Allergic/Immunologic: Negative.   Neurological: Positive for light-headedness. Negative for dizziness and headaches.  Hematological: Negative for adenopathy. Does not bruise/bleed easily.  Psychiatric/Behavioral: Negative for dysphoric mood, sleep disturbance and suicidal ideas. The patient is not nervous/anxious.    Vitals:   08/18/16 1308  BP: 110/80  Pulse: 88  Resp: 20  SpO2: 97%  Weight: (!) 300 lb 6 oz (136.2 kg)  Height: 5' 3" (1.6 m)   Wt Readings from Last 3 Encounters:  08/18/16 (!) 300 lb 6 oz (136.2 kg)  06/22/16 288 lb 8 oz (130.9 kg)  06/15/16 277 lb 8 oz (125.9 kg)   Lab Results  Component Value Date   CREATININE 0.91 06/22/2016   CREATININE 0.93  06/15/2016   CREATININE 1.00 06/02/2016    Physical Exam  Constitutional: She is oriented to person, place, and time. She appears well-developed and well-nourished.  HENT:  Head: Normocephalic and atraumatic.  Neck: Normal range of motion. Neck supple. JVD present.  Cardiovascular: Normal rate and regular rhythm.   Pulmonary/Chest: Effort normal. She has no wheezes. She has no rales.  Abdominal: She exhibits distension. There is no tenderness.  Musculoskeletal: She exhibits edema (3+ pitting edema in bilateral lower legs). She exhibits no tenderness.  Neurological: She is alert and oriented to person, place, and time.  Skin: Skin is warm and dry.  Psychiatric: She has a normal mood and affect. Her behavior is normal. Thought content normal.  Nursing note and vitals reviewed.   Assessment & Plan:  1: Chronic heart failure with preserved ejection fraction- - NYHA class III - moderately fluid overloaded today with 3+ pitting edema - she has only been taking her torsemide as 54m once daily as she says that she didn't realize it was ordered as twice daily - will add metolazone 2.528mdaily for the next 3 days with the addition of 2093mpotassium for those 3 days as well.  - resume weighing daily and call for an overnight weight gain of >2 pounds or a weekly weight gain of >5 pounds; weight up 11.8 pounds since she was last here - drinking 48 ounces of fluid daily - not using salt but is using Mrs. Dash seasoning instead.  - ate rice that a caterer friend brought over and she said that it had numerous seasonings on it and tasted salty "but it tasted so good" - will check a BMP at her next visit - [place] [facility] ([place] and has PRN follow-up - wears oxygen at 2L around the clock - discussed getting home health in to help set up a system to manage her medications better but patient refuses saying "I know how to do my medicine"  2: HTN- - BP looks good today  3: Diabetes- -  hasn't checked her glucose "lately" - nonfasting glucose in the office was 153 - says that she drank 2 cups of coffee with creamer with artifical sweetener in it  Patient did not bring her medications nor  a list. Each medication was verbally reviewed with the patient and she was encouraged to bring the bottles to every visit to confirm accuracy of list.   Return next week for a recheck of symptoms and lab work.

## 2016-08-20 DIAGNOSIS — I5033 Acute on chronic diastolic (congestive) heart failure: Secondary | ICD-10-CM | POA: Insufficient documentation

## 2016-08-20 NOTE — Progress Notes (Signed)
   SUBJECTIVE Patient with a history of diabetes mellitus presents to office today complaining of elongated, thickened nails. Pain while ambulating in shoes. Patient is unable to trim their own nails. She is requesting diabetic shoes.  OBJECTIVE General Patient is awake, alert, and oriented x 3 and in no acute distress. Derm Skin is dry and supple bilateral. Negative open lesions or macerations. Remaining integument unremarkable. Nails are tender, long, thickened and dystrophic with subungual debris, consistent with onychomycosis, 1-5 bilateral. No signs of infection noted. Vasc  DP and PT pedal pulses palpable bilaterally. Temperature gradient within normal limits.  Neuro Epicritic and protective threshold sensation diminished bilaterally.  Musculoskeletal Exam No symptomatic pedal deformities noted bilateral. Muscular strength within normal limits.  ASSESSMENT 1. Diabetes Mellitus w/ peripheral neuropathy 2. Onychomycosis of nail due to dermatophyte bilateral 3. Pain in foot bilateral 4. Bilateral lower extremity edema/cellulitis being managed by Dr. Posey Pronto  PLAN OF CARE 1. Patient evaluated today. 2. Instructed to maintain good pedal hygiene and foot care. Stressed importance of controlling blood sugar.  3. Mechanical debridement of nails 1-5 bilaterally performed using a nail nipper. Filed with dremel without incident.  4. Authorization for diabetic shoes. 5. Return to clinic in 3 mos.     Edrick Kins, DPM [facility]  Dr. Edrick Kins, [address]                                        [place], [place] [id number]                Office [phone]  Fax [phone]

## 2016-08-21 ENCOUNTER — Ambulatory Visit (INDEPENDENT_AMBULATORY_CARE_PROVIDER_SITE_OTHER): Payer: Medicare Other | Admitting: Surgery

## 2016-08-21 ENCOUNTER — Encounter: Payer: Self-pay | Admitting: Surgery

## 2016-08-21 VITALS — BP 111/66 | HR 88 | Temp 97.8°F | Ht 63.0 in | Wt 283.0 lb

## 2016-08-21 DIAGNOSIS — L732 Hidradenitis suppurativa: Secondary | ICD-10-CM

## 2016-08-21 LAB — GLUCOSE, CAPILLARY: GLUCOSE-CAPILLARY: 153 mg/dL — AB (ref 65–99)

## 2016-08-21 MED ORDER — DOXYCYCLINE HYCLATE 100 MG PO CAPS: 100.0000 mg | ORAL_CAPSULE | Freq: Two times a day (BID) | ORAL | 0 refills | Status: DC

## 2016-08-21 NOTE — Progress Notes (Signed)
Outpatient Surgical Follow Up  08/21/2016  [name] [name] is an 61 y.o. female.   CC:HS  HPI: Patient started draining from her left breast area which been previously excised last week. She was started on doxycycline on Friday. She states that she feels much better and has much less pain and drainage. She shows me other areas in both axilla.  Past Medical History:  Diagnosis Date  . Allergic rhinitis   . Allergy   . Back pain   . COPD (chronic obstructive pulmonary disease) ([place])   . Degenerative joint disease of knee, left   . Depression   . Diabetes mellitus without complication ([place])   . Edema   . Elevated lipids   . GERD (gastroesophageal reflux disease)   . Hidradenitis   . History of colonic polyps   . Hypertension   . Hypothyroidism   . IBS (irritable bowel syndrome)   . Insomnia   . Obesity   . Oxygen dependent   . Pneumonia   . PVD (peripheral vascular disease) ([place])   . Sinusitis, chronic   . Sleep apnea   . Thyroid disease   . Vaginitis, atrophic   . Vertigo   . Vitamin D deficiency     Past Surgical History:  Procedure Laterality Date  . ABDOMINAL HYSTERECTOMY    . AXILLARY HIDRADENITIS EXCISION    . CESAREAN SECTION     x 2  . CHOLECYSTECTOMY    . HEEL SPUR EXCISION N/A   . HYDRADENITIS EXCISION Right 12/31/2015   Procedure: EXCISION HIDRADENITIS AXILLA;  Surgeon: Clayburn Pert, MD;  Location: [facility] ORS;  Service: General;  Laterality: Right;  . TONSILLECTOMY      Family History  Problem Relation Age of Onset  . Rashes / Skin problems Father   . Hypertension Father   . Heart disease Father   . Breast cancer Paternal Grandmother   . COPD Mother     Social History:  reports that she quit smoking about 21 years ago. She has never used smokeless tobacco. She reports that she does not drink alcohol or use drugs.  Allergies:  Allergies  Allergen Reactions  . Codeine Itching    Medications reviewed.   Review of Systems:   Review of  Systems  Constitutional: Negative for chills and fever.  Skin:       Drainage from multiple sites     Physical Exam:  BP 111/66   Pulse 88   Temp 97.8 F (36.6 C) (Oral)   Ht 5\' 3"  (1.6 m)   Wt 283 lb (128.4 kg)   BMI 50.13 kg/m   Physical Exam  Constitutional: She is well-developed, well-nourished, and in no distress.  Morbidly obese female patient in no acute distress  Skin: Skin is warm. There is erythema.  Left breast and inframammary fold there is a scar in right next to the scar is a draining area with minimal erythema  Both axilla demonstrate minimal drainage from sites.  Vitals reviewed.     No results found for this or any previous visit (from the past 48 hour(s)). No results found.  Assessment/Plan:  Recurrent hidradenitis suppurativa of the left breast and bilateral axillae. She is on doxycycline and doing much better. Recommend continue doxycycline and follow-up with Dr. Adonis Huguenin in the next week or 2.  Florene Glen, MD, FACS

## 2016-08-21 NOTE — Patient Instructions (Addendum)
Please see your follow up appointment listed below. Please pick up your prescription at the pharmacy.

## 2016-08-22 ENCOUNTER — Ambulatory Visit: Payer: Medicare Other | Admitting: Dietician

## 2016-08-24 ENCOUNTER — Telehealth: Payer: Self-pay

## 2016-08-24 NOTE — Telephone Encounter (Signed)
[name] [name] contacted the office states that she has been having cramps all day. She states that the cramping is "really bad" that it was "everywhere, even my fingers"  She states that she is currently taking her potassium but she is unsure what the dosage is.   Her Potassium when last checked was borderline low at 3.7. She also states that she doesn't feel like she is urinating as much as normal.   She is following up with the clinic tomorrow, asked if she would like to come in earlier in the day and she declined stating she has to wait for Meals on Wheels. I advised that if the pain gets worse and she feels that she should not wait to be seen in clinic or she begins to experience any other symptoms she may need to be seen in the ED.   Pt verbalized understanding.

## 2016-08-25 ENCOUNTER — Telehealth: Payer: Self-pay | Admitting: Family

## 2016-08-25 ENCOUNTER — Ambulatory Visit: Payer: Medicare Other | Attending: Family | Admitting: Family

## 2016-08-25 ENCOUNTER — Encounter: Payer: Self-pay | Admitting: Family

## 2016-08-25 VITALS — BP 142/60 | HR 85 | Resp 20 | Ht 63.0 in | Wt 279.5 lb

## 2016-08-25 DIAGNOSIS — Z87891 Personal history of nicotine dependence: Secondary | ICD-10-CM | POA: Diagnosis not present

## 2016-08-25 DIAGNOSIS — I5032 Chronic diastolic (congestive) heart failure: Secondary | ICD-10-CM

## 2016-08-25 DIAGNOSIS — G4733 Obstructive sleep apnea (adult) (pediatric): Secondary | ICD-10-CM | POA: Diagnosis not present

## 2016-08-25 DIAGNOSIS — Z79899 Other long term (current) drug therapy: Secondary | ICD-10-CM | POA: Diagnosis not present

## 2016-08-25 DIAGNOSIS — Z794 Long term (current) use of insulin: Secondary | ICD-10-CM | POA: Diagnosis not present

## 2016-08-25 DIAGNOSIS — J449 Chronic obstructive pulmonary disease, unspecified: Secondary | ICD-10-CM | POA: Insufficient documentation

## 2016-08-25 DIAGNOSIS — I110 Hypertensive heart disease with heart failure: Secondary | ICD-10-CM | POA: Insufficient documentation

## 2016-08-25 DIAGNOSIS — I10 Essential (primary) hypertension: Secondary | ICD-10-CM

## 2016-08-25 DIAGNOSIS — E11649 Type 2 diabetes mellitus with hypoglycemia without coma: Secondary | ICD-10-CM

## 2016-08-25 DIAGNOSIS — F329 Major depressive disorder, single episode, unspecified: Secondary | ICD-10-CM | POA: Insufficient documentation

## 2016-08-25 DIAGNOSIS — K219 Gastro-esophageal reflux disease without esophagitis: Secondary | ICD-10-CM | POA: Diagnosis not present

## 2016-08-25 DIAGNOSIS — Z9981 Dependence on supplemental oxygen: Secondary | ICD-10-CM | POA: Diagnosis not present

## 2016-08-25 DIAGNOSIS — E559 Vitamin D deficiency, unspecified: Secondary | ICD-10-CM | POA: Diagnosis not present

## 2016-08-25 DIAGNOSIS — E119 Type 2 diabetes mellitus without complications: Secondary | ICD-10-CM | POA: Diagnosis not present

## 2016-08-25 DIAGNOSIS — J410 Simple chronic bronchitis: Secondary | ICD-10-CM

## 2016-08-25 DIAGNOSIS — I509 Heart failure, unspecified: Secondary | ICD-10-CM | POA: Diagnosis not present

## 2016-08-25 LAB — BASIC METABOLIC PANEL
Anion gap: 14 (ref 5–15)
BUN: 25 mg/dL — AB (ref 6–20)
CO2: 37 mmol/L — ABNORMAL HIGH (ref 22–32)
CREATININE: 0.87 mg/dL (ref 0.44–1.00)
Calcium: 10.1 mg/dL (ref 8.9–10.3)
Chloride: 86 mmol/L — ABNORMAL LOW (ref 101–111)
GFR calc Af Amer: >60 mL/min (ref >60)
GLUCOSE: 135 mg/dL — AB (ref 65–99)
POTASSIUM: 3 mmol/L — AB (ref 3.5–5.1)
SODIUM: 137 mmol/L (ref 135–145)

## 2016-08-25 LAB — GLUCOSE, CAPILLARY: GLUCOSE-CAPILLARY: 160 mg/dL — AB (ref 65–99)

## 2016-08-25 NOTE — Telephone Encounter (Signed)
LM on patient's voicemail regarding lab work that was drawn today (08/25/16). Potassium level is low at 3.0 which could certainly be contributing to her cramps that she's having.   LM for patient to increase her potassium to 51meq BID (patient currently taking 4meq BID) and that we would need to recheck lab work next week.   Also LM on daughter Joelene Millin Loy's) answering machine to have patient check her voicemail. Will follow back up on 08/28/16.

## 2016-08-25 NOTE — Progress Notes (Signed)
Patient ID: [name] [name], female    DOB: 11-11-1955, 61 y.o.   MRN: [id number]  HPI  [name] [name] is a 61 y/o female with a history of vitamin D deficiency, obstructive sleep apnea, GERD, pneumonia, PVD, DM, depression, DJD, COPD with oxygen, allergies, past tobacco use and chronic heart failure.   Last echo was done on 05/21/16 and showed an EF of 50% along with trivial MR/TR/PR. EF has declined slightly from June 2017.  Admitted 05/20/16 due to COPD/HF exacerbation. Patient dropped about 3.1L during admission. Given IV diuretics, nebulizers, steroids and antibiotics. Not wearing CPAP and follow-up with pulmonologist was encouraged. Discharged home after 4 days with home health. Admitted 09/08/15 with acute on chronic renal failure due to dehydration and over diuresis. Given gentle IV hydration. Discharged home after 2 days with home health services.  She presents today for her follow-up visit with a chief complaint of leg cramping and moderate shortness of breath with little exertion. She describes this as chronic in nature with waxing and waning of severity. She wears 2 L of oxygen. Recently prescribed Daliresp and feels as if her shortness of breath has improved. Her shortness of breath is accompanied by fatigue, leg swelling, light-headedness, dizziness, and headaches.   Past Medical History:  Diagnosis Date  . Allergic rhinitis   . Allergy   . Back pain   . COPD (chronic obstructive pulmonary disease) ([place])   . Degenerative joint disease of knee, left   . Depression   . Diabetes mellitus without complication ([place])   . Edema   . Elevated lipids   . GERD (gastroesophageal reflux disease)   . Hidradenitis   . History of colonic polyps   . Hypertension   . Hypothyroidism   . IBS (irritable bowel syndrome)   . Insomnia   . Obesity   . Oxygen dependent   . Pneumonia   . PVD (peripheral vascular disease) ([place])   . Sinusitis, chronic   . Sleep apnea   . Thyroid disease   . Vaginitis,  atrophic   . Vertigo   . Vitamin D deficiency    Past Surgical History:  Procedure Laterality Date  . ABDOMINAL HYSTERECTOMY    . AXILLARY HIDRADENITIS EXCISION    . CESAREAN SECTION     x 2  . CHOLECYSTECTOMY    . HEEL SPUR EXCISION N/A   . HYDRADENITIS EXCISION Right 12/31/2015   Procedure: EXCISION HIDRADENITIS AXILLA;  Surgeon: Clayburn Pert, MD;  Location: [facility] ORS;  Service: General;  Laterality: Right;  . TONSILLECTOMY     Family History  Problem Relation Age of Onset  . Rashes / Skin problems Father   . Hypertension Father   . Heart disease Father   . Breast cancer Paternal Grandmother   . COPD Mother    Social History  Substance Use Topics  . Smoking status: Former Smoker    Quit date: 12/23/1994  . Smokeless tobacco: Never Used  . Alcohol use No   Allergies  Allergen Reactions  . Codeine Itching   Prior to Admission medications   Medication Sig Start Date End Date Taking? Authorizing Provider  albuterol (PROVENTIL HFA;VENTOLIN HFA) 108 (90 Base) MCG/ACT inhaler Inhale 2 puffs into the lungs every 6 (six) hours as needed for wheezing or shortness of breath.   Yes [provider]  albuterol (PROVENTIL) (2.5 MG/3ML) 0.083% nebulizer solution Take 2.5 mg by nebulization every 6 (six) hours as needed for wheezing or shortness of breath.  Yes [provider]  atorvastatin (LIPITOR) 20 MG tablet Take 20 mg by mouth at bedtime.    Yes [provider]  benzonatate (TESSALON) 100 MG capsule Take 100 mg by mouth 3 (three) times daily as needed for cough.   Yes [provider]  budesonide-formoterol (SYMBICORT) 160-4.5 MCG/ACT inhaler Inhale 2 puffs into the lungs 2 (two) times daily.   Yes [provider]  Cinnamon 500 MG capsule Take 500 mg by mouth daily.   Yes [provider]  clindamycin (CLINDAGEL) 1 % gel Apply 1 application topically 2 (two) times daily.   Yes [provider]  DALIRESP 250 MCG TABS  Take 1 tablet by mouth daily.  08/10/16  Yes [provider]  doxepin (SINEQUAN) 50 MG capsule Take 50 mg by mouth at bedtime.   Yes [provider]  doxycycline (VIBRAMYCIN) 100 MG capsule Take 1 capsule (100 mg total) by mouth 2 (two) times daily. 08/21/16  Yes Florene Glen, MD  DULoxetine (CYMBALTA) 20 MG capsule Take 20 mg by mouth daily.   Yes [provider]  fluconazole (DIFLUCAN) 150 MG tablet Take 150 mg by mouth daily.   Yes [provider]  fluocinonide (LIDEX) 0.05 % external solution Apply 1 application topically 2 (two) times daily.   Yes [provider]  gabapentin (NEURONTIN) 100 MG capsule Take 200 mg by mouth at bedtime.    Yes [provider]  glucose blood test strip TEST three times a day 12/30/15  Yes [provider]  HUMULIN R U-500 KWIKPEN 500 UNIT/ML injection Inject 120 Units into the skin 2 (two) times daily with a meal.    Yes [provider]  ketoconazole (NIZORAL) 2 % shampoo Apply 1 application topically 2 (two) times a week.   Yes [provider]  Lancets Misc. (ACCU-CHEK FASTCLIX LANCET) KIT  12/27/15  Yes [provider]  levothyroxine (SYNTHROID, LEVOTHROID) 200 MCG tablet Take 200 mcg by mouth daily before breakfast.    Yes [provider]  meclizine (ANTIVERT) 25 MG tablet Take 25 mg by mouth 3 (three) times daily as needed for dizziness.    Yes [provider]  mometasone (NASONEX) 50 MCG/ACT nasal spray Place 2 sprays into both nostrils daily.    Yes [provider]  montelukast (SINGULAIR) 10 MG tablet Take 10 mg by mouth at bedtime.    Yes [provider]  Multiple Vitamins-Minerals (CENTRAVITES 50 PLUS PO) Take 1 tablet by mouth daily.   Yes [provider]  naphazoline-glycerin (CLEAR EYES) 0.012-0.2 % SOLN Place 1-2 drops into both eyes 4 (four) times daily as needed for irritation. 05/23/16  Yes Theodoro Grist, MD   olopatadine (PATANOL) 0.1 % ophthalmic solution Place 1 drop into both eyes 2 (two) times daily.   Yes [provider]  omeprazole (PRILOSEC) 40 MG capsule Take 40 mg by mouth daily.   Yes [provider]  potassium chloride (K-DUR) 10 MEQ tablet Take 10 mEq by mouth 2 (two) times daily.  07/16/16  Yes [provider]  torsemide (DEMADEX) 20 MG tablet Take 40 mg by mouth 2 (two) times daily. 06/19/16  Yes [provider]  TRADJENTA 5 MG TABS tablet Take 5 mg by mouth daily.    Yes [provider]  traZODone (DESYREL) 50 MG tablet Take 50-100 mg by mouth at bedtime as needed for sleep.    Yes [provider]  Turmeric 500 MG CAPS Take 1 capsule by  mouth daily.   Yes [provider]    Review of Systems  Constitutional: Positive for fatigue. Negative for appetite change.  HENT: Negative for congestion, postnasal drip, sinus pain (using flonase) and sore throat.   Eyes: Negative.   Respiratory: Positive for shortness of breath. Negative for cough, chest tightness and wheezing.   Cardiovascular: Positive for leg swelling. Negative for chest pain and palpitations.  Gastrointestinal: Positive for abdominal distention and abdominal pain.  Endocrine: Negative.   Genitourinary: Negative.   Musculoskeletal: Positive for arthralgias and back pain. Negative for neck pain.  Skin: Negative.   Allergic/Immunologic: Negative.   Neurological: Positive for dizziness, light-headedness and headaches.       Cramping across abdomen, hands & legs    Hematological: Negative for adenopathy. Does not bruise/bleed easily.  Psychiatric/Behavioral: Negative for dysphoric mood, sleep disturbance and suicidal ideas. The patient is not nervous/anxious.    Vitals:   08/25/16 1252  BP: (!) 142/60  Pulse: 85  Resp: 20  SpO2: 93%  Weight: 279 lb 8 oz (126.8 kg)  Height: 5' 3"  (1.6 m)   Wt Readings from Last 3 Encounters:  08/25/16 279 lb 8 oz (126.8 kg)   08/21/16 283 lb (128.4 kg)  08/18/16 (!) 300 lb 6 oz (136.2 kg)    Lab Results  Component Value Date   CREATININE 0.91 06/22/2016   CREATININE 0.93 06/15/2016   CREATININE 1.00 06/02/2016    Physical Exam  Constitutional: She is oriented to person, place, and time. She appears well-developed and well-nourished.  HENT:  Head: Normocephalic and atraumatic.  Neck: Normal range of motion. Neck supple. No JVD present.  Cardiovascular: Normal rate and regular rhythm.   Pulmonary/Chest: Effort normal. She has no wheezes. She has no rales.  Abdominal: She exhibits distension. There is no tenderness.  Musculoskeletal: She exhibits edema (3+ pitting edema in bilateral lower legs). She exhibits no tenderness.  Neurological: She is alert and oriented to person, place, and time.  Skin: Skin is warm and dry.  Psychiatric: She has a normal mood and affect. Her behavior is normal. Thought content normal.  Nursing note and vitals reviewed.   Assessment & Plan:  1: Chronic heart failure with preserved ejection fraction- - NYHA class III - moderately fluid overloaded today with 3+ pitting edema - weight down 4 pounds since last visit and she is weighing daily. Reminded her to call for an overnight weight gain of 2 lbs or a weekly weight gain of 5 lbs - not adding salt and using a salt substitute. Using meals on wheels for food; reminded her to follow a 2000 mg sodium diet - cramping in legs and hands which may be from the metolazone and toresemide - drinking about 48 ounces of fluid daily - will check BMP today - PRN follow-up with cardiologist [name]) - spoke with patient last visit about getting home health to help set up a system to manage her medication better but patient refused last visit saying "I know how to do my medicine"  2: HTN- - BP slightly high; 142/60 mmHg   3: Diabetes- - checks daily but does not remember glucose numbers - fasting glucose in the office was 160 - counseled  on the importance of eating with U500 insulin as the patient was not feeling well during visit   4: COPD- - continue inhalers and Daliresp - wears 2L of oxygen, but was not wearing during visit  Reviewed patients medication bottles.    Return in 1  month or sooner if questions or concerns.   Loree Fee, PharmD 2:41 PM 08/25/2016

## 2016-08-25 NOTE — Patient Instructions (Signed)
Continue weighing daily and call for an overnight weight gain of > 2 pounds or a weekly weight gain of >5 pounds. 

## 2016-08-28 ENCOUNTER — Telehealth: Payer: Self-pay | Admitting: Family

## 2016-08-28 NOTE — Telephone Encounter (Signed)
Spoke with patient regarding telephone message that I had left on 08/25/16. She confirms that she got the message and has been taking 71meq (two 10 meq tablets) twice daily since 08/25/16. Reports feeling like the cramps have improved.   Appointment made for a nurse visit on 08/31/16 to recheck her lab work.

## 2016-08-31 ENCOUNTER — Ambulatory Visit: Payer: Medicare Other | Admitting: Family

## 2016-08-31 ENCOUNTER — Telehealth: Payer: Self-pay

## 2016-08-31 NOTE — Telephone Encounter (Signed)
Carlean Purl with [facility] contacted the office. She had reached out to the patient about having a nurse come out to draw her lab work and set up home health. She states that the patient was offered an appointment time of 9 am tomorrow so that they would have plenty of time to get the lab work back so that changes could be made to medication if needed.   Ms. Weyland declined the appointment stating that she only wants them to come out between 2 and 3 pm tomorrow and only wants the blood work drawn and nothing else and does not want to schedule any additional appointments.  Adonis Brook explained the importance of home health and why the office was asking for it, and the patient still refused.   Ms. Alamillo states that she will call the office and come in on Friday for her lab work. As of this time we have not received a follow up phone call to schedule this.

## 2016-08-31 NOTE — Telephone Encounter (Addendum)
Ms. [name] [name] the office this morning stating she would not be able to make it in because she has not slept in 3 days. States that her weight has gone up from 277 lb on 08/25/2016 to 289 lb today. She has not been weighting daily so she did not know she had gained the weight until today.   I advised Ms. [name] [name] that no changes can be made to her medication until she has her lab work drawn which was to be done today due to her potassium levels being so low on her last check. I asked if she would be willing to consider home health again as we had discussed in the past. Explained that they would be able to draw her blood at home, as well as help her to manage her medications. She was agreeable to set home health up I place order.

## 2016-09-01 NOTE — Telephone Encounter (Signed)
Spoke with Carlean Purl with [facility] in regards to having them go out next week to see Ms. Warrior.   She will put her on the schedule for an afternoon next week and will have her labs drawn. She will contact the office if she has any problems.

## 2016-09-01 NOTE — Telephone Encounter (Signed)
Spoke with Ms. Grussing this afternoon about her blood work. She states that she would still like for it to be done in the home as previously discussed.   I spoke with the patient about having home health in the home, and the fact that it was not just for lab work, that it was to help manage medications as well as educate patients. I asked if she was agreeable to have home health knowing that all of this was included, expressing that it was not in the interest of anyone to have them come out and just draw labs. She states that she is agreeable to have them come out and provide both medication management and patient education.  I will contact Carlean Purl with [facility] to get her on the schedule for next week.

## 2016-09-01 NOTE — Telephone Encounter (Signed)
Attempted to contact patient in regards to obtaining her labs. She spoke with the representative from [facility] yesterday as noted and stated that she would call the office and has not called.   I was unable to reach the patient at this time and asked that she call the office when she receives my message.

## 2016-09-06 ENCOUNTER — Encounter: Payer: Self-pay | Admitting: General Surgery

## 2016-09-06 ENCOUNTER — Ambulatory Visit (INDEPENDENT_AMBULATORY_CARE_PROVIDER_SITE_OTHER): Payer: Medicare Other | Admitting: General Surgery

## 2016-09-06 VITALS — BP 132/76 | HR 109 | Temp 98.8°F | Ht 63.0 in | Wt 289.4 lb

## 2016-09-06 DIAGNOSIS — L732 Hidradenitis suppurativa: Secondary | ICD-10-CM

## 2016-09-06 MED ORDER — MINOCYCLINE HCL 100 MG PO CAPS: 100.0000 mg | ORAL_CAPSULE | Freq: Every day | ORAL | 1 refills | Status: AC

## 2016-09-06 MED ORDER — CHLORHEXIDINE GLUCONATE 4 % EX SOLN
1.0000 | Freq: Two times a day (BID) | CUTANEOUS | 1 refills | Status: DC
Start: 2016-09-06 — End: 2017-10-01

## 2016-09-06 NOTE — Patient Instructions (Signed)
Wash with Chlorahexadine Soap provided twice daily until areas are healed. Then daily to these same areas afterwards.  We will send a referral to [facility] for further treatment options regarding your Hydradenitis and to get your Diabetes under better control. You should be hearing from their office in 5-7 days.   We will see you back in 5 weeks as scheduled below. If you have not seen endocrinology by that time, we will call you and reschedule this.

## 2016-09-06 NOTE — Progress Notes (Signed)
Outpatient Surgical Follow Up  09/06/2016  [name] [name] is an 61 y.o. female.   Chief Complaint  Patient presents with  . Follow-up    Hidradentitis    HPI: 61 year old female returns to clinic for follow-up of hidradenitis. She's been seen numerous times for this in the past. She reports multiple areas of flareup recently that have improved after the most recent course of doxycycline. They have not completely resolved yet and they continued to cause intermittent drainage and pains. She denies any fevers, chills, nausea, vomiting, chest pain, shortness of breath. She states she stop the doxycycline recently as it was causing diarrhea. Her primary areas of complaints are to her left lateral breast, her left axilla, bilateral groins. Her left axilla was area that bothers her the most currently.  Past Medical History:  Diagnosis Date  . Allergic rhinitis   . Allergy   . Back pain   . COPD (chronic obstructive pulmonary disease) ([place])   . Degenerative joint disease of knee, left   . Depression   . Diabetes mellitus without complication ([place])   . Edema   . Elevated lipids   . GERD (gastroesophageal reflux disease)   . Hidradenitis   . History of colonic polyps   . Hypertension   . Hypothyroidism   . IBS (irritable bowel syndrome)   . Insomnia   . Obesity   . Oxygen dependent   . Pneumonia   . PVD (peripheral vascular disease) ([place])   . Sinusitis, chronic   . Sleep apnea   . Thyroid disease   . Vaginitis, atrophic   . Vertigo   . Vitamin D deficiency     Past Surgical History:  Procedure Laterality Date  . ABDOMINAL HYSTERECTOMY    . AXILLARY HIDRADENITIS EXCISION    . CESAREAN SECTION     x 2  . CHOLECYSTECTOMY    . HEEL SPUR EXCISION N/A   . HYDRADENITIS EXCISION Right 12/31/2015   Procedure: EXCISION HIDRADENITIS AXILLA;  Surgeon: Clayburn Pert, MD;  Location: [facility] ORS;  Service: General;  Laterality: Right;  . TONSILLECTOMY      Family History  Problem  Relation Age of Onset  . Rashes / Skin problems Father   . Hypertension Father   . Heart disease Father   . Breast cancer Paternal Grandmother   . COPD Mother     Social History:  reports that she quit smoking about 21 years ago. She has never used smokeless tobacco. She reports that she does not drink alcohol or use drugs.  Allergies:  Allergies  Allergen Reactions  . Codeine Itching    Medications reviewed.    ROS A multipoint review of systems was completed, all pertinent positives and negatives are documented within the history of present illness and remainder are negative.   BP 132/76   Pulse (!) 109   Temp 98.8 F (37.1 C) (Oral)   Ht 5\' 3"  (1.6 m)   Wt 131.3 kg (289 lb 6.4 oz)   BMI 51.26 kg/m   Physical Exam Gen.: No acute distress Neck: Supple and nontender Chest: Clear to auscultation Heart: Tachycardic Abdomen: Very large, soft, nontender.   Skin: Multiple areas of disruption. Left groin with what appears to be a resolving bruise with some fluid but no evidence of pit or hidradenitis scarring. Left breast with tender scarring from her previous excision site. No active drainage. Left axilla with a in expressible pustule that is drained in clinic with pressure. Left posterior neck with  scarring from hidradenitis but no active infection. Right axilla with scarring from previous excision but no active infection. Right groin with multiple pits and skin disruption from active hidradenitis.    No results found for this or any previous visit (from the past 48 hour(s)). No results found.  Assessment/Plan:  1. Hidradenitis 61 year old female with multiple sites of diverticulitis. Very difficult control in this patient given her obesity, steroid use for lung issues, noncompliant with diabetic medications. Had long conversation with patient about the difficulties in dealing with multiple sites hidradenitis. Given that she had some improvement with the doxycycline but  had to stop it due to GI complaints we will change her antibiotics to minocycline and attempt a two-month course. We will provide her with chlorhexidine wash to clean the areas twice daily to help prevent further infection. Discussed referral to endocrinology for better control diabetes and to assess whether or not antiandrogen medications can be of assistance in this patient. She understands that her obesity and diabetes playing a major role in her hidradenitis and voices understanding that those need to be better controlled. She'll follow-up in clinic in 5 weeks to assess for improvement or worsening of the areas.  A total 25 minutes was used on this encounter with greater than 50% of a use for counseling and coordination of care.   Clayburn Pert, MD FACS General Surgeon  09/06/2016,10:52 AM

## 2016-09-07 ENCOUNTER — Telehealth: Payer: Self-pay | Admitting: General Surgery

## 2016-09-07 NOTE — Telephone Encounter (Signed)
Patient was referred to [facility] Endocrinology to Dr Gabriel Carina for diabetes management and hydradenitis anti-antigen therapy.   I have faxed all clinic info to [phone].  Nate--referral Cord will contact the patient by the end of next week with an appointment. Dr Gabriel Carina is the only provider at this time seeing new patients.

## 2016-09-12 ENCOUNTER — Telehealth: Payer: Self-pay | Admitting: Surgery

## 2016-09-12 NOTE — Telephone Encounter (Signed)
I have called Dr Joycie Peek office to check the referral status. Nate from office has called patient to make an appointment. He has left the patient a message on Voicemail.

## 2016-09-13 NOTE — Telephone Encounter (Signed)
Patient is returning your phone call. I read the information documented and advised her to check her voicemail and call them back. Patient understood.

## 2016-09-19 ENCOUNTER — Ambulatory Visit: Payer: Medicare Other | Attending: Family | Admitting: Family

## 2016-09-19 ENCOUNTER — Encounter: Payer: Self-pay | Admitting: Family

## 2016-09-19 VITALS — BP 130/78 | HR 93 | Resp 20 | Ht 63.0 in | Wt 285.5 lb

## 2016-09-19 DIAGNOSIS — Z9981 Dependence on supplemental oxygen: Secondary | ICD-10-CM | POA: Diagnosis not present

## 2016-09-19 DIAGNOSIS — Z87891 Personal history of nicotine dependence: Secondary | ICD-10-CM | POA: Diagnosis not present

## 2016-09-19 DIAGNOSIS — E559 Vitamin D deficiency, unspecified: Secondary | ICD-10-CM | POA: Insufficient documentation

## 2016-09-19 DIAGNOSIS — Z90710 Acquired absence of both cervix and uterus: Secondary | ICD-10-CM | POA: Insufficient documentation

## 2016-09-19 DIAGNOSIS — Z9889 Other specified postprocedural states: Secondary | ICD-10-CM | POA: Insufficient documentation

## 2016-09-19 DIAGNOSIS — E11649 Type 2 diabetes mellitus with hypoglycemia without coma: Secondary | ICD-10-CM

## 2016-09-19 DIAGNOSIS — N179 Acute kidney failure, unspecified: Secondary | ICD-10-CM | POA: Insufficient documentation

## 2016-09-19 DIAGNOSIS — Z885 Allergy status to narcotic agent status: Secondary | ICD-10-CM | POA: Diagnosis not present

## 2016-09-19 DIAGNOSIS — Z803 Family history of malignant neoplasm of breast: Secondary | ICD-10-CM | POA: Diagnosis not present

## 2016-09-19 DIAGNOSIS — L732 Hidradenitis suppurativa: Secondary | ICD-10-CM | POA: Insufficient documentation

## 2016-09-19 DIAGNOSIS — N189 Chronic kidney disease, unspecified: Secondary | ICD-10-CM | POA: Insufficient documentation

## 2016-09-19 DIAGNOSIS — K219 Gastro-esophageal reflux disease without esophagitis: Secondary | ICD-10-CM | POA: Insufficient documentation

## 2016-09-19 DIAGNOSIS — Z8249 Family history of ischemic heart disease and other diseases of the circulatory system: Secondary | ICD-10-CM | POA: Insufficient documentation

## 2016-09-19 DIAGNOSIS — K589 Irritable bowel syndrome without diarrhea: Secondary | ICD-10-CM | POA: Insufficient documentation

## 2016-09-19 DIAGNOSIS — Z836 Family history of other diseases of the respiratory system: Secondary | ICD-10-CM | POA: Diagnosis not present

## 2016-09-19 DIAGNOSIS — I10 Essential (primary) hypertension: Secondary | ICD-10-CM

## 2016-09-19 DIAGNOSIS — Z794 Long term (current) use of insulin: Secondary | ICD-10-CM | POA: Insufficient documentation

## 2016-09-19 DIAGNOSIS — I130 Hypertensive heart and chronic kidney disease with heart failure and stage 1 through stage 4 chronic kidney disease, or unspecified chronic kidney disease: Secondary | ICD-10-CM | POA: Insufficient documentation

## 2016-09-19 DIAGNOSIS — I5032 Chronic diastolic (congestive) heart failure: Secondary | ICD-10-CM | POA: Insufficient documentation

## 2016-09-19 DIAGNOSIS — Z86010 Personal history of colonic polyps: Secondary | ICD-10-CM | POA: Insufficient documentation

## 2016-09-19 DIAGNOSIS — E669 Obesity, unspecified: Secondary | ICD-10-CM | POA: Insufficient documentation

## 2016-09-19 DIAGNOSIS — F329 Major depressive disorder, single episode, unspecified: Secondary | ICD-10-CM | POA: Insufficient documentation

## 2016-09-19 DIAGNOSIS — J449 Chronic obstructive pulmonary disease, unspecified: Secondary | ICD-10-CM | POA: Diagnosis not present

## 2016-09-19 DIAGNOSIS — Z9049 Acquired absence of other specified parts of digestive tract: Secondary | ICD-10-CM | POA: Insufficient documentation

## 2016-09-19 DIAGNOSIS — G4733 Obstructive sleep apnea (adult) (pediatric): Secondary | ICD-10-CM | POA: Diagnosis not present

## 2016-09-19 DIAGNOSIS — E039 Hypothyroidism, unspecified: Secondary | ICD-10-CM | POA: Diagnosis not present

## 2016-09-19 DIAGNOSIS — E1122 Type 2 diabetes mellitus with diabetic chronic kidney disease: Secondary | ICD-10-CM | POA: Diagnosis not present

## 2016-09-19 DIAGNOSIS — J410 Simple chronic bronchitis: Secondary | ICD-10-CM

## 2016-09-19 LAB — GLUCOSE, CAPILLARY
GLUCOSE-CAPILLARY: 65 mg/dL (ref 65–99)
GLUCOSE-CAPILLARY: 67 mg/dL (ref 65–99)

## 2016-09-19 NOTE — Patient Instructions (Signed)
Continue weighing daily and call for an overnight weight gain of > 2 pounds or a weekly weight gain of >5 pounds. 

## 2016-09-19 NOTE — Progress Notes (Signed)
Patient ID: [name] [name], female    DOB: 09/24/1955, 61 y.o.   MRN: [id number]  HPI  [name] [name] is a 61 y/o female with a history of vitamin D deficiency, obstructive sleep apnea, GERD, pneumonia, PVD, DM, depression, DJD, COPD with oxygen, allergies, past tobacco use and chronic heart failure.   Last echo was done on 05/21/16 and showed an EF of 50% along with trivial MR/TR/PR. EF has declined slightly from June 2017.  Admitted 05/20/16 due to COPD/HF exacerbation. Patient dropped about 3.1L during admission. Given IV diuretics, nebulizers, steroids and antibiotics. Not wearing CPAP and follow-up with pulmonologist was encouraged. Discharged home after 4 days with home health. Admitted 09/08/15 with acute on chronic renal failure due to dehydration and over diuresis. Given gentle IV hydration. Discharged home after 2 days with home health services.  She presents today for her follow-up visit with a chief complaint of moderate shortness of breath with minimal exertion. She says that this has been present for many years with varying levels of severity. She has associated fatigue, edema and weight gain along with this. Admits to not being very active.   Past Medical History:  Diagnosis Date  . Allergic rhinitis   . Allergy   . Back pain   . COPD (chronic obstructive pulmonary disease) ([place])   . Degenerative joint disease of knee, left   . Depression   . Diabetes mellitus without complication ([place])   . Edema   . Elevated lipids   . GERD (gastroesophageal reflux disease)   . Hidradenitis   . History of colonic polyps   . Hypertension   . Hypothyroidism   . IBS (irritable bowel syndrome)   . Insomnia   . Obesity   . Oxygen dependent   . Pneumonia   . PVD (peripheral vascular disease) ([place])   . Sinusitis, chronic   . Sleep apnea   . Thyroid disease   . Vaginitis, atrophic   . Vertigo   . Vitamin D deficiency    Past Surgical History:  Procedure Laterality Date  . ABDOMINAL  HYSTERECTOMY    . AXILLARY HIDRADENITIS EXCISION    . CESAREAN SECTION     x 2  . CHOLECYSTECTOMY    . HEEL SPUR EXCISION N/A   . HYDRADENITIS EXCISION Right 12/31/2015   Procedure: EXCISION HIDRADENITIS AXILLA;  Surgeon: Clayburn Pert, MD;  Location: [facility] ORS;  Service: General;  Laterality: Right;  . TONSILLECTOMY     Family History  Problem Relation Age of Onset  . Rashes / Skin problems Father   . Hypertension Father   . Heart disease Father   . Breast cancer Paternal Grandmother   . COPD Mother    Social History  Substance Use Topics  . Smoking status: Former Smoker    Quit date: 12/23/1994  . Smokeless tobacco: Never Used  . Alcohol use No   Allergies  Allergen Reactions  . Codeine Itching   Prior to Admission medications   Medication Sig Start Date End Date Taking? Authorizing Provider  albuterol (PROVENTIL HFA;VENTOLIN HFA) 108 (90 Base) MCG/ACT inhaler Inhale 2 puffs into the lungs every 6 (six) hours as needed for wheezing or shortness of breath.   Yes [provider]  albuterol (PROVENTIL) (2.5 MG/3ML) 0.083% nebulizer solution Take 2.5 mg by nebulization every 6 (six) hours as needed for wheezing or shortness of breath.    Yes [provider]  atorvastatin (LIPITOR) 20 MG tablet Take 20 mg by mouth at bedtime.  Yes [provider]  benzonatate (TESSALON) 100 MG capsule Take 100 mg by mouth 3 (three) times daily as needed for cough.   Yes [provider]  budesonide-formoterol (SYMBICORT) 160-4.5 MCG/ACT inhaler Inhale 2 puffs into the lungs 2 (two) times daily.   Yes [provider]  Chlorhexidine Gluconate 4 % SOLN Apply 1 application topically 2 (two) times daily. 09/06/16  Yes Clayburn Pert, MD  Cinnamon 500 MG capsule Take 500 mg by mouth daily.   Yes [provider]  clindamycin (CLINDAGEL) 1 % gel Apply 1 application topically 2 (two) times daily.   Yes [provider]  DALIRESP 250 MCG TABS  Take 1 tablet by mouth daily.  08/10/16  Yes [provider]  doxepin (SINEQUAN) 50 MG capsule Take 50 mg by mouth at bedtime.   Yes [provider]  DULoxetine (CYMBALTA) 20 MG capsule Take 20 mg by mouth daily.   Yes [provider]  fluconazole (DIFLUCAN) 150 MG tablet Take 150 mg by mouth daily.   Yes [provider]  fluocinonide (LIDEX) 0.05 % external solution Apply 1 application topically 2 (two) times daily.   Yes [provider]  gabapentin (NEURONTIN) 100 MG capsule Take 200 mg by mouth at bedtime.    Yes [provider]  glucose blood test strip TEST three times a day 12/30/15  Yes [provider]  HUMULIN R U-500 KWIKPEN 500 UNIT/ML injection Inject 120 Units into the skin 2 (two) times daily with a meal.    Yes [provider]  ketoconazole (NIZORAL) 2 % shampoo Apply 1 application topically 2 (two) times a week.   Yes [provider]  Lancets Misc. (ACCU-CHEK FASTCLIX LANCET) KIT  12/27/15  Yes [provider]  levothyroxine (SYNTHROID, LEVOTHROID) 200 MCG tablet Take 200 mcg by mouth daily before breakfast.    Yes [provider]  meclizine (ANTIVERT) 25 MG tablet Take 25 mg by mouth 3 (three) times daily as needed for dizziness.    Yes [provider]  minocycline (MINOCIN,DYNACIN) 100 MG capsule Take 1 capsule (100 mg total) by mouth daily. 09/06/16 11/05/16 Yes Clayburn Pert, MD  mometasone (NASONEX) 50 MCG/ACT nasal spray Place 2 sprays into both nostrils daily.    Yes [provider]  montelukast (SINGULAIR) 10 MG tablet Take 10 mg by mouth at bedtime.    Yes [provider]  Multiple Vitamins-Minerals (CENTRAVITES 50 PLUS PO) Take 1 tablet by mouth daily.   Yes [provider]  naphazoline-glycerin (CLEAR EYES) 0.012-0.2 % SOLN Place 1-2 drops into both eyes 4 (four) times daily as needed for irritation. 05/23/16  Yes Theodoro Grist, MD   olopatadine (PATANOL) 0.1 % ophthalmic solution Place 1 drop into both eyes 2 (two) times daily.   Yes [provider]  omeprazole (PRILOSEC) 40 MG capsule Take 40 mg by mouth daily.   Yes [provider]  potassium citrate (UROCIT-K) 10 MEQ (1080 MG) SR tablet Take 2 tablets by mouth 2 (two) times daily. 09/02/16  Yes [provider]  torsemide (DEMADEX) 20 MG tablet Take 40 mg by mouth 2 (two) times daily. 06/19/16  Yes [provider]  TRADJENTA 5 MG TABS tablet Take 5 mg by mouth daily.    Yes [provider]  traZODone (DESYREL) 50 MG tablet Take 50-100 mg by mouth at bedtime as needed for sleep.    Yes [provider]  Turmeric 500 MG CAPS Take 1 capsule by mouth daily.  Yes [provider]    Review of Systems  Constitutional: Positive for fatigue. Negative for appetite change.  HENT: Positive for rhinorrhea. Negative for congestion and sore throat.   Eyes: Negative.   Respiratory: Negative for cough, chest tightness and shortness of breath.   Cardiovascular: Positive for leg swelling. Negative for chest pain and palpitations.  Gastrointestinal: Negative for abdominal distention and abdominal pain.  Endocrine: Negative.   Genitourinary: Negative.   Musculoskeletal: Negative for back pain and neck pain.  Skin: Negative.   Allergic/Immunologic: Negative.   Neurological: Negative for dizziness and light-headedness.  Hematological: Negative for adenopathy. Does not bruise/bleed easily.  Psychiatric/Behavioral: Positive for sleep disturbance (sleeping during the day/awake at night). Negative for dysphoric mood. The patient is not nervous/anxious.    Vitals:   09/19/16 1327  BP: 130/78  Pulse: 93  Resp: 20  SpO2: 97%  Weight: 285 lb 8 oz (129.5 kg)  Height: 5' 3"  (1.6 m)   Wt Readings from Last 3 Encounters:  09/19/16 285 lb 8 oz (129.5 kg)  09/06/16 289 lb 6.4 oz (131.3 kg)  08/25/16 279 lb 8 oz (126.8 kg)   Lab  Results  Component Value Date   CREATININE 0.87 08/25/2016   CREATININE 0.91 06/22/2016   CREATININE 0.93 06/15/2016    Physical Exam  Constitutional: She is oriented to person, place, and time. She appears well-developed and well-nourished.  HENT:  Head: Normocephalic and atraumatic.  Neck: Normal range of motion. Neck supple. No JVD present.  Cardiovascular: Normal rate and regular rhythm.   Pulmonary/Chest: Effort normal. She has wheezes in the right lower field and the left lower field. She has no rales.  Abdominal: Soft. She exhibits no distension. There is no tenderness.  Musculoskeletal: She exhibits edema (2+ pitting edema in bilateral lower legs). She exhibits no tenderness.  Neurological: She is alert and oriented to person, place, and time.  Skin: Skin is warm and dry.  Psychiatric: She has a normal mood and affect. Her behavior is normal. Thought content normal.  Nursing note and vitals reviewed.    Assessment & Plan:  1: Chronic heart failure with preserved ejection fraction- - NYHA class III - continues to have fluid overload today with 2+ pitting edema - weight up 6 pounds since last visit and she is weighing daily. Reminded her to call for an overnight weight gain of 2 lbs or a weekly weight gain of 5 lbs - not adding salt and using a salt substitute. Using meals on wheels for food; reminded her to follow a 2000 mg sodium diet - cramping in legs and hands have improved; continue torsemide 53m BID - drinking about 48 ounces of fluid daily - PRN follow-up with cardiologist ([name] -Alvis Lemmingscurrently coming into the home - encouraged her to increase her activity - encouraged her to elevate her legs when at home  2: HTN- - BP looks good today - sees PCP July 2018  3: Diabetes- - checks daily but does not remember glucose numbers - glucose in the office was 65. She says that she ate a bowl of cereal 2 hours ago and took 120 units of U500 insulin - gave crackers  and peanut butter while in the office as she had another appointment to go to afterwards. Rechecked it and it was 635- counseled on the importance of eating with U500 insulin  - has endocrinology appointment scheduled for sometime September 2018   4: COPD- - continue inhalers and Daliresp - wears  2L of oxygen when at home - sees pulmonologist [name]) July 2018  Patient did not bring her medications nor a list. Each medication was verbally reviewed with the patient and she was encouraged to bring the bottles to every visit to confirm accuracy of list.  Return in 2 months or sooner for any questions/problems before then.

## 2016-09-24 ENCOUNTER — Encounter: Payer: Self-pay | Admitting: *Deleted

## 2016-09-24 ENCOUNTER — Emergency Department
Admission: EM | Admit: 2016-09-24 | Discharge: 2016-09-24 | Disposition: A | Discharge status: Home / Self Care | Payer: Medicare Other | Attending: Emergency Medicine | Admitting: Emergency Medicine

## 2016-09-24 DIAGNOSIS — J449 Chronic obstructive pulmonary disease, unspecified: Secondary | ICD-10-CM | POA: Diagnosis not present

## 2016-09-24 DIAGNOSIS — E119 Type 2 diabetes mellitus without complications: Secondary | ICD-10-CM | POA: Insufficient documentation

## 2016-09-24 DIAGNOSIS — I10 Essential (primary) hypertension: Secondary | ICD-10-CM | POA: Diagnosis not present

## 2016-09-24 DIAGNOSIS — Z791 Long term (current) use of non-steroidal anti-inflammatories (NSAID): Secondary | ICD-10-CM | POA: Insufficient documentation

## 2016-09-24 DIAGNOSIS — R1907 Generalized intra-abdominal and pelvic swelling, mass and lump: Secondary | ICD-10-CM | POA: Diagnosis present

## 2016-09-24 DIAGNOSIS — E039 Hypothyroidism, unspecified: Secondary | ICD-10-CM | POA: Insufficient documentation

## 2016-09-24 DIAGNOSIS — Z79899 Other long term (current) drug therapy: Secondary | ICD-10-CM | POA: Insufficient documentation

## 2016-09-24 DIAGNOSIS — J45909 Unspecified asthma, uncomplicated: Secondary | ICD-10-CM | POA: Diagnosis not present

## 2016-09-24 DIAGNOSIS — L02211 Cutaneous abscess of abdominal wall: Secondary | ICD-10-CM | POA: Insufficient documentation

## 2016-09-24 DIAGNOSIS — Z87891 Personal history of nicotine dependence: Secondary | ICD-10-CM | POA: Diagnosis not present

## 2016-09-24 LAB — GLUCOSE, CAPILLARY: Glucose-Capillary: 111 mg/dL — ABNORMAL HIGH (ref 65–99)

## 2016-09-24 MED ORDER — LIDOCAINE HCL (PF) 1 % IJ SOLN
5.0000 mL | Freq: Once | INTRAMUSCULAR | Status: AC
  Administered 2016-09-24: 5 mL via INTRADERMAL
  Filled 2016-09-24: qty 5

## 2016-09-24 MED ORDER — HYDROCODONE-ACETAMINOPHEN 5-325 MG PO TABS: 1.0000 | ORAL_TABLET | ORAL | 0 refills | Status: DC | PRN

## 2016-09-24 NOTE — ED Triage Notes (Signed)
Pt to ED with abscess to left side of abd. Pt reports the area began to irritate her over a week ago and then became purple in color. Pt showed area to dr. Adonis Huguenin and reports he placed her on an antibiotic that she can not remember the name of at this time. Pt reports today the abscess began weeping clear fluid and then began bleeding. No fevers reported. Small amount of red drainage noted when pressure applied to site. bandage applied.

## 2016-09-24 NOTE — Discharge Instructions (Signed)
Please schedule a follow up appointment with either Dr. Adonis Huguenin or Dr. Posey Pronto.  Continue the antibiotic as prescribed. Continue the chlorhexidine as prescribed as well. You may also consider purchasing a Probiotic at the pharmacy to help with diarrhea--ask the pharmacist to show you the available brands. Return to the ER for symptoms that change or worsen or for new concerns if you are unable to see the someone in the surgeon's office or primary care.

## 2016-09-24 NOTE — ED Provider Notes (Signed)
[facility] Emergency Department Provider Note  ____________________________________________  Time seen: Approximately 5:35 PM  I have reviewed the triage vital signs and the nursing notes.   HISTORY  Chief Complaint Abscess   HPI [name] [name] is a 61 y.o. female who presents to the emergency department for evaluation of abscess that has bled since yesterday. She has a long history of hidradenitis for which she is being treated with minocycline and chlorhexadine. She states that about 2 weeks ago an area on her abdomen became very tender and has become erythematous and purplish. Yesterday, the area has been weeping a bloody drainage. She denies fever or nausea.  Past Medical History:  Diagnosis Date  . Allergic rhinitis   . Allergy   . Back pain   . COPD (chronic obstructive pulmonary disease) ([place])   . Degenerative joint disease of knee, left   . Depression   . Diabetes mellitus without complication ([place])   . Edema   . Elevated lipids   . GERD (gastroesophageal reflux disease)   . Hidradenitis   . History of colonic polyps   . Hypertension   . Hypothyroidism   . IBS (irritable bowel syndrome)   . Insomnia   . Obesity   . Oxygen dependent   . Pneumonia   . PVD (peripheral vascular disease) ([place])   . Sinusitis, chronic   . Sleep apnea   . Thyroid disease   . Vaginitis, atrophic   . Vertigo   . Vitamin D deficiency     Patient Active Problem List   Diagnosis Date Noted  . COPD (chronic obstructive pulmonary disease) ([place]) 08/25/2016  . Acute on chronic heart failure with normal ejection fraction ([place]) 08/20/2016  . Chronic diastolic heart failure ([place]) 06/02/2016  . Bilateral lower leg cellulitis 05/23/2016  . Leukocytosis 05/23/2016  . Iron deficiency anemia 05/23/2016  . Thrombocytosis ([place]) 05/23/2016  . COPD with acute exacerbation ([place]) 05/21/2016  . Pulmonary hypertension ([place]) 12/14/2015  . Atypical chest pain 10/29/2015  .  Pneumonia 09/08/2015  . Breast abscess 06/02/2015  . Abnormal mammogram of both breasts 02/25/2015  . Hidradenitis 12/30/2014  . Diabetes ([place]) 12/30/2014  . Essential hypertension 12/30/2014  . Asthma 12/30/2014    Past Surgical History:  Procedure Laterality Date  . ABDOMINAL HYSTERECTOMY    . AXILLARY HIDRADENITIS EXCISION    . CESAREAN SECTION     x 2  . CHOLECYSTECTOMY    . HEEL SPUR EXCISION N/A   . HYDRADENITIS EXCISION Right 12/31/2015   Procedure: EXCISION HIDRADENITIS AXILLA;  Surgeon: Clayburn Pert, MD;  Location: [facility] ORS;  Service: General;  Laterality: Right;  . TONSILLECTOMY      Prior to Admission medications   Medication Sig Start Date End Date Taking? Authorizing Provider  albuterol (PROVENTIL HFA;VENTOLIN HFA) 108 (90 Base) MCG/ACT inhaler Inhale 2 puffs into the lungs every 6 (six) hours as needed for wheezing or shortness of breath.    [provider]  albuterol (PROVENTIL) (2.5 MG/3ML) 0.083% nebulizer solution Take 2.5 mg by nebulization every 6 (six) hours as needed for wheezing or shortness of breath.     [provider]  atorvastatin (LIPITOR) 20 MG tablet Take 20 mg by mouth at bedtime.     [provider]  benzonatate (TESSALON) 100 MG capsule Take 100 mg by mouth 3 (three) times daily as needed for cough.    [provider]  budesonide-formoterol (SYMBICORT) 160-4.5 MCG/ACT inhaler Inhale 2 puffs into the lungs 2 (  two) times daily.    [provider]  Chlorhexidine Gluconate 4 % SOLN Apply 1 application topically 2 (two) times daily. 09/06/16   Clayburn Pert, MD  Cinnamon 500 MG capsule Take 500 mg by mouth daily.    [provider]  clindamycin (CLINDAGEL) 1 % gel Apply 1 application topically 2 (two) times daily.    [provider]  DALIRESP 250 MCG TABS Take 1 tablet by mouth daily.  08/10/16   [provider]  doxepin (SINEQUAN) 50 MG capsule Take 50 mg by mouth at bedtime.     [provider]  DULoxetine (CYMBALTA) 20 MG capsule Take 20 mg by mouth daily.    [provider]  fluconazole (DIFLUCAN) 150 MG tablet Take 150 mg by mouth daily.    [provider]  fluocinonide (LIDEX) 0.05 % external solution Apply 1 application topically 2 (two) times daily.    [provider]  gabapentin (NEURONTIN) 100 MG capsule Take 200 mg by mouth at bedtime.     [provider]  glucose blood test strip TEST three times a day 12/30/15   [provider]  HUMULIN R U-500 KWIKPEN 500 UNIT/ML injection Inject 120 Units into the skin 2 (two) times daily with a meal.     [provider]  HYDROcodone-acetaminophen (NORCO/VICODIN) 5-325 MG tablet Take 1 tablet by mouth every 4 (four) hours as needed for moderate pain. 09/24/16 09/24/17  Alyce Inscore, Johnette Abraham B, FNP  ketoconazole (NIZORAL) 2 % shampoo Apply 1 application topically 2 (two) times a week.    [provider]  Lancets Misc. (ACCU-CHEK FASTCLIX LANCET) KIT  12/27/15   [provider]  levothyroxine (SYNTHROID, LEVOTHROID) 200 MCG tablet Take 200 mcg by mouth daily before breakfast.     [provider]  meclizine (ANTIVERT) 25 MG tablet Take 25 mg by mouth 3 (three) times daily as needed for dizziness.     [provider]  minocycline (MINOCIN,DYNACIN) 100 MG capsule Take 1 capsule (100 mg total) by mouth daily. 09/06/16 11/05/16  Clayburn Pert, MD  mometasone (NASONEX) 50 MCG/ACT nasal spray Place 2 sprays into both nostrils daily.     [provider]  montelukast (SINGULAIR) 10 MG tablet Take 10 mg by mouth at bedtime.     [provider]  Multiple Vitamins-Minerals (CENTRAVITES 50 PLUS PO) Take 1 tablet by mouth daily.    [provider]  naphazoline-glycerin (CLEAR EYES) 0.012-0.2 % SOLN Place 1-2 drops into both eyes 4 (four) times daily as needed for irritation. 05/23/16   Theodoro Grist, MD  olopatadine (PATANOL) 0.1 %  ophthalmic solution Place 1 drop into both eyes 2 (two) times daily.    [provider]  omeprazole (PRILOSEC) 40 MG capsule Take 40 mg by mouth daily.    [provider]  potassium citrate (UROCIT-K) 10 MEQ (1080 MG) SR tablet Take 2 tablets by mouth 2 (two) times daily. 09/02/16   [provider]  torsemide (DEMADEX) 20 MG tablet Take 40 mg by mouth 2 (two) times daily. 06/19/16   [provider]  TRADJENTA 5 MG TABS tablet Take 5 mg by mouth daily.     [provider]  traZODone (DESYREL) 50 MG tablet Take 50-100 mg by mouth at bedtime as needed for sleep.     [provider]  Turmeric 500 MG CAPS Take 1 capsule by mouth daily.    [provider]    Allergies Codeine  Family History  Problem Relation Age of Onset  . Rashes / Skin problems Father   . Hypertension Father   . Heart disease Father   . Breast cancer Paternal Grandmother   . COPD Mother     Social History Social History  Substance Use Topics  . Smoking status: Former Smoker    Quit date: 12/23/1994  . Smokeless tobacco: Never Used  . Alcohol use No    Review of Systems  Constitutional: Negative for fever.  Respiratory: Negative for shortness of breath.  Musculoskeletal: Negative for myalgias.  Skin: Positive for tender abscess on the abdomen. Neurological: No change from baseline. ____________________________________________   PHYSICAL EXAM:  VITAL SIGNS: ED Triage Vitals  Enc Vitals Group     BP 09/24/16 1650 (!) 117/55     Pulse Rate 09/24/16 1650 91     Resp 09/24/16 1650 18     Temp 09/24/16 1650 98.8 F (37.1 C)     Temp Source 09/24/16 1650 Oral     SpO2 09/24/16 1650 95 %     Weight 09/24/16 1650 284 lb (128.8 kg)     Height 09/24/16 1650 5' 3"  (1.6 m)     Head Circumference --      Peak Flow --      Pain Score 09/24/16 1700 3     Pain Loc --      Pain Edu? --      Excl. in [place]? --      Constitutional: Chronically ill  appearing. Eyes: Conjunctivae are clear without discharge or drainage. Nose: No rhinorrhea. Mouth/Throat: Airway is patent. Neck: Active, full range of motion.  Cardiovascular: Capillary refill <3 seconds. Respiratory: Even and unlabored. Musculoskeletal: Full, range of motion throughout. Neurologic: Awake, alert, and oriented x 4 Skin:  Fluctuant, erythematous lesion on the left lower abdomen with Moderate amount of Serosanguineous drainage.   ____________________________________________   LABS (all labs ordered are listed, but only abnormal results are displayed)  Labs Reviewed  GLUCOSE, CAPILLARY - Abnormal; Notable for the following:       Result Value   Glucose-Capillary 111 (*)    All other components within normal limits  CBG MONITORING, ED   ____________________________________________  EKG   ____________________________________________  RADIOLOGY   ____________________________________________   PROCEDURES  Procedure(s) performed:  INCISION AND DRAINAGE Performed by: Sherrie George Consent: Verbal consent obtained. Risks and benefits: risks, benefits and alternatives were discussed Type: abscess  Body area: left lower abdomen  Anesthesia: local infiltration  Incision was made with a scalpel.  Local anesthetic: lidocaine 1% without epinephrine  Anesthetic total: 4 ml  Complexity: complex  Blunt dissection to break up loculations  Drainage: small amount of purulent drainage followed by a moderate size blood clot  Packing material: 1/4 in iodoform gauze  Patient tolerance: Patient tolerated the procedure well with no immediate complications.    ____________________________________________   INITIAL IMPRESSION / ASSESSMENT AND PLAN / ED COURSE  [name] [name] is a 61 y.o. female who presents to the emergency department for evaluation and treatment of an abscess that has formed on the left lower abdomen over the past couple weeks. She is  followed by surgery as well as her primary care provider for hidradenitis. She is currently taking minocycline and using chlorhexidine. The areas under her left axilla and left breast for which she was evaluated by surgery on September 06, 2016 appear to be improving and the patient is no longer tender in those areas.She states the diarrhea is always a  side effect for any antibiotic that she is taking, however states that since changing to minocycline the diarrhea has been less. She was encouraged to use a probiotic to help balance her symptoms. Blood glucose checked this evening was 111 and she states that this is average for her. She was advised to see the PCP or surgeon within the next 3 days to remove the packing or return to the ER if unable to schedule an appointment.   Pertinent labs & imaging results that were available during my care of the patient were reviewed by me and considered in my medical decision making (see chart for details). ____________________________________________   FINAL CLINICAL IMPRESSION(S) / ED DIAGNOSES  Final diagnoses:  Abscess of skin of abdomen    Discharge Medication List as of 09/24/2016  6:16 PM    START taking these medications   Details  HYDROcodone-acetaminophen (NORCO/VICODIN) 5-325 MG tablet Take 1 tablet by mouth every 4 (four) hours as needed for moderate pain., Starting Sun 09/24/2016, Until Mon 09/24/2017, Print        If controlled substance prescribed during this visit, 12 month history viewed on the [place] prior to issuing an initial prescription for Schedule II or III opiod.   Note:  This document was prepared using Dragon voice recognition software and may include unintentional dictation errors.    Victorino Dike, FNP 09/25/16 1655    Harvest Dark, MD 09/27/16 [phone]

## 2016-09-24 NOTE — ED Notes (Signed)
Pt with left lower abd wound that is bleeding. Wound is purple around edges. Pt reports wound has been there x 2 weeks.

## 2016-09-26 ENCOUNTER — Ambulatory Visit (INDEPENDENT_AMBULATORY_CARE_PROVIDER_SITE_OTHER): Payer: Medicare Other | Admitting: General Surgery

## 2016-09-26 ENCOUNTER — Encounter: Payer: Self-pay | Admitting: General Surgery

## 2016-09-26 VITALS — BP 110/66 | HR 101 | Temp 98.0°F | Ht 63.0 in | Wt 287.4 lb

## 2016-09-26 DIAGNOSIS — L732 Hidradenitis suppurativa: Secondary | ICD-10-CM

## 2016-09-26 NOTE — Patient Instructions (Signed)
Please continue to change the dressing twice daily. Please see your follow up appointment listed below.

## 2016-09-26 NOTE — Progress Notes (Signed)
Outpatient Surgical Follow Up  09/26/2016  [name] [name] is an 61 y.o. female.   Chief Complaint  Patient presents with  . Follow-up    Hidradenitis-seen in ED 09/24/16    HPI: 61 year old female who is well known to the surgery department returns to clinic for follow-up for her hidradenitis. She is seen in the emergency department over the weekend and had an area of her left lower abdominal wall I&D by an ER practitioner. She states since that time the soreness has continued to improve but continues to drain. She also has numerous areas of hidradenitis that we are currently following. The only area of continued active drainage other than the I&D site is in her right groin. She states that her left breast, bilateral axilla, posterior neck areas all appear to be responding to therapy without soreness or active drainage. She denies any current fevers, chills, nausea, vomiting, chest pain, shortness of breath, diarrhea, constipation. She is still on her antibiotic therapy both topical and oral.  Past Medical History:  Diagnosis Date  . Allergic rhinitis   . Allergy   . Back pain   . COPD (chronic obstructive pulmonary disease) ([place])   . Degenerative joint disease of knee, left   . Depression   . Diabetes mellitus without complication ([place])   . Edema   . Elevated lipids   . GERD (gastroesophageal reflux disease)   . Hidradenitis   . History of colonic polyps   . Hypertension   . Hypothyroidism   . IBS (irritable bowel syndrome)   . Insomnia   . Obesity   . Oxygen dependent   . Pneumonia   . PVD (peripheral vascular disease) ([place])   . Sinusitis, chronic   . Sleep apnea   . Thyroid disease   . Vaginitis, atrophic   . Vertigo   . Vitamin D deficiency     Past Surgical History:  Procedure Laterality Date  . ABDOMINAL HYSTERECTOMY    . AXILLARY HIDRADENITIS EXCISION    . CESAREAN SECTION     x 2  . CHOLECYSTECTOMY    . HEEL SPUR EXCISION N/A   . HYDRADENITIS EXCISION Right  12/31/2015   Procedure: EXCISION HIDRADENITIS AXILLA;  Surgeon: Clayburn Pert, MD;  Location: [facility] ORS;  Service: General;  Laterality: Right;  . TONSILLECTOMY      Family History  Problem Relation Age of Onset  . Rashes / Skin problems Father   . Hypertension Father   . Heart disease Father   . Breast cancer Paternal Grandmother   . COPD Mother     Social History:  reports that she quit smoking about 21 years ago. She has never used smokeless tobacco. She reports that she does not drink alcohol or use drugs.  Allergies:  Allergies  Allergen Reactions  . Codeine Itching    Medications reviewed.    ROS A multipoint review of systems was completed, all pertinent positives and negatives are documented in the history of present illness and remainder are negative   BP 110/66   Pulse (!) 101   Temp 98 F (36.7 C) (Oral)   Ht 5\' 3"  (1.6 m)   Wt 130.4 kg (287 lb 6.4 oz)   BMI 50.91 kg/m   Physical Exam Gen.: No acute distress Neck: Supple and nontender. Chest: Clear to auscultation Heart: Regular rate and rhythm Abdomen: Very large, soft, nontender Skin: Multiple areas of hidradenitis. Left lower abdominal area with packing in place. No evidence of spreading erythema or  purulence. Right groin skin openings of hidradenitis with healthy-appearing granulation tissue without evidence of infection. Left breast, bilateral axilla, left posterior neck scarring from hidradenitis without evidence of active infection or drainage.    Results for orders placed or performed during the hospital encounter of 09/24/16 (from the past 48 hour(s))  Glucose, capillary     Status: Abnormal   Collection Time: 09/24/16  5:45 PM  Result Value Ref Range   Glucose-Capillary 111 (H) 65 - 99 mg/dL   No results found.  Assessment/Plan:  1. Hidradenitis 61 year old female with hidradenitis of multiple locations. Packing removed from the left lower abdominal wound today in clinic. Discussed  continuing her antibiotic therapy. Discussed continuing local wound care. Counseled that should there be any signs of worsening infection she is to call the clinic immediately for follow-up. Otherwise I'll see her back in clinic in approximately 2 weeks for additional wound check.  A total of 15 minutes was used on this encounter with greater than 50% used for counseling and coordination of care.    Clayburn Pert, MD FACS General Surgeon  09/26/2016,9:22 AM

## 2016-09-30 ENCOUNTER — Other Ambulatory Visit: Payer: Self-pay | Admitting: Family

## 2016-10-10 ENCOUNTER — Telehealth: Payer: Self-pay | Admitting: Podiatry

## 2016-10-10 NOTE — Telephone Encounter (Signed)
I'm calling to check on the status of my shoe order. It has been at least 6 weeks or so and I have not heard anything. If you would please call me back. Thank you.

## 2016-10-11 NOTE — Telephone Encounter (Signed)
Returned call to patient and had to leave voice mail that per [employer], her diabetic shoes are on backorder until 10/17/15.   I informed her via voice mail to follow up with me next week to see if her shoes have come in

## 2016-10-13 ENCOUNTER — Encounter: Payer: Medicare Other | Admitting: General Surgery

## 2016-10-21 ENCOUNTER — Emergency Department: Payer: Medicare Other

## 2016-10-21 ENCOUNTER — Emergency Department
Admission: EM | Admit: 2016-10-21 | Discharge: 2016-10-22 | Disposition: A | Discharge status: Home / Self Care | Payer: Medicare Other | Attending: Emergency Medicine | Admitting: Emergency Medicine

## 2016-10-21 DIAGNOSIS — N3090 Cystitis, unspecified without hematuria: Secondary | ICD-10-CM

## 2016-10-21 DIAGNOSIS — M545 Low back pain: Secondary | ICD-10-CM

## 2016-10-21 DIAGNOSIS — I110 Hypertensive heart disease with heart failure: Secondary | ICD-10-CM | POA: Diagnosis not present

## 2016-10-21 DIAGNOSIS — M5440 Lumbago with sciatica, unspecified side: Secondary | ICD-10-CM | POA: Diagnosis not present

## 2016-10-21 DIAGNOSIS — I5032 Chronic diastolic (congestive) heart failure: Secondary | ICD-10-CM | POA: Insufficient documentation

## 2016-10-21 DIAGNOSIS — R319 Hematuria, unspecified: Secondary | ICD-10-CM | POA: Diagnosis present

## 2016-10-21 DIAGNOSIS — Z87891 Personal history of nicotine dependence: Secondary | ICD-10-CM | POA: Insufficient documentation

## 2016-10-21 DIAGNOSIS — Z79899 Other long term (current) drug therapy: Secondary | ICD-10-CM | POA: Diagnosis not present

## 2016-10-21 DIAGNOSIS — J449 Chronic obstructive pulmonary disease, unspecified: Secondary | ICD-10-CM | POA: Insufficient documentation

## 2016-10-21 DIAGNOSIS — E119 Type 2 diabetes mellitus without complications: Secondary | ICD-10-CM | POA: Insufficient documentation

## 2016-10-21 DIAGNOSIS — Z794 Long term (current) use of insulin: Secondary | ICD-10-CM | POA: Diagnosis not present

## 2016-10-21 DIAGNOSIS — J45909 Unspecified asthma, uncomplicated: Secondary | ICD-10-CM | POA: Diagnosis not present

## 2016-10-21 DIAGNOSIS — E039 Hypothyroidism, unspecified: Secondary | ICD-10-CM | POA: Diagnosis not present

## 2016-10-21 DIAGNOSIS — N3091 Cystitis, unspecified with hematuria: Secondary | ICD-10-CM | POA: Diagnosis not present

## 2016-10-21 LAB — URINALYSIS, COMPLETE (UACMP) WITH MICROSCOPIC
BACTERIA UA: NONE SEEN
Bilirubin Urine: NEGATIVE
Glucose, UA: >=500 mg/dL — AB
Hgb urine dipstick: NEGATIVE
KETONES UR: 20 mg/dL — AB
LEUKOCYTES UA: NEGATIVE
Nitrite: NEGATIVE
PH: 6 (ref 5.0–8.0)
Protein, ur: NEGATIVE mg/dL
SPECIFIC GRAVITY, URINE: 1.018 (ref 1.005–1.030)

## 2016-10-21 LAB — COMPREHENSIVE METABOLIC PANEL
ALBUMIN: 3.3 g/dL — AB (ref 3.5–5.0)
ALT: 13 U/L — ABNORMAL LOW (ref 14–54)
ANION GAP: 9 (ref 5–15)
AST: 28 U/L (ref 15–41)
Alkaline Phosphatase: 124 U/L (ref 38–126)
BILIRUBIN TOTAL: 0.6 mg/dL (ref 0.3–1.2)
BUN: 10 mg/dL (ref 6–20)
CO2: 28 mmol/L (ref 22–32)
Calcium: 9.2 mg/dL (ref 8.9–10.3)
Chloride: 99 mmol/L — ABNORMAL LOW (ref 101–111)
Creatinine, Ser: 0.89 mg/dL (ref 0.44–1.00)
GFR calc Af Amer: >60 mL/min (ref >60)
GFR calc non Af Amer: >60 mL/min (ref >60)
GLUCOSE: 342 mg/dL — AB (ref 65–99)
POTASSIUM: 4.2 mmol/L (ref 3.5–5.1)
SODIUM: 136 mmol/L (ref 135–145)
TOTAL PROTEIN: 7 g/dL (ref 6.5–8.1)

## 2016-10-21 LAB — CBC WITH DIFFERENTIAL/PLATELET
BASOS ABS: 0.1 10*3/uL (ref 0–0.1)
Basophils Relative: 1 %
EOS PCT: 1 %
Eosinophils Absolute: 0.2 10*3/uL (ref 0–0.7)
HEMATOCRIT: 33.8 % — AB (ref 35.0–47.0)
Hemoglobin: 10.7 g/dL — ABNORMAL LOW (ref 12.0–16.0)
LYMPHS ABS: 1.1 10*3/uL (ref 1.0–3.6)
LYMPHS PCT: 8 %
MCH: 23.8 pg — ABNORMAL LOW (ref 26.0–34.0)
MCHC: 31.7 g/dL — AB (ref 32.0–36.0)
MCV: 75.1 fL — AB (ref 80.0–100.0)
MONO ABS: 0.5 10*3/uL (ref 0.2–0.9)
MONOS PCT: 4 %
NEUTROS ABS: 11.6 10*3/uL — AB (ref 1.4–6.5)
Neutrophils Relative %: 86 %
PLATELETS: 352 10*3/uL (ref 150–440)
RBC: 4.5 MIL/uL (ref 3.80–5.20)
RDW: 17.7 % — AB (ref 11.5–14.5)
WBC: 13.4 10*3/uL — ABNORMAL HIGH (ref 3.6–11.0)

## 2016-10-21 MED ORDER — SODIUM CHLORIDE 0.9 % IV BOLUS (SEPSIS)
1000.0000 mL | Freq: Once | INTRAVENOUS | Status: AC
  Administered 2016-10-21: 1000 mL via INTRAVENOUS

## 2016-10-21 NOTE — ED Notes (Signed)
Pt unable to void at this time, provided with collection "hat" for toilet and sterile container;

## 2016-10-21 NOTE — ED Triage Notes (Signed)
Pt says she's having left lower back pain for 3 weeks; hematuria for 2 weeks; denies dysuria; denies fever; pt in no acute distress;

## 2016-10-22 ENCOUNTER — Emergency Department: Payer: Medicare Other

## 2016-10-22 DIAGNOSIS — N3091 Cystitis, unspecified with hematuria: Secondary | ICD-10-CM | POA: Diagnosis not present

## 2016-10-22 LAB — GLUCOSE, CAPILLARY: GLUCOSE-CAPILLARY: 364 mg/dL — AB (ref 65–99)

## 2016-10-22 MED ORDER — TRAMADOL HCL 50 MG PO TABS: 50.0000 mg | ORAL_TABLET | Freq: Four times a day (QID) | ORAL | 0 refills | Status: AC | PRN

## 2016-10-22 MED ORDER — CEPHALEXIN 500 MG PO CAPS: 500.0000 mg | ORAL_CAPSULE | Freq: Four times a day (QID) | ORAL | 0 refills | Status: AC

## 2016-10-22 MED ORDER — CEPHALEXIN 500 MG PO CAPS
500.0000 mg | ORAL_CAPSULE | Freq: Once | ORAL | Status: AC
  Administered 2016-10-22: 500 mg via ORAL
  Filled 2016-10-22: qty 1

## 2016-10-22 MED ORDER — TRAMADOL HCL 50 MG PO TABS
50.0000 mg | ORAL_TABLET | Freq: Once | ORAL | Status: AC
  Administered 2016-10-22: 50 mg via ORAL
  Filled 2016-10-22: qty 1

## 2016-10-22 NOTE — ED Notes (Signed)

## 2016-10-22 NOTE — ED Provider Notes (Signed)
[facility] Emergency Department Provider Note   ____________________________________________   I have reviewed the triage vital signs and the nursing notes.   HISTORY  Chief Complaint Back Pain and Hematuria    HPI [name] [name] is a 61 y.o. female presents to the emergency department with 2 week history of hematuria and 3 week history of left lower back pain. Patient denies dysuria however notes increased difficulty passing urine over the last several weeks. Patient states she has been visualizing blood in her urine when she urinates first thing in the morning. Patient is currently on minocycline that her primary care provider prescribed when she followed up for wound recheck regarding an abscess drainage she had during an emergency department visit from 09/24/2016. Patient reports in addition to above symptoms persistent low-grade fever and feeling generally fatigued. Patient denies headache, vision changes, chest pain, chest tightness, shortness of breath, abdominal pain, nausea and vomiting.  Past Medical History:  Diagnosis Date  . Allergic rhinitis   . Allergy   . Back pain   . COPD (chronic obstructive pulmonary disease) ([place])   . Degenerative joint disease of knee, left   . Depression   . Diabetes mellitus without complication ([place])   . Edema   . Elevated lipids   . GERD (gastroesophageal reflux disease)   . Hidradenitis   . History of colonic polyps   . Hypertension   . Hypothyroidism   . IBS (irritable bowel syndrome)   . Insomnia   . Obesity   . Oxygen dependent   . Pneumonia   . PVD (peripheral vascular disease) ([place])   . Sinusitis, chronic   . Sleep apnea   . Thyroid disease   . Vaginitis, atrophic   . Vertigo   . Vitamin D deficiency     Patient Active Problem List   Diagnosis Date Noted  . COPD (chronic obstructive pulmonary disease) ([place]) 08/25/2016  . Acute on chronic heart failure with normal ejection fraction ([place])  08/20/2016  . Chronic diastolic heart failure ([place]) 06/02/2016  . Bilateral lower leg cellulitis 05/23/2016  . Leukocytosis 05/23/2016  . Iron deficiency anemia 05/23/2016  . Thrombocytosis ([place]) 05/23/2016  . COPD with acute exacerbation ([place]) 05/21/2016  . Pulmonary hypertension ([place]) 12/14/2015  . Atypical chest pain 10/29/2015  . Pneumonia 09/08/2015  . Breast abscess 06/02/2015  . Abnormal mammogram of both breasts 02/25/2015  . Hidradenitis 12/30/2014  . Diabetes ([place]) 12/30/2014  . Essential hypertension 12/30/2014  . Asthma 12/30/2014    Past Surgical History:  Procedure Laterality Date  . ABDOMINAL HYSTERECTOMY    . AXILLARY HIDRADENITIS EXCISION    . CESAREAN SECTION     x 2  . CHOLECYSTECTOMY    . HEEL SPUR EXCISION N/A   . HYDRADENITIS EXCISION Right 12/31/2015   Procedure: EXCISION HIDRADENITIS AXILLA;  Surgeon: Clayburn Pert, MD;  Location: [facility] ORS;  Service: General;  Laterality: Right;  . TONSILLECTOMY      Prior to Admission medications   Medication Sig Start Date End Date Taking? Authorizing Provider  albuterol (PROVENTIL HFA;VENTOLIN HFA) 108 (90 Base) MCG/ACT inhaler Inhale 2 puffs into the lungs every 6 (six) hours as needed for wheezing or shortness of breath.    [provider]  albuterol (PROVENTIL) (2.5 MG/3ML) 0.083% nebulizer solution Take 2.5 mg by nebulization every 6 (six) hours as needed for wheezing or shortness of breath.     [provider]  atorvastatin (LIPITOR) 20 MG tablet Take 20 mg by  mouth at bedtime.     [provider]  benzonatate (TESSALON) 100 MG capsule Take 100 mg by mouth 3 (three) times daily as needed for cough.    [provider]  budesonide-formoterol (SYMBICORT) 160-4.5 MCG/ACT inhaler Inhale 2 puffs into the lungs 2 (two) times daily.    [provider]  cephALEXin (KEFLEX) 500 MG capsule Take 1 capsule (500 mg total) by mouth 4 (four) times daily. 10/22/16 11/01/16  Heston Widener  M, PA-C  Chlorhexidine Gluconate 4 % SOLN Apply 1 application topically 2 (two) times daily. 09/06/16   Clayburn Pert, MD  Cinnamon 500 MG capsule Take 500 mg by mouth daily.    [provider]  clindamycin (CLINDAGEL) 1 % gel Apply 1 application topically 2 (two) times daily.    [provider]  DALIRESP 250 MCG TABS Take 1 tablet by mouth daily.  08/10/16   [provider]  doxepin (SINEQUAN) 50 MG capsule Take 50 mg by mouth at bedtime.    [provider]  DULoxetine (CYMBALTA) 20 MG capsule Take 20 mg by mouth daily.    [provider]  fluconazole (DIFLUCAN) 150 MG tablet Take 150 mg by mouth daily.    [provider]  fluocinonide (LIDEX) 0.05 % external solution Apply 1 application topically 2 (two) times daily.    [provider]  gabapentin (NEURONTIN) 100 MG capsule Take 200 mg by mouth at bedtime.     [provider]  glucose blood test strip TEST three times a day 12/30/15   [provider]  HUMULIN R U-500 KWIKPEN 500 UNIT/ML injection Inject 120 Units into the skin 2 (two) times daily with a meal.     [provider]  HYDROcodone-acetaminophen (NORCO/VICODIN) 5-325 MG tablet Take 1 tablet by mouth every 4 (four) hours as needed for moderate pain. 09/24/16 09/24/17  Triplett, Johnette Abraham B, FNP  ketoconazole (NIZORAL) 2 % shampoo Apply 1 application topically 2 (two) times a week.    [provider]  Lancets Misc. (ACCU-CHEK FASTCLIX LANCET) KIT  12/27/15   [provider]  levothyroxine (SYNTHROID, LEVOTHROID) 200 MCG tablet Take 200 mcg by mouth daily before breakfast.     [provider]  meclizine (ANTIVERT) 25 MG tablet Take 25 mg by mouth 3 (three) times daily as needed for dizziness.     [provider]  minocycline (MINOCIN,DYNACIN) 100 MG capsule Take 1 capsule (100 mg total) by mouth daily. 09/06/16 11/05/16  Clayburn Pert, MD  mometasone (NASONEX) 50 MCG/ACT  nasal spray Place 2 sprays into both nostrils daily.     [provider]  montelukast (SINGULAIR) 10 MG tablet Take 10 mg by mouth at bedtime.     [provider]  Multiple Vitamins-Minerals (CENTRAVITES 50 PLUS PO) Take 1 tablet by mouth daily.    [provider]  naphazoline-glycerin (CLEAR EYES) 0.012-0.2 % SOLN Place 1-2 drops into both eyes 4 (four) times daily as needed for irritation. 05/23/16   Theodoro Grist, MD  olopatadine (PATANOL) 0.1 % ophthalmic solution Place 1 drop into both eyes 2 (two) times daily.    [provider]  omeprazole (PRILOSEC) 40 MG capsule Take 40 mg by mouth daily.    [provider]  potassium citrate (UROCIT-K) 10 MEQ (1080 MG) SR tablet Take 2 tablets by mouth 2 (two) times daily. 09/02/16   [provider]  torsemide (DEMADEX) 20 MG tablet Take 40 mg by mouth 2 (two) times daily. 06/19/16  [provider]  torsemide (DEMADEX) 20 MG tablet take 2 tablets by mouth twice a day 10/01/16   Darylene Price A, FNP  TRADJENTA 5 MG TABS tablet Take 5 mg by mouth daily.     [provider]  traMADol (ULTRAM) 50 MG tablet Take 1 tablet (50 mg total) by mouth every 6 (six) hours as needed. 10/22/16 10/22/17  Enedina Pair M, PA-C  traZODone (DESYREL) 50 MG tablet Take 50-100 mg by mouth at bedtime as needed for sleep.     [provider]  Turmeric 500 MG CAPS Take 1 capsule by mouth daily.    [provider]    Allergies Codeine  Family History  Problem Relation Age of Onset  . Rashes / Skin problems Father   . Hypertension Father   . Heart disease Father   . Breast cancer Paternal Grandmother   . COPD Mother     Social History Social History  Substance Use Topics  . Smoking status: Former Smoker    Quit date: 12/23/1994  . Smokeless tobacco: Never Used  . Alcohol use No    Review of Systems Constitutional: Negative for fever/chills Eyes: No visual changes. ENT:  Negative for  sore throat and for difficulty swallowing Cardiovascular: Denies chest pain. Respiratory: Denies cough. Denies shortness of breath. Gastrointestinal: No abdominal pain.  No nausea, vomiting, diarrhea. Genitourinary: Negative for dysuria. Left side flank pain. Increased urgency.  Musculoskeletal: Negative for back pain. Skin: Negative for rash. Neurological: Negative for headaches.  Negative focal weakness or numbness. Negative for loss of consciousness. Able to ambulate. ____________________________________________   PHYSICAL EXAM:  VITAL SIGNS: Patient Vitals for the past 24 hrs:  BP Temp Temp src Pulse Resp SpO2 Height Weight  10/22/16 0054 (!) 131/55 98.1 F (36.7 C) Oral 94 18 97 % - -  10/21/16 2105 - - - - - - 5' 3"  (1.6 m) 128.8 kg (284 lb)  10/21/16 2104 (!) 150/60 99 F (37.2 C) Oral 98 18 96 % - -    Constitutional: Alert and oriented. Well appearing and in no acute distress.  Eyes: Conjunctivae are normal. PERRL. EOMI  Head: Normocephalic and atraumatic. ENT:      Ears: Canals clear. TMs intact bilaterally.      Nose: No congestion/rhinnorhea.      Mouth/Throat: Mucous membranes are moist. Neck:Supple. No thyromegaly. No stridor.  Cardiovascular: Normal rate, regular rhythm. Normal S1 and S2.  Good peripheral circulation. Respiratory: Normal respiratory effort without tachypnea or retractions. Lungs CTAB. Good air entry to the bases with no decreased or absent breath sounds. Hematological/Lymphatic/Immunological: No cervical lymphadenopathy. Cardiovascular: Normal rate, regular rhythm. Normal distal pulses. Respiratory: Normal respiratory effort. No wheezes/rales/rhonchi. Lungs CTAB with no W/R/R. Gastrointestinal: Bowel sounds 4 quadrants. Soft and nontender to palpation. No guarding or rigidity. No palpable masses. No distention.  Left side CVA tenderness. Genitourinary: Negative for dysuria. Left side flank pain. Increased urgency. Musculoskeletal: Nontender with  normal range of motion in all extremities. Neurologic: Normal speech and language. No gross focal neurologic deficits are appreciated. No gait instability. Cranial nerves: II-X intact. No sensory loss or abnormal reflexes.  Skin:  Skin is warm, dry and intact. No rash noted. Psychiatric: Mood and affect are normal. Speech and behavior are normal. Patient exhibits appropriate insight and judgement.  ____________________________________________   LABS (all labs ordered are listed, but only abnormal results are displayed)  Labs Reviewed  URINALYSIS, COMPLETE (UACMP) WITH MICROSCOPIC - Abnormal; Notable for the following:  Result Value   Color, Urine YELLOW (*)    APPearance CLEAR (*)    Glucose, UA >=500 (*)    Ketones, ur 20 (*)    Squamous Epithelial / LPF 0-5 (*)    All other components within normal limits  COMPREHENSIVE METABOLIC PANEL - Abnormal; Notable for the following:    Chloride 99 (*)    Glucose, Bld 342 (*)    Albumin 3.3 (*)    ALT 13 (*)    All other components within normal limits  CBC WITH DIFFERENTIAL/PLATELET - Abnormal; Notable for the following:    WBC 13.4 (*)    Hemoglobin 10.7 (*)    HCT 33.8 (*)    MCV 75.1 (*)    MCH 23.8 (*)    MCHC 31.7 (*)    RDW 17.7 (*)    Neutro Abs 11.6 (*)    All other components within normal limits  GLUCOSE, CAPILLARY - Abnormal; Notable for the following:    Glucose-Capillary 364 (*)    All other components within normal limits  URINE CULTURE   ____________________________________________  EKG none ____________________________________________  RADIOLOGY DG chest IMPRESSION: 1. No focal consolidation. 2. Mild peribronchial thickening may represent bronchitis or reactive small airway disease. Clinical correlation is recommended  CT renal stone study IMPRESSION: 1. No CT evidence for nephrolithiasis or obstructive uropathy. 2. No other acute intra-abdominal or pelvic process identified. 3. Colonic  diverticulosis without evidence for acute diverticulitis. 4. Moderate aorto bi-iliac atherosclerotic disease. No aneurysm.  ____________________________________________   PROCEDURES  Procedure(s) performed: no    Critical Care performed: no ____________________________________________   INITIAL IMPRESSION / ASSESSMENT AND PLAN / ED COURSE  Pertinent labs & imaging results that were available during my care of the patient were reviewed by me and considered in my medical decision making (see chart for details).  Patient presents to emergency department with left side flank pain, increased urgency. Patient will be given cephalexin for continued antibiotic coverage for the abscess wound and likely urinary infection. Urine sent for culture and results are pending. Patient also prescribed Tramadol for back pain to be taken as needed.  Patient given initial dose cephalexin during course of care tonight. Patient reassessment and vital signs reassuring at time of discharge. Patient informed of clinical course, understand medical decision-making process, and agree with plan. Patient advised to follow up with PCP as needed or return to the emergency department if symptoms return or worsen.      If controlled substance prescribed during this visit, 12 month history viewed on the [place] prior to issuing an initial prescription for Schedule II or III opiod. ____________________________________________   FINAL CLINICAL IMPRESSION(S) / ED DIAGNOSES  Final diagnoses:  Cystitis  Left low back pain, unspecified chronicity, with sciatica presence unspecified       NEW MEDICATIONS STARTED DURING THIS VISIT:  Discharge Medication List as of 10/22/2016 12:48 AM    START taking these medications   Details  cephALEXin (KEFLEX) 500 MG capsule Take 1 capsule (500 mg total) by mouth 4 (four) times daily., Starting Sun 10/22/2016, Until Wed 11/01/2016, Print    traMADol (ULTRAM) 50 MG tablet Take 1  tablet (50 mg total) by mouth every 6 (six) hours as needed., Starting Sun 10/22/2016, Until Mon 10/22/2017, Print         Note:  This document was prepared using Dragon voice recognition software and may include unintentional dictation errors.    Cadynce Garrette, Laroy Apple, PA-C 10/22/16 0125    Archie Balboa,  Carter Kitten, MD 10/22/16 1755

## 2016-10-22 NOTE — Discharge Instructions (Signed)
Take medication as prescribed. Return to emergency department if symptoms worsen and follow-up with PCP as needed.   °

## 2016-10-23 ENCOUNTER — Ambulatory Visit: Payer: Medicare Other | Admitting: General Surgery

## 2016-10-23 LAB — URINE CULTURE: CULTURE: NO GROWTH

## 2016-10-24 ENCOUNTER — Other Ambulatory Visit: Payer: Self-pay

## 2016-10-25 ENCOUNTER — Encounter: Payer: Self-pay | Admitting: General Surgery

## 2016-10-25 ENCOUNTER — Ambulatory Visit (INDEPENDENT_AMBULATORY_CARE_PROVIDER_SITE_OTHER): Payer: Medicare Other | Admitting: General Surgery

## 2016-10-25 VITALS — BP 107/68 | HR 80 | Temp 97.7°F | Ht 63.0 in

## 2016-10-25 DIAGNOSIS — L732 Hidradenitis suppurativa: Secondary | ICD-10-CM

## 2016-10-25 MED ORDER — FLUCONAZOLE 200 MG PO TABS: 200.0000 mg | ORAL_TABLET | Freq: Every day | ORAL | 2 refills | Status: DC

## 2016-10-25 NOTE — Patient Instructions (Signed)
We will send the referral to [facility] from their office will contact you to make an appointment. Please see your follow up appointment listed below.  Please pick up your medicine at the pharmacy.

## 2016-10-25 NOTE — Progress Notes (Signed)
Outpatient Surgical Follow Up  10/25/2016  [name] [name] is an 61 y.o. female.   Chief Complaint  Patient presents with  . Follow-up    Hidradenitis    HPI: 61 year old female returns to clinic for follow-up for her hidradenitis. Patient works she was in the ER recently due to her urine turning red. She is primarily in clinic today to follow-up for her multiple areas of hidradenitis. She continues to have drainage from her left breast and right groin. Her bilateral axilla and posterior neck areas has been without drainage over the last several days. She denies any recent fevers, chills, nausea, vomiting, chest pain, shortness of breath. She has been attempting to control her blood sugars better. She also reports sensation of a yeast infection from the antibiotic she's been on.  Past Medical History:  Diagnosis Date  . Allergic rhinitis   . Allergy   . Back pain   . COPD (chronic obstructive pulmonary disease) ([place])   . Degenerative joint disease of knee, left   . Depression   . Diabetes mellitus without complication ([place])   . Edema   . Elevated lipids   . GERD (gastroesophageal reflux disease)   . Hidradenitis   . History of colonic polyps   . Hypertension   . Hypothyroidism   . IBS (irritable bowel syndrome)   . Insomnia   . Obesity   . Oxygen dependent   . Pneumonia   . PVD (peripheral vascular disease) ([place])   . Sinusitis, chronic   . Sleep apnea   . Thyroid disease   . Vaginitis, atrophic   . Vertigo   . Vitamin D deficiency     Past Surgical History:  Procedure Laterality Date  . ABDOMINAL HYSTERECTOMY    . AXILLARY HIDRADENITIS EXCISION    . CESAREAN SECTION     x 2  . CHOLECYSTECTOMY    . HEEL SPUR EXCISION N/A   . HYDRADENITIS EXCISION Right 12/31/2015   Procedure: EXCISION HIDRADENITIS AXILLA;  Surgeon: Clayburn Pert, MD;  Location: [facility] ORS;  Service: General;  Laterality: Right;  . TONSILLECTOMY      Family History  Problem Relation Age of Onset   . Rashes / Skin problems Father   . Hypertension Father   . Heart disease Father   . Breast cancer Paternal Grandmother   . COPD Mother     Social History:  reports that she quit smoking about 21 years ago. She has never used smokeless tobacco. She reports that she does not drink alcohol or use drugs.  Allergies:  Allergies  Allergen Reactions  . Codeine Itching    Medications reviewed.    ROS A multipoint review of systems was completed, all pertinent positives and negatives are documented within the history of present illness and remainder are negative   BP 107/68   Pulse 80   Temp 97.7 F (36.5 C) (Oral)   Ht 5\' 3"  (1.6 m)   Physical Exam Gen.: No acute distress  chest: Clear to auscultation Heart: Regular rhythm Abdomen: Large, soft, nontender Skin: Multiple areas of hidradenitis. Right groin and left breast with minimal sanguinous drainage. No spreading erythema. Bilateral axilla and posterior neck with scarring from previous hydradenitis but no current expressible drainage.   No results found for this or any previous visit (from the past 48 hour(s)). No results found.  Assessment/Plan:  1. Hidradenitis 61 year old female with long-standing hidradenitis of multiple areas. Continues to have poorly controlled diabetes. She has not been losing much  weight and is very obese. She has however obtained from smoking. She is continuing her topical and local therapies. She understands that her problem is chronic in nature and will be a constant bowel. Given the abnormal presentation to the ER with her urine and low back pain discussed possible referral to urology. She voiced understanding. She'll follow-up in clinic in approximately 1 month for repeat examination of her hidradenitis.  A total of 15 minutes was used on this encounter with greater than 50% of use for counseling and coordination of care.   Clayburn Pert, MD FACS General Surgeon  10/25/2016,2:31 PM

## 2016-10-27 ENCOUNTER — Other Ambulatory Visit: Payer: Self-pay | Admitting: Family

## 2016-11-14 NOTE — Progress Notes (Signed)
11/15/2016 3:41 PM   [name] [name] Malachy Chamber 02-22-1956 [id number]  Referring provider: Denton Lank, MD [place]. [address] [place], [place] [address]  Chief Complaint  Patient presents with  . New Patient (Initial Visit)    Hematuria referred by Dr. Adonis Huguenin    HPI: Patient is a 61 -year-old Caucasian female who presents today as a referral from their general surgeon, Dr. Clayburn Pert, for gross hematuria.    She states that she fell in her home about one week prior to her episode of gross hematuria.  She fell forward on her head and knees, so it is unlikely she had trauma to her kidneys or bladder.      She does not have a prior history of recurrent urinary tract infections, nephrolithiasis, trauma to the genitourinary tract or malignancies of the genitourinary tract.   She does not have a family medical history of nephrolithiasis, malignancies of the genitourinary tract or hematuria.     Today, she is having symptoms of frequent urination, urgency, nocturia and incontinence.  Her UA today is unremarkable.  She is not experiencing any suprapubic pain, abdominal pain or flank pain.  She denies any recent fevers, chills, nausea or vomiting.   She underwent a CT Renal stone study on 10/21/2016 which noted no CT evidence for nephrolithiasis or obstructive uropathy.  No other acute intra-abdominal or pelvic process identified.  Colonic diverticulosis without evidence for acute diverticulitis.  Moderate aorto bi-iliac atherosclerotic disease.  No aneurysm.  She is a former smoker, with a 1 ppd history.  Quit 20 years ago.  She is not exposed to secondhand smoke.  She has not worked with Sports administrator, trichloroethylene, etc.   She has a high BMI.     PMH: Past Medical History:  Diagnosis Date  . Allergic rhinitis   . Allergy   . Back pain   . COPD (chronic obstructive pulmonary disease) ([place])   . Degenerative joint disease of knee, left   . Depression   . Diabetes mellitus  without complication ([place])   . Edema   . Elevated lipids   . GERD (gastroesophageal reflux disease)   . Hidradenitis   . History of colonic polyps   . Hypertension   . Hypothyroidism   . IBS (irritable bowel syndrome)   . Insomnia   . Obesity   . Oxygen dependent   . Pneumonia   . PVD (peripheral vascular disease) ([place])   . Sinusitis, chronic   . Sleep apnea   . Thyroid disease   . Vaginitis, atrophic   . Vertigo   . Vitamin D deficiency     Surgical History: Past Surgical History:  Procedure Laterality Date  . ABDOMINAL HYSTERECTOMY    . AXILLARY HIDRADENITIS EXCISION    . CESAREAN SECTION     x 2  . CHOLECYSTECTOMY    . HEEL SPUR EXCISION N/A   . HYDRADENITIS EXCISION Right 12/31/2015   Procedure: EXCISION HIDRADENITIS AXILLA;  Surgeon: Clayburn Pert, MD;  Location: [facility] ORS;  Service: General;  Laterality: Right;  . KNEE SURGERY Left    1998  . TONSILLECTOMY      Home Medications:  Allergies as of 11/15/2016      Reactions   Codeine Itching      Medication List       Accurate as of 11/15/16  3:41 PM. Always use your most recent med list.          ACCU-CHEK FASTCLIX LANCET Kit  albuterol (2.5 MG/3ML) 0.083% nebulizer solution Commonly known as:  PROVENTIL Take 2.5 mg by nebulization every 6 (six) hours as needed for wheezing or shortness of breath.   albuterol 108 (90 Base) MCG/ACT inhaler Commonly known as:  PROVENTIL HFA;VENTOLIN HFA Inhale 2 puffs into the lungs every 6 (six) hours as needed for wheezing or shortness of breath.   atorvastatin 20 MG tablet Commonly known as:  LIPITOR Take 20 mg by mouth at bedtime.   benzonatate 100 MG capsule Commonly known as:  TESSALON Take 100 mg by mouth 3 (three) times daily as needed for cough.   budesonide-formoterol 160-4.5 MCG/ACT inhaler Commonly known as:  SYMBICORT Inhale 2 puffs into the lungs 2 (two) times daily.   Chlorhexidine Gluconate 4 % Soln Apply 1 application topically 2 (two)  times daily.   Cinnamon 500 MG capsule Take 500 mg by mouth daily.   clindamycin 1 % gel Commonly known as:  CLINDAGEL Apply 1 application topically 2 (two) times daily.   DALIRESP 250 MCG Tabs Generic drug:  Roflumilast Take 1 tablet by mouth daily.   doxepin 50 MG capsule Commonly known as:  SINEQUAN Take 50 mg by mouth at bedtime.   DULoxetine 20 MG capsule Commonly known as:  CYMBALTA Take 20 mg by mouth daily.   fluconazole 150 MG tablet Commonly known as:  DIFLUCAN Take 150 mg by mouth daily.   fluconazole 200 MG tablet Commonly known as:  DIFLUCAN Take 1 tablet (200 mg total) by mouth daily.   gabapentin 100 MG capsule Commonly known as:  NEURONTIN Take 200 mg by mouth at bedtime.   glucose blood test strip TEST three times a day   HUMULIN R U-500 KWIKPEN 500 UNIT/ML kwikpen Generic drug:  insulin regular human CONCENTRATED Inject 120 Units into the skin 2 (two) times daily with a meal.   ketoconazole 2 % shampoo Commonly known as:  NIZORAL Apply 1 application topically 2 (two) times a week.   levothyroxine 200 MCG tablet Commonly known as:  SYNTHROID, LEVOTHROID Take 200 mcg by mouth daily before breakfast.   meclizine 25 MG tablet Commonly known as:  ANTIVERT Take 25 mg by mouth 3 (three) times daily as needed for dizziness.   mometasone 50 MCG/ACT nasal spray Commonly known as:  NASONEX Place 2 sprays into both nostrils daily.   montelukast 10 MG tablet Commonly known as:  SINGULAIR Take 10 mg by mouth at bedtime.   naphazoline-glycerin 0.012-0.2 % Soln Commonly known as:  CLEAR EYES Place 1-2 drops into both eyes 4 (four) times daily as needed for irritation.   olopatadine 0.1 % ophthalmic solution Commonly known as:  PATANOL Place 1 drop into both eyes 2 (two) times daily.   omeprazole 40 MG capsule Commonly known as:  PRILOSEC Take 40 mg by mouth daily.   potassium citrate 10 MEQ (1080 MG) SR tablet Commonly known as:   UROCIT-K TAKE 2 TABLETS BY MOUTH 2 TIMES A ADAY. NEED TO TAKE ADDITIONAL 2 TABLETS ONCE A DAY WHILE TAKING THE METOLAZONE   torsemide 20 MG tablet Commonly known as:  DEMADEX Take 40 mg by mouth 2 (two) times daily.   torsemide 20 MG tablet Commonly known as:  DEMADEX take 2 tablets by mouth twice a day   TRADJENTA 5 MG Tabs tablet Generic drug:  linagliptin Take 5 mg by mouth daily.   traMADol 50 MG tablet Commonly known as:  ULTRAM Take 1 tablet (50 mg total) by mouth every 6 (six) hours  as needed.   traZODone 50 MG tablet Commonly known as:  DESYREL Take 50-100 mg by mouth at bedtime as needed for sleep.            Discharge Care Instructions        Start     Ordered   11/15/16 0000  Urinalysis, Complete     11/15/16 1402   11/15/16 0000  CULTURE, URINE COMPREHENSIVE     11/15/16 1402   11/15/16 0000  BUN+Creat     11/15/16 1402   11/15/16 0000  CT HEMATURIA WORKUP    Question Answer Comment  Reason for Exam (SYMPTOM  OR DIAGNOSIS REQUIRED) gross hematuria   Preferred imaging location? [facility]   Is the patient pregnant? No      11/15/16 1541      Allergies:  Allergies  Allergen Reactions  . Codeine Itching    Family History: Family History  Problem Relation Age of Onset  . Rashes / Skin problems Father   . Hypertension Father   . Heart disease Father   . Breast cancer Paternal Grandmother   . COPD Mother   . Kidney cancer Neg Hx   . Bladder Cancer Neg Hx     Social History:  reports that she quit smoking about 22 years ago. She has never used smokeless tobacco. She reports that she does not drink alcohol or use drugs.  ROS: UROLOGY Frequent Urination?: Yes Hard to postpone urination?: Yes Burning/pain with urination?: No Get up at night to urinate?: Yes Leakage of urine?: Yes Urine stream starts and stops?: No Trouble starting stream?: No Do you have to strain to urinate?: No Blood in urine?: No Urinary tract infection?:  No Sexually transmitted disease?: No Injury to kidneys or bladder?: No Painful intercourse?: No Weak stream?: No Currently pregnant?: No Vaginal bleeding?: No Last menstrual period?: n  Gastrointestinal Nausea?: No Vomiting?: No Indigestion/heartburn?: Yes Diarrhea?: Yes Constipation?: No  Constitutional Fever: No Night sweats?: Yes Weight loss?: No Fatigue?: Yes  Skin Skin rash/lesions?: Yes Itching?: Yes  Eyes Blurred vision?: Yes Double vision?: No  Ears/Nose/Throat Sore throat?: No Sinus problems?: Yes  Hematologic/Lymphatic Swollen glands?: No Easy bruising?: No  Cardiovascular Leg swelling?: Yes Chest pain?: No  Respiratory Cough?: No Shortness of breath?: Yes  Endocrine Excessive thirst?: Yes  Musculoskeletal Back pain?: Yes Joint pain?: Yes  Neurological Headaches?: Yes Dizziness?: Yes  Psychologic Depression?: Yes Anxiety?: No  Physical Exam: BP (!) 102/58   Pulse (!) 101   Ht 5' (1.524 m)   Wt 278 lb 3.2 oz (126.2 kg)   BMI 54.33 kg/m   Constitutional: Obese. Alert and oriented, No acute distress. HEENT: [place] AT, moist mucus membranes. Trachea midline, no masses. Cardiovascular: No clubbing, cyanosis, or edema. Respiratory: Normal respiratory effort, no increased work of breathing. GI: Abdomen is soft, non tender, non distended, no abdominal masses. Liver and spleen not palpable.  No hernias appreciated.  Stool sample for occult testing is not indicated.   GU: No CVA tenderness.  No bladder fullness or masses.  Normal external genitalia, normal pubic hair distribution, no lesions.  Normal urethral meatus, no lesions, no prolapse, no discharge.   No urethral masses, tenderness and/or tenderness. No bladder fullness, tenderness or masses. Normal vagina mucosa, good estrogen effect, no discharge, no lesions, good pelvic support, no cystocele or rectocele noted.  No cervical motion tenderness.  Uterus is freely mobile and non-fixed.  No  adnexal/parametria masses or tenderness noted.  Anus and perineum are  without rashes or lesions.    Skin: No rashes, bruises or suspicious lesions. Lymph: No cervical or inguinal adenopathy. Neurologic: Grossly intact, no focal deficits, moving all 4 extremities. Psychiatric: Normal mood and affect.  Laboratory Data: Lab Results  Component Value Date   WBC 13.4 (H) 10/21/2016   HGB 10.7 (L) 10/21/2016   HCT 33.8 (L) 10/21/2016   MCV 75.1 (L) 10/21/2016   PLT 352 10/21/2016    Lab Results  Component Value Date   CREATININE 0.89 10/21/2016    Lab Results  Component Value Date   HGBA1C 7.0 (H) 05/21/2016     Lab Results  Component Value Date   AST 28 10/21/2016   Lab Results  Component Value Date   ALT 13 (L) 10/21/2016     Urinalysis Unremarkable.  See EPIC.  Pertinent Imaging: CLINICAL DATA:  Initial evaluation for acute flank pain, hematuria.  EXAM: CT ABDOMEN AND PELVIS WITHOUT CONTRAST  TECHNIQUE: Multidetector CT imaging of the abdomen and pelvis was performed following the standard protocol without IV contrast.  COMPARISON:  None.  FINDINGS: Lower chest: Visualized lung bases are clear.  Hepatobiliary: Limited noncontrast evaluation liver is unremarkable. Gallbladder surgically absent. No biliary dilatation.  Pancreas: Fatty infiltration of the pancreatic head and uncinate process noted. Pancreas otherwise unremarkable.  Spleen: Spleen within normal limits.  Adrenals/Urinary Tract: Adrenal glands are normal.  Kidneys equal in size without evidence for nephrolithiasis or hydronephrosis. No radiopaque calculi seen along the course of either renal collecting system. No hydroureter. Minimal age indeterminate bilateral perinephric fat stranding noted bilaterally.  Partially distended bladder within normal limits. No layering stones within the bladder lumen.  Stomach/Bowel: Stomach within normal limits. No evidence for  bowel obstruction. Colonic diverticulosis without evidence for acute diverticulitis. No acute inflammatory changes seen about the bowels.  Vascular/Lymphatic: Moderate aorto bi-iliac atherosclerotic disease. No aneurysm. No adenopathy.  Reproductive: Uterus is absent. Possible small residual right ovary noted, within normal limits. Left ovary not discretely identified.  Other: No free air or fluid.  Musculoskeletal: No acute osseus abnormality. No worrisome lytic or blastic osseous lesions.  IMPRESSION: 1. No CT evidence for nephrolithiasis or obstructive uropathy. 2. No other acute intra-abdominal or pelvic process identified. 3. Colonic diverticulosis without evidence for acute diverticulitis. 4. Moderate aorto bi-iliac atherosclerotic disease.  No aneurysm.   Electronically Signed   By: Jeannine Boga M.D.   On: 10/21/2016 23:31  I've independently reviewed the films  Assessment & Plan:    1. Gross hematuria  - I explained to the patient that there are a number of causes that can be associated with blood in the urine, such as stones, UTI's, damage to the urinary tract and/or cancer.  - At this time, I felt that the patient warranted further urologic evaluation.   The AUA guidelines state that a CT urogram is the preferred imaging study to evaluate hematuria - her CT Renal stone study could miss some urological tumors  - I explained to the patient that a contrast material will be injected into a vein and that in rare instances, an allergic reaction can result and may even life threatening   The patient denies any allergies to contrast, iodine and/or seafood and is not taking metformin.  - Her reproductive status is hysterectomy   - Following the imaging study,  I've recommended a cystoscopy. I described how this is performed, typically in an office setting with a flexible cystoscope. We described the risks, benefits, and possible side effects, the most  common of  which is a minor amount of blood in the urine and/or burning which usually resolves in 24 to 48 hours.    - The patient had the opportunity to ask questions which were answered. Based upon this discussion, the patient is willing to proceed. Therefore, I've ordered: a CT Urogram and cystoscopy.  - The patient will return following all of the above for discussion of the results.   - UA  - Urine culture  - BUN + creatinine    Return for CT Urogram report and cystoscopy.  These notes generated with voice recognition software. I apologize for typographical errors.  Zara Council, [facility] [address], [place], [place] [address] [phone]

## 2016-11-15 ENCOUNTER — Ambulatory Visit (INDEPENDENT_AMBULATORY_CARE_PROVIDER_SITE_OTHER): Payer: Medicare Other | Admitting: Urology

## 2016-11-15 ENCOUNTER — Encounter: Payer: Self-pay | Admitting: Urology

## 2016-11-15 VITALS — BP 102/58 | HR 101 | Ht 60.0 in | Wt 278.2 lb

## 2016-11-15 DIAGNOSIS — R310 Gross hematuria: Secondary | ICD-10-CM

## 2016-11-15 LAB — URINALYSIS, COMPLETE
Bilirubin, UA: NEGATIVE
GLUCOSE, UA: NEGATIVE
KETONES UA: NEGATIVE
Leukocytes, UA: NEGATIVE
NITRITE UA: NEGATIVE
PROTEIN UA: NEGATIVE
RBC, UA: NEGATIVE
SPEC GRAV UA: 1.01 (ref 1.005–1.030)
Urobilinogen, Ur: 0.2 mg/dL (ref 0.2–1.0)
pH, UA: 7 (ref 5.0–7.5)

## 2016-11-15 LAB — MICROSCOPIC EXAMINATION
RBC MICROSCOPIC, UA: NONE SEEN /HPF (ref 0–?)
WBC, UA: NONE SEEN /hpf (ref 0–?)

## 2016-11-16 LAB — BUN+CREAT
BUN / CREAT RATIO: 12 (ref 12–28)
BUN: 10 mg/dL (ref 8–27)
CREATININE: 0.82 mg/dL (ref 0.57–1.00)
GFR calc non Af Amer: 78 mL/min/{1.73_m2} (ref >59)
GFR, EST AFRICAN AMERICAN: 90 mL/min/{1.73_m2} (ref >59)

## 2016-11-19 LAB — CULTURE, URINE COMPREHENSIVE

## 2016-11-21 ENCOUNTER — Ambulatory Visit: Payer: Medicare Other | Attending: Family | Admitting: Family

## 2016-11-21 ENCOUNTER — Encounter: Payer: Self-pay | Admitting: Family

## 2016-11-21 VITALS — BP 127/49 | HR 97 | Resp 20 | Ht 63.0 in | Wt 279.2 lb

## 2016-11-21 DIAGNOSIS — E1122 Type 2 diabetes mellitus with diabetic chronic kidney disease: Secondary | ICD-10-CM | POA: Diagnosis not present

## 2016-11-21 DIAGNOSIS — J410 Simple chronic bronchitis: Secondary | ICD-10-CM

## 2016-11-21 DIAGNOSIS — Z9049 Acquired absence of other specified parts of digestive tract: Secondary | ICD-10-CM | POA: Diagnosis not present

## 2016-11-21 DIAGNOSIS — Z9981 Dependence on supplemental oxygen: Secondary | ICD-10-CM | POA: Insufficient documentation

## 2016-11-21 DIAGNOSIS — Z87891 Personal history of nicotine dependence: Secondary | ICD-10-CM | POA: Diagnosis not present

## 2016-11-21 DIAGNOSIS — E1151 Type 2 diabetes mellitus with diabetic peripheral angiopathy without gangrene: Secondary | ICD-10-CM | POA: Insufficient documentation

## 2016-11-21 DIAGNOSIS — J449 Chronic obstructive pulmonary disease, unspecified: Secondary | ICD-10-CM | POA: Insufficient documentation

## 2016-11-21 DIAGNOSIS — Z79899 Other long term (current) drug therapy: Secondary | ICD-10-CM | POA: Diagnosis not present

## 2016-11-21 DIAGNOSIS — I5032 Chronic diastolic (congestive) heart failure: Secondary | ICD-10-CM | POA: Insufficient documentation

## 2016-11-21 DIAGNOSIS — E039 Hypothyroidism, unspecified: Secondary | ICD-10-CM | POA: Diagnosis not present

## 2016-11-21 DIAGNOSIS — Z794 Long term (current) use of insulin: Secondary | ICD-10-CM

## 2016-11-21 DIAGNOSIS — K219 Gastro-esophageal reflux disease without esophagitis: Secondary | ICD-10-CM | POA: Insufficient documentation

## 2016-11-21 DIAGNOSIS — F329 Major depressive disorder, single episode, unspecified: Secondary | ICD-10-CM | POA: Diagnosis not present

## 2016-11-21 DIAGNOSIS — G4733 Obstructive sleep apnea (adult) (pediatric): Secondary | ICD-10-CM | POA: Diagnosis not present

## 2016-11-21 DIAGNOSIS — I10 Essential (primary) hypertension: Secondary | ICD-10-CM

## 2016-11-21 DIAGNOSIS — N189 Chronic kidney disease, unspecified: Secondary | ICD-10-CM | POA: Insufficient documentation

## 2016-11-21 DIAGNOSIS — E11649 Type 2 diabetes mellitus with hypoglycemia without coma: Secondary | ICD-10-CM

## 2016-11-21 DIAGNOSIS — E559 Vitamin D deficiency, unspecified: Secondary | ICD-10-CM | POA: Insufficient documentation

## 2016-11-21 DIAGNOSIS — I130 Hypertensive heart and chronic kidney disease with heart failure and stage 1 through stage 4 chronic kidney disease, or unspecified chronic kidney disease: Secondary | ICD-10-CM | POA: Insufficient documentation

## 2016-11-21 LAB — GLUCOSE, CAPILLARY: GLUCOSE-CAPILLARY: 174 mg/dL — AB (ref 65–99)

## 2016-11-21 NOTE — Patient Instructions (Addendum)
Resume weighing daily and call for an overnight weight gain of > 2 pounds or a weekly weight gain of >5 pounds. 

## 2016-11-21 NOTE — Progress Notes (Signed)
Patient ID: [name] [name], female    DOB: 07-Mar-1956, 61 y.o.   MRN: [id number]  HPI  Ms Cuyler is a 61 y/o female with a history of vitamin D deficiency, obstructive sleep apnea, GERD, pneumonia, PVD, DM, depression, DJD, COPD with oxygen, allergies, past tobacco use and chronic heart failure.   Last echo was done on 05/21/16 and showed an EF of 50% along with trivial MR/TR/PR. EF has declined slightly from June 2017.  Was in the ED 10/21/16 due to cystitis. Treated and released. Was in the ED 09/24/16 due to abscess that was draining. Already on antibiotics. Treated and released.  Admitted 05/20/16 due to COPD/HF exacerbation. Patient dropped about 3.1L during admission. Given IV diuretics, nebulizers, steroids and antibiotics. Not wearing CPAP and follow-up with pulmonologist was encouraged. Discharged home after 4 days with home health. Admitted 09/08/15 with acute on chronic renal failure due to dehydration and over diuresis. Given gentle IV hydration. Discharged home after 2 days with home health services.  She presents today for her follow-up visit with a chief complaint of pedal edema. She describes this as chronic in nature having been present for several years with varying levels of severity. She has associated fatigue and light-headedness along with this. She denies any shortness of breath, weight gain or leg pain.   Past Medical History:  Diagnosis Date  . Allergic rhinitis   . Allergy   . Back pain   . COPD (chronic obstructive pulmonary disease) ([place])   . Degenerative joint disease of knee, left   . Depression   . Diabetes mellitus without complication ([place])   . Edema   . Elevated lipids   . GERD (gastroesophageal reflux disease)   . Hidradenitis   . History of colonic polyps   . Hypertension   . Hypothyroidism   . IBS (irritable bowel syndrome)   . Insomnia   . Obesity   . Oxygen dependent   . Pneumonia   . PVD (peripheral vascular disease) ([place])   . Sinusitis, chronic   .  Sleep apnea   . Thyroid disease   . Vaginitis, atrophic   . Vertigo   . Vitamin D deficiency    Past Surgical History:  Procedure Laterality Date  . ABDOMINAL HYSTERECTOMY    . AXILLARY HIDRADENITIS EXCISION    . CESAREAN SECTION     x 2  . CHOLECYSTECTOMY    . HEEL SPUR EXCISION N/A   . HYDRADENITIS EXCISION Right 12/31/2015   Procedure: EXCISION HIDRADENITIS AXILLA;  Surgeon: Clayburn Pert, MD;  Location: [facility] ORS;  Service: General;  Laterality: Right;  . KNEE SURGERY Left    1998  . TONSILLECTOMY     Family History  Problem Relation Age of Onset  . Rashes / Skin problems Father   . Hypertension Father   . Heart disease Father   . Breast cancer Paternal Grandmother   . COPD Mother   . Kidney cancer Neg Hx   . Bladder Cancer Neg Hx    Social History  Substance Use Topics  . Smoking status: Former Smoker    Quit date: 12/18/1993  . Smokeless tobacco: Never Used  . Alcohol use No   Allergies  Allergen Reactions  . Codeine Itching   Prior to Admission medications   Medication Sig Start Date End Date Taking? Authorizing Provider  albuterol (PROVENTIL HFA;VENTOLIN HFA) 108 (90 Base) MCG/ACT inhaler Inhale 2 puffs into the lungs every 6 (six) hours as needed for wheezing or shortness  of breath.   Yes [provider]  albuterol (PROVENTIL) (2.5 MG/3ML) 0.083% nebulizer solution Take 2.5 mg by nebulization every 6 (six) hours as needed for wheezing or shortness of breath.    Yes [provider]  atorvastatin (LIPITOR) 20 MG tablet Take 20 mg by mouth at bedtime.    Yes [provider]  benzonatate (TESSALON) 100 MG capsule Take 100 mg by mouth 3 (three) times daily as needed for cough.   Yes [provider]  budesonide-formoterol (SYMBICORT) 160-4.5 MCG/ACT inhaler Inhale 2 puffs into the lungs 2 (two) times daily.   Yes [provider]  Chlorhexidine Gluconate 4 % SOLN Apply 1 application topically 2 (two) times daily. 09/06/16   Yes Clayburn Pert, MD  Cinnamon 500 MG capsule Take 500 mg by mouth daily.   Yes [provider]  clindamycin (CLINDAGEL) 1 % gel Apply 1 application topically 2 (two) times daily.   Yes [provider]  DALIRESP 250 MCG TABS Take 1 tablet by mouth daily.  08/10/16  Yes [provider]  doxepin (SINEQUAN) 50 MG capsule Take 50 mg by mouth at bedtime.   Yes [provider]  DULoxetine (CYMBALTA) 20 MG capsule Take 20 mg by mouth daily.   Yes [provider]  fluconazole (DIFLUCAN) 150 MG tablet Take 150 mg by mouth daily.   Yes [provider]  gabapentin (NEURONTIN) 100 MG capsule Take 200 mg by mouth at bedtime.    Yes [provider]  glucose blood test strip TEST three times a day 12/30/15  Yes [provider]  HUMULIN R U-500 KWIKPEN 500 UNIT/ML injection Inject 120 Units into the skin 2 (two) times daily with a meal.    Yes [provider]  ketoconazole (NIZORAL) 2 % shampoo Apply 1 application topically 2 (two) times a week.   Yes [provider]  Lancets Misc. (ACCU-CHEK FASTCLIX LANCET) KIT  12/27/15  Yes [provider]  levothyroxine (SYNTHROID, LEVOTHROID) 200 MCG tablet Take 200 mcg by mouth daily before breakfast.    Yes [provider]  meclizine (ANTIVERT) 25 MG tablet Take 25 mg by mouth 3 (three) times daily as needed for dizziness.    Yes [provider]  mometasone (NASONEX) 50 MCG/ACT nasal spray Place 2 sprays into both nostrils daily.    Yes [provider]  montelukast (SINGULAIR) 10 MG tablet Take 10 mg by mouth at bedtime.    Yes [provider]  naphazoline-glycerin (CLEAR EYES) 0.012-0.2 % SOLN Place 1-2 drops into both eyes 4 (four) times daily as needed for irritation. 05/23/16  Yes Theodoro Grist, MD  olopatadine (PATANOL) 0.1 % ophthalmic solution Place 1 drop into both eyes 2 (two) times daily.   Yes [provider]   omeprazole (PRILOSEC) 40 MG capsule Take 40 mg by mouth daily.   Yes [provider]  potassium citrate (UROCIT-K) 10 MEQ (1080 MG) SR tablet TAKE 2 TABLETS BY MOUTH 2 TIMES A ADAY. NEED TO TAKE ADDITIONAL 2 TABLETS ONCE A DAY WHILE TAKING THE METOLAZONE 10/28/16  Yes Darylene Price A, FNP  torsemide (DEMADEX) 20 MG tablet take 2 tablets by mouth twice a day 10/01/16  Yes Hackney, Tina A, FNP  TRADJENTA 5 MG TABS tablet Take 5 mg by mouth daily.    Yes [provider]  traMADol (ULTRAM) 50 MG tablet Take 1 tablet (50 mg total) by mouth every 6 (six) hours as needed. 10/22/16 10/22/17 Yes Little, Laroy Apple,  PA-C  traZODone (DESYREL) 50 MG tablet Take 50-100 mg by mouth at bedtime as needed for sleep.    Yes [provider]    Review of Systems  Constitutional: Positive for fatigue. Negative for appetite change.  HENT: Positive for rhinorrhea. Negative for congestion and sore throat.   Eyes: Negative.   Respiratory: Positive for wheezing. Negative for cough, chest tightness and shortness of breath.   Cardiovascular: Positive for leg swelling. Negative for chest pain and palpitations.  Gastrointestinal: Negative for abdominal distention and abdominal pain.  Endocrine: Negative.   Genitourinary: Negative.   Musculoskeletal: Negative for back pain and neck pain.  Skin: Negative.   Allergic/Immunologic: Negative.   Neurological: Positive for light-headedness. Negative for dizziness.  Hematological: Negative for adenopathy. Does not bruise/bleed easily.  Psychiatric/Behavioral: Positive for sleep disturbance (sleeping during the day/awake at night). Negative for dysphoric mood. The patient is not nervous/anxious.    Vitals:   11/21/16 1339  BP: (!) 127/49  Pulse: 97  Resp: 20  SpO2: 92%  Weight: 279 lb 4 oz (126.7 kg)  Height: 5' 3"  (1.6 m)   Wt Readings from Last 3 Encounters:  11/21/16 279 lb 4 oz (126.7 kg)  11/15/16 278 lb 3.2 oz (126.2 kg)  10/21/16 284 lb (128.8  kg)    Lab Results  Component Value Date   CREATININE 0.82 11/15/2016   CREATININE 0.89 10/21/2016   CREATININE 0.87 08/25/2016    Physical Exam  Constitutional: She is oriented to person, place, and time. She appears well-developed and well-nourished.  HENT:  Head: Normocephalic and atraumatic.  Neck: Normal range of motion. Neck supple. No JVD present.  Cardiovascular: Normal rate and regular rhythm.   Pulmonary/Chest: Effort normal. She has wheezes in the right lower field and the left lower field. She has no rales.  Abdominal: Soft. She exhibits no distension. There is no tenderness.  Musculoskeletal: She exhibits edema (1+ pitting edema in bilateral lower legs). She exhibits no tenderness.  Neurological: She is alert and oriented to person, place, and time.  Skin: Skin is warm and dry.  Psychiatric: She has a normal mood and affect. Her behavior is normal. Thought content normal.  Nursing note and vitals reviewed.    Assessment & Plan:  1: Chronic heart failure with preserved ejection fraction- - NYHA class III - minimal fluid overload today with 1+ pitting edema - weight down 6 pounds since last visit  - she admits that she's not weighing herself daily and says that she weighs herself "when I feel like it". Encouraged her to resume weighing daily so that she can call for an overnight weight gain of 2 lbs or a weekly weight gain of 5 lbs - not adding salt and is using a salt substitute. Using meals on wheels for food; reminded her to follow a 2000 mg sodium diet - drinking about 48 ounces of fluid daily - PRN follow-up with cardiologist [name]); last seen 12/28/15 - encouraged her to increase her activity - encouraged her to elevate her legs when at home - does have compression socks and she was encouraged to begin wearing them; could consider lymphapress boots - PharmD reviewed medications with the patient  2: HTN- - BP looks good today - saw PCP July 2018  3:  Diabetes- - glucose in the office was 174. She says that she ate some crackers and took her 120 units of Humulin R U500 before coming to clinic today - admits that she's not checking her glucose  consistently and rarely checks it prior to taking her insulin - counseled on the importance of eating with U500 insulin as well as checking her glucose consistently - has endocrinology appointment scheduled for today after this appointment - A1C on 05/21/16 was 7.0%   4: COPD- - continue inhalers and Daliresp - wears 2L of oxygen when at home - saw pulmonologist [name]) 09/26/16  Patient did not bring her medications nor a list. Each medication was verbally reviewed with the patient and she was encouraged to bring the bottles to every visit to confirm accuracy of list.  Return here in 3 months or sooner for any questions/problems before then.

## 2016-11-30 ENCOUNTER — Ambulatory Visit: Payer: Medicare Other | Admitting: General Surgery

## 2016-12-01 ENCOUNTER — Ambulatory Visit: Admission: RE | Admit: 2016-12-01 | Payer: Medicare Other | Source: Ambulatory Visit

## 2016-12-04 ENCOUNTER — Ambulatory Visit
Admission: RE | Admit: 2016-12-04 | Discharge: 2016-12-04 | Disposition: A | Discharge status: Home / Self Care | Payer: Medicare Other | Source: Ambulatory Visit | Attending: Urology | Admitting: Urology

## 2016-12-04 DIAGNOSIS — I700 Atherosclerosis of aorta: Secondary | ICD-10-CM | POA: Diagnosis not present

## 2016-12-04 DIAGNOSIS — K760 Fatty (change of) liver, not elsewhere classified: Secondary | ICD-10-CM | POA: Insufficient documentation

## 2016-12-04 DIAGNOSIS — R310 Gross hematuria: Secondary | ICD-10-CM | POA: Diagnosis present

## 2016-12-04 DIAGNOSIS — K5730 Diverticulosis of large intestine without perforation or abscess without bleeding: Secondary | ICD-10-CM | POA: Diagnosis not present

## 2016-12-04 HISTORY — DX: Unspecified asthma, uncomplicated: J45.909

## 2016-12-04 MED ORDER — IOPAMIDOL (ISOVUE-300) INJECTION 61%
125.0000 mL | Freq: Once | INTRAVENOUS | Status: AC | PRN
  Administered 2016-12-04: 125 mL via INTRAVENOUS

## 2016-12-05 ENCOUNTER — Encounter: Payer: Self-pay | Admitting: Urology

## 2016-12-05 ENCOUNTER — Ambulatory Visit (INDEPENDENT_AMBULATORY_CARE_PROVIDER_SITE_OTHER): Payer: Medicare Other | Admitting: Urology

## 2016-12-05 VITALS — BP 132/61 | HR 108 | Ht 60.0 in | Wt 282.0 lb

## 2016-12-05 DIAGNOSIS — R310 Gross hematuria: Secondary | ICD-10-CM

## 2016-12-05 MED ORDER — CIPROFLOXACIN HCL 500 MG PO TABS
500.0000 mg | ORAL_TABLET | Freq: Once | ORAL | Status: AC
  Administered 2016-12-05: 500 mg via ORAL

## 2016-12-05 MED ORDER — LIDOCAINE HCL 2 % EX GEL
1.0000 "application " | Freq: Once | CUTANEOUS | Status: AC
  Administered 2016-12-05: 1 via URETHRAL

## 2016-12-05 NOTE — Progress Notes (Signed)
   12/05/16  CC:  Chief Complaint  Patient presents with  . Cysto    HPI: 61 year old former smoker who presents today for cystoscopy for completion of her gross hematuria workup.  She relates this episode of gross hematuria related to a fall that happened one week prior to this episode of painless gross hematuria.  She underwent CT urogram yesterday which was reviewed personally today. No GU pathology was identified.  Blood pressure 132/61, pulse (!) 108, height 5' (1.524 m), weight 282 lb (127.9 kg). NED. A&Ox3.   No respiratory distress   Abd soft, NT, ND Normal external genitalia with patent urethral meatus  Cystoscopy Procedure Note  Patient identification was confirmed, informed consent was obtained, and patient was prepped using Betadine solution.  Lidocaine jelly was administered per urethral meatus.    Preoperative abx where received prior to procedure.    Procedure: - Flexible cystoscope introduced, without any difficulty.   - Thorough search of the bladder revealed:    normal urethral meatus    normal urothelium    no stones    no ulcers     no tumors    no urethral polyps    no trabeculation  - Ureteral orifices were normal in position and appearance.  Post-Procedure: - Patient tolerated the procedure well  Assessment/ Plan:  1. Gross hematuria Etiology unclear Cystoscopy and CT urogram negative Results reviewed with patient F/u prn - Urinalysis, Complete - ciprofloxacin (CIPRO) tablet 500 mg; Take 1 tablet (500 mg total) by mouth once. - lidocaine (XYLOCAINE) 2 % jelly 1 application; Place 1 application into the urethra once.   Hollice Espy, MD

## 2016-12-06 LAB — MICROSCOPIC EXAMINATION
Bacteria, UA: NONE SEEN
RBC MICROSCOPIC, UA: NONE SEEN /HPF (ref 0–?)
WBC UA: NONE SEEN /HPF (ref 0–?)

## 2016-12-06 LAB — URINALYSIS, COMPLETE
Bilirubin, UA: NEGATIVE
GLUCOSE, UA: NEGATIVE
KETONES UA: NEGATIVE
Leukocytes, UA: NEGATIVE
NITRITE UA: NEGATIVE
RBC, UA: NEGATIVE
Specific Gravity, UA: 1.015 (ref 1.005–1.030)
UUROB: 0.2 mg/dL (ref 0.2–1.0)
pH, UA: 7.5 (ref 5.0–7.5)

## 2016-12-12 ENCOUNTER — Telehealth: Payer: Self-pay

## 2016-12-12 NOTE — Telephone Encounter (Signed)
I spoke with [name] [name] at [facility], she stated that patients shoes are still on backorder and probably will not come in.  She is going to ship the inserts that have already been ordered.   I spoke with patient and informed her of this, she is going to come to [place] office and pick out another pair of shoes, then I will order shoes only with safest[employer].

## 2016-12-19 ENCOUNTER — Other Ambulatory Visit: Payer: Self-pay | Admitting: Family

## 2016-12-21 ENCOUNTER — Emergency Department
Admission: EM | Admit: 2016-12-21 | Discharge: 2016-12-21 | Disposition: A | Discharge status: Home / Self Care | Payer: Medicare Other | Attending: Emergency Medicine | Admitting: Emergency Medicine

## 2016-12-21 ENCOUNTER — Encounter: Payer: Self-pay | Admitting: Emergency Medicine

## 2016-12-21 DIAGNOSIS — J449 Chronic obstructive pulmonary disease, unspecified: Secondary | ICD-10-CM | POA: Diagnosis not present

## 2016-12-21 DIAGNOSIS — L02211 Cutaneous abscess of abdominal wall: Secondary | ICD-10-CM

## 2016-12-21 DIAGNOSIS — Z87891 Personal history of nicotine dependence: Secondary | ICD-10-CM | POA: Diagnosis not present

## 2016-12-21 DIAGNOSIS — E119 Type 2 diabetes mellitus without complications: Secondary | ICD-10-CM | POA: Insufficient documentation

## 2016-12-21 DIAGNOSIS — I5032 Chronic diastolic (congestive) heart failure: Secondary | ICD-10-CM | POA: Diagnosis not present

## 2016-12-21 DIAGNOSIS — E039 Hypothyroidism, unspecified: Secondary | ICD-10-CM | POA: Insufficient documentation

## 2016-12-21 DIAGNOSIS — Z79899 Other long term (current) drug therapy: Secondary | ICD-10-CM | POA: Insufficient documentation

## 2016-12-21 DIAGNOSIS — J45909 Unspecified asthma, uncomplicated: Secondary | ICD-10-CM | POA: Diagnosis not present

## 2016-12-21 DIAGNOSIS — Z794 Long term (current) use of insulin: Secondary | ICD-10-CM | POA: Insufficient documentation

## 2016-12-21 DIAGNOSIS — R109 Unspecified abdominal pain: Secondary | ICD-10-CM | POA: Diagnosis present

## 2016-12-21 DIAGNOSIS — I110 Hypertensive heart disease with heart failure: Secondary | ICD-10-CM | POA: Diagnosis not present

## 2016-12-21 LAB — URINALYSIS, COMPLETE (UACMP) WITH MICROSCOPIC
BILIRUBIN URINE: NEGATIVE
GLUCOSE, UA: NEGATIVE mg/dL
HGB URINE DIPSTICK: NEGATIVE
KETONES UR: NEGATIVE mg/dL
LEUKOCYTES UA: NEGATIVE
NITRITE: NEGATIVE
PH: 5 (ref 5.0–8.0)
Protein, ur: NEGATIVE mg/dL
RBC / HPF: NONE SEEN RBC/hpf (ref 0–5)
SPECIFIC GRAVITY, URINE: 1.009 (ref 1.005–1.030)

## 2016-12-21 LAB — COMPREHENSIVE METABOLIC PANEL
ALT: 11 U/L — AB (ref 14–54)
AST: 18 U/L (ref 15–41)
Albumin: 3.6 g/dL (ref 3.5–5.0)
Alkaline Phosphatase: 123 U/L (ref 38–126)
Anion gap: 11 (ref 5–15)
BILIRUBIN TOTAL: 0.5 mg/dL (ref 0.3–1.2)
BUN: 13 mg/dL (ref 6–20)
CHLORIDE: 100 mmol/L — AB (ref 101–111)
CO2: 29 mmol/L (ref 22–32)
CREATININE: 0.89 mg/dL (ref 0.44–1.00)
Calcium: 9.4 mg/dL (ref 8.9–10.3)
Glucose, Bld: 90 mg/dL (ref 65–99)
POTASSIUM: 3.7 mmol/L (ref 3.5–5.1)
Sodium: 140 mmol/L (ref 135–145)
TOTAL PROTEIN: 7.9 g/dL (ref 6.5–8.1)

## 2016-12-21 LAB — CBC
HCT: 36 % (ref 35.0–47.0)
Hemoglobin: 11.4 g/dL — ABNORMAL LOW (ref 12.0–16.0)
MCH: 22.4 pg — ABNORMAL LOW (ref 26.0–34.0)
MCHC: 31.8 g/dL — ABNORMAL LOW (ref 32.0–36.0)
MCV: 70.5 fL — AB (ref 80.0–100.0)
PLATELETS: 417 10*3/uL (ref 150–440)
RBC: 5.1 MIL/uL (ref 3.80–5.20)
RDW: 17.5 % — AB (ref 11.5–14.5)
WBC: 12.8 10*3/uL — AB (ref 3.6–11.0)

## 2016-12-21 LAB — LIPASE, BLOOD: LIPASE: 26 U/L (ref 11–51)

## 2016-12-21 MED ORDER — ACETAMINOPHEN 500 MG PO TABS: 1000.0000 mg | ORAL_TABLET | Freq: Three times a day (TID) | ORAL | 0 refills | Status: AC | PRN

## 2016-12-21 MED ORDER — KETOROLAC TROMETHAMINE 60 MG/2ML IM SOLN
INTRAMUSCULAR | Status: AC
  Filled 2016-12-21: qty 2

## 2016-12-21 MED ORDER — IBUPROFEN 800 MG PO TABS: 800.0000 mg | ORAL_TABLET | Freq: Three times a day (TID) | ORAL | 0 refills | Status: DC | PRN

## 2016-12-21 MED ORDER — KETOROLAC TROMETHAMINE 60 MG/2ML IM SOLN
60.0000 mg | Freq: Once | INTRAMUSCULAR | Status: AC
  Administered 2016-12-21: 60 mg via INTRAMUSCULAR

## 2016-12-21 NOTE — ED Notes (Signed)
Patient reports clear, bloody drainage from abscess.

## 2016-12-21 NOTE — Discharge Instructions (Signed)
Please continue to monitor your abscess. It may continue to drain a small amount of clear or yellowish clear fluid or blood but if you start to notice redness around the abscess, drainage of pus, increased pain, fevers, chills, nausea or vomiting, seek immediate medical attention.  You may alternate Tylenol and Motrin for your pain.  You may also apply an ice pack over the bandage of your wound for 15 minutes every two hours to decrease pain.

## 2016-12-21 NOTE — ED Triage Notes (Signed)
Pt reports she had an abscess lanced on her left lower abdomin x2 months ago and in the last week has had pain and drainage at the site. Pt has approximately a one inch hole on the lower left side of stomach with clear drainage. Area is not red or swollen on assessment.

## 2016-12-21 NOTE — ED Notes (Signed)
MD Norman at bedside 

## 2016-12-21 NOTE — ED Provider Notes (Signed)
[facility] Emergency Department Provider Note  ____________________________________________  Time seen: Approximately 9:05 PM  I have reviewed the triage vital signs and the nursing notes.   HISTORY  Chief Complaint Abdominal Pain    HPI [name] [name] is a 61 y.o. female with a history of I&D of an abdominal wall abscess several weeks ago presenting with associated pain. The patient reports that her wound has been getting smaller but it continues to drain a minimal amount of blood and clear fluid. She reports that she has ongoing pain at this area, which does not improve when she takes 200 mg of Motrin. She denies any systemic symptoms including fevers, chills, nausea or vomiting.   Past Medical History:  Diagnosis Date  . Allergic rhinitis   . Allergy   . Asthma   . Back pain   . CHF (congestive heart failure) ([place])   . COPD (chronic obstructive pulmonary disease) ([place])   . Degenerative joint disease of knee, left   . Depression   . Diabetes mellitus without complication ([place])   . Edema   . Elevated lipids   . GERD (gastroesophageal reflux disease)   . Hidradenitis   . History of colonic polyps   . Hypertension   . Hypothyroidism   . IBS (irritable bowel syndrome)   . Insomnia   . Obesity   . Oxygen dependent   . Pneumonia   . PVD (peripheral vascular disease) ([place])   . Sinusitis, chronic   . Sleep apnea   . Thyroid disease   . Vaginitis, atrophic   . Vertigo   . Vitamin D deficiency     Patient Active Problem List   Diagnosis Date Noted  . COPD (chronic obstructive pulmonary disease) ([place]) 08/25/2016  . Acute on chronic heart failure with normal ejection fraction ([place]) 08/20/2016  . Chronic diastolic heart failure ([place]) 06/02/2016  . Bilateral lower leg cellulitis 05/23/2016  . Leukocytosis 05/23/2016  . Iron deficiency anemia 05/23/2016  . Thrombocytosis ([place]) 05/23/2016  . COPD with acute exacerbation ([place]) 05/21/2016  .  Pulmonary hypertension ([place]) 12/14/2015  . Atypical chest pain 10/29/2015  . Pneumonia 09/08/2015  . Breast abscess 06/02/2015  . Abnormal mammogram of both breasts 02/25/2015  . Hidradenitis 12/30/2014  . Diabetes ([place]) 12/30/2014  . Essential hypertension 12/30/2014  . Asthma 12/30/2014    Past Surgical History:  Procedure Laterality Date  . ABDOMINAL HYSTERECTOMY    . AXILLARY HIDRADENITIS EXCISION    . CESAREAN SECTION     x 2  . CHOLECYSTECTOMY    . HEEL SPUR EXCISION N/A   . HYDRADENITIS EXCISION Right 12/31/2015   Procedure: EXCISION HIDRADENITIS AXILLA;  Surgeon: Clayburn Pert, MD;  Location: [facility] ORS;  Service: General;  Laterality: Right;  . KNEE SURGERY Left    1998  . TONSILLECTOMY      Current Outpatient Rx  . Order #: [id number] Class: Print  . Order #: [id number] Class: Historical Med  . Order #: [id number] Class: Historical Med  . Order #: [id number] Class: Historical Med  . Order #: [id number] Class: Historical Med  . Order #: [id number] Class: Historical Med  . Order #: [id number] Class: Normal  . Order #: [id number] Class: Historical Med  . Order #: [id number] Class: Historical Med  . Order #: [id number] Class: Historical Med  . Order #: [id number] Class: Historical Med  . Order #: [id number] Class: Historical Med  . Order #: [id number] Class: Historical Med  . Order #: [id number] Class: Historical Med  . Order #: [id number] Class: Historical Med  . Order #:  [id number] Class: Historical Med  . Order #: [id number] Class: Print  . Order #: [id number] Class: Historical Med  . Order #: [id number] Class: Historical Med  . Order #: [id number] Class: Historical Med  . Order #: [id number] Class: Historical Med  . Order #: [id number] Class: Historical Med  . Order #: [id number] Class: Historical Med  . Order #: [id number] Class: Normal  . Order #: [id number] Class: Historical Med  . Order #: [id number] Class: Historical Med  . Order #: [id number] Class: Normal  . Order #: [id number] Class: Normal  . Order  #: [id number] Class: Historical Med  . Order #: [id number] Class: Print  . Order #: [id number] Class: Historical Med    Allergies Codeine  Family History  Problem Relation Age of Onset  . Rashes / Skin problems Father   . Hypertension Father   . Heart disease Father   . Breast cancer Paternal Grandmother   . COPD Mother   . Kidney cancer Neg Hx   . Bladder Cancer Neg Hx     Social History Social History  Substance Use Topics  . Smoking status: Former Smoker    Quit date: 12/18/1993  . Smokeless tobacco: Never Used  . Alcohol use No    Review of Systems Constitutional: No fever/chills.no general malaise. ENT: . No congestion or rhinorrhea. Cardiovascular: Denies chest pain. Denies palpitations. Respiratory: Denies shortness of breath.  No cough. Gastrointestinal: positiveabdominal wallpain.  No nausea, no vomiting.  No diarrhea.  No constipation.positive morbid obesity. Musculoskeletal: Negative for back pain. Skin: positive for healing abdominal wall abscess. Neurological: Negative for headaches. No focal numbness, tingling or weakness.     ____________________________________________   PHYSICAL EXAM:  VITAL SIGNS: ED Triage Vitals  Enc Vitals Group     BP 12/21/16 1903 136/72     Pulse Rate 12/21/16 1903 88     Resp 12/21/16 1903 17     Temp 12/21/16 1903 98.4 F (36.9 C)     Temp src --      SpO2 12/21/16 1903 99 %     Weight 12/21/16 1904 282 lb (127.9 kg)     Height --      Head Circumference --      Peak Flow --      Pain Score 12/21/16 2050 10     Pain Loc --      Pain Edu? --      Excl. in [place]? --     Constitutional: Alert and oriented. Chronically ill appearing but in no acute distress. Answers questions appropriately. Eyes: Conjunctivae are normal.  EOMI. No scleral icterus. Head: Atraumatic. Nose: No congestion/rhinnorhea. Mouth/Throat: Mucous membranes are moist.  Neck: No stridor.  Supple.  No meningismus Cardiovascular: Normal rate,   Respiratory: Normal respiratory effort.   Gastrointestinal: Morbidly obese. Soft, and nondistended.  No guarding or rebound.  No peritoneal signs. See skin examination for abscess description. Musculoskeletal: No LE edema. No ttp in the calves or palpable cords.  Negative Homan's sign. Neurologic:  A&Ox3.  Speech is clear.  Face and smile are symmetric.  EOMI.  Moves all extremities well. Skin:  <1cm open wound at the pannus and mons crease on the left w/ minimal clear discharge, no surrounding erythema, no fluctuance, no ttp. Psychiatric: Mood and affect are normal. Speech and behavior are normal.  Normal judgement.  ____________________________________________   LABS (all labs ordered are listed, but only abnormal results are displayed)  Labs Reviewed  COMPREHENSIVE METABOLIC PANEL - Abnormal; Notable for the following:       Result Value   Chloride 100 (*)  ALT 11 (*)    All other components within normal limits  CBC - Abnormal; Notable for the following:    WBC 12.8 (*)    Hemoglobin 11.4 (*)    MCV 70.5 (*)    MCH 22.4 (*)    MCHC 31.8 (*)    RDW 17.5 (*)    All other components within normal limits  URINALYSIS, COMPLETE (UACMP) WITH MICROSCOPIC - Abnormal; Notable for the following:    Color, Urine STRAW (*)    APPearance CLEAR (*)    Bacteria, UA RARE (*)    Squamous Epithelial / LPF 0-5 (*)    All other components within normal limits  LIPASE, BLOOD   ____________________________________________  EKG  Not indicated ____________________________________________  RADIOLOGY  No results found.  ____________________________________________   PROCEDURES  Procedure(s) performed: None  Procedures  Critical Care performed: No ____________________________________________   INITIAL IMPRESSION / ASSESSMENT AND PLAN / ED COURSE  Pertinent labs & imaging results that were available during my care of the patient were reviewed by me and considered in my medical  decision making (see chart for details).  61 y.o. female with a healing abscess on the abdominal wall that is below large panus above the mons with minimal clear discharge. Overall, the abscess is well healing and there is no evidence of surrounding skin infection, nor any exam findings that would be consistent with underlying abscess or internal tracking. I can see the base of the abscess completely and cannotextrude any pus on my examination. The patient has not been having any systemic symptoms. She has been grossly under treating her pain with 200 mg of Motrin, and I have given her prescriptions with the correct dosages of Motrin and Tylenol for discharge. The patient will continue her dressing changes and to monitor her abscess, and follow up with her primary care physician. Return precautions were discussed  ____________________________________________  FINAL CLINICAL IMPRESSION(S) / ED DIAGNOSES  Final diagnoses:  Abscess of abdominal wall         NEW MEDICATIONS STARTED DURING THIS VISIT:  New Prescriptions   ACETAMINOPHEN (TYLENOL) 500 MG TABLET    Take 2 tablets (1,000 mg total) by mouth every 8 (eight) hours as needed for mild pain or moderate pain.   IBUPROFEN (ADVIL,MOTRIN) 800 MG TABLET    Take 1 tablet (800 mg total) by mouth every 8 (eight) hours as needed for mild pain or moderate pain (with food).      Eula Listen, MD 12/21/16 2114

## 2016-12-21 NOTE — ED Notes (Signed)
Patient reports hx of abscess to left lower quadrant. Pt reports abscess was lanced approx 2 months ago. Pt reports that abscess has begun to drain approx 1 week ago, and pain began 1 week ago.

## 2016-12-21 NOTE — ED Notes (Addendum)
Brought patient belongings bag and blanket.

## 2016-12-21 NOTE — ED Notes (Addendum)
Reviewed d/c instructions, follow-up care, prescriptions with patient. Patient verbalized understanding.  

## 2016-12-25 ENCOUNTER — Ambulatory Visit: Payer: Medicare Other | Admitting: Surgery

## 2017-01-18 ENCOUNTER — Other Ambulatory Visit: Payer: Self-pay | Admitting: Family Medicine

## 2017-01-18 DIAGNOSIS — R928 Other abnormal and inconclusive findings on diagnostic imaging of breast: Secondary | ICD-10-CM

## 2017-02-21 ENCOUNTER — Other Ambulatory Visit: Payer: Medicare Other

## 2017-02-27 ENCOUNTER — Other Ambulatory Visit: Payer: Self-pay

## 2017-02-27 ENCOUNTER — Ambulatory Visit: Payer: Medicare Other | Admitting: Family

## 2017-03-01 ENCOUNTER — Ambulatory Visit: Payer: Self-pay | Admitting: General Surgery

## 2017-03-02 ENCOUNTER — Other Ambulatory Visit: Payer: Medicare Other

## 2017-03-08 ENCOUNTER — Ambulatory Visit (INDEPENDENT_AMBULATORY_CARE_PROVIDER_SITE_OTHER): Payer: Medicare Other | Admitting: Podiatry

## 2017-03-08 ENCOUNTER — Encounter: Payer: Self-pay | Admitting: Podiatry

## 2017-03-08 DIAGNOSIS — E0842 Diabetes mellitus due to underlying condition with diabetic polyneuropathy: Secondary | ICD-10-CM

## 2017-03-08 NOTE — Progress Notes (Signed)
Patient presented today for diabetic shoe pick up.  Patient didn't like the shoe ordered and were too small.  She request Tennis shoes.  Shoes will be returned and new order placed for  Apex [id number] We will contact patient when new shoes come in   Gardiner Barefoot DPM

## 2017-03-08 NOTE — Patient Instructions (Signed)

## 2017-03-09 ENCOUNTER — Ambulatory Visit: Payer: Medicare Other | Attending: Family | Admitting: Family

## 2017-03-09 ENCOUNTER — Other Ambulatory Visit: Payer: Self-pay

## 2017-03-09 ENCOUNTER — Encounter: Payer: Self-pay | Admitting: Family

## 2017-03-09 VITALS — BP 148/70 | HR 99 | Temp 98.3°F | Resp 20 | Ht 61.0 in | Wt 284.0 lb

## 2017-03-09 DIAGNOSIS — Z9889 Other specified postprocedural states: Secondary | ICD-10-CM | POA: Diagnosis not present

## 2017-03-09 DIAGNOSIS — J449 Chronic obstructive pulmonary disease, unspecified: Secondary | ICD-10-CM | POA: Diagnosis not present

## 2017-03-09 DIAGNOSIS — I5032 Chronic diastolic (congestive) heart failure: Secondary | ICD-10-CM | POA: Insufficient documentation

## 2017-03-09 DIAGNOSIS — Z885 Allergy status to narcotic agent status: Secondary | ICD-10-CM | POA: Insufficient documentation

## 2017-03-09 DIAGNOSIS — Z791 Long term (current) use of non-steroidal anti-inflammatories (NSAID): Secondary | ICD-10-CM | POA: Insufficient documentation

## 2017-03-09 DIAGNOSIS — K219 Gastro-esophageal reflux disease without esophagitis: Secondary | ICD-10-CM | POA: Insufficient documentation

## 2017-03-09 DIAGNOSIS — Z87891 Personal history of nicotine dependence: Secondary | ICD-10-CM | POA: Diagnosis not present

## 2017-03-09 DIAGNOSIS — I110 Hypertensive heart disease with heart failure: Secondary | ICD-10-CM | POA: Diagnosis not present

## 2017-03-09 DIAGNOSIS — Z9049 Acquired absence of other specified parts of digestive tract: Secondary | ICD-10-CM | POA: Insufficient documentation

## 2017-03-09 DIAGNOSIS — Z794 Long term (current) use of insulin: Secondary | ICD-10-CM | POA: Insufficient documentation

## 2017-03-09 DIAGNOSIS — Z8249 Family history of ischemic heart disease and other diseases of the circulatory system: Secondary | ICD-10-CM | POA: Insufficient documentation

## 2017-03-09 DIAGNOSIS — Z90710 Acquired absence of both cervix and uterus: Secondary | ICD-10-CM | POA: Diagnosis not present

## 2017-03-09 DIAGNOSIS — N644 Mastodynia: Secondary | ICD-10-CM | POA: Diagnosis not present

## 2017-03-09 DIAGNOSIS — Z79891 Long term (current) use of opiate analgesic: Secondary | ICD-10-CM | POA: Diagnosis not present

## 2017-03-09 DIAGNOSIS — E559 Vitamin D deficiency, unspecified: Secondary | ICD-10-CM | POA: Insufficient documentation

## 2017-03-09 DIAGNOSIS — R42 Dizziness and giddiness: Secondary | ICD-10-CM | POA: Insufficient documentation

## 2017-03-09 DIAGNOSIS — E1151 Type 2 diabetes mellitus with diabetic peripheral angiopathy without gangrene: Secondary | ICD-10-CM | POA: Diagnosis not present

## 2017-03-09 DIAGNOSIS — G4733 Obstructive sleep apnea (adult) (pediatric): Secondary | ICD-10-CM | POA: Diagnosis not present

## 2017-03-09 DIAGNOSIS — E119 Type 2 diabetes mellitus without complications: Secondary | ICD-10-CM

## 2017-03-09 DIAGNOSIS — Z79899 Other long term (current) drug therapy: Secondary | ICD-10-CM | POA: Insufficient documentation

## 2017-03-09 DIAGNOSIS — Z825 Family history of asthma and other chronic lower respiratory diseases: Secondary | ICD-10-CM | POA: Insufficient documentation

## 2017-03-09 DIAGNOSIS — F329 Major depressive disorder, single episode, unspecified: Secondary | ICD-10-CM | POA: Diagnosis not present

## 2017-03-09 DIAGNOSIS — I10 Essential (primary) hypertension: Secondary | ICD-10-CM

## 2017-03-09 DIAGNOSIS — J410 Simple chronic bronchitis: Secondary | ICD-10-CM

## 2017-03-09 NOTE — Patient Instructions (Signed)
Continue weighing daily and call for an overnight weight gain of > 2 pounds or a weekly weight gain of >5 pounds. 

## 2017-03-09 NOTE — Progress Notes (Signed)
Patient ID: [name] [name], female    DOB: [birth year], 61 y.o.   MRN: [id number]  HPI  [name] [name] is a 61 y/o female with a history of vitamin D deficiency, obstructive sleep apnea, GERD, pneumonia, PVD, DM, depression, DJD, COPD with oxygen, allergies, past tobacco use and chronic heart failure.   Last echo was done on 05/21/16 and showed an EF of 50% along with trivial MR/TR/PR. EF has declined slightly from June 2017.  Was in the ED 12/21/16 due to pain in her abdomen due to I&D of abdominal abscess. Treated and released. Was in the ED 10/21/16 due to cystitis. Treated and released. Was in the ED 09/24/16 due to abscess that was draining. Already on antibiotics. Treated and released.    She presents today for her follow-up visit with a chief complaint of moderate fatigue upon minimal exertion. She describes this as chronic in nature having been present for several years with varying levels of severity. She has associated wheezing, light-headedness, difficulty sleeping, chronic edema and left breast pain due to cyst. She denies any shortness of breath, palpitations, cough or abdominal distention.   Past Medical History:  Diagnosis Date  . Allergic rhinitis   . Allergy   . Asthma   . Back pain   . CHF (congestive heart failure) ([place])   . COPD (chronic obstructive pulmonary disease) ([place])   . Degenerative joint disease of knee, left   . Depression   . Diabetes mellitus without complication ([place])   . Edema   . Elevated lipids   . GERD (gastroesophageal reflux disease)   . Hidradenitis   . History of colonic polyps   . Hypertension   . Hypothyroidism   . IBS (irritable bowel syndrome)   . Insomnia   . Obesity   . Oxygen dependent   . Pneumonia   . PVD (peripheral vascular disease) ([place])   . Sinusitis, chronic   . Sleep apnea   . Thyroid disease   . Vaginitis, atrophic   . Vertigo   . Vitamin D deficiency    Past Surgical History:  Procedure Laterality Date  . ABDOMINAL HYSTERECTOMY     . AXILLARY HIDRADENITIS EXCISION    . CESAREAN SECTION     x 2  . CHOLECYSTECTOMY    . HEEL SPUR EXCISION N/A   . HYDRADENITIS EXCISION Right 12/31/2015   Procedure: EXCISION HIDRADENITIS AXILLA;  Surgeon: Clayburn Pert, MD;  Location: [facility] ORS;  Service: General;  Laterality: Right;  . KNEE SURGERY Left    1998  . TONSILLECTOMY     Family History  Problem Relation Age of Onset  . Rashes / Skin problems Father   . Hypertension Father   . Heart disease Father   . Breast cancer Paternal Grandmother   . COPD Mother   . Kidney cancer Neg Hx   . Bladder Cancer Neg Hx    Social History   Tobacco Use  . Smoking status: Former Smoker    Last attempt to quit: 12/18/1993    Years since quitting: 23.2  . Smokeless tobacco: Never Used  Substance Use Topics  . Alcohol use: No   Allergies  Allergen Reactions  . Codeine Itching   Prior to Admission medications   Medication Sig Start Date End Date Taking? Authorizing Provider  acetaminophen (TYLENOL) 500 MG tablet Take 2 tablets (1,000 mg total) by mouth every 8 (eight) hours as needed for mild pain or moderate pain. 12/21/16 12/21/17 Yes Eula Listen, MD  albuterol (PROVENTIL HFA;VENTOLIN HFA) 108 (90 Base) MCG/ACT inhaler Inhale 2 puffs into the lungs every 6 (six) hours as needed for wheezing or shortness of breath.   Yes [provider]  albuterol (PROVENTIL) (2.5 MG/3ML) 0.083% nebulizer solution Take 2.5 mg by nebulization every 6 (six) hours as needed for wheezing or shortness of breath.    Yes [provider]  atorvastatin (LIPITOR) 20 MG tablet Take 20 mg by mouth at bedtime.    Yes [provider]  benzonatate (TESSALON) 100 MG capsule Take 100 mg by mouth 3 (three) times daily as needed for cough.   Yes [provider]  budesonide-formoterol (SYMBICORT) 160-4.5 MCG/ACT inhaler Inhale 2 puffs into the lungs 2 (two) times daily.   Yes [provider]  Chlorhexidine Gluconate  4 % SOLN Apply 1 application topically 2 (two) times daily. 09/06/16  Yes Clayburn Pert, MD  clindamycin (CLINDAGEL) 1 % gel Apply 1 application topically 2 (two) times daily.   Yes [provider]  doxepin (SINEQUAN) 50 MG capsule Take 50 mg by mouth at bedtime.   Yes [provider]  DULoxetine (CYMBALTA) 20 MG capsule Take 20 mg by mouth daily.   Yes [provider]  fluconazole (DIFLUCAN) 150 MG tablet Take 150 mg by mouth daily.   Yes [provider]  gabapentin (NEURONTIN) 100 MG capsule Take 300 mg by mouth at bedtime.    Yes [provider]  glucose blood test strip TEST three times a day 12/30/15  Yes [provider]  HUMULIN R U-500 KWIKPEN 500 UNIT/ML injection Inject 120 Units into the skin 2 (two) times daily with a meal.    Yes [provider]  ibuprofen (ADVIL,MOTRIN) 800 MG tablet Take 1 tablet (800 mg total) by mouth every 8 (eight) hours as needed for mild pain or moderate pain (with food). 12/21/16  Yes Eula Listen, MD  ketoconazole (NIZORAL) 2 % shampoo Apply 1 application topically 2 (two) times a week.   Yes [provider]  Lancets Misc. (ACCU-CHEK FASTCLIX LANCET) KIT  12/27/15  Yes [provider]  levothyroxine (SYNTHROID, LEVOTHROID) 200 MCG tablet Take 200 mcg by mouth daily before breakfast.    Yes [provider]  meclizine (ANTIVERT) 25 MG tablet Take 25 mg by mouth 3 (three) times daily as needed for dizziness.    Yes [provider]  mometasone (NASONEX) 50 MCG/ACT nasal spray Place 2 sprays into both nostrils daily.    Yes [provider]  montelukast (SINGULAIR) 10 MG tablet Take 10 mg by mouth at bedtime.    Yes [provider]  naphazoline-glycerin (CLEAR EYES) 0.012-0.2 % SOLN Place 1-2 drops into both eyes 4 (four) times daily as needed for irritation. 05/23/16  Yes Theodoro Grist, MD  olopatadine (PATANOL) 0.1 % ophthalmic solution Place  1 drop into both eyes 2 (two) times daily.   Yes [provider]  omeprazole (PRILOSEC) 40 MG capsule Take 40 mg by mouth daily.   Yes [provider]  potassium citrate (UROCIT-K) 10 MEQ (1080 MG) SR tablet Take 2 tablets (20 mEq total) by mouth 2 (two) times daily. 12/20/16  Yes Sharol Croghan A, FNP  roflumilast (DALIRESP) 500 MCG TABS tablet Take 500 mcg by mouth daily.   Yes [provider]  torsemide (DEMADEX) 20 MG tablet take 2 tablets by mouth twice a day 10/01/16  Yes Leylany Nored A, FNP  TRADJENTA 5 MG TABS tablet Take 5 mg by mouth daily.  Yes [provider]  traMADol (ULTRAM) 50 MG tablet Take 1 tablet (50 mg total) by mouth every 6 (six) hours as needed. 10/22/16 10/22/17 Yes Little, Traci M, PA-C  traZODone (DESYREL) 50 MG tablet Take 50-100 mg by mouth at bedtime as needed for sleep.    Yes [provider]   Review of Systems  Constitutional: Positive for fatigue. Negative for appetite change.  HENT: Positive for rhinorrhea. Negative for congestion and sore throat.   Eyes: Negative.   Respiratory: Positive for wheezing. Negative for cough, chest tightness and shortness of breath.   Cardiovascular: Positive for chest pain (left chest wall aches) and leg swelling. Negative for palpitations.  Gastrointestinal: Negative for abdominal distention and abdominal pain.  Endocrine: Negative.   Genitourinary: Negative.   Musculoskeletal: Negative for back pain and neck pain.  Skin: Negative.   Allergic/Immunologic: Negative.   Neurological: Positive for light-headedness (intermittent). Negative for dizziness.  Hematological: Negative for adenopathy. Does not bruise/bleed easily.  Psychiatric/Behavioral: Positive for sleep disturbance (sleeping during the day/awake at night). Negative for dysphoric mood. The patient is not nervous/anxious.    Vitals:   03/09/17 1252  BP: (!) 148/70  Pulse: 99  Temp: 98.3 F (36.8 C)  TempSrc: Oral  SpO2:  95%  Weight: 284 lb (128.8 kg)  Height: 5' 1"  (1.549 m)   Wt Readings from Last 3 Encounters:  03/09/17 284 lb (128.8 kg)  12/21/16 282 lb (127.9 kg)  12/05/16 282 lb (127.9 kg)    Lab Results  Component Value Date   CREATININE 0.89 12/21/2016   CREATININE 0.82 11/15/2016   CREATININE 0.89 10/21/2016    Physical Exam  Constitutional: She is oriented to person, place, and time. She appears well-developed and well-nourished.  HENT:  Head: Normocephalic and atraumatic.  Neck: Normal range of motion. Neck supple. No JVD present.  Cardiovascular: Normal rate and regular rhythm.  Pulmonary/Chest: Effort normal. She has no wheezes. She has no rales.  Abdominal: Soft. She exhibits no distension. There is no tenderness.  Musculoskeletal: She exhibits edema (1+ pitting edema in bilateral lower legs). She exhibits no tenderness.  Neurological: She is alert and oriented to person, place, and time.  Skin: Skin is warm and dry.  Psychiatric: She has a normal mood and affect. Her behavior is normal. Thought content normal.  Nursing note and vitals reviewed.    Assessment & Plan:  1: Chronic heart failure with preserved ejection fraction- - NYHA class III - minimal fluid overload today with 1+ pitting edema - weighing herself daily. Reminded to  call for an overnight weight gain of 2 lbs or a weekly weight gain of 5 lbs - not adding salt and is using a salt substitute. Using meals on wheels for food; reminded her to follow a 2000 mg sodium diet - drinking about 48 ounces of fluid daily - PRN follow-up with cardiologist [name]); last seen 12/28/15 - encouraged her to increase her activity - encouraged her to elevate her legs when at home - does have compression socks but hasn't been wearing them. Discussed how wearing them could help decrease the edema that she currently has - reports receiving her flu vaccine for the season  2: HTN- - BP looks good today - saw PCP July 2018 - BMP from  12/21/16 reviewed and showed sodium 140, potassium 3.7 and GFR >60  3: Diabetes- - nonfasting glucose in the office was 155.  - admits that she's not checking her glucose consistently and rarely checks it  prior to taking her insulin - counseled on the importance of eating with U500 insulin as well as checking her glucose consistently - A1C on 05/21/16 was 7.0%   4: COPD- - continue inhalers and Daliresp - wears 2L of oxygen around the clock when at home; did not bring to wear today - saw pulmonologist [name]) 09/26/16  Patient did not bring her medications nor a list. Each medication was verbally reviewed with the patient and she was encouraged to bring the bottles to every visit to confirm accuracy of list.  Return in 6 months or sooner for any questions/problems before then.

## 2017-03-14 LAB — GLUCOSE, CAPILLARY: GLUCOSE-CAPILLARY: 155 mg/dL — AB (ref 65–99)

## 2017-03-15 ENCOUNTER — Other Ambulatory Visit: Payer: Self-pay | Admitting: Family

## 2017-03-22 ENCOUNTER — Ambulatory Visit: Payer: Medicare Other | Admitting: Podiatry

## 2017-03-22 DIAGNOSIS — E0842 Diabetes mellitus due to underlying condition with diabetic polyneuropathy: Secondary | ICD-10-CM

## 2017-03-22 NOTE — Patient Instructions (Signed)

## 2017-03-25 ENCOUNTER — Other Ambulatory Visit: Payer: Self-pay | Admitting: Family

## 2017-03-26 ENCOUNTER — Telehealth: Payer: Self-pay | Admitting: Surgery

## 2017-03-26 NOTE — Telephone Encounter (Signed)
Patient was given an appointment to see Dr Gabriel Carina with endocrinology for 11/25/16. Patient called and rescheduled this appointment to 02/27/17. Patient did not show for this appointment and did not want to reschedule.

## 2017-03-26 NOTE — Progress Notes (Signed)
Patient presents for diabetic shoe pick up, shoes are tried on for good fit.  Patient received 1 Pair and 3 pairs custom molded diabetic inserts.  Verbal and written break in and wear instructions given.  Patient will follow up for scheduled routine care.   Diagnosis  Diabetes with neuropathy.  ROV  Pick up diabetic shoes.  Patient presents today and was dispensed 0ne pair ( two units) of medically necessary extra depth shoes with three pair( six units) of custom molded multiple density inserts. The shoes and the inserts are fitted to the patients ' feet and are noted to fit well and are free of defect.  Length and width of the shoes are also acceptable.  Patient was given written and verbal  instructions for wearing.  If any concerns arrive with the shoes or inserts, the patient is to call the office.Patient is to follow up with doctor in six weeks.   Gardiner Barefoot DPM

## 2017-03-30 ENCOUNTER — Ambulatory Visit
Admission: RE | Admit: 2017-03-30 | Discharge: 2017-03-30 | Disposition: A | Discharge status: Home / Self Care | Payer: Medicare Other | Source: Ambulatory Visit | Attending: Family Medicine | Admitting: Family Medicine

## 2017-03-30 DIAGNOSIS — R921 Mammographic calcification found on diagnostic imaging of breast: Secondary | ICD-10-CM | POA: Diagnosis not present

## 2017-03-30 DIAGNOSIS — R928 Other abnormal and inconclusive findings on diagnostic imaging of breast: Secondary | ICD-10-CM

## 2017-03-30 DIAGNOSIS — N6011 Diffuse cystic mastopathy of right breast: Secondary | ICD-10-CM | POA: Insufficient documentation

## 2017-04-04 ENCOUNTER — Other Ambulatory Visit: Payer: Self-pay

## 2017-04-05 ENCOUNTER — Encounter: Payer: Self-pay | Admitting: General Surgery

## 2017-04-05 ENCOUNTER — Ambulatory Visit (INDEPENDENT_AMBULATORY_CARE_PROVIDER_SITE_OTHER): Payer: Medicare Other | Admitting: General Surgery

## 2017-04-05 VITALS — BP 124/74 | HR 83 | Temp 97.7°F | Ht 61.0 in | Wt 283.0 lb

## 2017-04-05 DIAGNOSIS — L732 Hidradenitis suppurativa: Secondary | ICD-10-CM

## 2017-04-05 MED ORDER — DOXYCYCLINE HYCLATE 50 MG PO CAPS: 50.0000 mg | ORAL_CAPSULE | Freq: Two times a day (BID) | ORAL | 0 refills | Status: DC

## 2017-04-05 MED ORDER — CLINDAMYCIN PHOSPHATE 1 % EX GEL: 1.0000 "application " | Freq: Two times a day (BID) | CUTANEOUS | 0 refills | Status: DC

## 2017-04-05 NOTE — Progress Notes (Signed)
Outpatient Surgical Follow Up  04/05/2017  [name] [name] is an 62 y.o. female.   Chief Complaint  Patient presents with  . Follow-up    Hidradenitis    HPI: 62 year old female with a known history of hidradenitis returns for follow-up.  She desires to have the area to her left axilla and left groin checked due to recent drainage.  She states that her blood sugars currently are well controlled but she has had wide fluctuations since her last visit.  She has not required any oral antibiotics for the last several months.  Otherwise she denies any fevers, chills, nausea, vomiting, chest pain, shortness of breath, diarrhea, constipation.  She has gained weight since her last visit but states she has been increasing her activity.  Past Medical History:  Diagnosis Date  . Allergic rhinitis   . Allergy   . Asthma   . Back pain   . CHF (congestive heart failure) ([place])   . COPD (chronic obstructive pulmonary disease) ([place])   . Degenerative joint disease of knee, left   . Depression   . Diabetes mellitus without complication ([place])   . Edema   . Elevated lipids   . GERD (gastroesophageal reflux disease)   . Hidradenitis   . History of colonic polyps   . Hypertension   . Hypothyroidism   . IBS (irritable bowel syndrome)   . Insomnia   . Obesity   . Oxygen dependent   . Pneumonia   . PVD (peripheral vascular disease) ([place])   . Sinusitis, chronic   . Sleep apnea   . Thyroid disease   . Vaginitis, atrophic   . Vertigo   . Vitamin D deficiency     Past Surgical History:  Procedure Laterality Date  . ABDOMINAL HYSTERECTOMY    . AXILLARY HIDRADENITIS EXCISION    . BREAST EXCISIONAL BIOPSY Left 12/2015   neg  . CESAREAN SECTION     x 2  . CHOLECYSTECTOMY    . HEEL SPUR EXCISION N/A   . HYDRADENITIS EXCISION Right 12/31/2015   Procedure: EXCISION HIDRADENITIS AXILLA;  Surgeon: Clayburn Pert, MD;  Location: [facility] ORS;  Service: General;  Laterality: Right;  . KNEE SURGERY Left     1998  . TONSILLECTOMY      Family History  Problem Relation Age of Onset  . Rashes / Skin problems Father   . Hypertension Father   . Heart disease Father   . Breast cancer Paternal Grandmother   . COPD Mother   . Kidney cancer Neg Hx   . Bladder Cancer Neg Hx     Social History:  reports that she quit smoking about 23 years ago. she has never used smokeless tobacco. She reports that she does not drink alcohol or use drugs.  Allergies:  Allergies  Allergen Reactions  . Codeine Itching    Medications reviewed.    ROS A multipoint review of systems was completed, all pertinent positives and negatives are documented within the HPI and the remainder are negative   BP 124/74   Pulse 83   Temp 97.7 F (36.5 C) (Oral)   Ht 5\' 1"  (1.549 m)   Wt 128.4 kg (283 lb)   BMI 53.47 kg/m   Physical Exam  General: No acute distress Neck: Supple nontender, well-healed scar to the left posterior neck from prior hidradenitis Chest: Clear to auscultation Breast: Well-healed area to the left breast from prior hidradenitis flare Heart: Regular rate and rhythm Abdomen: Soft and nontender, large  Skin: Bilateral axilla with well-healed prior hidradenitis areas.  No active drainage or infection currently.  Left groin with a 2 x 1 cm area of induration and irritation from recent hidradenitis flare.  No current drainage visualized.   No results found for this or any previous visit (from the past 48 hour(s)). No results found.  Assessment/Plan:  1. Hidradenitis 62 year old female with multiple areas of recurrent hidradenitis.  No current abscesses or areas that need to be drained.  Discussed that there is no current indication for any surgical intervention.  Had another long talk about the underlying cause of her hidradenitis being her size, her steroid use, or diabetes.  Discussed that I would provide her with a prescription for doxycycline to have should there be any further evidence  of infection so that she can self medicate.  Also a refill for her topical clindamycin was called in.  She will follow-up in clinic on as-needed basis.  A total of 15 minutes was used on this encounter with greater than 50% of it used for counseling or coordination of care     Clayburn Pert, MD [facility] General Surgeon  04/05/2017,3:43 PM

## 2017-04-05 NOTE — Patient Instructions (Signed)
Please pick up your medicine at the pharmacy.  Please continue keeping the areas clean and dry.   Please call our office if you have questions or concerns.

## 2017-06-25 ENCOUNTER — Other Ambulatory Visit: Payer: Self-pay

## 2017-06-25 ENCOUNTER — Emergency Department: Payer: Medicare Other

## 2017-06-25 ENCOUNTER — Emergency Department
Admission: EM | Admit: 2017-06-25 | Discharge: 2017-06-25 | Disposition: A | Discharge status: Home / Self Care | Payer: Medicare Other | Attending: Emergency Medicine | Admitting: Emergency Medicine

## 2017-06-25 ENCOUNTER — Encounter: Payer: Self-pay | Admitting: Emergency Medicine

## 2017-06-25 DIAGNOSIS — M79605 Pain in left leg: Secondary | ICD-10-CM | POA: Diagnosis not present

## 2017-06-25 DIAGNOSIS — L0291 Cutaneous abscess, unspecified: Secondary | ICD-10-CM

## 2017-06-25 DIAGNOSIS — Z79899 Other long term (current) drug therapy: Secondary | ICD-10-CM | POA: Insufficient documentation

## 2017-06-25 DIAGNOSIS — Z87891 Personal history of nicotine dependence: Secondary | ICD-10-CM | POA: Diagnosis not present

## 2017-06-25 DIAGNOSIS — L02215 Cutaneous abscess of perineum: Secondary | ICD-10-CM | POA: Diagnosis present

## 2017-06-25 DIAGNOSIS — J449 Chronic obstructive pulmonary disease, unspecified: Secondary | ICD-10-CM | POA: Insufficient documentation

## 2017-06-25 DIAGNOSIS — E119 Type 2 diabetes mellitus without complications: Secondary | ICD-10-CM | POA: Diagnosis not present

## 2017-06-25 DIAGNOSIS — I10 Essential (primary) hypertension: Secondary | ICD-10-CM | POA: Insufficient documentation

## 2017-06-25 DIAGNOSIS — Z794 Long term (current) use of insulin: Secondary | ICD-10-CM | POA: Diagnosis not present

## 2017-06-25 LAB — COMPREHENSIVE METABOLIC PANEL
ALT: 11 U/L — ABNORMAL LOW (ref 14–54)
AST: 19 U/L (ref 15–41)
Albumin: 3.4 g/dL — ABNORMAL LOW (ref 3.5–5.0)
Alkaline Phosphatase: 122 U/L (ref 38–126)
Anion gap: 9 (ref 5–15)
BUN: 16 mg/dL (ref 6–20)
CHLORIDE: 100 mmol/L — AB (ref 101–111)
CO2: 27 mmol/L (ref 22–32)
CREATININE: 0.85 mg/dL (ref 0.44–1.00)
Calcium: 9.1 mg/dL (ref 8.9–10.3)
Glucose, Bld: 113 mg/dL — ABNORMAL HIGH (ref 65–99)
Potassium: 3.5 mmol/L (ref 3.5–5.1)
SODIUM: 136 mmol/L (ref 135–145)
Total Bilirubin: 0.6 mg/dL (ref 0.3–1.2)
Total Protein: 7.4 g/dL (ref 6.5–8.1)

## 2017-06-25 LAB — CBC
HEMATOCRIT: 37.1 % (ref 35.0–47.0)
Hemoglobin: 11.4 g/dL — ABNORMAL LOW (ref 12.0–16.0)
MCH: 21.1 pg — ABNORMAL LOW (ref 26.0–34.0)
MCHC: 30.8 g/dL — ABNORMAL LOW (ref 32.0–36.0)
MCV: 68.5 fL — AB (ref 80.0–100.0)
PLATELETS: 375 10*3/uL (ref 150–440)
RBC: 5.41 MIL/uL — ABNORMAL HIGH (ref 3.80–5.20)
RDW: 17.6 % — ABNORMAL HIGH (ref 11.5–14.5)
WBC: 14 10*3/uL — AB (ref 3.6–11.0)

## 2017-06-25 LAB — GLUCOSE, CAPILLARY
Glucose-Capillary: 56 mg/dL — ABNORMAL LOW (ref 65–99)
Glucose-Capillary: 68 mg/dL (ref 65–99)

## 2017-06-25 MED ORDER — ACETAMINOPHEN 500 MG PO TABS
1000.0000 mg | ORAL_TABLET | Freq: Once | ORAL | Status: AC
  Administered 2017-06-25: 1000 mg via ORAL
  Filled 2017-06-25: qty 2

## 2017-06-25 MED ORDER — SULFAMETHOXAZOLE-TRIMETHOPRIM 800-160 MG PO TABS: 1.0000 | ORAL_TABLET | Freq: Two times a day (BID) | ORAL | 0 refills | Status: AC

## 2017-06-25 MED ORDER — OXYCODONE HCL 5 MG PO TABS
5.0000 mg | ORAL_TABLET | Freq: Once | ORAL | Status: AC
  Administered 2017-06-25: 5 mg via ORAL
  Filled 2017-06-25: qty 1

## 2017-06-25 MED ORDER — SULFAMETHOXAZOLE-TRIMETHOPRIM 800-160 MG PO TABS
1.0000 | ORAL_TABLET | Freq: Once | ORAL | Status: AC
  Administered 2017-06-25: 1 via ORAL
  Filled 2017-06-25: qty 1

## 2017-06-25 MED ORDER — LIDOCAINE HCL (PF) 1 % IJ SOLN
5.0000 mL | Freq: Once | INTRAMUSCULAR | Status: AC
  Administered 2017-06-25: 5 mL
  Filled 2017-06-25: qty 5

## 2017-06-25 NOTE — ED Triage Notes (Signed)
Has one new red area on left lower abdomen.  And old draining areas ingroin and abd.  Is already on doxy by dermattology.

## 2017-06-25 NOTE — ED Triage Notes (Signed)
Says pain left leg since yesterday.  No injury.  Looked under abd and has abscess there now.  Has other abscesses as well.

## 2017-06-25 NOTE — ED Notes (Signed)
Report to Allison, RN

## 2017-06-25 NOTE — ED Notes (Addendum)
Left hip pain radiating down to left heel, "feels like gout". Hx of abscesses, one to perineal area, causing great pain X 1 week. Pt alert and oriented X4, active, cooperative, pt in NAD. RR even and unlabored, color WNL.    Unable to assess abscess due to being in hallway bed.

## 2017-06-25 NOTE — ED Provider Notes (Signed)
[facility] Emergency Department Provider Note  ____________________________________________  Time seen: Approximately 7:35 PM  I have reviewed the triage vital signs and the nursing notes.   HISTORY  Chief Complaint Abscess and Leg Pain   HPI [name] [name] is a 62 y.o. female with a history of diabetes, CHF, COPD, hidradenitis, PVD, and several abscess who presents for evaluation of leg pain.  Patient reports 2-3 days of progressively worsening pain of her left lower extremity.  The pain starts on the hip and radiates down the entire leg.  The pain is worse with ambulation and sharp. Today she has had difficulty walking due to the pain.  Patient denies ever having similar pain.  She denies recent trauma.  No fever or swelling of her leg.  No prior history of PEs or DVTs. She is also complaining of an abscess in her pubic region.  Patient has had several abscesses in that area.  Several that have recently been I&D and look well-healing at this time.  She is currently on a 49-monthregimen of doxycycline that she was put on by her dermatologist to try to prevent these abscesses from forming.   Past Medical History:  Diagnosis Date  . Allergic rhinitis   . Allergy   . Asthma   . Back pain   . CHF (congestive heart failure) ([place]   . COPD (chronic obstructive pulmonary disease) ([place]   . Degenerative joint disease of knee, left   . Depression   . Diabetes mellitus without complication ([place]   . Edema   . Elevated lipids   . GERD (gastroesophageal reflux disease)   . Hidradenitis   . History of colonic polyps   . Hypertension   . Hypothyroidism   . IBS (irritable bowel syndrome)   . Insomnia   . Obesity   . Oxygen dependent   . Pneumonia   . PVD (peripheral vascular disease) ([place]   . Sinusitis, chronic   . Sleep apnea   . Thyroid disease   . Vaginitis, atrophic   . Vertigo   . Vitamin D deficiency     Patient Active Problem List   Diagnosis  Date Noted  . COPD (chronic obstructive pulmonary disease) ([place] 08/25/2016  . Acute on chronic heart failure with normal ejection fraction ([place] 08/20/2016  . Chronic diastolic heart failure ([place] 06/02/2016  . Bilateral lower leg cellulitis 05/23/2016  . Leukocytosis 05/23/2016  . Iron deficiency anemia 05/23/2016  . Thrombocytosis ([place] 05/23/2016  . COPD with acute exacerbation ([place] 05/21/2016  . Pulmonary hypertension ([place] 12/14/2015  . Atypical chest pain 10/29/2015  . Pneumonia 09/08/2015  . Breast abscess 06/02/2015  . Abnormal mammogram of both breasts 02/25/2015  . Hidradenitis 12/30/2014  . Diabetes (HP[place] 12/30/2014  . Essential hypertension 12/30/2014  . Asthma 12/30/2014    Past Surgical History:  Procedure Laterality Date  . ABDOMINAL HYSTERECTOMY    . AXILLARY HIDRADENITIS EXCISION    . BREAST EXCISIONAL BIOPSY Left 12/2015   neg  . CESAREAN SECTION     x 2  . CHOLECYSTECTOMY    . HEEL SPUR EXCISION N/A   . HYDRADENITIS EXCISION Right 12/31/2015   Procedure: EXCISION HIDRADENITIS AXILLA;  Surgeon: CClayburn Pert MD;  Location: [facility] ORS;  Service: General;  Laterality: Right;  . KNEE SURGERY Left    1998  . TONSILLECTOMY      Prior to Admission medications   Medication Sig Start Date End Date Taking? Authorizing Provider  acetaminophen (TYLENOL)  500 MG tablet Take 2 tablets (1,000 mg total) by mouth every 8 (eight) hours as needed for mild pain or moderate pain. 12/21/16 12/21/17  Eula Listen, MD  albuterol (PROVENTIL HFA;VENTOLIN HFA) 108 (90 Base) MCG/ACT inhaler Inhale 2 puffs into the lungs every 6 (six) hours as needed for wheezing or shortness of breath.    [provider]  albuterol (PROVENTIL) (2.5 MG/3ML) 0.083% nebulizer solution Take 2.5 mg by nebulization every 6 (six) hours as needed for wheezing or shortness of breath.     [provider]  atorvastatin (LIPITOR) 20 MG tablet Take 20 mg by mouth at bedtime.      [provider]  benzonatate (TESSALON) 100 MG capsule Take 100 mg by mouth 3 (three) times daily as needed for cough.    [provider]  budesonide-formoterol (SYMBICORT) 160-4.5 MCG/ACT inhaler Inhale 2 puffs into the lungs 2 (two) times daily.    [provider]  Chlorhexidine Gluconate 4 % SOLN Apply 1 application topically 2 (two) times daily. 09/06/16   Clayburn Pert, MD  clindamycin (CLINDAGEL) 1 % gel Apply 1 application topically 2 (two) times daily. 04/05/17   Clayburn Pert, MD  DALIRESP 500 MCG TABS tablet  03/19/17   [provider]  doxepin (SINEQUAN) 50 MG capsule Take 50 mg by mouth at bedtime.    [provider]  doxycycline (VIBRAMYCIN) 50 MG capsule Take 1 capsule (50 mg total) by mouth 2 (two) times daily. 04/05/17   Clayburn Pert, MD  DULoxetine (CYMBALTA) 20 MG capsule Take 20 mg by mouth daily.    [provider]  fluconazole (DIFLUCAN) 150 MG tablet Take 150 mg by mouth daily.    [provider]  gabapentin (NEURONTIN) 300 MG capsule  03/25/17   [provider]  glucose blood test strip TEST three times a day 12/30/15   [provider]  HUMULIN R U-500 KWIKPEN 500 UNIT/ML injection Inject 120 Units into the skin 2 (two) times daily with a meal.     [provider]  ibuprofen (ADVIL,MOTRIN) 800 MG tablet Take 1 tablet (800 mg total) by mouth every 8 (eight) hours as needed for mild pain or moderate pain (with food). 12/21/16   Eula Listen, MD  ketoconazole (NIZORAL) 2 % shampoo Apply 1 application topically 2 (two) times a week.    [provider]  Lancets Misc. (ACCU-CHEK FASTCLIX LANCET) KIT  12/27/15   [provider]  levothyroxine (SYNTHROID, LEVOTHROID) 200 MCG tablet Take 200 mcg by mouth daily before breakfast.     [provider]  meclizine (ANTIVERT) 25 MG tablet Take 25 mg by mouth 3 (three) times daily as needed for dizziness.      [provider]  mometasone (NASONEX) 50 MCG/ACT nasal spray Place 2 sprays into both nostrils daily.     [provider]  montelukast (SINGULAIR) 10 MG tablet Take 10 mg by mouth at bedtime.     [provider]  naphazoline-glycerin (CLEAR EYES) 0.012-0.2 % SOLN Place 1-2 drops into both eyes 4 (four) times daily as needed for irritation. 05/23/16   Theodoro Grist, MD  olopatadine (PATANOL) 0.1 % ophthalmic solution Place 1 drop into both eyes 2 (two) times daily.    [provider]  omeprazole (PRILOSEC) 40 MG capsule Take 40 mg by mouth daily.    [provider]  potassium citrate (UROCIT-K) 10 MEQ (1080 MG) SR tablet Take 2 tablets (20 mEq total) by mouth 2 (two) times  daily. 12/20/16   Alisa Graff, FNP  sulfamethoxazole-trimethoprim (BACTRIM DS,SEPTRA DS) 800-160 MG tablet Take 1 tablet by mouth 2 (two) times daily for 7 days. 06/25/17 07/02/17  Rudene Re, MD  torsemide (DEMADEX) 20 MG tablet TAKE 2 TABLETS BY MOUTH TWICE A DAY 03/25/17   Hackney, Tina A, FNP  TRADJENTA 5 MG TABS tablet Take 5 mg by mouth daily.     [provider]  traMADol (ULTRAM) 50 MG tablet Take 1 tablet (50 mg total) by mouth every 6 (six) hours as needed. 10/22/16 10/22/17  Little, Traci M, PA-C  traZODone (DESYREL) 50 MG tablet Take 50-100 mg by mouth at bedtime as needed for sleep.     [provider]    Allergies Codeine  Family History  Problem Relation Age of Onset  . Rashes / Skin problems Father   . Hypertension Father   . Heart disease Father   . Breast cancer Paternal Grandmother   . COPD Mother   . Kidney cancer Neg Hx   . Bladder Cancer Neg Hx     Social History Social History   Tobacco Use  . Smoking status: Former Smoker    Last attempt to quit: 12/18/1993    Years since quitting: 23.5  . Smokeless tobacco: Never Used  Substance Use Topics  . Alcohol use: No  . Drug use: No    Review of Systems  Constitutional: Negative  for fever. Eyes: Negative for visual changes. ENT: Negative for sore throat. Neck: No neck pain  Cardiovascular: Negative for chest pain. Respiratory: Negative for shortness of breath. Gastrointestinal: Negative for abdominal pain, vomiting or diarrhea. Genitourinary: Negative for dysuria. Musculoskeletal: Negative for back pain. + Left leg pain Skin: Negative for rash. + abscess Neurological: Negative for headaches, weakness or numbness. Psych: No SI or HI  ____________________________________________   PHYSICAL EXAM:  VITAL SIGNS: ED Triage Vitals  Enc Vitals Group     BP 06/25/17 1339 115/61     Pulse Rate 06/25/17 1339 94     Resp 06/25/17 1339 16     Temp 06/25/17 1339 98.7 F (37.1 C)     Temp Source 06/25/17 1339 Oral     SpO2 06/25/17 1339 95 %     Weight 06/25/17 1340 272 lb (123.4 kg)     Height 06/25/17 1340 _0  (1.575 m)     Head Circumference --      Peak Flow --      Pain Score 06/25/17 1340 8     Pain Loc --      Pain Edu? --      Excl. in [place]? --     Constitutional: Alert and oriented. Well appearing and in no apparent distress. HEENT:      Head: Normocephalic and atraumatic.         Eyes: Conjunctivae are normal. Sclera is non-icteric.       Mouth/Throat: Mucous membranes are moist.       Neck: Supple with no signs of meningismus. Cardiovascular: Regular rate and rhythm. No murmurs, gallops, or rubs. 2+ symmetrical distal pulses are present in all extremities. No JVD. Respiratory: Normal respiratory effort. Lungs are clear to auscultation bilaterally. No wheezes, crackles, or rhonchi.  Gastrointestinal: Soft, non tender, and non distended with positive bowel sounds. No rebound or guarding. Musculoskeletal: bilateral skin changes of chronic vascular disease, no edema, erythema or warmth, full painless ROM in all joints on the LLE, 2+ PT and DP pulses, extremity is well  perfused. Neurologic: Normal speech and language. Face is symmetric. Moving all  extremities. No gross focal neurologic deficits are appreciated. Skin: Skin is warm, dry and intact. No rash noted. There is a small abscess with overlying erythema on the pubis region Psychiatric: Mood and affect are normal. Speech and behavior are normal.  ____________________________________________   LABS (all labs ordered are listed, but only abnormal results are displayed)  Labs Reviewed  COMPREHENSIVE METABOLIC PANEL - Abnormal; Notable for the following components:      Result Value   Chloride 100 (*)    Glucose, Bld 113 (*)    Albumin 3.4 (*)    ALT 11 (*)    All other components within normal limits  CBC - Abnormal; Notable for the following components:   WBC 14.0 (*)    RBC 5.41 (*)    Hemoglobin 11.4 (*)    MCV 68.5 (*)    MCH 21.1 (*)    MCHC 30.8 (*)    RDW 17.6 (*)    All other components within normal limits  GLUCOSE, CAPILLARY - Abnormal; Notable for the following components:   Glucose-Capillary 56 (*)    All other components within normal limits  GLUCOSE, CAPILLARY  CBG MONITORING, ED   ____________________________________________  EKG  none  ____________________________________________  RADIOLOGY  I have personally reviewed the images performed during this visit and I agree with the Radiologist's read.   Interpretation by Radiologist:  US Venous Img Lower Unilateral Left  Result Date: 06/25/2017 CLINICAL DATA:  Acute left lower extremity pain. EXAM: Left LOWER EXTREMITY VENOUS DOPPLER ULTRASOUND TECHNIQUE: Gray-scale sonography with graded compression, as well as color Doppler and duplex ultrasound were performed to evaluate the lower extremity deep venous systems from the level of the common femoral vein and including the common femoral, femoral, profunda femoral, popliteal and calf veins including the posterior tibial, peroneal and gastrocnemius veins when visible. The superficial great saphenous vein was also interrogated. Spectral Doppler was  utilized to evaluate flow at rest and with distal augmentation maneuvers in the common femoral, femoral and popliteal veins. COMPARISON:  Ultrasound of July 12, 2014. FINDINGS: Contralateral Common Femoral Vein: Respiratory phasicity is normal and symmetric with the symptomatic side. No evidence of thrombus. Normal compressibility. Common Femoral Vein: No evidence of thrombus. Normal compressibility, respiratory phasicity and response to augmentation. Saphenofemoral Junction: No evidence of thrombus. Normal compressibility and flow on color Doppler imaging. Profunda Femoral Vein: No evidence of thrombus. Normal compressibility and flow on color Doppler imaging. Femoral Vein: No evidence of thrombus. Normal compressibility, respiratory phasicity and response to augmentation. Popliteal Vein: No evidence of thrombus. Normal compressibility, respiratory phasicity and response to augmentation. Calf Veins: No evidence of thrombus. Normal compressibility and flow on color Doppler imaging. Venous Reflux:  None. Other Findings:  None. IMPRESSION: No evidence of deep venous thrombosis seen in left lower extremity. Electronically Signed   By: Marijo Conception, M.D.   On: 06/25/2017 19:45   Dg Hip Unilat W Or Wo Pelvis 2-3 Views Left  Result Date: 06/25/2017 CLINICAL DATA:  Left hip pain EXAM: DG HIP (WITH OR WITHOUT PELVIS) 2-3V LEFT COMPARISON:  None. FINDINGS: Mild osteopenia. There is no evidence of hip fracture or dislocation. Mild degenerative changes involve both hips. There is no evidence of arthropathy or other focal bone abnormality. IMPRESSION: No acute fracture or dislocation. Electronically Signed   By: Kerby Moors M.D.   On: 06/25/2017 19:25     ____________________________________________   PROCEDURES  Procedure(s)  performed:yes .Marland KitchenIncision and Drainage Date/Time: 06/25/2017 8:16 PM Performed by: Rudene Re, MD Authorized by: Rudene Re, MD   Consent:    Consent obtained:   Verbal   Consent given by:  Patient   Risks discussed:  Bleeding, infection, incomplete drainage and pain   Alternatives discussed:  Alternative treatment, delayed treatment and observation Location:    Type:  Abscess   Size:  2   Location:  Anogenital   Anogenital location:  Perineum Pre-procedure details:    Skin preparation:  Chloraprep Anesthesia (see MAR for exact dosages):    Anesthesia method:  Local infiltration   Local anesthetic:  Lidocaine 1% w/o epi Procedure type:    Complexity:  Complex Procedure details:    Incision types:  Stab incision   Scalpel blade:  11   Wound management:  Probed and deloculated, irrigated with saline and extensive cleaning   Drainage:  Bloody   Drainage amount:  Scant   Wound treatment:  Wound left open Post-procedure details:    Patient tolerance of procedure:  Tolerated well, no immediate complications   Critical Care performed:  None ____________________________________________   INITIAL IMPRESSION / ASSESSMENT AND PLAN / ED COURSE  62 y.o. female with a history of diabetes, CHF, COPD, hidradenitis, PVD, and several abscess who presents for evaluation of left leg pain x 2 days.  There is no obvious source of pain on examination of the leg.  The extremity is warm and well perfused with intact normal pulses, no signs of trauma, no signs of cellulitis, all joints have full painless range of motion, no tenderness palpation of the bones.  We will send patient for ultrasound Doppler to rule out DVT and x-ray of her hip.  She does have an abscess in her pubis region which will be I&D.  Patient will be put on Bactrim for overlying cellulitis.  Patient has leukocytosis with white count of 14 however has no fever or tachycardia with no other signs of sepsis.    _________________________ 8:17 PM on 06/25/2017 -----------------------------------------  Doppler studies negative for DVT, x-rays with no acute findings.  Abscess was drained per  procedure note above.  Will put patient on Bactrim for cellulitis.  Recommend that she follows up with her primary care doctor in 2-3 days if the pain does not resolve.  Discussed return precautions with patient for worsening or new pain, redness or warmth, numbness or weakness.  On   As part of my medical decision making, I reviewed the following data within the [facility] notes reviewed and incorporated, Labs reviewed , Radiograph reviewed , Notes from prior ED visits and [place] Controlled Substance Database    Pertinent labs & imaging results that were available during my care of the patient were reviewed by me and considered in my medical decision making (see chart for details).    ____________________________________________   FINAL CLINICAL IMPRESSION(S) / ED DIAGNOSES  Final diagnoses:  Abscess  Left leg pain      NEW MEDICATIONS STARTED DURING THIS VISIT:  ED Discharge Orders        Ordered    sulfamethoxazole-trimethoprim (BACTRIM DS,SEPTRA DS) 800-160 MG tablet  2 times daily     06/25/17 2016       Note:  This document was prepared using Dragon voice recognition software and may include unintentional dictation errors.    Alfred Levins, [place], MD 06/25/17 2018

## 2017-06-25 NOTE — ED Notes (Signed)
Patient transported to X-ray 

## 2017-06-25 NOTE — ED Notes (Signed)
Pt assisted to toilet to void 

## 2017-06-25 NOTE — ED Notes (Signed)
Pt wants her blood sugar checked. Has not eaten today.  Given 4oz orange juice and meal tray due to low blood sugar.

## 2017-06-25 NOTE — Discharge Instructions (Addendum)
You have been seen in the Emergency Department (ED) today for an abscess. A skin abscess is a bacterial infection that forms a pocket of pus. A boil is a kind of skin abscess. This was drained in the ED. If you were prescribed antibiotics, please make sure to take it fully as prescribed.  Follow-up with your doctor in 24-48 hours for a re-check. If you do not have a doctor you may return to the ER for a re-check.  How can you care for yourself at home?  Apply warm and dry compresses, a heating pad set on low, or a hot water bottle 3 or 4 times a day for pain. Keep a cloth between the heat source and your skin.  If your doctor prescribed antibiotics, take them as directed. Do not stop taking them just because you feel better. You need to take the full course of antibiotics.  Take pain medicines exactly as directed.  If the doctor gave you a prescription medicine for pain, take it as prescribed.  If you are not taking a prescription pain medicine, ask your doctor if you can take an over-the-counter medicine. Keep your bandage clean and dry. Change the bandage whenever it gets wet or dirty, or at least one time a day.  If the abscess was packed with gauze:  Keep follow-up appointments to have the gauze changed or removed. If the doctor instructed you to remove the gauze, gently pull out all of the gauze when your doctor tells you to.  After the gauze is removed, soak the area in warm water for 15 to 20 minutes 2 times a day, until the wound closes.  When should you call for help?  Call your doctor now or seek immediate medical care if:  You have signs of worsening infection, such as:  Increased pain, swelling, warmth, or redness.  Red streaks leading from the infected skin.  Pus draining from the wound.  A fever. Uncontrolled nausea and vomiting Watch closely for changes in your health, and be sure to contact your doctor if:  You do not get better as expected.   When should you call for help?   Call your doctor now or seek immediate medical care if:  You have signs of worsening infection, such as:  Increased pain, swelling, warmth, or redness.  Red streaks leading from the infected skin.  Pus draining from the wound.  A fever. Uncontrolled nausea and vomiting Watch closely for changes in your health, and be sure to contact your doctor if:  You do not get better as expected  

## 2017-06-25 NOTE — ED Notes (Signed)
ED Provider at bedside. 

## 2017-08-24 ENCOUNTER — Ambulatory Visit: Payer: Medicare Other | Admitting: Family

## 2017-08-27 ENCOUNTER — Ambulatory Visit: Payer: Medicare Other | Admitting: Family

## 2017-08-27 ENCOUNTER — Telehealth: Payer: Self-pay | Admitting: Family

## 2017-08-27 NOTE — Telephone Encounter (Signed)
Patient did not show for her [facility] appointment on 08/27/17. Will attempt to reschedule.  

## 2017-10-01 ENCOUNTER — Other Ambulatory Visit: Payer: Self-pay

## 2017-10-01 ENCOUNTER — Ambulatory Visit: Payer: Medicare Other | Attending: Family | Admitting: Family

## 2017-10-01 ENCOUNTER — Encounter: Payer: Self-pay | Admitting: Family

## 2017-10-01 VITALS — BP 111/54 | HR 91 | Resp 18 | Ht 63.0 in | Wt 270.2 lb

## 2017-10-01 DIAGNOSIS — I5032 Chronic diastolic (congestive) heart failure: Secondary | ICD-10-CM | POA: Diagnosis present

## 2017-10-01 DIAGNOSIS — J449 Chronic obstructive pulmonary disease, unspecified: Secondary | ICD-10-CM | POA: Diagnosis not present

## 2017-10-01 DIAGNOSIS — I110 Hypertensive heart disease with heart failure: Secondary | ICD-10-CM | POA: Insufficient documentation

## 2017-10-01 DIAGNOSIS — Z7982 Long term (current) use of aspirin: Secondary | ICD-10-CM | POA: Insufficient documentation

## 2017-10-01 DIAGNOSIS — K219 Gastro-esophageal reflux disease without esophagitis: Secondary | ICD-10-CM | POA: Insufficient documentation

## 2017-10-01 DIAGNOSIS — E1151 Type 2 diabetes mellitus with diabetic peripheral angiopathy without gangrene: Secondary | ICD-10-CM | POA: Insufficient documentation

## 2017-10-01 DIAGNOSIS — Z87891 Personal history of nicotine dependence: Secondary | ICD-10-CM | POA: Insufficient documentation

## 2017-10-01 DIAGNOSIS — G4733 Obstructive sleep apnea (adult) (pediatric): Secondary | ICD-10-CM | POA: Insufficient documentation

## 2017-10-01 DIAGNOSIS — Z79899 Other long term (current) drug therapy: Secondary | ICD-10-CM | POA: Diagnosis not present

## 2017-10-01 DIAGNOSIS — I10 Essential (primary) hypertension: Secondary | ICD-10-CM

## 2017-10-01 DIAGNOSIS — E559 Vitamin D deficiency, unspecified: Secondary | ICD-10-CM | POA: Diagnosis not present

## 2017-10-01 DIAGNOSIS — Z794 Long term (current) use of insulin: Secondary | ICD-10-CM | POA: Insufficient documentation

## 2017-10-01 DIAGNOSIS — F329 Major depressive disorder, single episode, unspecified: Secondary | ICD-10-CM | POA: Insufficient documentation

## 2017-10-01 DIAGNOSIS — J410 Simple chronic bronchitis: Secondary | ICD-10-CM

## 2017-10-01 DIAGNOSIS — E119 Type 2 diabetes mellitus without complications: Secondary | ICD-10-CM

## 2017-10-01 LAB — GLUCOSE, CAPILLARY: Glucose-Capillary: 81 mg/dL (ref 70–99)

## 2017-10-01 NOTE — Patient Instructions (Addendum)
Resume weighing daily and call for an overnight weight gain of > 2 pounds or a weekly weight gain of >5 pounds. 

## 2017-10-01 NOTE — Progress Notes (Signed)
Patient ID: FELISIA BALCOM, female    DOB: Apr 19, 1955, 62 y.o.   MRN: [id number]  HPI  Ms Dinius is a 62 y/o female with a history of vitamin D deficiency, obstructive sleep apnea, GERD, pneumonia, PVD, DM, depression, DJD, COPD with oxygen, allergies, past tobacco use and chronic heart failure.   Last echo was done on 05/21/16 and showed an EF of 50% along with trivial MR/TR/PR. EF has declined slightly from June 2017.  Was in the ED 06/25/17 due to I & D of pubic abscess. Doppler negative for DVT and hip xray is normal. Released with antibiotics.   She presents today for her follow-up visit with a chief complaint of moderate fatigue upon minimal exertion. She describes this as chronic in nature having been present for several years. She has associated wheezing and light-headedness along with this. She denies any difficulty sleeping, abdominal distention, palpitations, pedal edema, chest pain, shortness of breath, cough or weight gain. Still doesn't check her glucose consistently, only when she feels like it might be high or low. No longer wearing oxygen, per her choice, because she felt like she was at a fall risk due to the long tubing. Says that she's breathing fine without it.   Past Medical History:  Diagnosis Date  . Allergic rhinitis   . Allergy   . Asthma   . Back pain   . CHF (congestive heart failure) ([place])   . COPD (chronic obstructive pulmonary disease) ([place])   . Degenerative joint disease of knee, left   . Depression   . Diabetes mellitus without complication ([place])   . Edema   . Elevated lipids   . GERD (gastroesophageal reflux disease)   . Hidradenitis   . History of colonic polyps   . Hypertension   . Hypothyroidism   . IBS (irritable bowel syndrome)   . Insomnia   . Obesity   . Oxygen dependent   . Pneumonia   . PVD (peripheral vascular disease) ([place])   . Sinusitis, chronic   . Sleep apnea   . Thyroid disease   . Vaginitis, atrophic   . Vertigo   . Vitamin D  deficiency    Past Surgical History:  Procedure Laterality Date  . ABDOMINAL HYSTERECTOMY    . AXILLARY HIDRADENITIS EXCISION    . BREAST EXCISIONAL BIOPSY Left 12/2015   neg  . CESAREAN SECTION     x 2  . CHOLECYSTECTOMY    . HEEL SPUR EXCISION N/A   . HYDRADENITIS EXCISION Right 12/31/2015   Procedure: EXCISION HIDRADENITIS AXILLA;  Surgeon: Clayburn Pert, MD;  Location: [facility] ORS;  Service: General;  Laterality: Right;  . KNEE SURGERY Left    1998  . TONSILLECTOMY     Family History  Problem Relation Age of Onset  . Rashes / Skin problems Father   . Hypertension Father   . Heart disease Father   . Breast cancer Paternal Grandmother   . COPD Mother   . Kidney cancer Neg Hx   . Bladder Cancer Neg Hx    Social History   Tobacco Use  . Smoking status: Former Smoker    Last attempt to quit: 12/18/1993    Years since quitting: 23.8  . Smokeless tobacco: Never Used  Substance Use Topics  . Alcohol use: No   Allergies  Allergen Reactions  . Codeine Itching   Prior to Admission medications   Medication Sig Start Date End Date Taking? Authorizing Provider  acetaminophen (TYLENOL) 500 MG  tablet Take 2 tablets (1,000 mg total) by mouth every 8 (eight) hours as needed for mild pain or moderate pain. 12/21/16 12/21/17 Yes Norman, Anne-Caroline, MD  albuterol (PROVENTIL HFA;VENTOLIN HFA) 108 (90 Base) MCG/ACT inhaler Inhale 2 puffs into the lungs every 6 (six) hours as needed for wheezing or shortness of breath.   Yes [provider]  albuterol (PROVENTIL) (2.5 MG/3ML) 0.083% nebulizer solution Take 2.5 mg by nebulization every 6 (six) hours as needed for wheezing or shortness of breath.    Yes [provider]  aspirin EC 81 MG tablet Take 81 mg by mouth daily.   Yes [provider]  atorvastatin (LIPITOR) 20 MG tablet Take 20 mg by mouth at bedtime.    Yes [provider]  benzonatate (TESSALON) 100 MG capsule Take 100 mg by mouth 3 (three)  times daily as needed for cough.   Yes [provider]  BIOTIN PO Take 500 mcg by mouth daily.   Yes [provider]  budesonide-formoterol (SYMBICORT) 160-4.5 MCG/ACT inhaler Inhale 2 puffs into the lungs 2 (two) times daily.   Yes [provider]  clindamycin (CLINDAGEL) 1 % gel Apply 1 application topically 2 (two) times daily. 04/05/17  Yes Woodham, Charles, MD  DALIRESP 500 MCG TABS tablet Take 500 mcg by mouth daily.  03/19/17  Yes [provider]  doxepin (SINEQUAN) 50 MG capsule Take 50 mg by mouth at bedtime.   Yes [provider]  Dulaglutide (TRULICITY) 0.75 MG/0.5ML SOPN Inject into the skin once a week.   Yes [provider]  DULoxetine (CYMBALTA) 20 MG capsule Take 20 mg by mouth daily.   Yes [provider]  fluocinonide (LIDEX) 0.05 % external solution Apply 1 application topically 2 (two) times daily.   Yes [provider]  gabapentin (NEURONTIN) 300 MG capsule Take 300 mg by mouth 2 (two) times daily.  03/25/17  Yes [provider]  glucose blood test strip TEST three times a day 12/30/15  Yes [provider]  HUMULIN R U-500 KWIKPEN 500 UNIT/ML injection Inject 120 Units into the skin 2 (two) times daily with a meal.    Yes [provider]  ibuprofen (ADVIL,MOTRIN) 800 MG tablet Take 1 tablet (800 mg total) by mouth every 8 (eight) hours as needed for mild pain or moderate pain (with food). 12/21/16  Yes Norman, Anne-Caroline, MD  ketoconazole (NIZORAL) 2 % shampoo Apply 1 application topically 2 (two) times a week.   Yes [provider]  Lancets Misc. (ACCU-CHEK FASTCLIX LANCET) KIT  12/27/15  Yes [provider]  levothyroxine (SYNTHROID, LEVOTHROID) 200 MCG tablet Take 200 mcg by mouth daily before breakfast.    Yes [provider]  mometasone (NASONEX) 50 MCG/ACT nasal spray Place 2 sprays into both nostrils daily.    Yes [provider]   montelukast (SINGULAIR) 10 MG tablet Take 10 mg by mouth at bedtime.    Yes [provider]  naphazoline-glycerin (CLEAR EYES) 0.012-0.2 % SOLN Place 1-2 drops into both eyes 4 (four) times daily as needed for irritation. 05/23/16  Yes Vaickute, Rima, MD  olopatadine (PATANOL) 0.1 % ophthalmic solution Place 1 drop into both eyes 2 (two) times daily.   Yes [provider]  omeprazole (PRILOSEC) 40 MG capsule Take 40 mg by mouth daily.   Yes [provider]  potassium citrate (UROCIT-K) 10 MEQ (1080 MG) SR tablet Take 2 tablets (20 mEq total) by mouth 2 (two) times daily. Patient   taking differently: Take 10 mEq by mouth 2 (two) times daily.  12/20/16  Yes ,  A, FNP  spironolactone (ALDACTONE) 50 MG tablet Take 50 mg by mouth daily.   Yes [provider]  torsemide (DEMADEX) 20 MG tablet TAKE 2 TABLETS BY MOUTH TWICE A DAY Patient taking differently: TAKE  1 TABLETS BY MOUTH daily 03/25/17  Yes ,  A, FNP  TRADJENTA 5 MG TABS tablet Take 5 mg by mouth daily.    Yes [provider]  traMADol (ULTRAM) 50 MG tablet Take 1 tablet (50 mg total) by mouth every 6 (six) hours as needed. 10/22/16 10/22/17 Yes Little, Traci M, PA-C  traZODone (DESYREL) 50 MG tablet Take 50-100 mg by mouth at bedtime as needed for sleep.    Yes [provider]    Review of Systems  Constitutional: Positive for fatigue. Negative for appetite change.  HENT: Negative for congestion, rhinorrhea and sore throat.   Eyes: Negative.   Respiratory: Positive for wheezing. Negative for cough, chest tightness and shortness of breath.   Cardiovascular: Negative for chest pain, palpitations and leg swelling.  Gastrointestinal: Negative for abdominal distention and abdominal pain.  Endocrine: Negative.   Genitourinary: Negative.   Musculoskeletal: Negative for back pain and neck pain.  Skin: Negative.   Allergic/Immunologic: Negative.   Neurological: Positive for  light-headedness (intermittent). Negative for dizziness.  Hematological: Negative for adenopathy. Does not bruise/bleed easily.  Psychiatric/Behavioral: Negative for dysphoric mood and sleep disturbance. The patient is not nervous/anxious.    Vitals:   10/01/17 1401  BP: (!) 111/54  Pulse: 91  Resp: 18  SpO2: 99%  Weight: 270 lb 4 oz (122.6 kg)  Height: 5' 3" (1.6 m)   Wt Readings from Last 3 Encounters:  10/01/17 270 lb 4 oz (122.6 kg)  06/25/17 272 lb (123.4 kg)  04/05/17 283 lb (128.4 kg)   Lab Results  Component Value Date   CREATININE 0.85 06/25/2017   CREATININE 0.89 12/21/2016   CREATININE 0.82 11/15/2016    Physical Exam  Constitutional: She is oriented to person, place, and time. She appears well-developed and well-nourished.  HENT:  Head: Normocephalic and atraumatic.  Neck: Normal range of motion. Neck supple. No JVD present.  Cardiovascular: Normal rate and regular rhythm.  Pulmonary/Chest: Effort normal. She has no wheezes. She has no rales.  Abdominal: Soft. She exhibits no distension. There is no tenderness.  Musculoskeletal: She exhibits no edema or tenderness.  Neurological: She is alert and oriented to person, place, and time.  Skin: Skin is warm and dry.  Psychiatric: She has a normal mood and affect. Her behavior is normal. Thought content normal.  Nursing note and vitals reviewed.    Assessment & Plan:  1: Chronic heart failure with preserved ejection fraction- - NYHA class III - euvolemic today - not weighing herself daily. Encouraged her to resume weighing daily so that she can call for an overnight weight gain of 2 lbs or a weekly weight gain of 5 lbs - weight down 14 pounds since she was last here 7 months ago - not adding salt and is using a salt substitute. Using meals on wheels for food; reminded her to follow a 2000 mg sodium diet - drinking about 48 ounces of fluid daily - PRN follow-up with cardiologist (F[place]); last seen 12/28/15 -  encouraged her to increase her activity - encouraged her to elevate her legs when at home  2: HTN- - BP looks good today - saw PCP   July 2018 at [facility] - BMP from 06/25/17 reviewed and showed sodium 136, potassium 3.5 and GFR >60  3: Diabetes- - nonfasting glucose in the office was 81 - admits that she's not checking her glucose consistently and rarely checks it prior to taking her insulin - counseled on the importance of eating with U500 insulin as well as checking her glucose consistently - A1C on 05/21/16 was 7.0% - saw endocrinologist ([place]) 09/12/17   4: COPD- - continue inhalers and Daliresp - no longer wearing oxygen per her choice - saw pulmonologist ([place]) 09/26/16  Medication bottles were reviewed.  Return in 4 months or sooner for any questions/problems before then.          

## 2018-01-28 ENCOUNTER — Encounter: Payer: Self-pay | Admitting: Family

## 2018-01-28 ENCOUNTER — Ambulatory Visit: Payer: Medicare Other | Attending: Family | Admitting: Family

## 2018-01-28 VITALS — BP 122/57 | HR 88 | Resp 18 | Ht 63.0 in | Wt 274.5 lb

## 2018-01-28 DIAGNOSIS — Z794 Long term (current) use of insulin: Secondary | ICD-10-CM | POA: Diagnosis not present

## 2018-01-28 DIAGNOSIS — F329 Major depressive disorder, single episode, unspecified: Secondary | ICD-10-CM | POA: Insufficient documentation

## 2018-01-28 DIAGNOSIS — E1151 Type 2 diabetes mellitus with diabetic peripheral angiopathy without gangrene: Secondary | ICD-10-CM | POA: Insufficient documentation

## 2018-01-28 DIAGNOSIS — I110 Hypertensive heart disease with heart failure: Secondary | ICD-10-CM | POA: Diagnosis not present

## 2018-01-28 DIAGNOSIS — Z79899 Other long term (current) drug therapy: Secondary | ICD-10-CM | POA: Diagnosis not present

## 2018-01-28 DIAGNOSIS — G4733 Obstructive sleep apnea (adult) (pediatric): Secondary | ICD-10-CM | POA: Diagnosis not present

## 2018-01-28 DIAGNOSIS — Z87891 Personal history of nicotine dependence: Secondary | ICD-10-CM | POA: Insufficient documentation

## 2018-01-28 DIAGNOSIS — E559 Vitamin D deficiency, unspecified: Secondary | ICD-10-CM | POA: Insufficient documentation

## 2018-01-28 DIAGNOSIS — Z885 Allergy status to narcotic agent status: Secondary | ICD-10-CM | POA: Insufficient documentation

## 2018-01-28 DIAGNOSIS — J449 Chronic obstructive pulmonary disease, unspecified: Secondary | ICD-10-CM | POA: Diagnosis not present

## 2018-01-28 DIAGNOSIS — J410 Simple chronic bronchitis: Secondary | ICD-10-CM

## 2018-01-28 DIAGNOSIS — I10 Essential (primary) hypertension: Secondary | ICD-10-CM

## 2018-01-28 DIAGNOSIS — Z7982 Long term (current) use of aspirin: Secondary | ICD-10-CM | POA: Insufficient documentation

## 2018-01-28 DIAGNOSIS — K219 Gastro-esophageal reflux disease without esophagitis: Secondary | ICD-10-CM | POA: Insufficient documentation

## 2018-01-28 DIAGNOSIS — I5032 Chronic diastolic (congestive) heart failure: Secondary | ICD-10-CM | POA: Diagnosis not present

## 2018-01-28 DIAGNOSIS — E119 Type 2 diabetes mellitus without complications: Secondary | ICD-10-CM

## 2018-01-28 LAB — GLUCOSE, CAPILLARY: Glucose-Capillary: 121 mg/dL — ABNORMAL HIGH (ref 70–99)

## 2018-01-28 NOTE — Progress Notes (Signed)
Patient ID: [name] [name], female    DOB: Oct 24, 1955, 62 y.o.   MRN: [id number]  HPI  [name] is a 62 y/o female with a history of vitamin D deficiency, obstructive sleep apnea, GERD, pneumonia, PVD, DM, depression, DJD, COPD with oxygen, allergies, past tobacco use and chronic heart failure.   Last echo was done on 05/21/16 and showed an EF of 50% along with trivial MR/TR/PR. EF has declined slightly from June 2017.  Has not been admitted or been in the ED in the last 6 months.   She presents today for her follow-up visit with a chief complaint of moderate fatigue upon minimal exertion. She describes this as chronic in nature having been present for several years. She has associated shortness of breath, wheezing, light-headedness, diarrhea and slight weight gain. She denies any difficulty sleeping, abdominal distention, palpitations, pedal edema, chest pain or cough.   Past Medical History:  Diagnosis Date  . Allergic rhinitis   . Allergy   . Asthma   . Back pain   . CHF (congestive heart failure) ([place])   . COPD (chronic obstructive pulmonary disease) ([place])   . Degenerative joint disease of knee, left   . Depression   . Diabetes mellitus without complication ([place])   . Edema   . Elevated lipids   . GERD (gastroesophageal reflux disease)   . Hidradenitis   . History of colonic polyps   . Hypertension   . Hypothyroidism   . IBS (irritable bowel syndrome)   . Insomnia   . Obesity   . Oxygen dependent   . Pneumonia   . PVD (peripheral vascular disease) ([place])   . Sinusitis, chronic   . Sleep apnea   . Thyroid disease   . Vaginitis, atrophic   . Vertigo   . Vitamin D deficiency    Past Surgical History:  Procedure Laterality Date  . ABDOMINAL HYSTERECTOMY    . AXILLARY HIDRADENITIS EXCISION    . BREAST EXCISIONAL BIOPSY Left 12/2015   neg  . CESAREAN SECTION     x 2  . CHOLECYSTECTOMY    . HEEL SPUR EXCISION N/A   . HYDRADENITIS EXCISION Right 12/31/2015   Procedure:  EXCISION HIDRADENITIS AXILLA;  Surgeon: Clayburn Pert, MD;  Location: [facility] ORS;  Service: General;  Laterality: Right;  . KNEE SURGERY Left    1998  . TONSILLECTOMY     Family History  Problem Relation Age of Onset  . Rashes / Skin problems Father   . Hypertension Father   . Heart disease Father   . Breast cancer Paternal Grandmother   . COPD Mother   . Kidney cancer Neg Hx   . Bladder Cancer Neg Hx    Social History   Tobacco Use  . Smoking status: Former Smoker    Last attempt to quit: 12/18/1993    Years since quitting: 24.1  . Smokeless tobacco: Never Used  Substance Use Topics  . Alcohol use: No   Allergies  Allergen Reactions  . Codeine Itching   Prior to Admission medications   Medication Sig Start Date End Date Taking? Authorizing Provider  albuterol (PROVENTIL HFA;VENTOLIN HFA) 108 (90 Base) MCG/ACT inhaler Inhale 2 puffs into the lungs every 6 (six) hours as needed for wheezing or shortness of breath.   Yes [provider]  albuterol (PROVENTIL) (2.5 MG/3ML) 0.083% nebulizer solution Take 2.5 mg by nebulization every 6 (six) hours as needed for wheezing or shortness of breath.    Yes  [provider]  aspirin EC 81 MG tablet Take 81 mg by mouth daily.   Yes [provider]  atorvastatin (LIPITOR) 20 MG tablet Take 20 mg by mouth at bedtime.    Yes [provider]  benzonatate (TESSALON) 100 MG capsule Take 100 mg by mouth 3 (three) times daily as needed for cough.   Yes [provider]  BIOTIN PO Take 500 mcg by mouth daily.   Yes [provider]  budesonide-formoterol (SYMBICORT) 160-4.5 MCG/ACT inhaler Inhale 2 puffs into the lungs 2 (two) times daily.   Yes [provider]  clindamycin (CLINDAGEL) 1 % gel Apply 1 application topically 2 (two) times daily. 04/05/17  Yes Clayburn Pert, MD  DALIRESP 500 MCG TABS tablet Take 500 mcg by mouth daily.  03/19/17  Yes [provider]  doxepin  (SINEQUAN) 50 MG capsule Take 50 mg by mouth at bedtime.   Yes [provider]  doxycycline (DORYX) 100 MG EC tablet Take 100 mg by mouth 2 (two) times daily.   Yes [provider]  Dulaglutide (TRULICITY) 4.27 CW/2.3JS SOPN Inject into the skin once a week.   Yes [provider]  DULoxetine (CYMBALTA) 20 MG capsule Take 20 mg by mouth daily.   Yes [provider]  fluocinonide (LIDEX) 0.05 % external solution Apply 1 application topically 2 (two) times daily.   Yes [provider]  gabapentin (NEURONTIN) 300 MG capsule Take 300 mg by mouth 2 (two) times daily.  03/25/17  Yes [provider]  glucose blood test strip TEST three times a day 12/30/15  Yes [provider]  HUMULIN R U-500 KWIKPEN 500 UNIT/ML injection Inject 120 Units into the skin 2 (two) times daily with a meal.    Yes [provider]  ibuprofen (ADVIL,MOTRIN) 800 MG tablet Take 1 tablet (800 mg total) by mouth every 8 (eight) hours as needed for mild pain or moderate pain (with food). 12/21/16  Yes Eula Listen, MD  ketoconazole (NIZORAL) 2 % shampoo Apply 1 application topically 2 (two) times a week.   Yes [provider]  Lancets Misc. (ACCU-CHEK FASTCLIX LANCET) KIT  12/27/15  Yes [provider]  levothyroxine (SYNTHROID, LEVOTHROID) 200 MCG tablet Take 200 mcg by mouth daily before breakfast.    Yes [provider]  LUTEIN PO Take 1 tablet by mouth daily.   Yes [provider]  meclizine (ANTIVERT) 25 MG tablet Take 25 mg by mouth 3 (three) times daily as needed for dizziness.   Yes [provider]  Melatonin 5 MG CAPS Take 2 capsules by mouth daily.   Yes [provider]  mometasone (NASONEX) 50 MCG/ACT nasal spray Place 2 sprays into both nostrils daily.    Yes [provider]  montelukast (SINGULAIR) 10 MG tablet Take 10 mg by mouth at bedtime.    Yes [provider]   naphazoline-glycerin (CLEAR EYES) 0.012-0.2 % SOLN Place 1-2 drops into both eyes 4 (four) times daily as needed for irritation. 05/23/16  Yes Theodoro Grist, MD  olopatadine (PATANOL) 0.1 % ophthalmic solution Place 1 drop into both eyes 2 (two) times daily.   Yes [provider]  omeprazole (PRILOSEC) 40 MG capsule Take 40 mg by mouth daily.   Yes [provider]  potassium citrate (UROCIT-K) 10 MEQ (1080 MG) SR tablet Take 10 mEq by mouth every morning.   Yes [provider]  spironolactone (ALDACTONE) 50 MG tablet Take 100 mg by mouth  daily.    Yes [provider]  torsemide (DEMADEX) 20 MG tablet TAKE 2 TABLETS BY MOUTH TWICE A DAY Patient taking differently: TAKE  1 TABLETS BY MOUTH daily 03/25/17  Yes Darylene Price A, FNP  traZODone (DESYREL) 50 MG tablet Take 50-100 mg by mouth at bedtime as needed for sleep.    Yes [provider]   Review of Systems  Constitutional: Positive for fatigue. Negative for appetite change.  HENT: Negative for congestion, rhinorrhea and sore throat.   Eyes: Negative.   Respiratory: Positive for shortness of breath and wheezing. Negative for cough and chest tightness.   Cardiovascular: Negative for chest pain, palpitations and leg swelling.  Gastrointestinal: Positive for diarrhea (took miralax 2 nights ago). Negative for abdominal distention and abdominal pain.  Endocrine: Negative.   Genitourinary: Negative.   Musculoskeletal: Negative for back pain and neck pain.  Skin: Negative.   Allergic/Immunologic: Negative.   Neurological: Positive for light-headedness (intermittent). Negative for dizziness.  Hematological: Negative for adenopathy. Does not bruise/bleed easily.  Psychiatric/Behavioral: Negative for dysphoric mood and sleep disturbance. The patient is not nervous/anxious.    Vitals:   01/28/18 1414  BP: (!) 122/57  Pulse: 88  Resp: 18  SpO2: 97%  Weight: 274 lb 8 oz (124.5 kg)  Height: _0  (1.6 m)    Wt Readings from Last 3 Encounters:  01/28/18 274 lb 8 oz (124.5 kg)  10/01/17 270 lb 4 oz (122.6 kg)  06/25/17 272 lb (123.4 kg)   Lab Results  Component Value Date   CREATININE 0.85 06/25/2017   CREATININE 0.89 12/21/2016   CREATININE 0.82 11/15/2016    Physical Exam  Constitutional: She is oriented to person, place, and time. She appears well-developed and well-nourished.  HENT:  Head: Normocephalic and atraumatic.  Neck: Normal range of motion. Neck supple. No JVD present.  Cardiovascular: Normal rate and regular rhythm.  Pulmonary/Chest: Effort normal. She has no wheezes. She has no rales.  Abdominal: Soft. She exhibits no distension. There is no tenderness.  Musculoskeletal: She exhibits no edema or tenderness.  Neurological: She is alert and oriented to person, place, and time.  Skin: Skin is warm and dry.  Psychiatric: She has a normal mood and affect. Her behavior is normal. Thought content normal.  Nursing note and vitals reviewed.    Assessment & Plan:  1: Chronic heart failure with preserved ejection fraction- - NYHA class III - euvolemic today - not weighing herself daily. Encouraged her to resume weighing daily so that she can call for an overnight weight gain of 2 lbs or a weekly weight gain of 5 lbs - weight up 4 pounds since her last visit here 4 months ago - not adding salt and is using a salt substitute. Using meals on wheels for food; reminded her to follow a 2000 mg sodium diet - drinking about 48 ounces of fluid daily - PRN follow-up with cardiologist [name]); last seen 12/28/15 - encouraged her to increase her activity - encouraged her to elevate her legs when at home - she says that she's received her flu vaccine for this season  2: HTN- - BP looks good today - follows with PCP at [place] from 06/25/17 reviewed and showed sodium 136, potassium 3.5 and GFR >60  3: Diabetes- - nonfasting glucose in the office was 121  (just ate 2 pieces of candy) - admits that she's not checking her glucose consistently and rarely checks it prior to taking  her insulin - counseled on the importance of eating with U500 insulin as well as checking her glucose consistently - A1C on 05/21/16 was 7.0% - saw endocrinologist ([place]) 09/12/17   4: COPD- - continue inhalers and Daliresp - wears oxygen at 2L when at home - saw pulmonologist [name]) 09/26/16  Medication bottles were reviewed.  Return in 6 months or sooner for any questions/problems before then.

## 2018-01-28 NOTE — Patient Instructions (Signed)
Resume weighing daily and call for an overnight weight gain of > 2 pounds or a weekly weight gain of >5 pounds. 

## 2018-02-28 ENCOUNTER — Other Ambulatory Visit: Payer: Self-pay | Admitting: Family

## 2018-02-28 MED ORDER — POTASSIUM CITRATE ER 10 MEQ (1080 MG) PO TBCR: 10.0000 meq | EXTENDED_RELEASE_TABLET | Freq: Every morning | ORAL | 3 refills | Status: DC

## 2018-05-02 ENCOUNTER — Other Ambulatory Visit: Payer: Self-pay | Admitting: Family

## 2018-05-02 MED ORDER — TORSEMIDE 20 MG PO TABS: ORAL_TABLET | ORAL | 3 refills | Status: DC

## 2018-05-16 ENCOUNTER — Other Ambulatory Visit: Payer: Self-pay | Admitting: Family Medicine

## 2018-05-16 DIAGNOSIS — Z1231 Encounter for screening mammogram for malignant neoplasm of breast: Secondary | ICD-10-CM

## 2018-05-21 IMAGING — CT CT ABD-PEL WO/W CM
2 of 6 series · 12 of 32 positions shown, 17 images · IV contrast (APPLIED)
Comparison: 10/21/2016 unenhanced CT abdomen/pelvis.

CLINICAL DATA: Gross hematuria. History of hysterectomy and
cholecystectomy.

EXAM:
CT ABDOMEN AND PELVIS WITHOUT AND WITH CONTRAST
TECHNIQUE: Multidetector CT imaging of the abdomen and pelvis was performed
following the standard protocol before and following the bolus
administration of intravenous contrast.
CONTRAST:  125mL PMBAM7-QSS IOPAMIDOL (PMBAM7-QSS) INJECTION 61%

[Series 7: axial post · axial · 0.94mm/px · z∈[-967,-627]mm · 6 of 96 slices shown]
[im 14/96  soft-tissue]
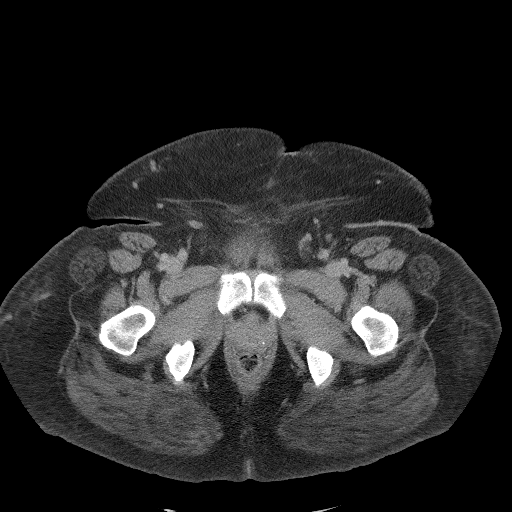
[im 28/96  soft-tissue]
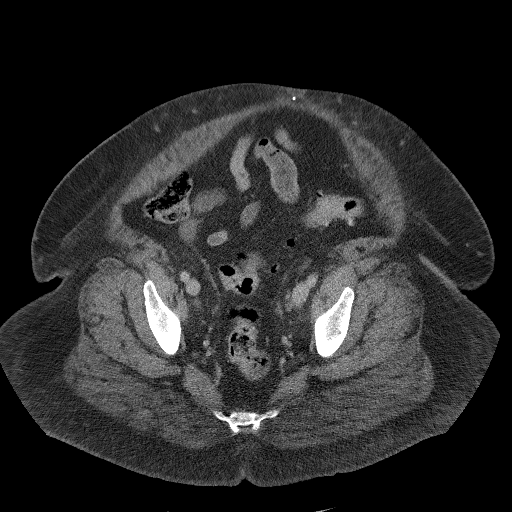
[im 41/96  soft-tissue]
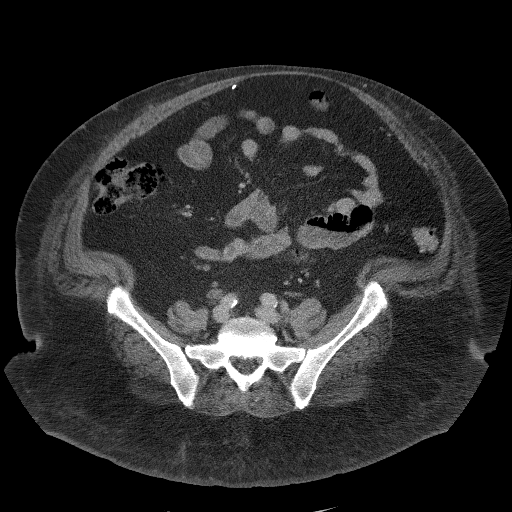
[im 55/96  soft-tissue]
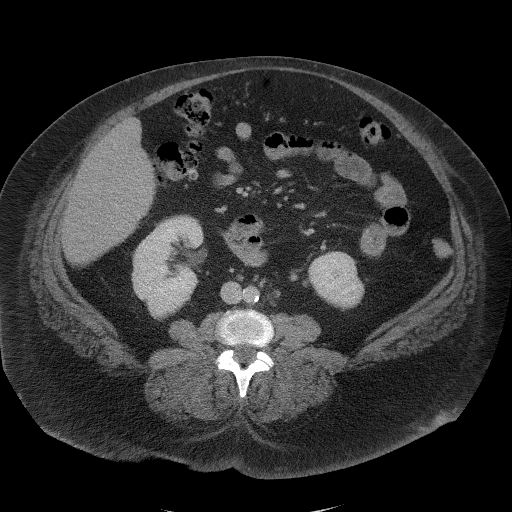
[im 68/96  soft-tissue]
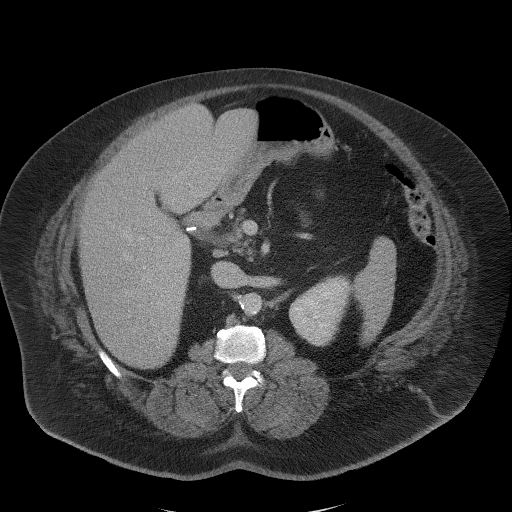
[im 82/96  soft-tissue]
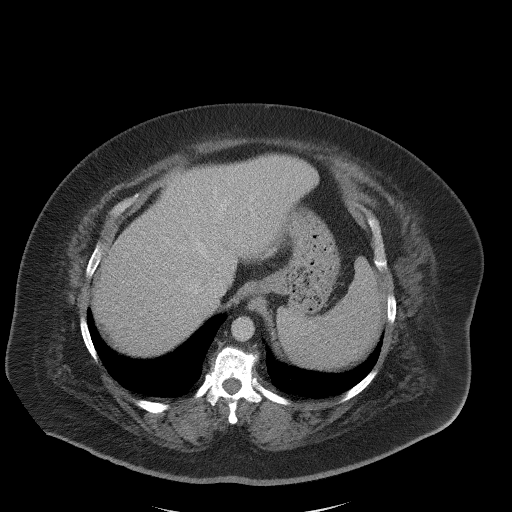

[Series 14: axial delay · axial · delayed · 0.94mm/px · z∈[-909,-549]mm · 6 of 102 slices shown, 11 images]
[im 15/102  soft-tissue]
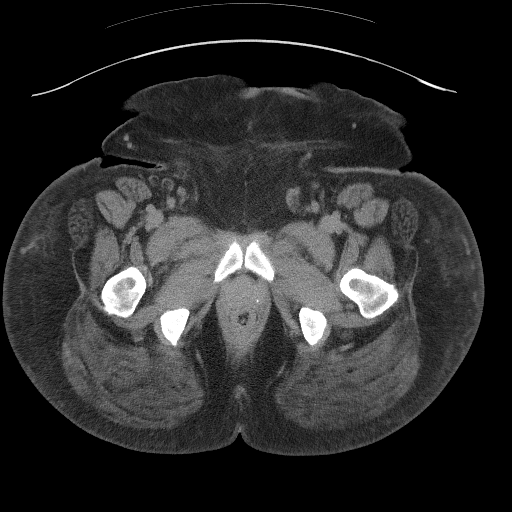
[im 15/102  bone]
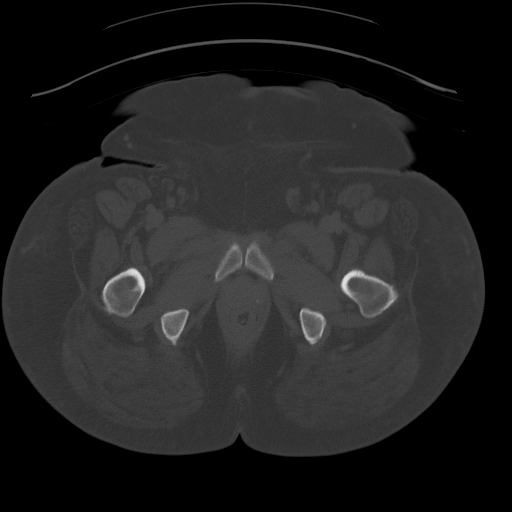
[im 29/102  soft-tissue]
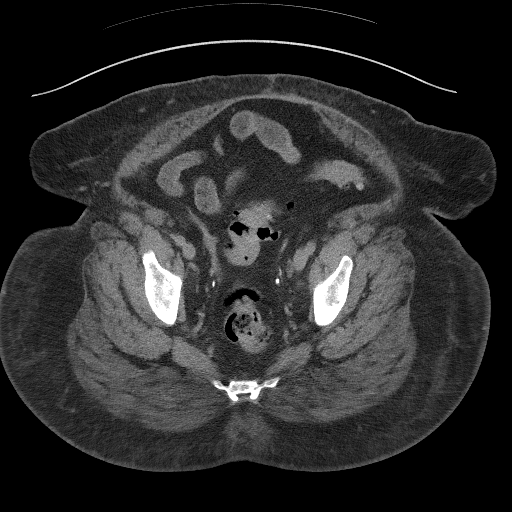
[im 44/102  soft-tissue]
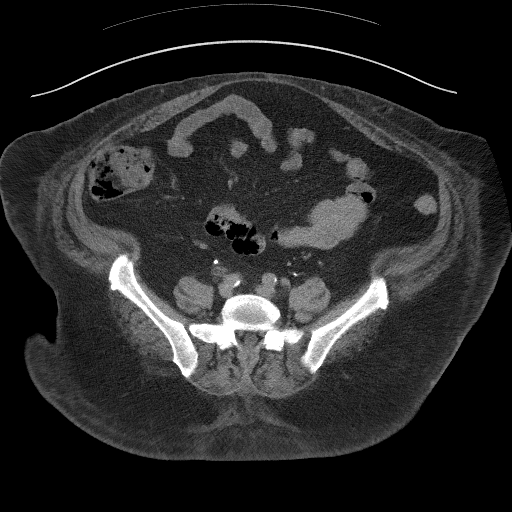
[im 44/102  lung]
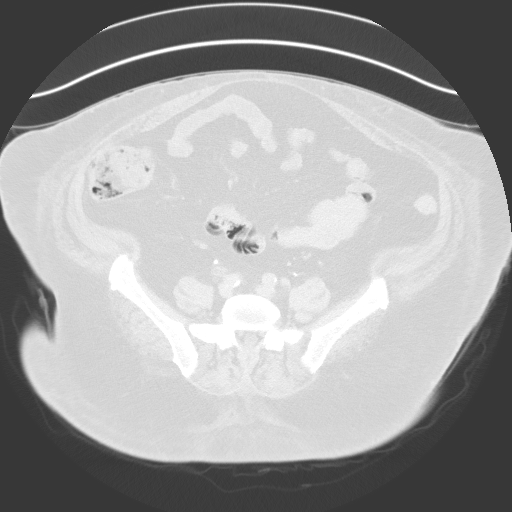
[im 58/102  soft-tissue]
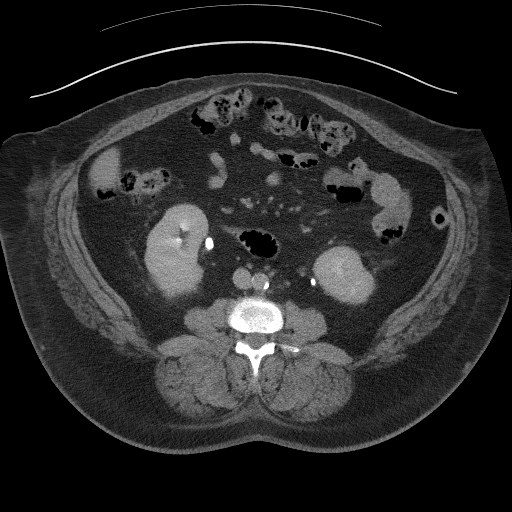
[im 58/102  lung]
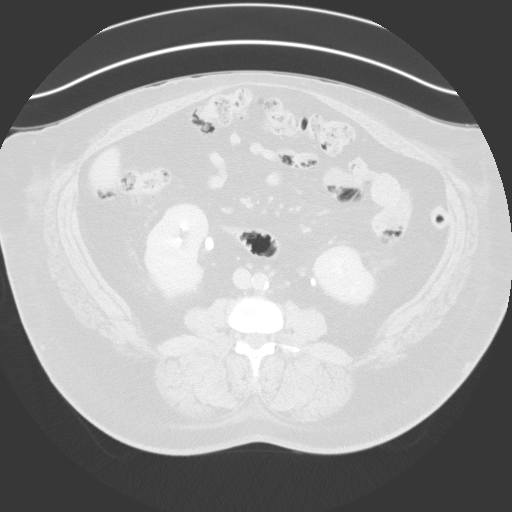
[im 73/102  soft-tissue]
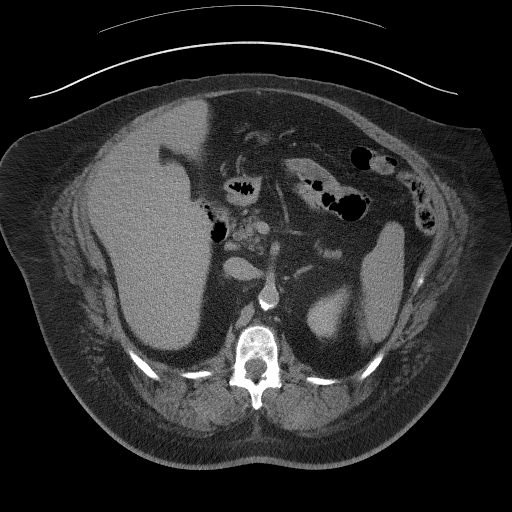
[im 73/102  lung]
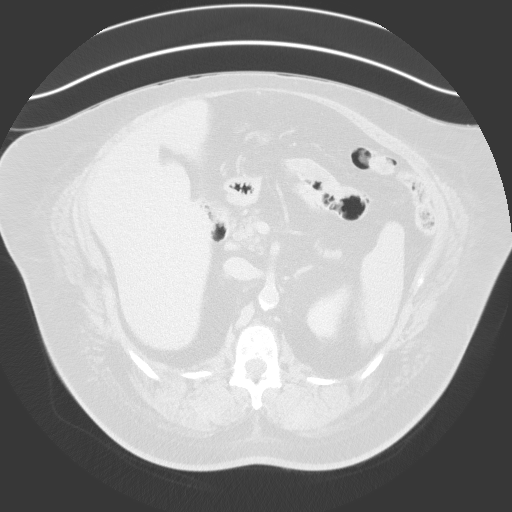
[im 87/102  soft-tissue]
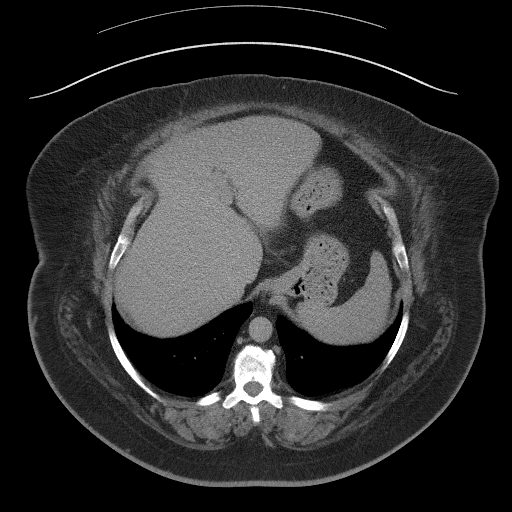
[im 87/102  lung]
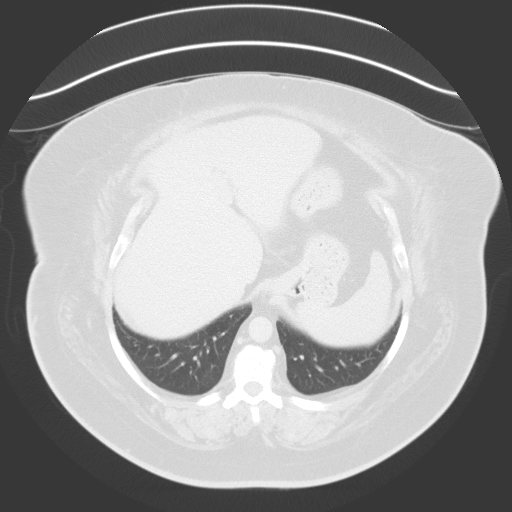

[12 of 32 positions shown; findings below may reference images not displayed]

FINDINGS: Lower chest: No significant pulmonary nodules or acute consolidative
airspace disease.

Hepatobiliary: Mild heterogeneous hepatic steatosis. No liver
masses. No definite liver surface irregularity. Cholecystectomy.
Bile ducts are stable and within normal post cholecystectomy limits,
with common bile duct diameter 7 mm.

Pancreas: Normal, with no mass or duct dilation.

Spleen: Normal size. No mass.

Adrenals/Urinary Tract: Normal adrenals. No renal stones. No
hydronephrosis. Normal caliber ureters, no ureteral stones. No renal
cortical masses. On delayed imaging, there is no urothelial wall
thickening and there are no filling defects in the opacified
portions of the bilateral collecting systems or ureters, noting
limited evaluation of the distal right pelvic ureter due to limited
opacification. No bladder stones, wall thickening, masses or
diverticula.

Stomach/Bowel: Grossly normal stomach. Normal caliber small bowel
with no small bowel wall thickening. Appendix not discretely
visualized. No pericecal inflammatory changes. Moderate diffuse
colonic diverticulosis, most prominent in the sigmoid colon, with no
large bowel wall thickening or pericolonic fat stranding.

Vascular/Lymphatic: Atherosclerotic nonaneurysmal abdominal aorta.
Patent portal, splenic, hepatic and renal veins. No pathologically
enlarged lymph nodes in the abdomen or pelvis.

Reproductive: Status post hysterectomy, with no abnormal findings at
the vaginal cuff. No adnexal mass.

Other: No pneumoperitoneum, ascites or focal fluid collection.

Musculoskeletal: No aggressive appearing focal osseous lesions.
Moderate thoracolumbar spondylosis.
IMPRESSION: 1. No urolithiasis. No hydronephrosis. No renal cortical masses. No
evidence of urothelial lesions.
2. Mild heterogeneous hepatic steatosis.
3. Moderate colonic diverticulosis.
4.  Aortic Atherosclerosis ([id number]-CFC.C).

## 2018-05-28 ENCOUNTER — Ambulatory Visit
Admission: RE | Admit: 2018-05-28 | Discharge: 2018-05-28 | Disposition: A | Discharge status: Home / Self Care | Payer: Medicare Other | Source: Ambulatory Visit | Attending: Family Medicine | Admitting: Family Medicine

## 2018-05-28 DIAGNOSIS — Z1231 Encounter for screening mammogram for malignant neoplasm of breast: Secondary | ICD-10-CM

## 2018-06-06 ENCOUNTER — Other Ambulatory Visit: Payer: Self-pay | Admitting: Family Medicine

## 2018-06-06 DIAGNOSIS — N6310 Unspecified lump in the right breast, unspecified quadrant: Secondary | ICD-10-CM

## 2018-06-06 DIAGNOSIS — R928 Other abnormal and inconclusive findings on diagnostic imaging of breast: Secondary | ICD-10-CM

## 2018-06-11 ENCOUNTER — Other Ambulatory Visit: Payer: Medicare Other

## 2018-06-11 ENCOUNTER — Ambulatory Visit: Payer: Medicare Other

## 2018-06-13 ENCOUNTER — Ambulatory Visit: Payer: Medicare Other | Admitting: Podiatry

## 2018-06-25 ENCOUNTER — Ambulatory Visit: Admission: RE | Admit: 2018-06-25 | Payer: Medicare Other | Source: Ambulatory Visit

## 2018-06-25 ENCOUNTER — Other Ambulatory Visit: Payer: Medicare Other

## 2018-07-18 ENCOUNTER — Ambulatory Visit: Payer: Medicare Other | Admitting: Podiatry

## 2018-07-25 ENCOUNTER — Telehealth: Payer: Self-pay

## 2018-07-25 NOTE — Telephone Encounter (Signed)
   TELEPHONE CALL NOTE  This patient has been deemed a candidate for follow-up tele-health visit to limit community exposure during the Covid-19 pandemic. I spoke with the patient via phone to discuss instructions. The patient was advised to review the section on consent for treatment as well. The patient will receive a phone call 2-3 days prior to their E-Visit at which time consent will be verbally confirmed. A Virtual Office Visit appointment type has been scheduled for 07/29/2018 with [facility].  Gaylord Shih, [place] 07/25/2018 12:21 PM

## 2018-07-25 NOTE — Telephone Encounter (Signed)
TELEPHONE CALL NOTE  [name] [name] has been deemed a candidate for a follow-up tele-health visit to limit community exposure during the Covid-19 pandemic. I spoke with the patient via phone to ensure availability of phone/video source, confirm preferred email & phone number, discuss instructions and expectations, and review consent.   I reminded [name] [name] to be prepared with any vital sign and/or heart rhythm information that could potentially be obtained via home monitoring, at the time of her visit.  Finally, I reminded [name] [name] to expect an e-mail containing a link for their video-based visit approximately 15 minutes before her visit, or alternatively, a phone call at the time of her visit if her visit is planned to be a phone encounter.  Did the patient verbally consent to treatment as below? Yes   [name] [name], CMA 07/25/2018 12:21 PM  CONSENT FOR TELE-HEALTH VISIT - PLEASE REVIEW  I hereby voluntarily request, consent and authorize [facility] and its employed or contracted physicians, physician assistants, nurse practitioners or other licensed health care professionals (the Practitioner), to provide me with telemedicine health care services (the "Services") as deemed necessary by the treating Practitioner. I acknowledge and consent to receive the Services by the Practitioner via telemedicine. I understand that the telemedicine visit will involve communicating with the Practitioner through telephonic communication technology and the disclosure of certain medical information by electronic transmission. I acknowledge that I have been given the opportunity to request an in-person assessment or other available alternative prior to the telemedicine visit and am voluntarily participating in the telemedicine visit.  I understand that I have the right to withhold or withdraw my consent to the use of telemedicine in the course of my care at any time, without affecting my  right to future care or treatment, and that the Practitioner or I may terminate the telemedicine visit at any time. I understand that I have the right to inspect all information obtained and/or recorded in the course of the telemedicine visit and may receive copies of available information for a reasonable fee.  I understand that some of the potential risks of receiving the Services via telemedicine include:  [name] [name] Delay or interruption in medical evaluation due to technological equipment failure or disruption; . Information transmitted may not be sufficient (e.g. poor resolution of images) to allow for appropriate medical decision making by the Practitioner; and/or  . In rare instances, security protocols could fail, causing a breach of personal health information.  Furthermore, I acknowledge that it is my responsibility to provide information about my medical history, conditions and care that is complete and accurate to the best of my ability. I acknowledge that Practitioner's advice, recommendations, and/or decision may be based on factors not within their control, such as incomplete or inaccurate data provided by me or lack of visual representation. I understand that the practice of medicine is not an exact science and that Practitioner makes no warranties or guarantees regarding treatment outcomes. I acknowledge that I will receive a copy of this consent concurrently upon execution via email to the email address I last provided but may also request a printed copy by calling the office of [facility].    I understand that my insurance may be billed for this visit.   I have read or had this consent read to me. . I understand the contents of this consent, which adequately explains the benefits and risks of the Services being provided via telemedicine.  [name] [name]  I have been provided ample opportunity to ask questions regarding this consent and the Services and have had my questions answered to my  satisfaction. . I give my informed consent for the services to be provided through the use of telemedicine in my medical care  By participating in this telemedicine visit I agree to the above.

## 2018-07-29 ENCOUNTER — Telehealth: Payer: Medicare Other | Admitting: Family

## 2018-07-29 ENCOUNTER — Other Ambulatory Visit: Payer: Self-pay

## 2018-07-29 ENCOUNTER — Encounter: Payer: Self-pay | Admitting: Family

## 2018-07-29 ENCOUNTER — Ambulatory Visit: Payer: Medicare Other | Attending: Family | Admitting: Family

## 2018-07-29 DIAGNOSIS — E119 Type 2 diabetes mellitus without complications: Secondary | ICD-10-CM

## 2018-07-29 DIAGNOSIS — I10 Essential (primary) hypertension: Secondary | ICD-10-CM

## 2018-07-29 DIAGNOSIS — J410 Simple chronic bronchitis: Secondary | ICD-10-CM

## 2018-07-29 DIAGNOSIS — I5032 Chronic diastolic (congestive) heart failure: Secondary | ICD-10-CM

## 2018-07-29 NOTE — Patient Instructions (Addendum)
Resume weighing daily and call for an overnight weight gain of > 2 pounds or a weekly weight gain of >5 pounds. 

## 2018-07-29 NOTE — Progress Notes (Signed)
Virtual Visit via Telephone Note   Evaluation Performed:  Follow-up visit  This visit type was conducted due to national recommendations for restrictions regarding the COVID-19 Pandemic (e.g. social distancing).  This format is felt to be most appropriate for this patient at this time.  All issues noted in this document were discussed and addressed.  No physical exam was performed (except for noted visual exam findings with Video Visits).  Please refer to the patient's chart (MyChart message for video visits and phone note for telephone visits) for the patient's consent to telehealth for [facility]  Date:  07/29/2018   ID:  Kreg Shropshire, DOB 09-Dec-1955, MRN [id number]  Patient Location:  [place] [address]   Provider location:   [facility] [address], [place] [address]  PCP:  Denton Lank, MD  Cardiologist: Bartholome Bill, MD Electrophysiologist:  None   Chief Complaint:  Shortness of breath  History of Present Illness:    [name] [name] is a 63 y.o. female who presents via audio/video conferencing for a telehealth visit today.  Patient verified DOB and address.  The patient does not have symptoms concerning for COVID-19 infection (fever, chills, cough, or new SHORTNESS OF BREATH).   Patient reports moderate shortness of breath upon minimal exertion. She describes this as chronic in nature having been present for several years. She has associated light-headedness, cough and fatigue along with this. She denies any swelling in her legs/ abdomen, palpitations, chest pain or fever. Unsure of what her weight is because she hasn't weighed yet today but says that it's been "good".   Prior CV studies:   The following studies were reviewed today:  Echo report from 05/21/16 reviewed and showed an EF of 50%.  Past Medical History:  Diagnosis Date  . Allergic rhinitis   . Allergy   . Asthma   . Back pain   . CHF  (congestive heart failure) ([place])   . COPD (chronic obstructive pulmonary disease) ([place])   . Degenerative joint disease of knee, left   . Depression   . Diabetes mellitus without complication ([place])   . Edema   . Elevated lipids   . GERD (gastroesophageal reflux disease)   . Hidradenitis   . History of colonic polyps   . Hypertension   . Hypothyroidism   . IBS (irritable bowel syndrome)   . Insomnia   . Obesity   . Oxygen dependent   . Pneumonia   . PVD (peripheral vascular disease) ([place])   . Sinusitis, chronic   . Sleep apnea   . Thyroid disease   . Vaginitis, atrophic   . Vertigo   . Vitamin D deficiency    Past Surgical History:  Procedure Laterality Date  . ABDOMINAL HYSTERECTOMY    . AXILLARY HIDRADENITIS EXCISION    . BREAST EXCISIONAL BIOPSY Bilateral 82/9937   RUPTURED FOLLICULAR CYSTS WITH ABSCESSES AND SCARRING, CONSISTENT WITH HIDRADENITIS SUPPURATIVA.   [name] CESAREAN SECTION     x 2  . CHOLECYSTECTOMY    . HEEL SPUR EXCISION N/A   . HYDRADENITIS EXCISION Right 12/31/2015   Procedure: EXCISION HIDRADENITIS AXILLA;  Surgeon: Clayburn Pert, MD;  Location: [facility] ORS;  Service: General;  Laterality: Right;  . KNEE SURGERY Left    1998  . TONSILLECTOMY       Current Meds  Medication Sig  . albuterol (PROVENTIL HFA;VENTOLIN HFA) 108 (90 Base) MCG/ACT inhaler Inhale 2 puffs  into the lungs every 6 (six) hours as needed for wheezing or shortness of breath.  [name] albuterol (PROVENTIL) (2.5 MG/3ML) 0.083% nebulizer solution Take 2.5 mg by nebulization every 6 (six) hours as needed for wheezing or shortness of breath.   [name] aspirin EC 81 MG tablet Take 81 mg by mouth daily.  [name] atorvastatin (LIPITOR) 20 MG tablet Take 20 mg by mouth at bedtime.   . benzonatate (TESSALON) 100 MG capsule Take 100 mg by mouth 3 (three) times daily as needed for cough.  [name] BIOTIN PO Take 500 mcg by mouth daily.  . budesonide-formoterol (SYMBICORT) 160-4.5 MCG/ACT inhaler Inhale 2 puffs into the  lungs 2 (two) times daily.  . clindamycin (CLINDAGEL) 1 % gel Apply 1 application topically 2 (two) times daily.  . clobetasol (TEMOVATE) 0.05 % external solution   . DALIRESP 500 MCG TABS tablet Take 500 mcg by mouth daily.   [name] doxepin (SINEQUAN) 50 MG capsule Take 50 mg by mouth at bedtime.  [name] doxycycline (DORYX) 100 MG EC tablet Take 100 mg by mouth 2 (two) times daily.  [name] doxycycline (PERIOSTAT) 20 MG tablet   . Dulaglutide (TRULICITY) 1.63 AG/5.3MI SOPN Inject into the skin once a week.  . DULoxetine (CYMBALTA) 20 MG capsule Take 20 mg by mouth daily.  . fluocinonide (LIDEX) 0.05 % external solution Apply 1 application topically 2 (two) times daily.  [name] gabapentin (NEURONTIN) 300 MG capsule Take 300 mg by mouth 2 (two) times daily.   [name] glucose blood test strip TEST three times a day  . HUMULIN R U-500 KWIKPEN 500 UNIT/ML injection Inject 120 Units into the skin 2 (two) times daily with a meal.   . ibuprofen (ADVIL,MOTRIN) 800 MG tablet Take 1 tablet (800 mg total) by mouth every 8 (eight) hours as needed for mild pain or moderate pain (with food).  [name] ketoconazole (NIZORAL) 2 % shampoo Apply 1 application topically 2 (two) times a week.  . Lancets Misc. (ACCU-CHEK FASTCLIX LANCET) KIT   . levothyroxine (SYNTHROID, LEVOTHROID) 200 MCG tablet Take 200 mcg by mouth daily before breakfast.   . LUTEIN PO Take 1 tablet by mouth daily.  . meclizine (ANTIVERT) 25 MG tablet Take 25 mg by mouth 3 (three) times daily as needed for dizziness.  . Melatonin 5 MG CAPS Take 2 capsules by mouth daily.  . mometasone (NASONEX) 50 MCG/ACT nasal spray Place 2 sprays into both nostrils daily.   . montelukast (SINGULAIR) 10 MG tablet Take 10 mg by mouth at bedtime.   . naphazoline-glycerin (CLEAR EYES) 0.012-0.2 % SOLN Place 1-2 drops into both eyes 4 (four) times daily as needed for irritation.  [name] olopatadine (PATANOL) 0.1 % ophthalmic solution Place 1 drop into both eyes 2 (two) times daily.  [name] omeprazole  (PRILOSEC) 40 MG capsule Take 40 mg by mouth daily.  . potassium citrate (UROCIT-K) 10 MEQ (1080 MG) SR tablet Take 1 tablet (10 mEq total) by mouth every morning.  [name] spironolactone (ALDACTONE) 50 MG tablet Take 100 mg by mouth daily.   [name] torsemide (DEMADEX) 20 MG tablet TAKE  1 TABLETS BY MOUTH daily  . traZODone (DESYREL) 50 MG tablet Take 50-100 mg by mouth at bedtime as needed for sleep.   . [DISCONTINUED] ketoconazole (NIZORAL) 2 % cream      Allergies:   Codeine   Social History   Tobacco Use  . Smoking status: Former Smoker    Last attempt to quit: 12/18/1993    Years since quitting:  24.6  . Smokeless tobacco: Never Used  Substance Use Topics  . Alcohol use: No  . Drug use: No     Family Hx: The patient's family history includes Breast cancer in her paternal grandmother; COPD in her mother; Heart disease in her father; Hypertension in her father; Rashes / Skin problems in her father. There is no history of Kidney cancer or Bladder Cancer.  ROS:   Please see the history of present illness.     All other systems reviewed and are negative.   Labs/Other Tests and Data Reviewed:    Recent Labs: No results found for requested labs within last 8760 hours.   Recent Lipid Panel Lab Results  Component Value Date/Time   CHOL 107 09/18/2012 06:22 AM   TRIG 282 (H) 09/18/2012 06:22 AM   HDL 29 (L) 09/18/2012 06:22 AM   LDLCALC 22 09/18/2012 06:22 AM    Wt Readings from Last 3 Encounters:  01/28/18 274 lb 8 oz (124.5 kg)  10/01/17 270 lb 4 oz (122.6 kg)  06/25/17 272 lb (123.4 kg)     Exam:    Vital Signs:  There were no vitals taken for this visit.   Well nourished, well developed female in no  acute distress.   ASSESSMENT & PLAN:    1.Chronic heart failure with preserved ejection fraction- - NYHA class III - euvolemic today based on patient's description of symptoms - not weighing herself daily. Encouraged her to resume weighing daily so that she can call for  an overnight weight gain of 2 lbs or a weekly weight gain of 5 lbs - not adding salt and is using a salt substitute. Using meals on wheels for food; reminded her to follow a 2000 mg sodium diet - drinking about 48 ounces of fluid daily - PRN follow-up with cardiologist [name]); last seen 12/28/15 - encouraged her to increase her activity - wearing support socks daily  2: HTN- - not checking her glucose daily - follows with PCP at [facility] & had telemedicine visit with them 07/26/2018 (per patient's report) - BMP from 06/25/17 reviewed and showed sodium 136, potassium 3.5 and GFR >60  3: Diabetes- - fasting glucose at home yesterday was 82 - A1C on 05/21/16 was 7.0% - saw endocrinologist ([place]) 09/12/17   4: COPD- - continue inhalers and Daliresp - wears oxygen at 2L at bedtime - saw pulmonologist [name]) 09/26/16    COVID-19 Education: The signs and symptoms of COVID-19 were discussed with the patient and how to seek care for testing (follow up with PCP or arrange E-visit).  The importance of social distancing was discussed today.  Patient Risk:   After full review of this patients clinical status, I feel that they are at least moderate risk at this time.  Time:   Today, I have spent 8 minutes with the patient with telehealth technology discussing weight, diet and symptoms to report.     Medication Adjustments/Labs and Tests Ordered: Current medicines are reviewed at length with the patient today.  Concerns regarding medicines are outlined above.   Tests Ordered: No orders of the defined types were placed in this encounter.  Medication Changes: No orders of the defined types were placed in this encounter.   Disposition:  Follow-up in 4 months or sooner for any questions/problems before then.   Signed, Alisa Graff, FNP  07/29/2018 1:02 PM    [facility]

## 2018-08-05 ENCOUNTER — Other Ambulatory Visit: Payer: Self-pay

## 2018-08-05 ENCOUNTER — Ambulatory Visit
Admission: RE | Admit: 2018-08-05 | Discharge: 2018-08-05 | Disposition: A | Discharge status: Home / Self Care | Payer: Medicare Other | Source: Ambulatory Visit | Attending: Family Medicine | Admitting: Family Medicine

## 2018-08-05 DIAGNOSIS — N6310 Unspecified lump in the right breast, unspecified quadrant: Secondary | ICD-10-CM | POA: Diagnosis present

## 2018-08-05 DIAGNOSIS — R928 Other abnormal and inconclusive findings on diagnostic imaging of breast: Secondary | ICD-10-CM | POA: Insufficient documentation

## 2018-08-08 ENCOUNTER — Encounter: Payer: Self-pay | Admitting: Podiatry

## 2018-08-08 ENCOUNTER — Other Ambulatory Visit: Payer: Self-pay

## 2018-08-08 ENCOUNTER — Ambulatory Visit (INDEPENDENT_AMBULATORY_CARE_PROVIDER_SITE_OTHER): Payer: Medicare Other | Admitting: Podiatry

## 2018-08-08 VITALS — Temp 96.5°F

## 2018-08-08 DIAGNOSIS — E0842 Diabetes mellitus due to underlying condition with diabetic polyneuropathy: Secondary | ICD-10-CM

## 2018-08-08 NOTE — Progress Notes (Signed)
This patient presents to the office with chief complaint of  diabetic feet.  This patient  says there  is  no pain and discomfort in her feet.  This patient says she needs another pair of diabetic shoes.  Patient has no history of infection or drainage from both feet.   This patient presents  to the office today for treatment of the  long nails and a foot evaluation due to history of  diabetes.  General Appearance  Alert, conversant and in no acute stress.  Vascular  Dorsalis pedis  are palpable  bilaterally. Posterior tibial are absent  B/L. Capillary return is within normal limits  bilaterally. Temperature is within normal limits  bilaterally.  Neurologic  Senn-Weinstein monofilament wire test absent  bilaterally. Muscle power within normal limits bilaterally.  Nails Normal nails with no evidence of infection or drainage.  Orthopedic  No limitations of motion of motion feet .  No crepitus or effusions noted.  No bony pathology or digital deformities noted.  Hallux malleus  B/L.  Skin  normotropic skin with no porokeratosis noted bilaterally.  No signs of infections or ulcers noted.     Onychomycosis  Diabetes with neuropathy.  IE  Debride nails x 10.  A diabetic foot exam was performed and there is no evidence of any vascular  pathology.  LOPS absent.  Patient qualifies for diabetic shoes due to DPN and hallux malleus  B/L.  RTC to see Rick.   Gardiner Barefoot DPM

## 2018-08-21 ENCOUNTER — Other Ambulatory Visit: Payer: Self-pay

## 2018-08-21 ENCOUNTER — Ambulatory Visit: Payer: Medicare Other | Admitting: Orthotics

## 2018-08-21 DIAGNOSIS — E0842 Diabetes mellitus due to underlying condition with diabetic polyneuropathy: Secondary | ICD-10-CM

## 2018-08-21 DIAGNOSIS — M2040 Other hammer toe(s) (acquired), unspecified foot: Secondary | ICD-10-CM

## 2018-08-21 NOTE — Progress Notes (Signed)

## 2018-10-02 ENCOUNTER — Ambulatory Visit: Payer: Medicare Other | Admitting: Orthotics

## 2018-10-02 ENCOUNTER — Other Ambulatory Visit: Payer: Self-pay

## 2018-10-02 DIAGNOSIS — E0842 Diabetes mellitus due to underlying condition with diabetic polyneuropathy: Secondary | ICD-10-CM

## 2018-10-02 DIAGNOSIS — M2040 Other hammer toe(s) (acquired), unspecified foot: Secondary | ICD-10-CM

## 2018-10-02 DIAGNOSIS — M6788 Other specified disorders of synovium and tendon, other site: Secondary | ICD-10-CM

## 2018-10-07 NOTE — Progress Notes (Signed)
Reordered shoes

## 2018-10-14 ENCOUNTER — Telehealth: Payer: Self-pay | Admitting: Orthotics

## 2018-10-17 NOTE — Telephone Encounter (Signed)
Called to advise her shoes were ordered.

## 2018-11-04 NOTE — Progress Notes (Signed)
Patient ID: [name] [name], female    DOB: 12-Oct-1955, 63 y.o.   MRN: [id number]  HPI  [name] [name] is a 63 y/o female with a history of vitamin D deficiency, obstructive sleep apnea, GERD, pneumonia, PVD, DM, depression, DJD, COPD with oxygen, allergies, past tobacco use and chronic heart failure.   Last echo was done on 05/21/16 and showed an EF of 50% along with trivial MR/TR/PR. EF has declined slightly from June 2017.  Has not been admitted or been in the ED in the last 6 months.   She presents today for her follow-up visit with a chief complaint of moderate shortness of breath upon minimal exertion. She describes this as chronic in nature having been present for several years. She has associated fatigue, light-headedness and pedal edema along with this. She denies any difficulty sleeping, abdominal distention, palpitations, chest pain, wheezing, cough or weight gain.   Past Medical History:  Diagnosis Date  . Allergic rhinitis   . Allergy   . Asthma   . Back pain   . CHF (congestive heart failure) ([place])   . COPD (chronic obstructive pulmonary disease) ([place])   . Degenerative joint disease of knee, left   . Depression   . Diabetes mellitus without complication ([place])   . Edema   . Elevated lipids   . GERD (gastroesophageal reflux disease)   . Hidradenitis   . History of colonic polyps   . Hypertension   . Hypothyroidism   . IBS (irritable bowel syndrome)   . Insomnia   . Obesity   . Oxygen dependent   . Pneumonia   . PVD (peripheral vascular disease) ([place])   . Sinusitis, chronic   . Sleep apnea   . Thyroid disease   . Vaginitis, atrophic   . Vertigo   . Vitamin D deficiency    Past Surgical History:  Procedure Laterality Date  . ABDOMINAL HYSTERECTOMY    . AXILLARY HIDRADENITIS EXCISION    . BREAST EXCISIONAL BIOPSY Bilateral [id number]/[id number]   RUPTURED FOLLICULAR CYSTS WITH ABSCESSES AND SCARRING, CONSISTENT WITH HIDRADENITIS SUPPURATIVA.   [name] CESAREAN SECTION     x 2  .  CHOLECYSTECTOMY    . HEEL SPUR EXCISION N/A   . HYDRADENITIS EXCISION Right 12/31/2015   Procedure: EXCISION HIDRADENITIS AXILLA;  Surgeon: Clayburn Pert, MD;  Location: [facility] ORS;  Service: General;  Laterality: Right;  . KNEE SURGERY Left    1998  . TONSILLECTOMY     Family History  Problem Relation Age of Onset  . Rashes / Skin problems Father   . Hypertension Father   . Heart disease Father   . Breast cancer Paternal Grandmother   . COPD Mother   . Kidney cancer Neg Hx   . Bladder Cancer Neg Hx    Social History   Tobacco Use  . Smoking status: Former Smoker    Quit date: 12/18/1993    Years since quitting: 24.8  . Smokeless tobacco: Never Used  Substance Use Topics  . Alcohol use: No   Allergies  Allergen Reactions  . Codeine Itching   Prior to Admission medications   Medication Sig Start Date End Date Taking? Authorizing Provider  albuterol (PROVENTIL HFA;VENTOLIN HFA) 108 (90 Base) MCG/ACT inhaler Inhale 2 puffs into the lungs every 6 (six) hours as needed for wheezing or shortness of breath.   Yes [provider]  albuterol (PROVENTIL) (2.5 MG/3ML) 0.083% nebulizer solution Take 2.5 mg by nebulization every 6 (six) hours as needed  for wheezing or shortness of breath.    Yes [provider]  aspirin EC 81 MG tablet Take 81 mg by mouth daily.   Yes [provider]  atorvastatin (LIPITOR) 20 MG tablet Take 20 mg by mouth at bedtime.    Yes [provider]  BIOTIN PO Take 500 mcg by mouth daily.   Yes [provider]  budesonide-formoterol (SYMBICORT) 160-4.5 MCG/ACT inhaler Inhale 2 puffs into the lungs 2 (two) times daily.   Yes [provider]  clindamycin (CLINDAGEL) 1 % gel Apply 1 application topically 2 (two) times daily. 04/05/17  Yes Clayburn Pert, MD  clobetasol (TEMOVATE) 0.05 % external solution  03/21/18  Yes [provider]  DALIRESP 500 MCG TABS tablet Take 500 mcg by mouth daily.  03/19/17   Yes [provider]  doxepin (SINEQUAN) 50 MG capsule Take 50 mg by mouth at bedtime.   Yes [provider]  doxycycline (MONODOX) 100 MG capsule  06/19/18  Yes [provider]  Dulaglutide (TRULICITY) 7.49 SW/9.6PR SOPN Inject into the skin once a week.   Yes [provider]  DULoxetine (CYMBALTA) 20 MG capsule Take 20 mg by mouth daily.   Yes [provider]  fluocinonide (LIDEX) 0.05 % external solution Apply 1 application topically 2 (two) times daily.   Yes [provider]  fluticasone Asencion Islam) 50 MCG/ACT nasal spray  06/24/18  Yes [provider]  gabapentin (NEURONTIN) 300 MG capsule Take 300 mg by mouth 2 (two) times daily.  03/25/17  Yes [provider]  glucose blood test strip TEST three times a day 12/30/15  Yes [provider]  HUMULIN R U-500 KWIKPEN 500 UNIT/ML injection Inject 120 Units into the skin 2 (two) times daily with a meal.    Yes [provider]  ibuprofen (ADVIL,MOTRIN) 800 MG tablet Take 1 tablet (800 mg total) by mouth every 8 (eight) hours as needed for mild pain or moderate pain (with food). 12/21/16  Yes Eula Listen, MD  ketoconazole (NIZORAL) 2 % shampoo Apply 1 application topically 2 (two) times a week.   Yes [provider]  Lancets Misc. (ACCU-CHEK FASTCLIX LANCET) KIT  12/27/15  Yes [provider]  levothyroxine (SYNTHROID, LEVOTHROID) 200 MCG tablet Take 200 mcg by mouth daily before breakfast.    Yes [provider]  LUTEIN PO Take 1 tablet by mouth daily.   Yes [provider]  meclizine (ANTIVERT) 25 MG tablet Take 25 mg by mouth 3 (three) times daily as needed for dizziness.   Yes [provider]  Melatonin 5 MG CAPS Take 2 capsules by mouth daily.   Yes [provider]  mometasone (NASONEX) 50 MCG/ACT nasal spray Place 2 sprays into both nostrils daily.    Yes [provider]  montelukast (SINGULAIR)  10 MG tablet Take 10 mg by mouth at bedtime.    Yes [provider]  nystatin (MYCOSTATIN) 100000 UNIT/ML suspension  06/04/18  Yes [provider]  olopatadine (PATANOL) 0.1 % ophthalmic solution Place 1 drop into both eyes 2 (two) times daily.   Yes [provider]  omeprazole (PRILOSEC) 40 MG capsule Take 40 mg by mouth daily.   Yes [provider]  potassium citrate (UROCIT-K) 10 MEQ (1080 MG) SR tablet Take 1 tablet (10 mEq total) by mouth every morning. 02/28/18  Yes Neng Albee, Otila Kluver A, FNP  spironolactone (ALDACTONE) 50 MG tablet Take 100 mg by mouth daily.    Yes [provider]  torsemide (DEMADEX) 20 MG tablet TAKE  1 TABLETS BY MOUTH daily 05/02/18  Yes Darylene Price A, FNP  traZODone (DESYREL) 50 MG tablet Take 50-100 mg by mouth at bedtime as needed for sleep.    Yes [provider]    Review of Systems  Constitutional: Positive for fatigue. Negative for appetite change.  HENT: Negative for congestion, rhinorrhea and sore throat.   Eyes: Negative.   Respiratory: Positive for shortness of breath (with minimal exertion). Negative for cough, chest tightness and wheezing.   Cardiovascular: Positive for leg swelling (left side). Negative for chest pain and palpitations.  Gastrointestinal: Negative for abdominal distention and abdominal pain.  Endocrine: Negative.   Genitourinary: Negative.   Musculoskeletal: Negative for back pain and neck pain.  Skin: Negative.   Allergic/Immunologic: Negative.   Neurological: Positive for light-headedness (intermittent). Negative for dizziness.  Hematological: Negative for adenopathy. Does not bruise/bleed easily.  Psychiatric/Behavioral: Negative for dysphoric mood and sleep disturbance. The patient is not nervous/anxious.    Vitals:   11/05/18 1331  BP: (!) 123/55  Pulse: 94  Resp: (!) 22  Temp: 98.2 F (36.8 C)  TempSrc: Oral  SpO2: 96%  Weight: 270 lb (122.5 kg)  Height: 5' 2"  (1.575  m)   Wt Readings from Last 3 Encounters:  11/05/18 270 lb (122.5 kg)  01/28/18 274 lb 8 oz (124.5 kg)  10/01/17 270 lb 4 oz (122.6 kg)   Lab Results  Component Value Date   CREATININE 0.85 06/25/2017   CREATININE 0.89 12/21/2016   CREATININE 0.82 11/15/2016    Physical Exam  Constitutional: She is oriented to person, place, and time. She appears well-developed and well-nourished.  HENT:  Head: Normocephalic and atraumatic.  Neck: Normal range of motion. Neck supple. No JVD present.  Cardiovascular: Normal rate and regular rhythm.  Pulmonary/Chest: Effort normal. She has no wheezes. She has no rales.  Abdominal: Soft. She exhibits no distension. There is no abdominal tenderness.  Musculoskeletal:        General: Edema (1+ pitting edema in left lower leg) present. No tenderness.  Neurological: She is alert and oriented to person, place, and time.  Skin: Skin is warm and dry.  Psychiatric: She has a normal mood and affect. Her behavior is normal. Thought content normal.  Nursing note and vitals reviewed.    Assessment & Plan:  1: Chronic heart failure with preserved ejection fraction- - NYHA class III - euvolemic today - not weighing herself daily. Encouraged her to resume weighing daily so that she can call for an overnight weight gain of 2 lbs or a weekly weight gain of 5 lbs - weight down 4 pounds since her last visit here 9 months ago - not adding salt and is using a salt substitute. Using meals on wheels for food; reminded her to follow a 2000 mg sodium diet - PRN follow-up with cardiologist [name]); last seen 12/28/15 - encouraged her to increase her activity - encouraged her to elevate her legs when sitting for long periods of time; she says edema improves in the morning after being in the bed  2: HTN- - BP looks good today - follows with PCP at [facility] & was recently seen 09/11/2018; will request lab results - BMP from 06/25/17 reviewed and showed  sodium 136, potassium 3.5 and GFR >60  3: Diabetes- - nonfasting glucose in the office was 114 - A1C on 05/21/16 was 7.0% - saw endocrinologist ([place]) 09/12/17  4: COPD- - continue inhalers and Daliresp - wears oxygen at 2L when at home - saw pulmonologist [name]) 09/26/16  Medication list was reviewed.  Return in 6 months or sooner for any questions/problems before then.

## 2018-11-05 ENCOUNTER — Ambulatory Visit: Payer: Medicare Other | Attending: Family | Admitting: Family

## 2018-11-05 ENCOUNTER — Other Ambulatory Visit: Payer: Self-pay

## 2018-11-05 ENCOUNTER — Encounter: Payer: Self-pay | Admitting: Family

## 2018-11-05 VITALS — BP 123/55 | HR 94 | Temp 98.2°F | Resp 22 | Ht 62.0 in | Wt 270.0 lb

## 2018-11-05 DIAGNOSIS — J449 Chronic obstructive pulmonary disease, unspecified: Secondary | ICD-10-CM | POA: Diagnosis not present

## 2018-11-05 DIAGNOSIS — G4733 Obstructive sleep apnea (adult) (pediatric): Secondary | ICD-10-CM | POA: Insufficient documentation

## 2018-11-05 DIAGNOSIS — Z79890 Hormone replacement therapy (postmenopausal): Secondary | ICD-10-CM | POA: Insufficient documentation

## 2018-11-05 DIAGNOSIS — E1151 Type 2 diabetes mellitus with diabetic peripheral angiopathy without gangrene: Secondary | ICD-10-CM | POA: Insufficient documentation

## 2018-11-05 DIAGNOSIS — Z8249 Family history of ischemic heart disease and other diseases of the circulatory system: Secondary | ICD-10-CM | POA: Insufficient documentation

## 2018-11-05 DIAGNOSIS — J410 Simple chronic bronchitis: Secondary | ICD-10-CM

## 2018-11-05 DIAGNOSIS — Z7982 Long term (current) use of aspirin: Secondary | ICD-10-CM | POA: Insufficient documentation

## 2018-11-05 DIAGNOSIS — Z7951 Long term (current) use of inhaled steroids: Secondary | ICD-10-CM | POA: Diagnosis not present

## 2018-11-05 DIAGNOSIS — F329 Major depressive disorder, single episode, unspecified: Secondary | ICD-10-CM | POA: Diagnosis not present

## 2018-11-05 DIAGNOSIS — Z794 Long term (current) use of insulin: Secondary | ICD-10-CM | POA: Insufficient documentation

## 2018-11-05 DIAGNOSIS — I110 Hypertensive heart disease with heart failure: Secondary | ICD-10-CM | POA: Insufficient documentation

## 2018-11-05 DIAGNOSIS — Z885 Allergy status to narcotic agent status: Secondary | ICD-10-CM | POA: Diagnosis not present

## 2018-11-05 DIAGNOSIS — K219 Gastro-esophageal reflux disease without esophagitis: Secondary | ICD-10-CM | POA: Insufficient documentation

## 2018-11-05 DIAGNOSIS — Z87891 Personal history of nicotine dependence: Secondary | ICD-10-CM | POA: Diagnosis not present

## 2018-11-05 DIAGNOSIS — I10 Essential (primary) hypertension: Secondary | ICD-10-CM

## 2018-11-05 DIAGNOSIS — I5032 Chronic diastolic (congestive) heart failure: Secondary | ICD-10-CM | POA: Insufficient documentation

## 2018-11-05 DIAGNOSIS — E119 Type 2 diabetes mellitus without complications: Secondary | ICD-10-CM

## 2018-11-05 DIAGNOSIS — Z79899 Other long term (current) drug therapy: Secondary | ICD-10-CM | POA: Diagnosis not present

## 2018-11-05 LAB — GLUCOSE, CAPILLARY: Glucose-Capillary: 114 mg/dL — ABNORMAL HIGH (ref 70–99)

## 2018-11-05 NOTE — Patient Instructions (Signed)
Continue weighing daily and call for an overnight weight gain of > 2 pounds or a weekly weight gain of >5 pounds. 

## 2018-12-25 ENCOUNTER — Ambulatory Visit (INDEPENDENT_AMBULATORY_CARE_PROVIDER_SITE_OTHER): Payer: Medicare Other | Admitting: Orthotics

## 2018-12-25 ENCOUNTER — Other Ambulatory Visit: Payer: Self-pay

## 2018-12-25 DIAGNOSIS — M6788 Other specified disorders of synovium and tendon, other site: Secondary | ICD-10-CM

## 2018-12-25 DIAGNOSIS — M2041 Other hammer toe(s) (acquired), right foot: Secondary | ICD-10-CM

## 2018-12-25 DIAGNOSIS — E0842 Diabetes mellitus due to underlying condition with diabetic polyneuropathy: Secondary | ICD-10-CM

## 2018-12-25 DIAGNOSIS — M2040 Other hammer toe(s) (acquired), unspecified foot: Secondary | ICD-10-CM

## 2018-12-26 NOTE — Progress Notes (Signed)

## 2019-02-18 ENCOUNTER — Telehealth: Payer: Self-pay | Admitting: Podiatry

## 2019-02-18 NOTE — Telephone Encounter (Signed)
Pt called asking if we knew of another place she could get diabetic shoes and not have the 20% coinsurance from medicare. I explained that we did not know of another shoe place that dispensed diabetic shoes. I advised  her to call medicaid to see who they could recommend. She stated she use to get them and not pay anything.

## 2019-02-19 ENCOUNTER — Other Ambulatory Visit: Payer: Self-pay

## 2019-02-19 ENCOUNTER — Ambulatory Visit: Payer: Medicare Other | Attending: Physician Assistant | Admitting: Physical Therapy

## 2019-02-19 ENCOUNTER — Encounter: Payer: Self-pay | Admitting: Physical Therapy

## 2019-02-19 DIAGNOSIS — M6281 Muscle weakness (generalized): Secondary | ICD-10-CM | POA: Diagnosis present

## 2019-02-19 DIAGNOSIS — R42 Dizziness and giddiness: Secondary | ICD-10-CM | POA: Diagnosis present

## 2019-02-19 DIAGNOSIS — Z9181 History of falling: Secondary | ICD-10-CM | POA: Insufficient documentation

## 2019-02-19 DIAGNOSIS — R262 Difficulty in walking, not elsewhere classified: Secondary | ICD-10-CM | POA: Insufficient documentation

## 2019-02-19 NOTE — Therapy (Signed)
[place] MAIN [facility] SERVICES [address] [place], [place], [address] Phone: [phone]   Fax:  [phone]  Physical Therapy Evaluation  Patient Details  Name: [name] [name] MRN: [id number] Date of Birth: [birth year] No data recorded  Encounter Date: 02/19/2019    Past Medical History:  Diagnosis Date   Allergic rhinitis    Allergy    Asthma    Back pain    CHF (congestive heart failure) (HCC)    COPD (chronic obstructive pulmonary disease) ([place])    Degenerative joint disease of knee, left    Depression    Diabetes mellitus without complication (HCC)    Edema    Elevated lipids    GERD (gastroesophageal reflux disease)    Hidradenitis    History of colonic polyps    Hypertension    Hypothyroidism    IBS (irritable bowel syndrome)    Insomnia    Obesity    Oxygen dependent    Pneumonia    PVD (peripheral vascular disease) (HCC)    Sinusitis, chronic    Sleep apnea    Thyroid disease    Vaginitis, atrophic    Vertigo    Vitamin D deficiency     Past Surgical History:  Procedure Laterality Date   ABDOMINAL HYSTERECTOMY     AXILLARY HIDRADENITIS EXCISION     BREAST EXCISIONAL BIOPSY Bilateral [id number]   RUPTURED FOLLICULAR CYSTS WITH ABSCESSES AND SCARRING, CONSISTENT WITH HIDRADENITIS SUPPURATIVA.    CESAREAN SECTION     x 2   CHOLECYSTECTOMY     HEEL SPUR EXCISION N/A    HYDRADENITIS EXCISION Right 12/31/2015   Procedure: EXCISION HIDRADENITIS AXILLA;  Surgeon: Clayburn Pert, MD;  Location: [facility] ORS;  Service: General;  Laterality: Right;   KNEE SURGERY Left    1998   TONSILLECTOMY      There were no vitals filed for this visit.    Subjective Assessment - 02/24/19 1653    Subjective  Patient states her dizziness symptoms have gotten worse this year and that she had a fall recently when she was looking up and trying to get something off of the top of the fridge. Patient  denied vertigo but reports dizziness in the clinic during evaluation.    Pertinent History  Patient reports that she has been having dizziness for on and off for 5 years, but states it has gotten worse over time. Patient reports recently she was reaching up for a box on top of a fridge and she had dizziness and she fell and struck her head on a cement floor. Denies having had a VNG test. Patient reports vertigo, unsteadiness, lightheadedness, and tinnitus in both ears at times. Patient reports the dizziness lasts until she sits down. Patient reports looking up, looking down, quick movements, head turns bring on her symptoms. Patient reports sitting down helps ease her symptoms. Patient reports she has slept in a recliner chair for years. Patient reports she gets dizziness daily. Patient states she has a rollator that she uses to walk to the front door and to get to the car. Patient has a another smaller rollator walker that she has in the car for community mobility, but states she uses the handicap Research officer, trade union for community mobility whenever available. Patient would like to be able to do housework like vacuuming, mopping floors. Patient states she tries to do housework, but it takes almost all day due to having to stop frequently due to dizziness symptoms.  Patient Stated Goals  to have decreased dizziness, to be able to do light housekeeping activities    Currently in Pain?  --   none stated        [facility] PT Assessment - 02/24/19 1656      Assessment   Medical Diagnosis  dizziness and giddiness    Referring Provider (PT)  Brett Fairy, PA    Prior Therapy  no prior vestibular therapy      Precautions   Precautions  Fall      Restrictions   Weight Bearing Restrictions  No      Balance Screen   Has the patient fallen in the past 6 months  Yes    How many times?  3 and reports numerous near misses in addition to the 3 falls    Has the patient had a decrease in activity level because of  a fear of falling?   Yes    Is the patient reluctant to leave their home because of a fear of falling?   No      Home Environment   Living Environment  Private residence    Living Arrangements  Alone    Type of [place] Access  Level entry    Home Layout  One level    [place]  Tar Heel - 4 wheels;Shower seat      Prior Function   Level of Independence  Independent with household mobility with device;Requires assistive device for independence      Cognition   Overall Cognitive Status  Within Functional Limits for tasks assessed      Sensation   Light Touch  Impaired by gross assessment    Proprioception  Impaired by gross assessment         VESTIBULAR AND BALANCE EVALUATION   HISTORY:  Subjective history of current problem: Patient reports that she has been having dizziness for on and off for 5 years, but states it has gotten worse over time. Patient reports recently she was reaching up for a box on top of a fridge and she had dizziness and she fell and struck her head on a cement floor. Denies having had a VNG test. Patient reports vertigo, unsteadiness, lightheadedness, and tinnitus in both ears at times. Patient reports the dizziness lasts until she sits down. Patient reports looking up, looking down, quick movements, head turns bring on her symptoms. Patient reports sitting down helps ease her symptoms. Patient reports she has slept in a recliner chair for years. Patient reports she gets dizziness daily. Patient states she has a rollator that she uses to walk to the front door and to get to the car. Patient has a another smaller rollator walker that she has in the car for community mobility, but states she uses the handicap Research officer, trade union for community mobility whenever available. Patient would like to be able to do housework like vacuuming, mopping floors. Patient states she tries to do housework, but it takes almost all day due to having to stop frequently due  to dizziness symptoms. Patient is independent with ADLs and has a shower chair.  Lives in an apartment with no stairs.   Symptom nature: motion provoked, spontaneous,  intermittent  Progression of symptoms: worse History of similar episodes: yes  Falls (yes/no): yes Number of falls in past 6 months: 3; patient reports she has had many near misses  Auditory complaints (tinnitus, pain, drainage): mild drainage in left ear; tinnitus in both ears  Vision (last eye exam, diplopia, recent changes): patient has glasses; reports at night when she is watching TV her eyes get blurry. Describes some blurring at times when she is trying to read signs while walking.   Current Symptoms: (dysarthria, dysphagia, drop attacks, bowel and bladder changes, recent weight loss/gain)    Review of systems negative for red flags. Fluctuates up and down 10 pounds     EXAMINATION  POSTURE:  Rounded shoulders  SOMATOSENSORY:  Any N & T in extremities or weakness: reports numbness in her lower legs feet to knees bilaterally. Patient with significantly decreased sensation and proprioception bilateral lower extremities knees to feet.      MUSCULOSKELETAL SCREEN: Cervical Spine ROM: AROM cervical spine WFL without pain.   Functional Mobility: Patient needs min A assistance with bringing her legs up onto the mat table and with rolling. Patient required MIn A to CGA with sit to stand from [facility] chair and mat table surfaces this date.   Gait: Patient arrives to clinic in a Styxis chair. Patient able to take a few small shuffling steps with Min A to move from [place] chair to/from mat table.   Balance: Patient is challenged by standing with narrow base of support, uneven surfaces, eyes closed conditions. Further balance to be assessed at follow up visits due to time constraints this date.  POSTURAL CONTROL TESTS:  Clinical Test of Sensory Interaction for Balance    (CTSIB):  CONDITION TIME STRATEGY SWAY  Eyes  open, firm surface 30 seconds ankle +2  Eyes closed, firm surface 30 seconds ankle +3  Eyes open, foam surface 30 seconds ankle +3  Eyes closed, foam surface 30 seconds ankle +3   Note: patient was unable to get to feet together position for all of the above due and patient's feet were about 6" apart. Patient was unaware that she was significantly supinating right ankle weightbearing on lateral surface of her foot with medial surface not touching the foam pad in standing with testing modified CTSIB. When had patient looked down at her foot, she was able to place her foot flat, but patient was unaware of the position of her foot and was unable to feel her feet.   OCULOMOTOR / VESTIBULAR TESTING:  Oculomotor Exam- Room Light  Normal Abnormal Comments  Ocular Alignment N    Ocular ROM N    Spontaneous Nystagmus N    Gaze evoked Nystagmus N    Smooth Pursuit N    Saccades  Abn Slower speed and a few hypometric saccades noted with right field vision  VOR N    VOR Cancellation N    Left Head Impulse N    Right Head Impulse N    Head Shaking Nystagmus   Deferred secondary to potentially positive BPPV   BPPV TESTS:  Symptoms Duration Intensity Nystagmus  Left Dix-Hallpike 5/10 dizziness About 1 minute 5/10  None observed in room light  Right Dix-Hallpike None N/A  None observed  Left Head Roll None N/A  None observed  Right Head Roll None N/A  None observed    FUNCTIONAL OUTCOME MEASURES:  Results Comments  DHI    72/100 Severe perception of handicap; in need of intervention  ABC Scale       28.7% High falls risk; in need of intervention  DGI    TBD    deferred   Canalith Repositioning Manuever: On mat table, performed left Dix-Hallpike testing and no nystagmus was observed but patient reports 5/10 dizziness but  no vertigo.  Performed left canalith repositioning maneuver (CRT).  Patient reported 5/10 dizziness with first Dix-Hallpike test and Epley maneuver and denied dizziness with  repeat testing.       PT Short Term Goals - 02/24/19 1648      PT SHORT TERM GOAL #1   Title  Pt will be independent with HEP in order to improve balance and decrease dizziness symptoms in order to decrease fall risk and improve function at home.    Time  4    Period  Weeks    Status  New    Target Date  03/19/19      PT Long Term Goals - 02/24/19 1648      PT LONG TERM GOAL #1   Title  Patient will reduce perceived disability to moderate levels as indicated by <70 on Dizziness Handicap Inventory [facility]) to demonstrate significant improvement in dizziness.    Baseline  scored 72/100 on 02/19/19    Time  12    Period  Weeks    Status  New    Target Date  05/14/19      PT LONG TERM GOAL #2   Title  Patient will report 50% or greater improvement in her symptoms of dizziness and imbalance with provoking motions or positions.    Time  12    Period  Weeks    Status  New    Target Date  05/14/19      PT LONG TERM GOAL #3   Title  Patient will reduce falls risk as indicated by Activities Specific Balance Confidence Scale (ABC) >60%.    Baseline  scored 28.7% on 02/19/19    Time  12    Period  Weeks    Status  New    Target Date  05/14/19      PT LONG TERM GOAL #4   Title  Patient will be able to perform light houehold tasks such as vacuuming and mopping for 30 minutes or more without dizziness symptoms and fatigue necessititating patient to take a sitting rest break in order for patient to be able to perform household tasks safely and efficiently.    Time  12    Period  Weeks    Status  New    Target Date  05/14/19       Plan - 02/24/19 1704    Clinical Impression Statement  Patient presents to clinic with reports of worsening dizziness and 3 falls in the past 6 months. Patient with potentially positive left Dix-Hallpike test and Epley maneuver performed this date. Patient with potential indicators of central and peripheral findings. Patient is at high risk of falls. Patient  has multi-factoral factors contributing to high falls risk. Patient with significantly decresaed bilateral lower extremity sensation knee to feet and proprioception. Patient would benefit from PT services to address functional deficits, try to decreased falls risk and patient's subjective symptoms of dizziness and imbalance.    Personal Factors and Comorbidities  Comorbidity 3+;Time since onset of injury/illness/exacerbation    Comorbidities  HTN, DM, COPD, anemia, depression, acute on chronic heart failure    Examination-Activity Limitations  Locomotion Level;Reach Overhead;Stairs;Transfers;Stand    Examination-Participation Restrictions  Cleaning;Community Activity;Shop    Stability/Clinical Decision Making  Evolving/Moderate complexity    Clinical Decision Making  Moderate    Rehab Potential  Fair    PT Frequency  Other (comment)   1-2 times a week   PT Duration  12 weeks    PT Treatment/Interventions  Canalith Repostioning;Gait training;Stair training;Therapeutic activities;Therapeutic exercise;Balance training;Neuromuscular re-education;Patient/family education;Vestibular;ADLs/Self Care Home Management    PT Next Visit Plan  retest DH bilateral, complete balance assessment, once DH testing is negative, try VOR    Consulted and Agree with Plan of Care  Patient               Patient will benefit from skilled therapeutic intervention in order to improve the following deficits and impairments:     Visit Diagnosis: Dizziness and giddiness  Difficulty in walking, not elsewhere classified  Muscle weakness (generalized)  History of falling     Problem List Patient Active Problem List   Diagnosis Date Noted   COPD (chronic obstructive pulmonary disease) ([place]) 08/25/2016   Acute on chronic heart failure with normal ejection fraction ([place]) 08/20/2016   Chronic diastolic heart failure ([place]) 06/02/2016   Bilateral lower leg cellulitis 05/23/2016   Leukocytosis 05/23/2016     Iron deficiency anemia 05/23/2016   Thrombocytosis ([place]) 05/23/2016   COPD with acute exacerbation ([place]) 05/21/2016   Pulmonary hypertension ([place]) 12/14/2015   Pneumonia 09/08/2015   Breast abscess 06/02/2015   Abnormal mammogram of both breasts 02/25/2015   Hidradenitis 12/30/2014   Diabetes ([place]) 12/30/2014   Essential hypertension 12/30/2014   Asthma 12/30/2014   Lady Deutscher PT, DPT [phone] Lady Deutscher 02/19/2019, 1:54 PM  [place] MAIN [facility] SERVICES [address] [place], [place], [address] Phone: [phone]   Fax:  [phone]  Name: AYELET YTURRALDE MRN: [id number] Date of Birth: 1955/11/02

## 2019-02-25 ENCOUNTER — Other Ambulatory Visit: Payer: Self-pay

## 2019-02-25 ENCOUNTER — Ambulatory Visit: Payer: Medicare Other | Admitting: Physical Therapy

## 2019-02-25 ENCOUNTER — Encounter: Payer: Self-pay | Admitting: Physical Therapy

## 2019-02-25 DIAGNOSIS — R262 Difficulty in walking, not elsewhere classified: Secondary | ICD-10-CM

## 2019-02-25 DIAGNOSIS — R42 Dizziness and giddiness: Secondary | ICD-10-CM | POA: Diagnosis not present

## 2019-02-25 DIAGNOSIS — M6281 Muscle weakness (generalized): Secondary | ICD-10-CM

## 2019-02-25 DIAGNOSIS — Z9181 History of falling: Secondary | ICD-10-CM

## 2019-02-25 NOTE — Therapy (Signed)
[place] MAIN [facility] SERVICES [address] [place], [place], [address] Phone: [phone]   Fax:  [phone]  Physical Therapy Treatment  Patient Details  Name: [name] [name] MRN: [id number] Date of Birth: 1955/11/06 Referring Provider (PT): Brett Fairy, [place]   Encounter Date: 02/25/2019  PT End of Session - 02/25/19 1342    Visit Number  2    Number of Visits  13    Date for PT Re-Evaluation  05/14/19    PT Start Time  [id number]    PT Stop Time  1417    PT Time Calculation (min)  42 min    Equipment Utilized During Treatment  --    Activity Tolerance  Patient tolerated treatment well    Behavior During Therapy  [facility] for tasks assessed/performed       Past Medical History:  Diagnosis Date  . Allergic rhinitis   . Allergy   . Asthma   . Back pain   . CHF (congestive heart failure) ([place])   . COPD (chronic obstructive pulmonary disease) ([place])   . Degenerative joint disease of knee, left   . Depression   . Diabetes mellitus without complication ([place])   . Edema   . Elevated lipids   . GERD (gastroesophageal reflux disease)   . Hidradenitis   . History of colonic polyps   . Hypertension   . Hypothyroidism   . IBS (irritable bowel syndrome)   . Insomnia   . Obesity   . Oxygen dependent   . Pneumonia   . PVD (peripheral vascular disease) ([place])   . Sinusitis, chronic   . Sleep apnea   . Thyroid disease   . Vaginitis, atrophic   . Vertigo   . Vitamin D deficiency     Past Surgical History:  Procedure Laterality Date  . ABDOMINAL HYSTERECTOMY    . AXILLARY HIDRADENITIS EXCISION    . BREAST EXCISIONAL BIOPSY Bilateral [id number]   RUPTURED FOLLICULAR CYSTS WITH ABSCESSES AND SCARRING, CONSISTENT WITH HIDRADENITIS SUPPURATIVA.   [name] CESAREAN SECTION     x 2  . CHOLECYSTECTOMY    . HEEL SPUR EXCISION N/A   . HYDRADENITIS EXCISION Right 12/31/2015   Procedure: EXCISION HIDRADENITIS AXILLA;  Surgeon: Clayburn Pert, MD;  Location:  [facility] ORS;  Service: General;  Laterality: Right;  . KNEE SURGERY Left    1998  . TONSILLECTOMY      There were no vitals filed for this visit.  Subjective Assessment - 02/25/19 1337    Subjective  Patient states she was staggering after the evaluation that day. Patient reports she has had some dizziness since the evaluation and reports the Epley maneuver helped "somewhat".    Pertinent History  Patient reports that she has been having dizziness for on and off for 5 years, but states it has gotten worse over time. Patient reports recently she was reaching up for a box on top of a fridge and she had dizziness and she fell and struck her head on a cement floor. Denies having had a VNG test. Patient reports vertigo, unsteadiness, lightheadedness, and tinnitus in both ears at times. Patient reports the dizziness lasts until she sits down. Patient reports looking up, looking down, quick movements, head turns bring on her symptoms. Patient reports sitting down helps ease her symptoms. Patient reports she has slept in a recliner chair for years. Patient reports she gets dizziness daily. Patient states she has a rollator that she uses to walk to the  front door and to get to the car. Patient has a another smaller rollator walker that she has in the car for community mobility, but states she uses the handicap Research officer, trade union for community mobility whenever available. Patient would like to be able to do housework like vacuuming, mopping floors. Patient states she tries to do housework, but it takes almost all day due to having to stop frequently due to dizziness symptoms.    Patient Stated Goals  to have decreased dizziness, to be able to do light housekeeping activities       Canalith Repositioning Manuever: On mat table, performed left Dix-Hallpike testing and no nystagmus observed, but patient reports 3-4/10 dizziness which she reports is the same kind of dizziness she gets at home especially when she sat up  at the end of the maneuver. Performed left canalith repositioning maneuver (CRT). Repeated left CRT for a total of 2 maneuvers with retesting between maneuvers. Patient reported 3-4/10 dizziness with maneuvers.     VOR X 1 exercise:  Demonstrated and educated as to VOR X1.  Patient performed VOR X 1 horizontal in sitting 1 rep of 30 seconds and 2 reps of 1 minute each with verbal cues for technique.  Patient reports denies dizziness with this activity but reports "fogginess".  Did not add to HEP as VOR did not recreate dizziness symptoms.     PT Education - 02/25/19 1342    Education Details  reviewed BBPV and Epley    Person(s) Educated  Patient    Methods  Explanation    Comprehension  Verbalized understanding       PT Short Term Goals - 02/24/19 1648      PT SHORT TERM GOAL #1   Title  Pt will be independent with HEP in order to improve balance and decrease dizziness symptoms in order to decrease fall risk and improve function at home.    Time  4    Period  Weeks    Status  New    Target Date  03/19/19        PT Long Term Goals - 02/24/19 1648      PT LONG TERM GOAL #1   Title  Patient will reduce perceived disability to moderate levels as indicated by <70 on Dizziness Handicap Inventory [facility]) to demonstrate significant improvement in dizziness.    Baseline  scored 72/100 on 02/19/19    Time  12    Period  Weeks    Status  New    Target Date  05/14/19      PT LONG TERM GOAL #2   Title  Patient will report 50% or greater improvement in her symptoms of dizziness and imbalance with provoking motions or positions.    Time  12    Period  Weeks    Status  New    Target Date  05/14/19      PT LONG TERM GOAL #3   Title  Patient will reduce falls risk as indicated by Activities Specific Balance Confidence Scale (ABC) >60%.    Baseline  scored 28.7% on 02/19/19    Time  12    Period  Weeks    Status  New    Target Date  05/14/19      PT LONG TERM GOAL #4   Title   Patient will be able to perform light houehold tasks such as vacuuming and mopping for 30 minutes or more without dizziness symptoms and fatigue necessititating patient to take a  sitting rest break in order for patient to be able to perform household tasks safely and efficiently.    Time  12    Period  Weeks    Status  New    Target Date  05/14/19            Plan - 02/26/19 [id number]    Clinical Impression Statement  Patient reports some improvement in her symptoms since last session. Patient reports 3-4/10 instead of 5/10 with lefet Dix-Hallpike test this date. Patient performed VOR X 1 and this did not reproduce any dizziness symptoms for patient. Will plan on retesting left Dix-Hallpike test next session and perform CRT/Epley if indicated. Pateint would also benefit from balance and strengthening. Patient would benefit from PT services to address symptoms of dizziness, to reduce falls risk and to address functional deficits as set on plan of care.    Personal Factors and Comorbidities  Comorbidity 3+;Time since onset of injury/illness/exacerbation    Comorbidities  HTN, DM, COPD, anemia, depression, acute on chronic heart failure    Examination-Activity Limitations  Locomotion Level;Reach Overhead;Stairs;Transfers;Stand    Examination-Participation Restrictions  Cleaning;Community Activity;Shop    Stability/Clinical Decision Making  Evolving/Moderate complexity    Rehab Potential  Fair    PT Frequency  Other (comment)   1-2 times a week   PT Duration  12 weeks    PT Treatment/Interventions  Canalith Repostioning;Gait training;Stair training;Therapeutic activities;Therapeutic exercise;Balance training;Neuromuscular re-education;Patient/family education;Vestibular;ADLs/Self Care Home Management    PT Next Visit Plan  retest DH bilateral, complete balance assessment, once DH testing is negative, try VOR    Consulted and Agree with Plan of Care  Patient       Patient will benefit from skilled  therapeutic intervention in order to improve the following deficits and impairments:  Decreased balance, Decreased endurance, Decreased mobility, Difficulty walking, Impaired sensation, Dizziness, Obesity, Decreased activity tolerance, Decreased safety awareness, Decreased strength, Other (comment)(proprioception impaired)  Visit Diagnosis: Dizziness and giddiness  Difficulty in walking, not elsewhere classified  Muscle weakness (generalized)  History of falling     Problem List Patient Active Problem List   Diagnosis Date Noted  . COPD (chronic obstructive pulmonary disease) ([place]) 08/25/2016  . Acute on chronic heart failure with normal ejection fraction ([place]) 08/20/2016  . Chronic diastolic heart failure ([place]) 06/02/2016  . Bilateral lower leg cellulitis 05/23/2016  . Leukocytosis 05/23/2016  . Iron deficiency anemia 05/23/2016  . Thrombocytosis ([place]) 05/23/2016  . COPD with acute exacerbation ([place]) 05/21/2016  . Pulmonary hypertension ([place]) 12/14/2015  . Pneumonia 09/08/2015  . Breast abscess 06/02/2015  . Abnormal mammogram of both breasts 02/25/2015  . Hidradenitis 12/30/2014  . Diabetes ([place]) 12/30/2014  . Essential hypertension 12/30/2014  . Asthma 12/30/2014   Lady Deutscher PT, DPT [phone] Lady Deutscher 02/26/2019, 8:48 AM  [place] MAIN [facility] [address] [place], [place], [address] Phone: [phone]   Fax:  [phone]  Name: MYAUNA CULLINAN MRN: [id number] Date of Birth: 1955-05-09

## 2019-03-04 ENCOUNTER — Encounter: Payer: Self-pay | Admitting: Physical Therapy

## 2019-03-04 ENCOUNTER — Other Ambulatory Visit: Payer: Self-pay

## 2019-03-04 ENCOUNTER — Ambulatory Visit: Payer: Medicare Other | Admitting: Physical Therapy

## 2019-03-04 DIAGNOSIS — R42 Dizziness and giddiness: Secondary | ICD-10-CM

## 2019-03-04 DIAGNOSIS — Z9181 History of falling: Secondary | ICD-10-CM

## 2019-03-04 DIAGNOSIS — R262 Difficulty in walking, not elsewhere classified: Secondary | ICD-10-CM

## 2019-03-04 DIAGNOSIS — M6281 Muscle weakness (generalized): Secondary | ICD-10-CM

## 2019-03-04 NOTE — Therapy (Signed)
[place] MAIN [facility] SERVICES [address] [place], [place], [address] Phone: [phone]   Fax:  [phone]  Physical Therapy Treatment  Patient Details  Name: [name] [name] MRN: [id number] Date of Birth: [birth year] Referring Provider (PT): Brett Fairy, [place]   Encounter Date: 03/04/2019  PT End of Session - 03/04/19 1347    Visit Number  3    Number of Visits  13    Date for PT Re-Evaluation  05/14/19    PT Start Time  1346    PT Stop Time  1440    PT Time Calculation (min)  54 min    Equipment Utilized During Treatment  Gait belt    Activity Tolerance  Patient tolerated treatment well    Behavior During Therapy  [facility] for tasks assessed/performed       Past Medical History:  Diagnosis Date  . Allergic rhinitis   . Allergy   . Asthma   . Back pain   . CHF (congestive heart failure) ([place])   . COPD (chronic obstructive pulmonary disease) ([place])   . Degenerative joint disease of knee, left   . Depression   . Diabetes mellitus without complication ([place])   . Edema   . Elevated lipids   . GERD (gastroesophageal reflux disease)   . Hidradenitis   . History of colonic polyps   . Hypertension   . Hypothyroidism   . IBS (irritable bowel syndrome)   . Insomnia   . Obesity   . Oxygen dependent   . Pneumonia   . PVD (peripheral vascular disease) ([place])   . Sinusitis, chronic   . Sleep apnea   . Thyroid disease   . Vaginitis, atrophic   . Vertigo   . Vitamin D deficiency     Past Surgical History:  Procedure Laterality Date  . ABDOMINAL HYSTERECTOMY    . AXILLARY HIDRADENITIS EXCISION    . BREAST EXCISIONAL BIOPSY Bilateral [id number]   RUPTURED FOLLICULAR CYSTS WITH ABSCESSES AND SCARRING, CONSISTENT WITH HIDRADENITIS SUPPURATIVA.   [name] CESAREAN SECTION     x 2  . CHOLECYSTECTOMY    . HEEL SPUR EXCISION N/A   . HYDRADENITIS EXCISION Right 12/31/2015   Procedure: EXCISION HIDRADENITIS AXILLA;  Surgeon: Clayburn Pert, MD;   Location: [facility] ORS;  Service: General;  Laterality: Right;  . KNEE SURGERY Left    1998  . TONSILLECTOMY      There were no vitals filed for this visit.  Subjective Assessment - 03/04/19 1347    Subjective  Patient states she has not noticed any changes with her symptoms.    Pertinent History  Patient reports that she has been having dizziness for on and off for 5 years, but states it has gotten worse over time. Patient reports recently she was reaching up for a box on top of a fridge and she had dizziness and she fell and struck her head on a cement floor. Denies having had a VNG test. Patient reports vertigo, unsteadiness, lightheadedness, and tinnitus in both ears at times. Patient reports the dizziness lasts until she sits down. Patient reports looking up, looking down, quick movements, head turns bring on her symptoms. Patient reports sitting down helps ease her symptoms. Patient reports she has slept in a recliner chair for years. Patient reports she gets dizziness daily. Patient states she has a rollator that she uses to walk to the front door and to get to the car. Patient has a another smaller rollator walker  that she has in the car for community mobility, but states she uses the handicap [employer] for community mobility whenever available. Patient would like to be able to do housework like vacuuming, mopping floors. Patient states she tries to do housework, but it takes almost all day due to having to stop frequently due to dizziness symptoms.    Patient Stated Goals  to have decreased dizziness, to be able to do light housekeeping activities        Canalith Repositioning Manuever: On mat table, performed left Dix-Hallpike testing and it was  nystagmus of short duration without latency. Performed left canalith repositioning maneuver (CRT). Patient reported 1/10 vertigo.  After canalith repositioning maneuver, performed  Neuromuscular Re-education:  Non-compliant/ Firm  Surface: On firm surface, patient performed feet together progressions (progressed to feet about 3" apart) and semi-tandem progressions with alternating lead leg with and without horizontal and vertical head turns, multiple 30 second and then 1 minute reps. Patient reports 2-3/10 dizziness with these activities.  VOR X 1 exercise:  Demonstrated and educated as to VOR X1.  Patient performed VOR X 1 horizontal in sitting 3 reps of 1 minute each with verbal cues for technique.  Patient denies dizziness.   Sit to Stands:  Patient educated as to proper form with sit to stand. Patient performed 10 reps times 2 reps sit to stand without UEs with verbal cuing for sequencing and to control eccentric descent with SBA.  Patient reports leg fatigue towards end of set and mild knee discomfort.   Airex pad:  On Airex pad, patient performed normal stance with horizontal head turns for 1 minute rep.    PT Education - 03/04/19 1352    Education Details  issued vestibular handout slides 1,4,5,13- feet together and semi tandem progressions with horizontal and vertical head turns on firm surface and sit to stand exercises; issued and discussed home exercise program including safety precautions; discussed performing HEP standing in corner with chair in front for safety.    Person(s) Educated  Patient    Methods  Explanation;Demonstration;Verbal cues;Handout    Comprehension  Verbalized understanding;Returned demonstration       PT Short Term Goals - 02/24/19 1648      PT SHORT TERM GOAL #1   Title  Pt will be independent with HEP in order to improve balance and decrease dizziness symptoms in order to decrease fall risk and improve function at home.    Time  4    Period  Weeks    Status  New    Target Date  03/19/19        PT Long Term Goals - 02/24/19 1648      PT LONG TERM GOAL #1   Title  Patient will reduce perceived disability to moderate levels as indicated by <70 on Dizziness Handicap  Inventory [facility]) to demonstrate significant improvement in dizziness.    Baseline  scored 72/100 on 02/19/19    Time  12    Period  Weeks    Status  New    Target Date  05/14/19      PT LONG TERM GOAL #2   Title  Patient will report 50% or greater improvement in her symptoms of dizziness and imbalance with provoking motions or positions.    Time  12    Period  Weeks    Status  New    Target Date  05/14/19      PT LONG TERM GOAL #3   Title  Patient will reduce falls risk as indicated by Activities Specific Balance Confidence Scale (ABC) >60%.    Baseline  scored 28.7% on 02/19/19    Time  12    Period  Weeks    Status  New    Target Date  05/14/19      PT LONG TERM GOAL #4   Title  Patient will be able to perform light houehold tasks such as vacuuming and mopping for 30 minutes or more without dizziness symptoms and fatigue necessititating patient to take a sitting rest break in order for patient to be able to perform household tasks safely and efficiently.    Time  12    Period  Weeks    Status  New    Target Date  05/14/19            Plan - 03/04/19 1448    Clinical Impression Statement  Patient reports no change in her symptoms since last visit. Patient reports 1/10 dizziness in left Dix-Hallpike position and performed one Epley maneuver. Patient reports no dizziness with VOR X 1 exercise but reports 2-3/10 dizziness with standing exercises with head turns and added these to HEP. Patient issued HEP and reviewed with patient including safety precautions. Patient would benefit from continued PT services to further address functional deficits and goals.    Personal Factors and Comorbidities  Comorbidity 3+;Time since onset of injury/illness/exacerbation    Comorbidities  HTN, DM, COPD, anemia, depression, acute on chronic heart failure    Examination-Activity Limitations  Locomotion Level;Reach Overhead;Stairs;Transfers;Stand    Examination-Participation Restrictions   Cleaning;Community Activity;Shop    Stability/Clinical Decision Making  Evolving/Moderate complexity    Rehab Potential  Fair    PT Frequency  Other (comment)   1-2 times a week   PT Duration  12 weeks    PT Treatment/Interventions  Canalith Repostioning;Gait training;Stair training;Therapeutic activities;Therapeutic exercise;Balance training;Neuromuscular re-education;Patient/family education;Vestibular;ADLs/Self Care Home Management    PT Next Visit Plan  retest DH bilateral, complete balance assessment, once DH testing is negative, try VOR    Consulted and Agree with Plan of Care  Patient       Patient will benefit from skilled therapeutic intervention in order to improve the following deficits and impairments:  Decreased balance, Decreased endurance, Decreased mobility, Difficulty walking, Impaired sensation, Dizziness, Obesity, Decreased activity tolerance, Decreased safety awareness, Decreased strength, Other (comment)(proprioception impaired)  Visit Diagnosis: Dizziness and giddiness  Difficulty in walking, not elsewhere classified  Muscle weakness (generalized)  History of falling     Problem List Patient Active Problem List   Diagnosis Date Noted  . COPD (chronic obstructive pulmonary disease) ([place]) 08/25/2016  . Acute on chronic heart failure with normal ejection fraction ([place]) 08/20/2016  . Chronic diastolic heart failure ([place]) 06/02/2016  . Bilateral lower leg cellulitis 05/23/2016  . Leukocytosis 05/23/2016  . Iron deficiency anemia 05/23/2016  . Thrombocytosis ([place]) 05/23/2016  . COPD with acute exacerbation ([place]) 05/21/2016  . Pulmonary hypertension ([place]) 12/14/2015  . Pneumonia 09/08/2015  . Breast abscess 06/02/2015  . Abnormal mammogram of both breasts 02/25/2015  . Hidradenitis 12/30/2014  . Diabetes ([place]) 12/30/2014  . Essential hypertension 12/30/2014  . Asthma 12/30/2014   Lady Deutscher PT, DPT [phone] Lady Deutscher 03/04/2019, 2:51 PM  [place] MAIN [facility] [address] [place], [place], [address] Phone: [phone]   Fax:  [phone]  Name: [name] [name] MRN: [id number] Date of Birth: [birth year]

## 2019-03-11 ENCOUNTER — Ambulatory Visit: Payer: Medicare Other | Admitting: Physical Therapy

## 2019-03-18 ENCOUNTER — Ambulatory Visit: Payer: Medicare Other | Admitting: Physical Therapy

## 2019-03-25 ENCOUNTER — Encounter: Payer: Medicare Other | Admitting: Physical Therapy

## 2019-04-01 ENCOUNTER — Ambulatory Visit: Payer: Medicare Other | Admitting: Physical Therapy

## 2019-04-08 ENCOUNTER — Ambulatory Visit: Payer: Medicare Other | Admitting: Physical Therapy

## 2019-05-08 ENCOUNTER — Ambulatory Visit: Payer: Medicare Other | Admitting: Family

## 2019-05-12 ENCOUNTER — Other Ambulatory Visit: Payer: Self-pay

## 2019-05-12 ENCOUNTER — Ambulatory Visit (INDEPENDENT_AMBULATORY_CARE_PROVIDER_SITE_OTHER): Payer: Medicare Other | Admitting: Acute Care

## 2019-05-12 ENCOUNTER — Encounter: Payer: Self-pay | Admitting: Acute Care

## 2019-05-12 VITALS — BP 142/78 | HR 88 | Temp 97.0°F | Ht 63.0 in | Wt 271.8 lb

## 2019-05-12 DIAGNOSIS — R5381 Other malaise: Secondary | ICD-10-CM

## 2019-05-12 DIAGNOSIS — E669 Obesity, unspecified: Secondary | ICD-10-CM | POA: Diagnosis not present

## 2019-05-12 DIAGNOSIS — G4733 Obstructive sleep apnea (adult) (pediatric): Secondary | ICD-10-CM | POA: Diagnosis not present

## 2019-05-12 DIAGNOSIS — G4736 Sleep related hypoventilation in conditions classified elsewhere: Secondary | ICD-10-CM

## 2019-05-12 DIAGNOSIS — J449 Chronic obstructive pulmonary disease, unspecified: Secondary | ICD-10-CM | POA: Diagnosis not present

## 2019-05-12 NOTE — Progress Notes (Signed)
History of Present Illness [name] [name] is a 64 y.o. female former smoker ( 35 pack year history , quit 1995 ) with history of  COPD , allergies, and sleep apnea per in lab sleep study done 4-5 years ago. She had a CPAP machine, but she could not tolerate it. Consult to evaluate if pt. Is still + for OSA. She will be followed by Dr. Mortimer [name].  Referred by Dr.Joven Mom  Posey [name] to evaluate for continued OSA.  Past Medical History:  Diagnosis Date  . Allergic rhinitis   . Allergy   . Asthma   . Back pain   . CHF (congestive heart failure) ([place])   . COPD (chronic obstructive pulmonary disease) ([place])   . Degenerative joint disease of knee, left   . Depression   . Diabetes mellitus without complication ([place])   . Edema   . Elevated lipids   . GERD (gastroesophageal reflux disease)   . Hidradenitis   . History of colonic polyps   . Hypertension   . Hypothyroidism   . IBS (irritable bowel syndrome)   . Insomnia   . Obesity   . Oxygen dependent   . Pneumonia   . PVD (peripheral vascular disease) ([place])   . Sinusitis, chronic   . Sleep apnea   . Thyroid disease   . Vaginitis, atrophic   . Vertigo   . Vitamin D deficiency      05/12/2019  Pt.presents for consult to evaluate for OSA. [name] She has been diagnosed with OSA  in the past, but she did not tolerate CPAP therapy.She is  In the process of being worked up for possible gastric Bypass surgery. She states she has daytime sleepiness. She wakes up with a headache 2-3 mornings a week. States she falls asleep while watching TV multiple times a week, and falls asleep when she is relaxing.She  Goes to bed at 1-2 am each night. She awakens at about noon each day. . She finds that she is very forgetful. She endorses having trouble falling asleep. She sleeps with a fan on for white noise, and she sleeps with the TV on. She has a Body mass index is 48.15 kg/m. She states her weight fluctuates 15-20 pounds up and down. She does not wear her nocturnal  oxygen. We discussed the risks of not wearing her nocturnal oxygen. We also discussed the risks of untreated OSA.  Pt. Sleeps in a lift chair.She takes Symbicort as maintenance for her COPD and uses albuterol for break through shortness of breath. She states she is compliant with both medications. She uses her albuterol every day.She denies fever, chest pain,or hemoptysis. She does have orthopnea.   Test Results: 05/2016 Echo Borderline left ventricle systolic function with left ventricle  ejection fraction approximately 50% visually although quality of  the study was suboptimal. There is wall motion abnormality  suggestive of coronary artery disease and mild diastolic  dysfunction.   Sleep Study   CBC Latest Ref Rng & Units 06/25/2017 12/21/2016 10/21/2016  WBC 3.6 - 11.0 K/uL 14.0(H) 12.8(H) 13.4(H)  Hemoglobin 12.0 - 16.0 g/dL 11.4(L) 11.4(L) 10.7(L)  Hematocrit 35.0 - 47.0 % 37.1 36.0 33.8(L)  Platelets 150 - 440 K/uL 375 417 352    BMP Latest Ref Rng & Units 06/25/2017 12/21/2016 11/15/2016  Glucose 65 - 99 mg/dL 113(H) 90 -  BUN 6 - 20 mg/dL 16 13 10   Creatinine 0.44 - 1.00 mg/dL 0.85 0.89 0.82  BUN/Creat Ratio 12 - 28 - -  12  Sodium 135 - 145 mmol/L 136 140 -  Potassium 3.5 - 5.1 mmol/L 3.5 3.7 -  Chloride 101 - 111 mmol/L 100(L) 100(L) -  CO2 22 - 32 mmol/L 27 29 -  Calcium 8.9 - 10.3 mg/dL 9.1 9.4 -    BNP    Component Value Date/Time   BNP 36.0 05/20/2016 2155    ProBNP No results found for: PROBNP  PFT No results found for: FEV1PRE, FEV1POST, FVCPRE, FVCPOST, TLC, DLCOUNC, PREFEV1FVCRT, PSTFEV1FVCRT  No results found.   Past medical hx Past Medical History:  Diagnosis Date  . Allergic rhinitis   . Allergy   . Asthma   . Back pain   . CHF (congestive heart failure) ([place])   . COPD (chronic obstructive pulmonary disease) ([place])   . Degenerative joint disease of knee, left   . Depression   . Diabetes mellitus without complication ([place])   . Edema   .  Elevated lipids   . GERD (gastroesophageal reflux disease)   . Hidradenitis   . History of colonic polyps   . Hypertension   . Hypothyroidism   . IBS (irritable bowel syndrome)   . Insomnia   . Obesity   . Oxygen dependent   . Pneumonia   . PVD (peripheral vascular disease) ([place])   . Sinusitis, chronic   . Sleep apnea   . Thyroid disease   . Vaginitis, atrophic   . Vertigo   . Vitamin D deficiency      Social History   Tobacco Use  . Smoking status: Former Smoker    Quit date: 12/18/1993    Years since quitting: 25.4  . Smokeless tobacco: Never Used  Substance Use Topics  . Alcohol use: No  . Drug use: No    [name] reports that she quit smoking about 25 years ago. She has never used smokeless tobacco. She reports that she does not drink alcohol or use drugs.  Tobacco Cessation: Former smoker with a 35 pack year smoking history  Past surgical hx, Family hx, Social hx all reviewed.  Current Outpatient Medications on File Prior to Visit  Medication Sig  . albuterol (PROVENTIL HFA;VENTOLIN HFA) 108 (90 Base) MCG/ACT inhaler Inhale 2 puffs into the lungs every 6 (six) hours as needed for wheezing or shortness of breath.  [name] albuterol (PROVENTIL) (2.5 MG/3ML) 0.083% nebulizer solution Take 2.5 mg by nebulization every 6 (six) hours as needed for wheezing or shortness of breath.   [name] ammonium lactate (AMLACTIN) 12 % cream Apply topically as needed for dry skin.  [name] aspirin EC 81 MG tablet Take 81 mg by mouth daily.  [name] atorvastatin (LIPITOR) 20 MG tablet Take 20 mg by mouth at bedtime.   . budesonide-formoterol (SYMBICORT) 160-4.5 MCG/ACT inhaler Inhale 2 puffs into the lungs 2 (two) times daily.  . clindamycin (CLINDAGEL) 1 % gel Apply 1 application topically 2 (two) times daily.  . clobetasol (TEMOVATE) 0.05 % external solution   . DALIRESP 500 MCG TABS tablet Take 500 mcg by mouth daily.   [name] doxepin (SINEQUAN) 50 MG capsule Take 50 mg by mouth at bedtime.  [name] doxycycline  (MONODOX) 100 MG capsule   . Dulaglutide (TRULICITY) 0.10 XN/2.3FT SOPN Inject into the skin once a week.  . DULoxetine (CYMBALTA) 20 MG capsule Take 20 mg by mouth daily.  . fluocinonide (LIDEX) 0.05 % external solution Apply 1 application topically 2 (two) times daily.  . fluticasone (FLONASE) 50 MCG/ACT nasal spray   . gabapentin (NEURONTIN)  300 MG capsule Take 300 mg by mouth 2 (two) times daily.   [name] glucose blood test strip TEST three times a day  . HUMULIN R U-500 KWIKPEN 500 UNIT/ML injection Inject 120 Units into the skin 2 (two) times daily with a meal.   . ibuprofen (ADVIL,MOTRIN) 800 MG tablet Take 1 tablet (800 mg total) by mouth every 8 (eight) hours as needed for mild pain or moderate pain (with food).  [name] ketoconazole (NIZORAL) 2 % shampoo Apply 1 application topically 2 (two) times a week.  . Lancets Misc. (ACCU-CHEK FASTCLIX LANCET) KIT   . levothyroxine (SYNTHROID, LEVOTHROID) 200 MCG tablet Take 200 mcg by mouth daily before breakfast.   . LUTEIN PO Take 1 tablet by mouth daily.  . meclizine (ANTIVERT) 25 MG tablet Take 25 mg by mouth 3 (three) times daily as needed for dizziness.  . Melatonin 5 MG CAPS Take 2 capsules by mouth daily.  . mometasone (NASONEX) 50 MCG/ACT nasal spray Place 2 sprays into both nostrils daily.   . montelukast (SINGULAIR) 10 MG tablet Take 10 mg by mouth at bedtime.   [name] nystatin (MYCOSTATIN) 100000 UNIT/ML suspension   . olopatadine (PATANOL) 0.1 % ophthalmic solution Place 1 drop into both eyes 2 (two) times daily.  [name] omeprazole (PRILOSEC) 40 MG capsule Take 40 mg by mouth daily.  . potassium citrate (UROCIT-K) 10 MEQ (1080 MG) SR tablet Take 1 tablet (10 mEq total) by mouth every morning.  [name] spironolactone (ALDACTONE) 50 MG tablet Take 100 mg by mouth daily.   . tacrolimus (PROTOPIC) 0.1 % ointment Apply topically 2 (two) times daily.  [name] torsemide (DEMADEX) 20 MG tablet TAKE  1 TABLETS BY MOUTH daily  . traZODone (DESYREL) 50 MG tablet Take  50-100 mg by mouth at bedtime as needed for sleep.   [name] triamcinolone cream (KENALOG) 0.1 % Apply 1 application topically 2 (two) times daily.  . TRULICITY 1.5 HE/1.7EY SOPN Inject 1.5 mg into the skin once a week.   No current facility-administered medications on file prior to visit.     Allergies  Allergen Reactions  . Codeine Itching    Review Of Systems:  Constitutional:   No  weight loss, night sweats,  Fevers, chills, fatigue, or  lassitude.  HEENT:   No headaches,  Difficulty swallowing,  Tooth/dental problems, or  Sore throat,                No sneezing, itching, ear ache, nasal congestion, post nasal drip,   CV:  No chest pain,  Orthopnea, PND, swelling in lower extremities, anasarca, dizziness, palpitations, syncope.   GI  No heartburn, indigestion, abdominal pain, nausea, vomiting, diarrhea, change in bowel habits, loss of appetite, bloody stools.   Resp: + shortness of breath with exertion or at rest.  Baseline  excess mucus, Baseline  productive cough,  No non-productive cough,  No coughing up of blood.  No change in color of mucus.  + wheezing.  No chest wall deformity  Skin: no rash or lesions.  GU: no dysuria, change in color of urine, no urgency or frequency.  No flank pain, no hematuria   MS:  No joint pain or swelling.  No decreased range of motion.  No back pain.  Psych:  No change in mood or affect. No depression or anxiety.  No memory loss.   Vital Signs BP (!) 142/78 (BP Location: Left Arm, Patient Position: Sitting, Cuff Size: Large)   Pulse 88   Temp [name])  83 F (36.1 C) (Temporal)   Ht 5' 3"  (1.6 m)   Wt 271 lb 12.8 oz (123.3 kg)   SpO2 92% Comment: on ra  BMI 48.15 kg/m    Physical Exam:  General- No distress,  A&Ox3, pleasant ENT: No sinus tenderness, TM clear, pale nasal mucosa, no oral exudate,no post nasal drip, no LAN Cardiac: S1, S2, regular rate and rhythm, no murmur Chest: + Faint right upper lobe  wheeze/ No rales/ dullness; no  accessory muscle use, no nasal flaring, no sternal retractions, diminished per bases, prolonged expiratory phase Abd.: Soft Non-tender, ND, BS +, Body mass index is 48.15 kg/m. Ext: No clubbing cyanosis, edema Neuro:  normal strength, MAE x 4, A&O x 3, deconditioned Skin: No rashes, No lesions, warm and dry Psych: normal mood and behavior   Assessment/Plan   Consult for Suspected OSA Previous diagnosis 4-5 years ago Did not tolerate CPAP Poor Sleep Hygiene In process of getting cleared for Gastric Bypass Epworth Score of 4 Plan Home Sleep Study to re-evaluate for OSA Once resulted will call patient with diagnosis and options for treatment  Remember to establish a good bedtime routine, and work on sleep hygiene.  Limit daytime naps , avoid stimulants such as caffeine and nicotine close to bedtime, exercise daily to promote sleep quality, avoid heavy , spicy, fried , or rich foods before bed. Ensure adequate exposure to natural light during the day,establish a relaxing bedtime routine with a pleasant sleep environment ( Bedroom between 60 and 67 degrees, turn off bright lights , TV or device screens screens , consider black out curtains or white noise machines) Do not drive if sleepy. Follow up with Dr. Mortimer [name] after home sleep study is completed.  Nocturnal Hypoxemia  Plan Wear nocturnal oxygen at 2 L [place] every night without fail.  COPD Continue Symbicort 2 puffs twice daily as you have been doing 2 puffs twice daily Rinse mouth after use Albuterol for breakthrough shortness of breath up to 3 times daily as needed Note your daily symptoms > remember "red flags" for COPD:  Increase in cough, increase in sputum production, increase in shortness of breath or activity intolerance. If you notice these symptoms, please call to be seen.    Physical Deconditioning Plan Consider Pulmonary Rehab  This appointment was  45 min long with over 50% of the time in direct face-to-face patient care,  assessment, plan of care, and follow-up.    Magdalen Spatz, NP 05/12/2019  4:00 PM

## 2019-05-12 NOTE — Patient Instructions (Addendum)
It is nice to meet you today. We will order a home sleep study.( Please place under Dr. Zoila Shutter name as he will be reading the study) If your insurance will not approve a home sleep study we will schedule an in lab study. Please work on your sleep hygiene  Continue to work on weight loss, as the link between excess weight  and sleep apnea is well established.   Remember to establish a good bedtime routine, and work on sleep hygiene.  Limit daytime naps , avoid stimulants such as caffeine and nicotine close to bedtime, exercise daily to promote sleep quality, avoid heavy , spicy, fried , or rich foods before bed. Ensure adequate exposure to natural light during the day,establish a relaxing bedtime routine with a pleasant sleep environment ( Bedroom between 60 and 67 degrees, turn off bright lights , TV or device screens  , consider black out curtains or white noise machines) Do not drive if sleepy. We will call you with the results of your sleep test.  Continue taking xyzol daily as you have been doing. Continue Symbicort and Albuterol for COPD Rinse mouth after use Please wear your night time oxygen at 2 L every night. Follow up with Dr. Mortimer Fries after sleep test results are read. Please contact office for sooner follow up if symptoms do not improve or worsen or seek emergency care

## 2019-05-13 ENCOUNTER — Encounter: Payer: Self-pay | Admitting: Family

## 2019-05-13 ENCOUNTER — Ambulatory Visit: Payer: Medicare Other | Attending: Family | Admitting: Family

## 2019-05-13 VITALS — BP 124/49 | HR 87 | Resp 18 | Ht 63.0 in | Wt 271.0 lb

## 2019-05-13 DIAGNOSIS — I10 Essential (primary) hypertension: Secondary | ICD-10-CM

## 2019-05-13 DIAGNOSIS — Z8249 Family history of ischemic heart disease and other diseases of the circulatory system: Secondary | ICD-10-CM | POA: Insufficient documentation

## 2019-05-13 DIAGNOSIS — Z7982 Long term (current) use of aspirin: Secondary | ICD-10-CM | POA: Insufficient documentation

## 2019-05-13 DIAGNOSIS — Z885 Allergy status to narcotic agent status: Secondary | ICD-10-CM | POA: Diagnosis not present

## 2019-05-13 DIAGNOSIS — E1151 Type 2 diabetes mellitus with diabetic peripheral angiopathy without gangrene: Secondary | ICD-10-CM | POA: Insufficient documentation

## 2019-05-13 DIAGNOSIS — Z79899 Other long term (current) drug therapy: Secondary | ICD-10-CM | POA: Diagnosis not present

## 2019-05-13 DIAGNOSIS — J449 Chronic obstructive pulmonary disease, unspecified: Secondary | ICD-10-CM | POA: Diagnosis not present

## 2019-05-13 DIAGNOSIS — Z79890 Hormone replacement therapy (postmenopausal): Secondary | ICD-10-CM | POA: Insufficient documentation

## 2019-05-13 DIAGNOSIS — I110 Hypertensive heart disease with heart failure: Secondary | ICD-10-CM | POA: Diagnosis not present

## 2019-05-13 DIAGNOSIS — K219 Gastro-esophageal reflux disease without esophagitis: Secondary | ICD-10-CM | POA: Insufficient documentation

## 2019-05-13 DIAGNOSIS — F329 Major depressive disorder, single episode, unspecified: Secondary | ICD-10-CM | POA: Diagnosis not present

## 2019-05-13 DIAGNOSIS — G4733 Obstructive sleep apnea (adult) (pediatric): Secondary | ICD-10-CM | POA: Diagnosis not present

## 2019-05-13 DIAGNOSIS — Z7951 Long term (current) use of inhaled steroids: Secondary | ICD-10-CM | POA: Diagnosis not present

## 2019-05-13 DIAGNOSIS — I5032 Chronic diastolic (congestive) heart failure: Secondary | ICD-10-CM | POA: Insufficient documentation

## 2019-05-13 DIAGNOSIS — Z794 Long term (current) use of insulin: Secondary | ICD-10-CM | POA: Insufficient documentation

## 2019-05-13 DIAGNOSIS — Z87891 Personal history of nicotine dependence: Secondary | ICD-10-CM | POA: Diagnosis not present

## 2019-05-13 DIAGNOSIS — J410 Simple chronic bronchitis: Secondary | ICD-10-CM

## 2019-05-13 DIAGNOSIS — E119 Type 2 diabetes mellitus without complications: Secondary | ICD-10-CM

## 2019-05-13 LAB — GLUCOSE, CAPILLARY: Glucose-Capillary: 108 mg/dL — ABNORMAL HIGH (ref 70–99)

## 2019-05-13 NOTE — Patient Instructions (Addendum)
Continue weighing daily and call for an overnight weight gain of > 2 pounds or a weekly weight gain of >5 pounds.  Dr [name] March 2nd at [place]. Call his office if you need to reschedule at [phone]

## 2019-05-13 NOTE — Progress Notes (Signed)
Patient ID: [name] [name], female    DOB: 12-13-1955, 64 y.o.   MRN: [id number]  HPI  Ms [name] [name] is a 64 y/o female with a history of vitamin D deficiency, obstructive sleep apnea, GERD, pneumonia, PVD, DM, depression, DJD, COPD with oxygen, allergies, past tobacco use and chronic heart failure.   Last echo was done on 05/21/16 and showed an EF of 50% along with trivial MR/TR/PR. EF has declined slightly from June 2017.  Has not been admitted or been in the ED in the last 6 months.   She presents today for her follow-up visit with a chief complaint of minimal shortness of breath upon moderate exertion. She describes this as chronic in nature having been present for several years. She has associated light-headedness, fatigue, pedal edema and fluctuating weight along with this. She denies any difficulty sleeping, abdominal distention, palpitations, chest pain or cough.   She says that she's looking into getting gastric bypass surgery done but hasn't seen cardiology in a few years.   Past Medical History:  Diagnosis Date  . Allergic rhinitis   . Allergy   . Asthma   . Back pain   . CHF (congestive heart failure) ([place])   . COPD (chronic obstructive pulmonary disease) ([place])   . Degenerative joint disease of knee, left   . Depression   . Diabetes mellitus without complication ([place])   . Edema   . Elevated lipids   . GERD (gastroesophageal reflux disease)   . Hidradenitis   . History of colonic polyps   . Hypertension   . Hypothyroidism   . IBS (irritable bowel syndrome)   . Insomnia   . Obesity   . Oxygen dependent   . Pneumonia   . PVD (peripheral vascular disease) ([place])   . Sinusitis, chronic   . Sleep apnea   . Thyroid disease   . Vaginitis, atrophic   . Vertigo   . Vitamin D deficiency    Past Surgical History:  Procedure Laterality Date  . ABDOMINAL HYSTERECTOMY    . AXILLARY HIDRADENITIS EXCISION    . BREAST EXCISIONAL BIOPSY Bilateral 42/3536   RUPTURED FOLLICULAR CYSTS  WITH ABSCESSES AND SCARRING, CONSISTENT WITH HIDRADENITIS SUPPURATIVA.   [name] CESAREAN SECTION     x 2  . CHOLECYSTECTOMY    . HEEL SPUR EXCISION N/A   . HYDRADENITIS EXCISION Right 12/31/2015   Procedure: EXCISION HIDRADENITIS AXILLA;  Surgeon: Clayburn Pert, MD;  Location: [facility] ORS;  Service: General;  Laterality: Right;  . KNEE SURGERY Left    1998  . TONSILLECTOMY     Family History  Problem Relation Age of Onset  . Rashes / Skin problems Father   . Hypertension Father   . Heart disease Father   . Breast cancer Paternal Grandmother   . COPD Mother   . Kidney cancer Neg Hx   . Bladder Cancer Neg Hx    Social History   Tobacco Use  . Smoking status: Former Smoker    Quit date: 12/18/1993    Years since quitting: 25.4  . Smokeless tobacco: Never Used  Substance Use Topics  . Alcohol use: No   Allergies  Allergen Reactions  . Codeine Itching   Prior to Admission medications   Medication Sig Start Date End Date Taking? Authorizing Provider  albuterol (PROVENTIL HFA;VENTOLIN HFA) 108 (90 Base) MCG/ACT inhaler Inhale 2 puffs into the lungs every 6 (six) hours as needed for wheezing or shortness of breath.   Yes [provider]  albuterol (PROVENTIL) (2.5 MG/3ML) 0.083% nebulizer solution Take 2.5 mg by nebulization every 6 (six) hours as needed for wheezing or shortness of breath.    Yes [provider]  ammonium lactate (AMLACTIN) 12 % cream Apply topically as needed for dry skin.   Yes [provider]  aspirin EC 81 MG tablet Take 81 mg by mouth daily.   Yes [provider]  atorvastatin (LIPITOR) 20 MG tablet Take 20 mg by mouth at bedtime.    Yes [provider]  budesonide-formoterol (SYMBICORT) 160-4.5 MCG/ACT inhaler Inhale 2 puffs into the lungs 2 (two) times daily.   Yes [provider]  clindamycin (CLINDAGEL) 1 % gel Apply 1 application topically 2 (two) times daily. 04/05/17  Yes Clayburn Pert, MD  clobetasol  (TEMOVATE) 0.05 % external solution  03/21/18  Yes [provider]  DALIRESP 500 MCG TABS tablet Take 500 mcg by mouth daily.  03/19/17  Yes [provider]  doxepin (SINEQUAN) 50 MG capsule Take 50 mg by mouth at bedtime.   Yes [provider]  doxycycline (MONODOX) 100 MG capsule  06/19/18  Yes [provider]  Dulaglutide (TRULICITY) 9.02 IO/9.7DZ SOPN Inject into the skin once a week.   Yes [provider]  DULoxetine (CYMBALTA) 20 MG capsule Take 20 mg by mouth daily.   Yes [provider]  fluocinonide (LIDEX) 0.05 % external solution Apply 1 application topically 2 (two) times daily.   Yes [provider]  fluticasone Asencion Islam) 50 MCG/ACT nasal spray  06/24/18  Yes [provider]  gabapentin (NEURONTIN) 300 MG capsule Take 300 mg by mouth 2 (two) times daily.  03/25/17  Yes [provider]  glucose blood test strip TEST three times a day 12/30/15  Yes [provider]  HUMULIN R U-500 KWIKPEN 500 UNIT/ML injection Inject 120 Units into the skin 2 (two) times daily with a meal.    Yes [provider]  ibuprofen (ADVIL,MOTRIN) 800 MG tablet Take 1 tablet (800 mg total) by mouth every 8 (eight) hours as needed for mild pain or moderate pain (with food). 12/21/16  Yes Eula Listen, MD  ketoconazole (NIZORAL) 2 % shampoo Apply 1 application topically 2 (two) times a week.   Yes [provider]  Lancets Misc. (ACCU-CHEK FASTCLIX LANCET) KIT  12/27/15  Yes [provider]  levothyroxine (SYNTHROID, LEVOTHROID) 200 MCG tablet Take 200 mcg by mouth daily before breakfast.    Yes [provider]  LUTEIN PO Take 1 tablet by mouth daily.   Yes [provider]  meclizine (ANTIVERT) 25 MG tablet Take 25 mg by mouth 3 (three) times daily as needed for dizziness.   Yes [provider]  Melatonin 5 MG CAPS Take 2 capsules by mouth daily.   Yes [provider]   mometasone (NASONEX) 50 MCG/ACT nasal spray Place 2 sprays into both nostrils daily.    Yes [provider]  montelukast (SINGULAIR) 10 MG tablet Take 10 mg by mouth at bedtime.    Yes [provider]  nystatin (MYCOSTATIN) 100000 UNIT/ML suspension  06/04/18  Yes [provider]  olopatadine (PATANOL) 0.1 % ophthalmic solution Place 1 drop into both eyes 2 (two) times daily.   Yes [provider]  omeprazole (PRILOSEC) 40 MG capsule Take 40 mg by mouth daily.   Yes [provider]  potassium citrate (UROCIT-K) 10 MEQ (1080 MG) SR tablet Take 1 tablet (10 mEq total) by mouth every  morning. 02/28/18  Yes Kaleiyah Polsky, Otila Kluver A, FNP  spironolactone (ALDACTONE) 50 MG tablet Take 100 mg by mouth daily.    Yes [provider]  tacrolimus (PROTOPIC) 0.1 % ointment Apply topically 2 (two) times daily.   Yes [provider]  torsemide (DEMADEX) 20 MG tablet TAKE  1 TABLETS BY MOUTH daily 05/02/18  Yes Darylene Price A, FNP  traZODone (DESYREL) 50 MG tablet Take 50-100 mg by mouth at bedtime as needed for sleep.    Yes [provider]  triamcinolone cream (KENALOG) 0.1 % Apply 1 application topically 2 (two) times daily.   Yes [provider]  TRULICITY 1.5 HU/8.3FG SOPN Inject 1.5 mg into the skin once a week. 05/02/19  Yes [provider]     Review of Systems  Constitutional: Positive for fatigue. Negative for appetite change.  HENT: Negative for congestion, rhinorrhea and sore throat.   Eyes: Negative.   Respiratory: Positive for shortness of breath (with minimal exertion). Negative for cough, chest tightness and wheezing.   Cardiovascular: Positive for leg swelling (left side). Negative for chest pain and palpitations.  Gastrointestinal: Negative for abdominal distention and abdominal pain.  Endocrine: Negative.   Genitourinary: Negative.   Musculoskeletal: Negative for back pain and neck pain.  Skin: Negative.    Allergic/Immunologic: Negative.   Neurological: Positive for light-headedness (intermittent). Negative for dizziness.  Hematological: Negative for adenopathy. Does not bruise/bleed easily.  Psychiatric/Behavioral: Negative for dysphoric mood and sleep disturbance. The patient is not nervous/anxious.    Vitals:   05/13/19 1329  BP: (!) 124/49  Pulse: 87  Resp: 18  SpO2: 95%  Weight: 271 lb (122.9 kg)  Height: 5' 3"  (1.6 m)   Wt Readings from Last 3 Encounters:  05/13/19 271 lb (122.9 kg)  05/12/19 271 lb 12.8 oz (123.3 kg)  11/05/18 270 lb (122.5 kg)   Lab Results  Component Value Date   CREATININE 0.85 06/25/2017   CREATININE 0.89 12/21/2016   CREATININE 0.82 11/15/2016    Physical Exam  Constitutional: She is oriented to person, place, and time. She appears well-developed and well-nourished.  HENT:  Head: Normocephalic and atraumatic.  Neck: No JVD present.  Cardiovascular: Normal rate and regular rhythm.  Pulmonary/Chest: Effort normal. She has no wheezes. She has no rales.  Abdominal: Soft. She exhibits no distension. There is no abdominal tenderness.  Musculoskeletal:        General: Edema (1+ pitting edema in bilateral lower legs) present. No tenderness.     Cervical back: Normal range of motion and neck supple.  Neurological: She is alert and oriented to person, place, and time.  Skin: Skin is warm and dry.  Psychiatric: She has a normal mood and affect. Her behavior is normal. Thought content normal.  Nursing note and vitals reviewed.    Assessment & Plan:  1: Chronic heart failure with preserved ejection fraction- - NYHA class II - euvolemic today - weighing  Daily; reminded to call for an overnight weight gain of 2 lbs or a weekly weight gain of 5 lbs - weight stable since her last visit here 6 months ago - not adding salt and is using a salt substitute. Using meals on wheels for food; reminded her to follow a 2000 mg sodium diet - PRN follow-up with  cardiologist [name]); last seen 12/28/15; appt was made for her for 05/20/19 - discussed getting updated echo but she will wait until she sees him next week - encouraged her to increase her activity -  encouraged her to elevate her legs when sitting for long periods of time; she says edema improves in the morning after being in the bed - going to have home sleep study done (previously unable to tolerate CPAP) - says that she's received her flu vaccine for this season  2: HTN- - BP looks good today - follows with PCP at [place] from 06/25/17 reviewed and showed sodium 136, potassium 3.5 and GFR >60; she says that she's due to get her lab work drawn and that she gets it done ~ 3 months  3: Diabetes- - fasting glucose in the office was 108 - A1C on 05/21/16 was 7.0% - saw endocrinologist ([place]) 09/12/17   4: COPD- - continue inhalers  - wears oxygen at 2L when at home - saw pulmonologist Elie Confer) 05/12/19  Patient did not bring her medications nor a list. Each medication was verbally reviewed with the patient and she was encouraged to bring the bottles to every visit to confirm accuracy of list.  Return in 6 months or sooner for any questions/ problems before then.

## 2019-05-14 ENCOUNTER — Telehealth: Payer: Self-pay | Admitting: Family

## 2019-05-14 DIAGNOSIS — I5032 Chronic diastolic (congestive) heart failure: Secondary | ICD-10-CM

## 2019-05-14 NOTE — Telephone Encounter (Signed)
Saw patient in the [facility] yesterday (05/13/19) and said that she is looking into gastric bypass surgery. Explained that if she pursued that she would need cardiology clearance from her cardiologist, Dr [name], who she has not seen since October 2017. Offered to make her an appointment with him, per her request, and this was scheduled for 05/20/19.   Also noticed that her last echocardiogram was March 2018 and offered to schedule that but she wanted to wait until she saw Dr. [name].   Patient called back today (05/14/19) asking if there was a different cardiology group that she could go to. She says that she's trying to simplify her care and would like to have all her care providers "part of the same group" & that it gets too confusing seeing multiple providers from different places. Explained that Dr. [name] did have access to her [facility] records/ visits but she requests to have a referral sent to [place].   Advised patient that I would send the referral but that she needed to make sure she cancelled the appointment with Dr. [name]. Patient verbalized understanding

## 2019-05-16 ENCOUNTER — Ambulatory Visit (INDEPENDENT_AMBULATORY_CARE_PROVIDER_SITE_OTHER): Payer: Medicare Other | Admitting: Cardiology

## 2019-05-16 ENCOUNTER — Encounter: Payer: Self-pay | Admitting: Cardiology

## 2019-05-16 ENCOUNTER — Other Ambulatory Visit: Payer: Self-pay

## 2019-05-16 VITALS — BP 112/60 | HR 79 | Ht 63.0 in | Wt 271.0 lb

## 2019-05-16 DIAGNOSIS — R0600 Dyspnea, unspecified: Secondary | ICD-10-CM | POA: Diagnosis not present

## 2019-05-16 DIAGNOSIS — Z01810 Encounter for preprocedural cardiovascular examination: Secondary | ICD-10-CM | POA: Diagnosis not present

## 2019-05-16 DIAGNOSIS — R0609 Other forms of dyspnea: Secondary | ICD-10-CM

## 2019-05-16 DIAGNOSIS — E7800 Pure hypercholesterolemia, unspecified: Secondary | ICD-10-CM

## 2019-05-16 NOTE — Patient Instructions (Addendum)
Medication Instructions:  Your physician recommends that you continue on your current medications as directed. Please refer to the Current Medication list given to you today.  *If you need a refill on your cardiac medications before your next appointment, please call your pharmacy*   Lab Work: none If you have labs (blood work) drawn today and your tests are completely normal, you will receive your results only by: [name] MyChart Message (if you have MyChart) OR . A paper copy in the mail If you have any lab test that is abnormal or we need to change your treatment, we will call you to review the results.   Testing/Procedures: 1- ECHOCARDIOGRAM - Your physician has requested that you have an echocardiogram. Echocardiography is a painless test that uses sound waves to create images of your heart. It provides your doctor with information about the size and shape of your heart and how well your heart's chambers and valves are working. This procedure takes approximately one hour. There are no restrictions for this procedure. You may get an IV, if needed, to receive an ultrasound enhancing agent through to better visualize your heart.   2- Your physician has requested that you have a lexiscan myoview. For further information please visit [email or web address]. Please follow instruction sheet, as given.  [place]  Your caregiver has ordered a Stress Test with nuclear imaging. The purpose of this test is to evaluate the blood supply to your heart muscle. This procedure is referred to as a "Non-Invasive Stress Test." This is because other than having an IV started in your vein, nothing is inserted or "invades" your body. Cardiac stress tests are done to find areas of poor blood flow to the heart by determining the extent of coronary artery disease (CAD). Some patients exercise on a treadmill, which naturally increases the blood flow to your heart, while others who are  unable to walk on a treadmill due to  physical limitations have a pharmacologic/chemical stress agent called Lexiscan . This medicine will mimic walking on a treadmill by temporarily increasing your coronary blood flow.   Please note: these test may take anywhere between 2-4 hours to complete  PLEASE REPORT TO [place] AT THE FIRST DESK WILL DIRECT YOU WHERE TO GO  Date of Procedure:_____________________________________  Arrival Time for Procedure:______________________________  Instructions regarding medication:   _XX_ : Hold diabetes medication morning of procedure  _XX_:  Hold other medications as follows:___________TORSEMIDE_____________________  PLEASE NOTIFY THE OFFICE AT LEAST 24 HOURS IN ADVANCE IF YOU ARE UNABLE TO [place].  [phone] AND  PLEASE NOTIFY NUCLEAR MEDICINE AT [facility] AT LEAST 24 HOURS IN ADVANCE IF YOU ARE UNABLE TO KEEP YOUR APPOINTMENT. [phone]  How to prepare for your Myoview test:  1. Do not eat or drink after midnight 2. No caffeine for 24 hours prior to test 3. No smoking 24 hours prior to test. 4. Your medication may be taken with water.  If your doctor stopped a medication because of this test, do not take that medication. 5. Ladies, please do not wear dresses.  Skirts or pants are appropriate. Please wear a short sleeve shirt. 6. No perfume, cologne or lotion. 7. Wear comfortable walking shoes.  Follow-Up: At [facility], you and your health needs are our priority.  As part of our continuing mission to provide you with exceptional heart care, we have created designated Provider Care Teams.  These Care Teams include your primary Cardiologist (physician) and Advanced Practice  Providers (APPs -  Physician Assistants and Nurse Practitioners) who all work together to provide you with the care you need, when you need it.  We recommend signing up for the patient portal called "MyChart".  Sign up information is provided on this After Visit  Summary.  MyChart is used to connect with patients for Virtual Visits (Telemedicine).  Patients are able to view lab/test results, encounter notes, upcoming appointments, etc.  Non-urgent messages can be sent to your provider as well.   To learn more about what you can do with MyChart, go to [email or web address].    Your next appointment:   AFTER TESTING  The format for your next appointment:   In Person  Provider:   Kate Sable, MD   Cardiac Nuclear Scan A cardiac nuclear scan is a test that measures blood flow to the heart when a person is resting and when he or she is exercising. The test looks for problems such as:  Not enough blood reaching a portion of the heart.  The heart muscle not working normally. You may need this test if:  You have heart disease.  You have had abnormal lab results.  You have had heart surgery or a balloon procedure to open up blocked arteries (angioplasty).  You have chest pain.  You have shortness of breath. In this test, a radioactive dye (tracer) is injected into your bloodstream. After the tracer has traveled to your heart, an imaging device is used to measure how much of the tracer is absorbed by or distributed to various areas of your heart. This procedure is usually done at a hospital and takes 2-4 hours. Tell a health care provider about:  Any allergies you have.  All medicines you are taking, including vitamins, herbs, eye drops, creams, and over-the-counter medicines.  Any problems you or family members have had with anesthetic medicines.  Any blood disorders you have.  Any surgeries you have had.  Any medical conditions you have.  Whether you are pregnant or may be pregnant. What are the risks? Generally, this is a safe procedure. However, problems may occur, including:  Serious chest pain and heart attack. This is only a risk if the stress portion of the test is done.  Rapid heartbeat.  Sensation of warmth in your  chest. This usually passes quickly.  Allergic reaction to the tracer. What happens before the procedure?  Ask your health care provider about changing or stopping your regular medicines. This is especially important if you are taking diabetes medicines or blood thinners.  Follow instructions from your health care provider about eating or drinking restrictions.  Remove your jewelry on the day of the procedure. What happens during the procedure?  An IV will be inserted into one of your veins.  Your health care provider will inject a small amount of radioactive tracer through the IV.  You will wait for 20-40 minutes while the tracer travels through your bloodstream.  Your heart activity will be monitored with an electrocardiogram (ECG).  You will lie down on an exam table.  Images of your heart will be taken for about 15-20 minutes.  You may also have a stress test. For this test, one of the following may be done: ? You will exercise on a treadmill or stationary bike. While you exercise, your heart's activity will be monitored with an ECG, and your blood pressure will be checked. ? You will be given medicines that will increase blood flow to parts of your heart. This  is done if you are unable to exercise.  When blood flow to your heart has peaked, a tracer will again be injected through the IV.  After 20-40 minutes, you will get back on the exam table and have more images taken of your heart.  Depending on the type of tracer used, scans may need to be repeated 3-4 hours later.  Your IV line will be removed when the procedure is over. The procedure may vary among health care providers and hospitals. What happens after the procedure?  Unless your health care provider tells you otherwise, you may return to your normal schedule, including diet, activities, and medicines.  Unless your health care provider tells you otherwise, you may increase your fluid intake. This will help to flush  the contrast dye from your body. Drink enough fluid to keep your urine pale yellow.  Ask your health care provider, or the department that is doing the test: ? When will my results be ready? ? How will I get my results? Summary  A cardiac nuclear scan measures the blood flow to the heart when a person is resting and when he or she is exercising.  Tell your health care provider if you are pregnant.  Before the procedure, ask your health care provider about changing or stopping your regular medicines. This is especially important if you are taking diabetes medicines or blood thinners.  After the procedure, unless your health care provider tells you otherwise, increase your fluid intake. This will help flush the contrast dye from your body.  After the procedure, unless your health care provider tells you otherwise, you may return to your normal schedule, including diet, activities, and medicines. This information is not intended to replace advice given to you by your health care provider. Make sure you discuss any questions you have with your health care provider. Document Revised: 08/20/2017 Document Reviewed: 08/20/2017 Elsevier Patient Education  [place].   Echocardiogram An echocardiogram is a procedure that uses painless sound waves (ultrasound) to produce an image of the heart. Images from an echocardiogram can provide important information about:  Signs of coronary artery disease (CAD).  Aneurysm detection. An aneurysm is a weak or damaged part of an artery wall that bulges out from the normal force of blood pumping through the body.  Heart size and shape. Changes in the size or shape of the heart can be associated with certain conditions, including heart failure, aneurysm, and CAD.  Heart muscle function.  Heart valve function.  Signs of a past heart attack.  Fluid buildup around the heart.  Thickening of the heart muscle.  A tumor or infectious growth around  the heart valves. Tell a health care provider about:  Any allergies you have.  All medicines you are taking, including vitamins, herbs, eye drops, creams, and over-the-counter medicines.  Any blood disorders you have.  Any surgeries you have had.  Any medical conditions you have.  Whether you are pregnant or may be pregnant. What are the risks? Generally, this is a safe procedure. However, problems may occur, including:  Allergic reaction to dye (contrast) that may be used during the procedure. What happens before the procedure? No specific preparation is needed. You may eat and drink normally. What happens during the procedure?   An IV tube may be inserted into one of your veins.  You may receive contrast through this tube. A contrast is an injection that improves the quality of the pictures from your heart.  A  gel will be applied to your chest.  A wand-like tool (transducer) will be moved over your chest. The gel will help to transmit the sound waves from the transducer.  The sound waves will harmlessly bounce off of your heart to allow the heart images to be captured in real-time motion. The images will be recorded on a computer. The procedure may vary among health care providers and hospitals. What happens after the procedure?  You may return to your normal, everyday life, including diet, activities, and medicines, unless your health care provider tells you not to do that. Summary  An echocardiogram is a procedure that uses painless sound waves (ultrasound) to produce an image of the heart.  Images from an echocardiogram can provide important information about the size and shape of your heart, heart muscle function, heart valve function, and fluid buildup around your heart.  You do not need to do anything to prepare before this procedure. You may eat and drink normally.  After the echocardiogram is completed, you may return to your normal, everyday life, unless your  health care provider tells you not to do that. This information is not intended to replace advice given to you by your health care provider. Make sure you discuss any questions you have with your health care provider. Document Revised: 06/27/2018 Document Reviewed: 04/08/2016 Elsevier Patient Education  [place].

## 2019-05-16 NOTE — Progress Notes (Signed)
Cardiology Office Note:    Date:  05/16/2019   ID:  [name] [name], DOB 1955-12-29, MRN [id number]  PCP:  [name] Lank, MD  Cardiologist:  [name] Sable, MD  Electrophysiologist:  None   Referring MD: [name] Lank, MD   Chief Complaint  Patient presents with  . New Patient (Initial Visit)    Referred by [name] [name] for CHF. Meds reviewed verbally with patient.     History of Present Illness:    [name] [name] is a 64 y.o. female with a hx of COPD, diabetes, CHF, morbid obesity presents due to history of CHF and presurgical eval.  Patient is morbidly obese and is considering gastric bypass.  She denies chest pain with exertion but states being short of breath which she attributes to COPD.  She also endorses lower extremity edema for which she takes torsemide.  She denies any history of heart disease but states her father and sister passed from heart attacks.  Last echocardiogram was about 3 years ago with normal ejection fraction.  Past Medical History:  Diagnosis Date  . Allergic rhinitis   . Allergy   . Asthma   . Back pain   . CHF (congestive heart failure) ([place])   . COPD (chronic obstructive pulmonary disease) ([place])   . Degenerative joint disease of knee, left   . Depression   . Diabetes mellitus without complication ([place])   . Edema   . Elevated lipids   . GERD (gastroesophageal reflux disease)   . Hidradenitis   . History of colonic polyps   . Hypertension   . Hypothyroidism   . IBS (irritable bowel syndrome)   . Insomnia   . Obesity   . Oxygen dependent   . Pneumonia   . PVD (peripheral vascular disease) ([place])   . Sinusitis, chronic   . Sleep apnea   . Thyroid disease   . Vaginitis, atrophic   . Vertigo   . Vitamin D deficiency     Past Surgical History:  Procedure Laterality Date  . ABDOMINAL HYSTERECTOMY    . AXILLARY HIDRADENITIS EXCISION    . BREAST EXCISIONAL BIOPSY Bilateral 70/9628   RUPTURED FOLLICULAR CYSTS WITH ABSCESSES AND SCARRING,  CONSISTENT WITH HIDRADENITIS SUPPURATIVA.   [name] CESAREAN SECTION     x 2  . CHOLECYSTECTOMY    . HEEL SPUR EXCISION N/A   . HYDRADENITIS EXCISION Right 12/31/2015   Procedure: EXCISION HIDRADENITIS AXILLA;  Surgeon: Clayburn Pert, MD;  Location: [facility] ORS;  Service: General;  Laterality: Right;  . KNEE SURGERY Left    1998  . TONSILLECTOMY      Current Medications: Current Meds  Medication Sig  . albuterol (PROVENTIL HFA;VENTOLIN HFA) 108 (90 Base) MCG/ACT inhaler Inhale 2 puffs into the lungs every 6 (six) hours as needed for wheezing or shortness of breath.  [name] albuterol (PROVENTIL) (2.5 MG/3ML) 0.083% nebulizer solution Take 2.5 mg by nebulization every 6 (six) hours as needed for wheezing or shortness of breath.   [name] ammonium lactate (AMLACTIN) 12 % cream Apply topically as needed for dry skin.  [name] aspirin EC 81 MG tablet Take 81 mg by mouth daily.  [name] atorvastatin (LIPITOR) 20 MG tablet Take 20 mg by mouth at bedtime.   . budesonide-formoterol (SYMBICORT) 160-4.5 MCG/ACT inhaler Inhale 2 puffs into the lungs 2 (two) times daily.  . clindamycin (CLINDAGEL) 1 % gel Apply 1 application topically 2 (two) times daily.  . clobetasol (TEMOVATE) 0.05 % external solution   . DALIRESP 500  MCG TABS tablet Take 500 mcg by mouth daily.   [name] doxepin (SINEQUAN) 50 MG capsule Take 50 mg by mouth at bedtime.  [name] doxycycline (MONODOX) 100 MG capsule   . Dulaglutide (TRULICITY) 0.10 OF/1.2RF SOPN Inject into the skin once a week.  . DULoxetine (CYMBALTA) 20 MG capsule Take 20 mg by mouth daily.  . fluocinonide (LIDEX) 0.05 % external solution Apply 1 application topically 2 (two) times daily.  . fluticasone (FLONASE) 50 MCG/ACT nasal spray   . gabapentin (NEURONTIN) 300 MG capsule Take 300 mg by mouth 2 (two) times daily.   [name] glucose blood test strip TEST three times a day  . HUMULIN R U-500 KWIKPEN 500 UNIT/ML injection Inject 120 Units into the skin 2 (two) times daily with a meal.   . ibuprofen  (ADVIL,MOTRIN) 800 MG tablet Take 1 tablet (800 mg total) by mouth every 8 (eight) hours as needed for mild pain or moderate pain (with food).  [name] ketoconazole (NIZORAL) 2 % shampoo Apply 1 application topically 2 (two) times a week.  . Lancets Misc. (ACCU-CHEK FASTCLIX LANCET) KIT   . levothyroxine (SYNTHROID, LEVOTHROID) 200 MCG tablet Take 200 mcg by mouth daily before breakfast.   . LUTEIN PO Take 1 tablet by mouth daily.  . meclizine (ANTIVERT) 25 MG tablet Take 25 mg by mouth 3 (three) times daily as needed for dizziness.  . Melatonin 5 MG CAPS Take 2 capsules by mouth daily.  . mometasone (NASONEX) 50 MCG/ACT nasal spray Place 2 sprays into both nostrils daily.   . montelukast (SINGULAIR) 10 MG tablet Take 10 mg by mouth at bedtime.   [name] nystatin (MYCOSTATIN) 100000 UNIT/ML suspension   . olopatadine (PATANOL) 0.1 % ophthalmic solution Place 1 drop into both eyes 2 (two) times daily.  [name] omeprazole (PRILOSEC) 40 MG capsule Take 40 mg by mouth daily.  . potassium citrate (UROCIT-K) 10 MEQ (1080 MG) SR tablet Take 1 tablet (10 mEq total) by mouth every morning.  [name] spironolactone (ALDACTONE) 50 MG tablet Take 100 mg by mouth daily.   . tacrolimus (PROTOPIC) 0.1 % ointment Apply topically 2 (two) times daily.  [name] torsemide (DEMADEX) 20 MG tablet TAKE  1 TABLETS BY MOUTH daily  . traZODone (DESYREL) 50 MG tablet Take 50-100 mg by mouth at bedtime as needed for sleep.   [name] triamcinolone cream (KENALOG) 0.1 % Apply 1 application topically 2 (two) times daily.  . TRULICITY 1.5 XJ/8.8TG SOPN Inject 1.5 mg into the skin once a week.     Allergies:   Codeine   Social History   Socioeconomic History  . Marital status: Divorced    Spouse name: Not on file  . Number of children: Not on file  . Years of education: Not on file  . Highest education level: Not on file  Occupational History  . Occupation: disabled  Tobacco Use  . Smoking status: Former Smoker    Quit date: 12/18/1993    Years since  quitting: 25.4  . Smokeless tobacco: Never Used  Substance and Sexual Activity  . Alcohol use: No  . Drug use: No  . Sexual activity: Never    Birth control/protection: Abstinence  Other Topics Concern  . Not on file  Social History Narrative  . Not on file   Social Determinants of Health   Financial Resource Strain:   . Difficulty of Paying Living Expenses: Not on file  Food Insecurity:   . Worried About Charity fundraiser in the Last  Year: Not on file  . Ran Out of Food in the Last Year: Not on file  Transportation Needs:   . Lack of Transportation (Medical): Not on file  . Lack of Transportation (Non-Medical): Not on file  Physical Activity:   . Days of Exercise per Week: Not on file  . Minutes of Exercise per Session: Not on file  Stress:   . Feeling of Stress : Not on file  Social Connections:   . Frequency of Communication with Friends and Family: Not on file  . Frequency of Social Gatherings with Friends and Family: Not on file  . Attends Religious Services: Not on file  . Active Member of Clubs or Organizations: Not on file  . Attends Archivist Meetings: Not on file  . Marital Status: Not on file     Family History: The patient's family history includes Breast cancer in her paternal grandmother; COPD in her mother; Heart disease in her father; Hypertension in her father; Rashes / Skin problems in her father. There is no history of Kidney cancer or Bladder Cancer.  ROS:   Please see the history of present illness.     All other systems reviewed and are negative.  EKGs/Labs/Other Studies Reviewed:    The following studies were reviewed today:   EKG:  EKG is  ordered today.  The ekg ordered today demonstrates sinus rhythm, right bundle branch block, left anterior fascicular block.  Recent Labs: No results found for requested labs within last 8760 hours.  Recent Lipid Panel    Component Value Date/Time   CHOL 107 09/18/2012 0622   TRIG 282 (H)  09/18/2012 0622   HDL 29 (L) 09/18/2012 0622   VLDL 56 (H) 09/18/2012 0622   LDLCALC 22 09/18/2012 0622    Physical Exam:    VS:  BP 112/60 (BP Location: Right Arm, Patient Position: Sitting, Cuff Size: Normal)   Pulse 79   Ht 5' 3"  (1.6 m)   Wt 271 lb (122.9 kg)   SpO2 96%   BMI 48.01 kg/m     Wt Readings from Last 3 Encounters:  05/16/19 271 lb (122.9 kg)  05/13/19 271 lb (122.9 kg)  05/12/19 271 lb 12.8 oz (123.3 kg)     GEN:  Well nourished, well developed in no acute distress, obese HEENT: Normal NECK: No JVD; No carotid bruits LYMPHATICS: No lymphadenopathy CARDIAC: RRR, no murmurs, rubs, gallops RESPIRATORY:  Clear to auscultation without rales, wheezing or rhonchi  ABDOMEN: Soft, non-tender, distended MUSCULOSKELETAL:  1+ edema; No deformity  SKIN: Warm and dry NEUROLOGIC:  Alert and oriented x 3 PSYCHIATRIC:  Normal affect   ASSESSMENT:    1. Dyspnea on exertion   2. Morbid obesity ([place])   3. Preoperative cardiovascular examination   4. Pure hypercholesterolemia    PLAN:    In order of problems listed above:  1. Patient with history of dyspnea on exertion.  Echocardiogram 2 to 3 years ago showed normal EF.  Her dyspnea on exertion could be multifactorial including OSA, COPD, morbid obesity.  Will evaluate with echocardiogram to rule out any structural abnormalities.  We will also get a myocardial perfusion imaging stress test to rule out any ischemia in light of patient planning bypass surgery. 2. Patient is morbidly obese.  Continue low calorie diet.  Bypass planning underway. 3. History of hyperlipidemia, continue statin as prescribed.  Follow-up after echocardiogram and stress test.  This note was generated in part or whole with voice recognition software.  Voice recognition is usually quite accurate but there are transcription errors that can and very often do occur. I apologize for any typographical errors that were not detected and  corrected.  Medication Adjustments/Labs and Tests Ordered: Current medicines are reviewed at length with the patient today.  Concerns regarding medicines are outlined above.  Orders Placed This Encounter  Procedures  . NM Myocar Multi W/Spect W/Wall Motion / EF  . EKG 12-Lead  . ECHOCARDIOGRAM COMPLETE   No orders of the defined types were placed in this encounter.   Patient Instructions  Medication Instructions:  Your physician recommends that you continue on your current medications as directed. Please refer to the Current Medication list given to you today.  *If you need a refill on your cardiac medications before your next appointment, please call your pharmacy*   Lab Work: none If you have labs (blood work) drawn today and your tests are completely normal, you will receive your results only by: [name] MyChart Message (if you have MyChart) OR . A paper copy in the mail If you have any lab test that is abnormal or we need to change your treatment, we will call you to review the results.   Testing/Procedures: 1- ECHOCARDIOGRAM - Your physician has requested that you have an echocardiogram. Echocardiography is a painless test that uses sound waves to create images of your heart. It provides your doctor with information about the size and shape of your heart and how well your heart's chambers and valves are working. This procedure takes approximately one hour. There are no restrictions for this procedure. You may get an IV, if needed, to receive an ultrasound enhancing agent through to better visualize your heart.   2- Your physician has requested that you have a lexiscan myoview. For further information please visit [email or web address]. Please follow instruction sheet, as given.  [place]  Your caregiver has ordered a Stress Test with nuclear imaging. The purpose of this test is to evaluate the blood supply to your heart muscle. This procedure is referred to as a "Non-Invasive  Stress Test." This is because other than having an IV started in your vein, nothing is inserted or "invades" your body. Cardiac stress tests are done to find areas of poor blood flow to the heart by determining the extent of coronary artery disease (CAD). Some patients exercise on a treadmill, which naturally increases the blood flow to your heart, while others who are  unable to walk on a treadmill due to physical limitations have a pharmacologic/chemical stress agent called Lexiscan . This medicine will mimic walking on a treadmill by temporarily increasing your coronary blood flow.   Please note: these test may take anywhere between 2-4 hours to complete  PLEASE REPORT TO [place] AT THE FIRST DESK WILL DIRECT YOU WHERE TO GO  Date of Procedure:_____________________________________  Arrival Time for Procedure:______________________________  Instructions regarding medication:   _XX_ : Hold diabetes medication morning of procedure  _XX_:  Hold other medications as follows:___________TORSEMIDE_____________________  PLEASE NOTIFY THE OFFICE AT LEAST 24 HOURS IN ADVANCE IF YOU ARE UNABLE TO [place].  [phone] AND  PLEASE NOTIFY NUCLEAR MEDICINE AT [facility] AT LEAST 24 HOURS IN ADVANCE IF YOU ARE UNABLE TO KEEP YOUR APPOINTMENT. [phone]  How to prepare for your Myoview test:  1. Do not eat or drink after midnight 2. No caffeine for 24 hours prior to test 3. No smoking 24 hours prior to test. 4. Your medication  may be taken with water.  If your doctor stopped a medication because of this test, do not take that medication. 5. Ladies, please do not wear dresses.  Skirts or pants are appropriate. Please wear a short sleeve shirt. 6. No perfume, cologne or lotion. 7. Wear comfortable walking shoes.  Follow-Up: At [facility], you and your health needs are our priority.  As part of our continuing mission to provide you with exceptional  heart care, we have created designated Provider Care Teams.  These Care Teams include your primary Cardiologist (physician) and Advanced Practice Providers (APPs -  Physician Assistants and Nurse Practitioners) who all work together to provide you with the care you need, when you need it.  We recommend signing up for the patient portal called "MyChart".  Sign up information is provided on this After Visit Summary.  MyChart is used to connect with patients for Virtual Visits (Telemedicine).  Patients are able to view lab/test results, encounter notes, upcoming appointments, etc.  Non-urgent messages can be sent to your provider as well.   To learn more about what you can do with MyChart, go to [email or web address].    Your next appointment:   AFTER TESTING  The format for your next appointment:   In Person  Provider:   Kate Sable, MD   Cardiac Nuclear Scan A cardiac nuclear scan is a test that measures blood flow to the heart when a person is resting and when he or she is exercising. The test looks for problems such as:  Not enough blood reaching a portion of the heart.  The heart muscle not working normally. You may need this test if:  You have heart disease.  You have had abnormal lab results.  You have had heart surgery or a balloon procedure to open up blocked arteries (angioplasty).  You have chest pain.  You have shortness of breath. In this test, a radioactive dye (tracer) is injected into your bloodstream. After the tracer has traveled to your heart, an imaging device is used to measure how much of the tracer is absorbed by or distributed to various areas of your heart. This procedure is usually done at a hospital and takes 2-4 hours. Tell a health care provider about:  Any allergies you have.  All medicines you are taking, including vitamins, herbs, eye drops, creams, and over-the-counter medicines.  Any problems you or family members have had with anesthetic  medicines.  Any blood disorders you have.  Any surgeries you have had.  Any medical conditions you have.  Whether you are pregnant or may be pregnant. What are the risks? Generally, this is a safe procedure. However, problems may occur, including:  Serious chest pain and heart attack. This is only a risk if the stress portion of the test is done.  Rapid heartbeat.  Sensation of warmth in your chest. This usually passes quickly.  Allergic reaction to the tracer. What happens before the procedure?  Ask your health care provider about changing or stopping your regular medicines. This is especially important if you are taking diabetes medicines or blood thinners.  Follow instructions from your health care provider about eating or drinking restrictions.  Remove your jewelry on the day of the procedure. What happens during the procedure?  An IV will be inserted into one of your veins.  Your health care provider will inject a small amount of radioactive tracer through the IV.  You will wait for 20-40 minutes while the tracer travels through your  bloodstream.  Your heart activity will be monitored with an electrocardiogram (ECG).  You will lie down on an exam table.  Images of your heart will be taken for about 15-20 minutes.  You may also have a stress test. For this test, one of the following may be done: ? You will exercise on a treadmill or stationary bike. While you exercise, your heart's activity will be monitored with an ECG, and your blood pressure will be checked. ? You will be given medicines that will increase blood flow to parts of your heart. This is done if you are unable to exercise.  When blood flow to your heart has peaked, a tracer will again be injected through the IV.  After 20-40 minutes, you will get back on the exam table and have more images taken of your heart.  Depending on the type of tracer used, scans may need to be repeated 3-4 hours  later.  Your IV line will be removed when the procedure is over. The procedure may vary among health care providers and hospitals. What happens after the procedure?  Unless your health care provider tells you otherwise, you may return to your normal schedule, including diet, activities, and medicines.  Unless your health care provider tells you otherwise, you may increase your fluid intake. This will help to flush the contrast dye from your body. Drink enough fluid to keep your urine pale yellow.  Ask your health care provider, or the department that is doing the test: ? When will my results be ready? ? How will I get my results? Summary  A cardiac nuclear scan measures the blood flow to the heart when a person is resting and when he or she is exercising.  Tell your health care provider if you are pregnant.  Before the procedure, ask your health care provider about changing or stopping your regular medicines. This is especially important if you are taking diabetes medicines or blood thinners.  After the procedure, unless your health care provider tells you otherwise, increase your fluid intake. This will help flush the contrast dye from your body.  After the procedure, unless your health care provider tells you otherwise, you may return to your normal schedule, including diet, activities, and medicines. This information is not intended to replace advice given to you by your health care provider. Make sure you discuss any questions you have with your health care provider. Document Revised: 08/20/2017 Document Reviewed: 08/20/2017 Elsevier Patient Education  [place].   Echocardiogram An echocardiogram is a procedure that uses painless sound waves (ultrasound) to produce an image of the heart. Images from an echocardiogram can provide important information about:  Signs of coronary artery disease (CAD).  Aneurysm detection. An aneurysm is a weak or damaged part of an artery  wall that bulges out from the normal force of blood pumping through the body.  Heart size and shape. Changes in the size or shape of the heart can be associated with certain conditions, including heart failure, aneurysm, and CAD.  Heart muscle function.  Heart valve function.  Signs of a past heart attack.  Fluid buildup around the heart.  Thickening of the heart muscle.  A tumor or infectious growth around the heart valves. Tell a health care provider about:  Any allergies you have.  All medicines you are taking, including vitamins, herbs, eye drops, creams, and over-the-counter medicines.  Any blood disorders you have.  Any surgeries you have had.  Any medical conditions you  have.  Whether you are pregnant or may be pregnant. What are the risks? Generally, this is a safe procedure. However, problems may occur, including:  Allergic reaction to dye (contrast) that may be used during the procedure. What happens before the procedure? No specific preparation is needed. You may eat and drink normally. What happens during the procedure?   An IV tube may be inserted into one of your veins.  You may receive contrast through this tube. A contrast is an injection that improves the quality of the pictures from your heart.  A gel will be applied to your chest.  A wand-like tool (transducer) will be moved over your chest. The gel will help to transmit the sound waves from the transducer.  The sound waves will harmlessly bounce off of your heart to allow the heart images to be captured in real-time motion. The images will be recorded on a computer. The procedure may vary among health care providers and hospitals. What happens after the procedure?  You may return to your normal, everyday life, including diet, activities, and medicines, unless your health care provider tells you not to do that. Summary  An echocardiogram is a procedure that uses painless sound waves (ultrasound)  to produce an image of the heart.  Images from an echocardiogram can provide important information about the size and shape of your heart, heart muscle function, heart valve function, and fluid buildup around your heart.  You do not need to do anything to prepare before this procedure. You may eat and drink normally.  After the echocardiogram is completed, you may return to your normal, everyday life, unless your health care provider tells you not to do that. This information is not intended to replace advice given to you by your health care provider. Make sure you discuss any questions you have with your health care provider. Document Revised: 06/27/2018 Document Reviewed: 04/08/2016 Elsevier Patient Education  2020 [place], [name] Sable, MD  05/16/2019 5:25 PM    [facility]

## 2019-05-26 ENCOUNTER — Ambulatory Visit
Admission: RE | Admit: 2019-05-26 | Discharge: 2019-05-26 | Disposition: A | Discharge status: Home / Self Care | Payer: Medicare Other | Source: Ambulatory Visit | Attending: Cardiology | Admitting: Cardiology

## 2019-05-26 ENCOUNTER — Other Ambulatory Visit: Payer: Self-pay

## 2019-05-26 DIAGNOSIS — R0600 Dyspnea, unspecified: Secondary | ICD-10-CM | POA: Diagnosis present

## 2019-05-26 DIAGNOSIS — Z01810 Encounter for preprocedural cardiovascular examination: Secondary | ICD-10-CM | POA: Diagnosis present

## 2019-05-26 DIAGNOSIS — R0609 Other forms of dyspnea: Secondary | ICD-10-CM

## 2019-05-26 LAB — NM MYOCAR MULTI W/SPECT W/WALL MOTION / EF
LV dias vol: 62 mL (ref 46–106)
LV sys vol: 22 mL
Peak HR: 96 {beats}/min
Percent HR: 60 %
Rest HR: 79 {beats}/min
SDS: 5
SRS: 2
SSS: 2
TID: 1

## 2019-05-26 MED ORDER — TECHNETIUM TC 99M TETROFOSMIN IV KIT
10.0000 | PACK | Freq: Once | INTRAVENOUS | Status: AC | PRN
  Administered 2019-05-26: 09:00:00 9.892 via INTRAVENOUS

## 2019-05-26 MED ORDER — REGADENOSON 0.4 MG/5ML IV SOLN
0.4000 mg | Freq: Once | INTRAVENOUS | Status: AC
  Administered 2019-05-26: 0.4 mg via INTRAVENOUS

## 2019-05-26 MED ORDER — TECHNETIUM TC 99M TETROFOSMIN IV KIT
29.7160 | PACK | Freq: Once | INTRAVENOUS | Status: AC | PRN
  Administered 2019-05-26: 29.716 via INTRAVENOUS

## 2019-05-29 ENCOUNTER — Telehealth: Payer: Self-pay | Admitting: Internal Medicine

## 2019-05-29 DIAGNOSIS — G4733 Obstructive sleep apnea (adult) (pediatric): Secondary | ICD-10-CM

## 2019-05-29 NOTE — Telephone Encounter (Signed)
I called the patient today to see if she was ready to schedule the home sleep study that Dr. Mortimer Fries had ordered for her.  She was under the impression that we were bringing the HST machine to her. I tried explained to her that she would have to pick up the machine here in [place] and return it the next day.  She stated that she couldn't do that because when driving in unfamiliar places she gets turned around. I ask if there was anyone else that could pick up the machine for her and she stated no it was just her.  Then she stated that her daughter works long hours and just got her children back into school. I told her the only other option would to do the in lab sleep study.  Then she stated that she just got a perm in her hair and really didn't want to do that either but she could at least drive to [facility].  Please advise what to do next

## 2019-05-29 NOTE — Telephone Encounter (Signed)
Dr. Mortimer Fries please see below message and advise.   Would in lab sleep be okay to order?

## 2019-05-29 NOTE — Telephone Encounter (Signed)
[place] for in lab sleep study

## 2019-05-30 DIAGNOSIS — L719 Rosacea, unspecified: Secondary | ICD-10-CM

## 2019-05-30 NOTE — Telephone Encounter (Signed)
Lm for pt

## 2019-06-02 NOTE — Telephone Encounter (Signed)
Lm x2 for pt.  

## 2019-06-02 NOTE — Telephone Encounter (Signed)
Pt was returning call 

## 2019-06-02 NOTE — Telephone Encounter (Signed)
Called and spoke to pt and relayed below message.  Pt is agreeable to in lab sleep study.   Dr. Mortimer Fries please advise on type of sleep study? Split night?

## 2019-06-04 ENCOUNTER — Telehealth: Payer: Self-pay | Admitting: Internal Medicine

## 2019-06-04 NOTE — Telephone Encounter (Signed)
Patient is requesting to decline her HST.  Patient stated she did not want to go to [facility] to pick it up and bring it back.  Message routed to [facility]

## 2019-06-05 NOTE — Telephone Encounter (Signed)
Please see note from 3/11. Dr. Mortimer Fries is willing to put in an order for split night sleep study. I just got off the phone with the patient and she is now stating that she doesn't want to do an in lab sleep study because she will not sleep there and she doesn't want that old goop in her hair.  She is determined that someone there in [place] can arrange for her to pick up the HST machine for her to do the sleep study at home. I again explained that our computer system in [place] has crashed and she would still need to pick up the machine in [place]. She states that she gets turned around when driving in unfamiliar areas.  She stated that if she can't pick up the HST machine in [place] then it will have to be put off until the sleep machine in [place] is fixed or until she can do the home sleep study from some other location in [place], [place].

## 2019-06-05 NOTE — Telephone Encounter (Signed)
Noted by triage, thank you.  Per 3.11.21 phone note, Dr Mortimer Fries wants patient to have split night study and has routed this to the [facility] triage pool to order and coordinate.  Not sure what further is needed from this office. Spoke with Rodena Piety who agrees  Will sign off

## 2019-06-05 NOTE — Telephone Encounter (Signed)
Spoke with patient.  She will do in lab sleep study  Order placed Nothing further needed at this time.

## 2019-06-05 NOTE — Telephone Encounter (Signed)
Split night sleep study will be needed

## 2019-06-05 NOTE — Telephone Encounter (Signed)
[name] [name] has pulled pt's order to call for hst.  I left [name] [name] a vm to make her aware of message.

## 2019-06-06 ENCOUNTER — Other Ambulatory Visit: Payer: Self-pay

## 2019-06-06 ENCOUNTER — Ambulatory Visit (INDEPENDENT_AMBULATORY_CARE_PROVIDER_SITE_OTHER): Payer: Medicare Other

## 2019-06-06 DIAGNOSIS — R0609 Other forms of dyspnea: Secondary | ICD-10-CM

## 2019-06-06 DIAGNOSIS — R0600 Dyspnea, unspecified: Secondary | ICD-10-CM | POA: Diagnosis not present

## 2019-06-06 DIAGNOSIS — Z01810 Encounter for preprocedural cardiovascular examination: Secondary | ICD-10-CM

## 2019-06-06 MED ORDER — PERFLUTREN LIPID MICROSPHERE
1.0000 mL | INTRAVENOUS | Status: AC | PRN
  Administered 2019-06-06: 2 mL via INTRAVENOUS

## 2019-06-11 ENCOUNTER — Telehealth: Payer: Self-pay | Admitting: Internal Medicine

## 2019-06-12 ENCOUNTER — Ambulatory Visit (INDEPENDENT_AMBULATORY_CARE_PROVIDER_SITE_OTHER): Payer: Medicare Other | Admitting: Dermatology

## 2019-06-12 ENCOUNTER — Other Ambulatory Visit: Payer: Self-pay

## 2019-06-12 ENCOUNTER — Other Ambulatory Visit: Payer: Self-pay | Admitting: Family

## 2019-06-12 DIAGNOSIS — L219 Seborrheic dermatitis, unspecified: Secondary | ICD-10-CM

## 2019-06-12 DIAGNOSIS — L719 Rosacea, unspecified: Secondary | ICD-10-CM | POA: Diagnosis not present

## 2019-06-12 DIAGNOSIS — L821 Other seborrheic keratosis: Secondary | ICD-10-CM | POA: Diagnosis not present

## 2019-06-12 DIAGNOSIS — L732 Hidradenitis suppurativa: Secondary | ICD-10-CM | POA: Diagnosis not present

## 2019-06-12 DIAGNOSIS — I872 Venous insufficiency (chronic) (peripheral): Secondary | ICD-10-CM

## 2019-06-12 DIAGNOSIS — L709 Acne, unspecified: Secondary | ICD-10-CM | POA: Diagnosis not present

## 2019-06-12 MED ORDER — TORSEMIDE 20 MG PO TABS: ORAL_TABLET | ORAL | 3 refills | Status: DC

## 2019-06-12 MED ORDER — TACROLIMUS 0.1 % EX OINT: TOPICAL_OINTMENT | Freq: Two times a day (BID) | CUTANEOUS | 3 refills | Status: DC

## 2019-06-12 MED ORDER — POTASSIUM CITRATE ER 10 MEQ (1080 MG) PO TBCR: 10.0000 meq | EXTENDED_RELEASE_TABLET | Freq: Every morning | ORAL | 3 refills | Status: DC

## 2019-06-12 MED ORDER — CLOBETASOL PROPIONATE 0.05 % EX SOLN: CUTANEOUS | 2 refills | Status: DC

## 2019-06-12 MED ORDER — DOXYCYCLINE HYCLATE 20 MG PO TABS: ORAL_TABLET | ORAL | 3 refills | Status: DC

## 2019-06-12 NOTE — Progress Notes (Signed)
Follow-Up Visit   Subjective  [name] [name] is a 64 y.o. female who presents for the following: Rosacea (Doxycycline 20mg  BID. Still with redness, no bumps, no problems taking medication. ), Dermatitis (Scalp, using ketoconazole 2% shampoo, TMC 0.1% lotion.), Stasis Derm (Pt did not get rx compression stockings. Using tacrolimus. ), and spots (R axilla, present for a few months, sore. Using clindamycin solution. ).  The following portions of the chart were reviewed this encounter and updated as appropriate:     Review of Systems: No other skin or systemic complaints.  Objective  Well appearing patient in no apparent distress; mood and affect are within normal limits.  A focused examination was performed including axillae, legs, abdomen, face. Relevant physical exam findings are noted in the Assessment and Plan.  Objective  Right Axilla (5): Open comedones at axillae  Objective  trunk, axillae, groin: Scattered scars axillae, groin. Inflamed sinus tract at L abdomen.  Objective  face: Mid face erythema, well controlled.  Objective  Scalp: Pink patches with greasy scale.  Objective  Right Flank: Stuck-on, waxy, tan-brown papules and plaques -- Discussed benign etiology and prognosis.   Objective  lower legs: Erythematous, scaly patches involving the ankle and distal lower leg with associated lower leg edema.   Assessment & Plan  Acne, unspecified acne type (5) Right Axilla  Symptomatic, open comedones R axilla x 5  Acne/Milia surgery - Right Axilla Procedure risks and benefits were discussed with the patient and verbal consent was obtained. Following prep of the skin on the R axillae with an alcohol swab, extraction of comedones was performed with a comedone extractor. Vaseline ointment was applied to each site. The patient tolerated the procedure well.  Hidradenitis suppurativa trunk, axillae, groin  Chronic. Currently well controlled with a singled inflamed  sinus tract at left abdomen.  Continue doxycycline 20 mg bid Cont clindamycin gel to AA's QD Cont spironolactone as prescribed by PCP  Rosacea face  Well controlled Cont doxycycline 20mg  1 PO BID with food  Doxycycline counseling - take doxycycline with food and drink to prevent nausea but do not take with dairy. Do not lay down for 30 minutes after taking. Be cautious with sun exposure and use good sun protection while on this medication. Women should not become pregnant while taking this medication.    doxycycline (PERIOSTAT) 20 MG tablet - face  Seborrheic dermatitis Scalp  Improved but not at goal.   Cont ketoconazole 2% shampoo 3x/wk, leave on for 10 minutes before washing out. Start clobetasol 0.05% solution QD PRN  Topical steroids (such as triamcinolone, fluocinolone, fluocinonide, mometasone, clobetasol, halobetasol, betamethasone, hydrocortisone) can cause thinning and lightening of the skin if they are used for too long in the same area. Your physician has selected the right strength medicine for your problem and area affected on the body. Please use your medication only as directed by your physician to prevent side effects.    clobetasol (TEMOVATE) 0.05 % external solution - Scalp  Seborrheic keratosis Right Flank  Benign, observe.    Stasis dermatitis of both legs lower legs  Improving  Cont tacrolimus increasing to BID Recommend 20-54mmhg compression stockings daily. Pt has rx.   tacrolimus (PROTOPIC) 0.1 % ointment - lower legs  Return in about 4 months (around 10/12/2019) for HS, Stasis Derm, Rosacea.   Graciella Belton, RMA, am acting as scribe for Forest Gleason, MD .  Documentation: I have reviewed the above documentation for accuracy and completeness, and I agree with  the above.  Forest Gleason, MD

## 2019-06-12 NOTE — Telephone Encounter (Signed)
ATC patient went to VM.  Left message for pt to contact us back at [phone]. Rhonda J Cobb

## 2019-06-12 NOTE — Telephone Encounter (Signed)
Pt called back. Please call back. °

## 2019-06-12 NOTE — Patient Instructions (Addendum)
Doxycycline counseling - take doxycycline with food and drink to prevent nausea but do not take with dairy. Do not lay down for 30 minutes after taking. Be cautious with sun exposure and use good sun protection while on this medication. Women should not become pregnant while taking this medication.   Topical steroids (such as triamcinolone, fluocinolone, fluocinonide, mometasone, clobetasol, halobetasol, betamethasone, hydrocortisone) can cause thinning and lightening of the skin if they are used for too long in the same area. Your physician has selected the right strength medicine for your problem and area affected on the body. Please use your medication only as directed by your physician to prevent side effects.

## 2019-06-12 NOTE — Telephone Encounter (Signed)
ATC patient and did not get an answer and left her a voicemail with my direct line to call back to discuss her getting a sleep study.

## 2019-06-12 NOTE — Telephone Encounter (Signed)
I can not see why we called the pt besides maybe to schedule her sleep study Did one of the PCC's call her? Please advise, thanks!

## 2019-06-15 ENCOUNTER — Encounter: Payer: Self-pay | Admitting: Dermatology

## 2019-06-16 NOTE — Telephone Encounter (Signed)
Called and spoke with patient and she was inquiring about her sleep study appointment.  Sharyn Lull with the [facility] called patient today and advised that her study has been scheduled for 07/02/2019.  Nothing else needed at this time. Pt stated that this was all she was calling about. Rhonda J Cobb

## 2019-06-23 ENCOUNTER — Other Ambulatory Visit: Payer: Self-pay

## 2019-06-23 ENCOUNTER — Encounter: Payer: Self-pay | Admitting: Cardiology

## 2019-06-23 ENCOUNTER — Ambulatory Visit (INDEPENDENT_AMBULATORY_CARE_PROVIDER_SITE_OTHER): Payer: Medicare Other | Admitting: Cardiology

## 2019-06-23 VITALS — BP 110/68 | HR 80 | Ht 63.0 in | Wt 275.4 lb

## 2019-06-23 DIAGNOSIS — E7800 Pure hypercholesterolemia, unspecified: Secondary | ICD-10-CM | POA: Diagnosis not present

## 2019-06-23 DIAGNOSIS — R0609 Other forms of dyspnea: Secondary | ICD-10-CM

## 2019-06-23 DIAGNOSIS — R0600 Dyspnea, unspecified: Secondary | ICD-10-CM | POA: Diagnosis not present

## 2019-06-23 DIAGNOSIS — Z01810 Encounter for preprocedural cardiovascular examination: Secondary | ICD-10-CM | POA: Diagnosis not present

## 2019-06-23 NOTE — Patient Instructions (Signed)
Medication Instructions:  - Your physician recommends that you continue on your current medications as directed. Please refer to the Current Medication list given to you today.  *If you need a refill on your cardiac medications before your next appointment, please call your pharmacy*   Lab Work: - none ordered  If you have labs (blood work) drawn today and your tests are completely normal, you will receive your results only by: . MyChart Message (if you have MyChart) OR . A paper copy in the mail If you have any lab test that is abnormal or we need to change your treatment, we will call you to review the results.   Testing/Procedures: - none ordered   Follow-Up: At [facility], you and your health needs are our priority.  As part of our continuing mission to provide you with exceptional heart care, we have created designated Provider Care Teams.  These Care Teams include your primary Cardiologist (physician) and Advanced Practice Providers (APPs -  Physician Assistants and Nurse Practitioners) who all work together to provide you with the care you need, when you need it.  We recommend signing up for the patient portal called "MyChart".  Sign up information is provided on this After Visit Summary.  MyChart is used to connect with patients for Virtual Visits (Telemedicine).  Patients are able to view lab/test results, encounter notes, upcoming appointments, etc.  Non-urgent messages can be sent to your provider as well.   To learn more about what you can do with MyChart, go to [email or web address].    Your next appointment:   1 year(s)  The format for your next appointment:   In Person  Provider:   Brian Agbor-Etang, MD   Other Instructions n/a  

## 2019-06-23 NOTE — Progress Notes (Signed)
Cardiology Office Note:    Date:  06/23/2019   ID:  [name] [name], DOB 18-Oct-1955, MRN [id number]  PCP:  Denton Lank, MD  Cardiologist:  Kate Sable, MD  Electrophysiologist:  None   Referring MD: Denton Lank, MD   Chief Complaint  Patient presents with  . office visit    F/U after stress test and echo; Meds verbally reviewed with patient.    History of Present Illness:    [name] [name] is a 64 y.o. female with a hx of COPD, diabetes, CHF, morbid obesity presents for follow-up.  She was last seen due to history of CHF and presurgical eval.  Patient is morbidly obese and is considering gastric bypass.  She denies chest pain with exertion but states being short of breath which she attributes to COPD.  An echocardiogram and myocardial perfusion imaging stress test was ordered.  Patient now presents for results.  Past Medical History:  Diagnosis Date  . Allergic rhinitis   . Allergy   . Asthma   . Back pain   . CHF (congestive heart failure) ([place])   . COPD (chronic obstructive pulmonary disease) ([place])   . Degenerative joint disease of knee, left   . Depression   . Diabetes mellitus without complication ([place])   . Edema   . Elevated lipids   . GERD (gastroesophageal reflux disease)   . Hidradenitis   . History of colonic polyps   . Hypertension   . Hypothyroidism   . IBS (irritable bowel syndrome)   . Insomnia   . Obesity   . Oxygen dependent   . Pneumonia   . PVD (peripheral vascular disease) ([place])   . Sinusitis, chronic   . Sleep apnea   . Thyroid disease   . Vaginitis, atrophic   . Vertigo   . Vitamin D deficiency     Past Surgical History:  Procedure Laterality Date  . ABDOMINAL HYSTERECTOMY    . AXILLARY HIDRADENITIS EXCISION    . BREAST EXCISIONAL BIOPSY Bilateral 74/8270   RUPTURED FOLLICULAR CYSTS WITH ABSCESSES AND SCARRING, CONSISTENT WITH HIDRADENITIS SUPPURATIVA.   [name] CESAREAN SECTION     x 2  . CHOLECYSTECTOMY    . HEEL SPUR EXCISION N/A     . HYDRADENITIS EXCISION Right 12/31/2015   Procedure: EXCISION HIDRADENITIS AXILLA;  Surgeon: Clayburn Pert, MD;  Location: [facility] ORS;  Service: General;  Laterality: Right;  . KNEE SURGERY Left    1998  . TONSILLECTOMY      Current Medications: Current Meds  Medication Sig  . albuterol (PROVENTIL HFA;VENTOLIN HFA) 108 (90 Base) MCG/ACT inhaler Inhale 2 puffs into the lungs every 6 (six) hours as needed for wheezing or shortness of breath.  [name] albuterol (PROVENTIL) (2.5 MG/3ML) 0.083% nebulizer solution Take 2.5 mg by nebulization every 6 (six) hours as needed for wheezing or shortness of breath.   [name] ammonium lactate (AMLACTIN) 12 % cream Apply topically as needed for dry skin.  [name] aspirin EC 81 MG tablet Take 81 mg by mouth daily.  [name] atorvastatin (LIPITOR) 20 MG tablet Take 20 mg by mouth at bedtime.   . budesonide-formoterol (SYMBICORT) 160-4.5 MCG/ACT inhaler Inhale 2 puffs into the lungs 2 (two) times daily.  . clindamycin (CLINDAGEL) 1 % gel Apply 1 application topically 2 (two) times daily.  . clobetasol (TEMOVATE) 0.05 % external solution Apply to affected areas scalp once daily as needed for itch. Avoid face, groin, axilla.  [name] DALIRESP 500 MCG TABS tablet Take 500 mcg  by mouth daily.   [name] doxepin (SINEQUAN) 50 MG capsule Take 50 mg by mouth at bedtime.  [name] doxycycline (PERIOSTAT) 20 MG tablet Take 1 pill twice a day with food.  . Dulaglutide (TRULICITY) 9.29 VF/4.7BU SOPN Inject into the skin once a week.  . DULoxetine (CYMBALTA) 20 MG capsule Take 20 mg by mouth daily.  . fluocinonide (LIDEX) 0.05 % external solution Apply 1 application topically 2 (two) times daily.  . fluticasone (FLONASE) 50 MCG/ACT nasal spray   . gabapentin (NEURONTIN) 300 MG capsule Take 300 mg by mouth 2 (two) times daily.   [name] glucose blood test strip TEST three times a day  . HUMULIN R U-500 KWIKPEN 500 UNIT/ML injection Inject 120 Units into the skin 2 (two) times daily with a meal.   . ibuprofen  (ADVIL,MOTRIN) 800 MG tablet Take 1 tablet (800 mg total) by mouth every 8 (eight) hours as needed for mild pain or moderate pain (with food).  [name] ketoconazole (NIZORAL) 2 % shampoo Apply 1 application topically 2 (two) times a week.  . Lancets Misc. (ACCU-CHEK FASTCLIX LANCET) KIT   . levothyroxine (SYNTHROID, LEVOTHROID) 200 MCG tablet Take 200 mcg by mouth daily before breakfast.   . LUTEIN PO Take 1 tablet by mouth daily.  . meclizine (ANTIVERT) 25 MG tablet Take 25 mg by mouth 3 (three) times daily as needed for dizziness.  . Melatonin 5 MG CAPS Take 2 capsules by mouth daily.  . mometasone (NASONEX) 50 MCG/ACT nasal spray Place 2 sprays into both nostrils daily.   . montelukast (SINGULAIR) 10 MG tablet Take 10 mg by mouth at bedtime.   [name] nystatin (MYCOSTATIN) 100000 UNIT/ML suspension   . olopatadine (PATANOL) 0.1 % ophthalmic solution Place 1 drop into both eyes 2 (two) times daily.  [name] omeprazole (PRILOSEC) 40 MG capsule Take 40 mg by mouth daily.  . potassium citrate (UROCIT-K) 10 MEQ (1080 MG) SR tablet Take 1 tablet (10 mEq total) by mouth every morning.  [name] spironolactone (ALDACTONE) 50 MG tablet Take 100 mg by mouth daily.   . tacrolimus (PROTOPIC) 0.1 % ointment Apply topically 2 (two) times daily.  . tacrolimus (PROTOPIC) 0.1 % ointment Apply topically 2 (two) times daily. To legs  . torsemide (DEMADEX) 20 MG tablet TAKE  1 TABLETS BY MOUTH daily  . traZODone (DESYREL) 50 MG tablet Take 50-100 mg by mouth at bedtime as needed for sleep.   [name] triamcinolone cream (KENALOG) 0.1 % Apply 1 application topically 2 (two) times daily.  . TRULICITY 1.5 YZ/7.0DU SOPN Inject 1.5 mg into the skin once a week.     Allergies:   Codeine   Social History   Socioeconomic History  . Marital status: Divorced    Spouse name: Not on file  . Number of children: Not on file  . Years of education: Not on file  . Highest education level: Not on file  Occupational History  . Occupation: disabled    Tobacco Use  . Smoking status: Former Smoker    Quit date: 12/18/1993    Years since quitting: 25.5  . Smokeless tobacco: Never Used  Substance and Sexual Activity  . Alcohol use: No  . Drug use: No  . Sexual activity: Never    Birth control/protection: Abstinence  Other Topics Concern  . Not on file  Social History Narrative  . Not on file   Social Determinants of Health   Financial Resource Strain:   . Difficulty of Paying Living Expenses:  Food Insecurity:   . Worried About Charity fundraiser in the Last Year:   . Arboriculturist in the Last Year:   Transportation Needs:   . Film/video editor (Medical):   [name] Lack of Transportation (Non-Medical):   Physical Activity:   . Days of Exercise per Week:   . Minutes of Exercise per Session:   Stress:   . Feeling of Stress :   Social Connections:   . Frequency of Communication with Friends and Family:   . Frequency of Social Gatherings with Friends and Family:   . Attends Religious Services:   . Active Member of Clubs or Organizations:   . Attends Archivist Meetings:   [name] Marital Status:      Family History: The patient's family history includes Breast cancer in her paternal grandmother; COPD in her mother; Heart disease in her father; Hypertension in her father; Rashes / Skin problems in her father. There is no history of Kidney cancer or Bladder Cancer.  ROS:   Please see the history of present illness.     All other systems reviewed and are negative.  EKGs/Labs/Other Studies Reviewed:    The following studies were reviewed today:   EKG:  EKG is  ordered today.  The ekg ordered today demonstrates sinus rhythm, right bundle branch block, left anterior fascicular block.  Recent Labs: No results found for requested labs within last 8760 hours.  Recent Lipid Panel    Component Value Date/Time   CHOL 107 09/18/2012 0622   TRIG 282 (H) 09/18/2012 0622   HDL 29 (L) 09/18/2012 0622   VLDL 56 (H)  09/18/2012 0622   LDLCALC 22 09/18/2012 0622    Physical Exam:    VS:  BP 110/68 (BP Location: Left Arm, Patient Position: Sitting, Cuff Size: Large)   Pulse 80   Ht 5' 3"  (1.6 m)   Wt 275 lb 6 oz (124.9 kg)   SpO2 95%   BMI 48.78 kg/m     Wt Readings from Last 3 Encounters:  06/23/19 275 lb 6 oz (124.9 kg)  05/16/19 271 lb (122.9 kg)  05/13/19 271 lb (122.9 kg)     GEN:  Well nourished, well developed in no acute distress, obese HEENT: Normal NECK: No JVD; No carotid bruits LYMPHATICS: No lymphadenopathy CARDIAC: RRR, no murmurs, rubs, gallops RESPIRATORY:  Clear to auscultation without rales, wheezing or rhonchi  ABDOMEN: Soft, non-tender, distended MUSCULOSKELETAL:  1+ edema; No deformity  SKIN: Warm and dry NEUROLOGIC:  Alert and oriented x 3 PSYCHIATRIC:  Normal affect   ASSESSMENT:    1. Dyspnea on exertion   2. Morbid obesity ([place])   3. Pure hypercholesterolemia   4. Preoperative cardiovascular examination    PLAN:     1. Patient with history of dyspnea on exertion.  Echocardiogram shows normal systolic function with EF 60 to 27%, grade 2 diastolic dysfunction.  Her dyspnea on exertion could be multifactorial including OSA, COPD, morbid obesity, diastolic dysfunction.  Myocardial perfusion imaging stress test did not show any evidence for ischemia, LVEF was normal.  Low risk scan.  Diastolic dysfunction could be secondary to obesity. patient can therefore undergo surgery from a cardiac perspective without any additional cardiac intervention.  No intervention or medication will change patient's risk from present. 2. Patient is morbidly obese.  Continue low calorie diet.  Weight loss/bypass surgery planning underway. 3. History of hyperlipidemia, continue statin as prescribed.  Follow-up in 6 - 12 months.  This note was generated in part or whole with voice recognition software. Voice recognition is usually quite accurate but there are transcription errors that  can and very often do occur. I apologize for any typographical errors that were not detected and corrected.  Medication Adjustments/Labs and Tests Ordered: Current medicines are reviewed at length with the patient today.  Concerns regarding medicines are outlined above.  Orders Placed This Encounter  Procedures  . EKG 12-Lead   No orders of the defined types were placed in this encounter.   Patient Instructions  Medication Instructions:  - Your physician recommends that you continue on your current medications as directed. Please refer to the Current Medication list given to you today.  *If you need a refill on your cardiac medications before your next appointment, please call your pharmacy*   Lab Work: - none ordered  If you have labs (blood work) drawn today and your tests are completely normal, you will receive your results only by: [name] MyChart Message (if you have MyChart) OR . A paper copy in the mail If you have any lab test that is abnormal or we need to change your treatment, we will call you to review the results.   Testing/Procedures: - none ordered   Follow-Up: At [facility], you and your health needs are our priority.  As part of our continuing mission to provide you with exceptional heart care, we have created designated Provider Care Teams.  These Care Teams include your primary Cardiologist (physician) and Advanced Practice Providers (APPs -  Physician Assistants and Nurse Practitioners) who all work together to provide you with the care you need, when you need it.  We recommend signing up for the patient portal called "MyChart".  Sign up information is provided on this After Visit Summary.  MyChart is used to connect with patients for Virtual Visits (Telemedicine).  Patients are able to view lab/test results, encounter notes, upcoming appointments, etc.  Non-urgent messages can be sent to your provider as well.   To learn more about what you can do with MyChart,  go to [email or web address].    Your next appointment:   1 year(s)  The format for your next appointment:   In Person  Provider:   Kate Sable, MD   Other Instructions n/a     Signed, Kate Sable, MD  06/23/2019 5:34 PM    [place]

## 2019-06-27 ENCOUNTER — Other Ambulatory Visit: Payer: Self-pay | Admitting: Family

## 2019-06-27 MED ORDER — POTASSIUM CITRATE ER 10 MEQ (1080 MG) PO TBCR: 10.0000 meq | EXTENDED_RELEASE_TABLET | Freq: Every morning | ORAL | 3 refills | Status: DC

## 2019-06-30 ENCOUNTER — Other Ambulatory Visit: Payer: Self-pay

## 2019-06-30 ENCOUNTER — Other Ambulatory Visit
Admission: RE | Admit: 2019-06-30 | Discharge: 2019-06-30 | Disposition: A | Discharge status: Home / Self Care | Payer: Medicare Other | Source: Ambulatory Visit | Attending: Internal Medicine | Admitting: Internal Medicine

## 2019-06-30 DIAGNOSIS — Z20822 Contact with and (suspected) exposure to covid-19: Secondary | ICD-10-CM | POA: Insufficient documentation

## 2019-06-30 DIAGNOSIS — Z01812 Encounter for preprocedural laboratory examination: Secondary | ICD-10-CM | POA: Diagnosis present

## 2019-06-30 LAB — SARS CORONAVIRUS 2 (TAT 6-24 HRS): SARS Coronavirus 2: NEGATIVE

## 2019-07-02 ENCOUNTER — Ambulatory Visit: Payer: Medicare Other | Attending: Pulmonary Disease

## 2019-07-02 DIAGNOSIS — G4733 Obstructive sleep apnea (adult) (pediatric): Secondary | ICD-10-CM | POA: Diagnosis present

## 2019-07-02 DIAGNOSIS — J449 Chronic obstructive pulmonary disease, unspecified: Secondary | ICD-10-CM | POA: Insufficient documentation

## 2019-07-02 DIAGNOSIS — R0902 Hypoxemia: Secondary | ICD-10-CM | POA: Diagnosis not present

## 2019-07-03 ENCOUNTER — Other Ambulatory Visit: Payer: Self-pay

## 2019-07-07 ENCOUNTER — Other Ambulatory Visit: Payer: Self-pay | Admitting: Family Medicine

## 2019-07-07 DIAGNOSIS — Z1231 Encounter for screening mammogram for malignant neoplasm of breast: Secondary | ICD-10-CM

## 2019-07-08 DIAGNOSIS — G4733 Obstructive sleep apnea (adult) (pediatric): Secondary | ICD-10-CM

## 2019-07-15 ENCOUNTER — Inpatient Hospital Stay: Payer: Medicare Other

## 2019-07-15 ENCOUNTER — Other Ambulatory Visit: Payer: Self-pay

## 2019-07-15 ENCOUNTER — Inpatient Hospital Stay: Payer: Medicare Other | Attending: Oncology | Admitting: Oncology

## 2019-07-15 ENCOUNTER — Encounter: Payer: Self-pay | Admitting: Oncology

## 2019-07-15 DIAGNOSIS — J441 Chronic obstructive pulmonary disease with (acute) exacerbation: Secondary | ICD-10-CM | POA: Insufficient documentation

## 2019-07-15 DIAGNOSIS — R7989 Other specified abnormal findings of blood chemistry: Secondary | ICD-10-CM | POA: Diagnosis not present

## 2019-07-15 DIAGNOSIS — I5033 Acute on chronic diastolic (congestive) heart failure: Secondary | ICD-10-CM | POA: Insufficient documentation

## 2019-07-15 DIAGNOSIS — Z79899 Other long term (current) drug therapy: Secondary | ICD-10-CM | POA: Diagnosis not present

## 2019-07-15 DIAGNOSIS — D729 Disorder of white blood cells, unspecified: Secondary | ICD-10-CM | POA: Diagnosis not present

## 2019-07-15 DIAGNOSIS — E119 Type 2 diabetes mellitus without complications: Secondary | ICD-10-CM | POA: Diagnosis not present

## 2019-07-15 DIAGNOSIS — I110 Hypertensive heart disease with heart failure: Secondary | ICD-10-CM | POA: Insufficient documentation

## 2019-07-15 DIAGNOSIS — D72829 Elevated white blood cell count, unspecified: Secondary | ICD-10-CM | POA: Diagnosis present

## 2019-07-15 DIAGNOSIS — Z791 Long term (current) use of non-steroidal anti-inflammatories (NSAID): Secondary | ICD-10-CM | POA: Diagnosis not present

## 2019-07-15 DIAGNOSIS — Z7982 Long term (current) use of aspirin: Secondary | ICD-10-CM | POA: Diagnosis not present

## 2019-07-15 DIAGNOSIS — J449 Chronic obstructive pulmonary disease, unspecified: Secondary | ICD-10-CM | POA: Diagnosis not present

## 2019-07-15 DIAGNOSIS — Z7951 Long term (current) use of inhaled steroids: Secondary | ICD-10-CM | POA: Insufficient documentation

## 2019-07-15 DIAGNOSIS — Z794 Long term (current) use of insulin: Secondary | ICD-10-CM | POA: Insufficient documentation

## 2019-07-15 DIAGNOSIS — I272 Pulmonary hypertension, unspecified: Secondary | ICD-10-CM | POA: Diagnosis not present

## 2019-07-15 DIAGNOSIS — L719 Rosacea, unspecified: Secondary | ICD-10-CM | POA: Insufficient documentation

## 2019-07-15 DIAGNOSIS — I5032 Chronic diastolic (congestive) heart failure: Secondary | ICD-10-CM | POA: Diagnosis not present

## 2019-07-15 DIAGNOSIS — Z87891 Personal history of nicotine dependence: Secondary | ICD-10-CM | POA: Diagnosis not present

## 2019-07-15 LAB — CBC WITH DIFFERENTIAL/PLATELET
Abs Immature Granulocytes: 0.09 10*3/uL — ABNORMAL HIGH (ref 0.00–0.07)
Basophils Absolute: 0.1 10*3/uL (ref 0.0–0.1)
Basophils Relative: 1 %
Eosinophils Absolute: 0.1 10*3/uL (ref 0.0–0.5)
Eosinophils Relative: 1 %
HCT: 43 % (ref 36.0–46.0)
Hemoglobin: 13.6 g/dL (ref 12.0–15.0)
Immature Granulocytes: 1 %
Lymphocytes Relative: 12 %
Lymphs Abs: 1.7 10*3/uL (ref 0.7–4.0)
MCH: 25.5 pg — ABNORMAL LOW (ref 26.0–34.0)
MCHC: 31.6 g/dL (ref 30.0–36.0)
MCV: 80.5 fL (ref 80.0–100.0)
Monocytes Absolute: 0.6 10*3/uL (ref 0.1–1.0)
Monocytes Relative: 4 %
Neutro Abs: 12 10*3/uL — ABNORMAL HIGH (ref 1.7–7.7)
Neutrophils Relative %: 81 %
Platelets: 307 10*3/uL (ref 150–400)
RBC: 5.34 MIL/uL — ABNORMAL HIGH (ref 3.87–5.11)
RDW: 14.6 % (ref 11.5–15.5)
WBC: 14.6 10*3/uL — ABNORMAL HIGH (ref 4.0–10.5)
nRBC: 0 % (ref 0.0–0.2)

## 2019-07-15 LAB — TECHNOLOGIST SMEAR REVIEW: Plt Morphology: ADEQUATE

## 2019-07-15 LAB — IRON AND TIBC
Iron: 44 ug/dL (ref 28–170)
Saturation Ratios: 11 % (ref 10.4–31.8)
TIBC: 389 ug/dL (ref 250–450)
UIBC: 345 ug/dL

## 2019-07-15 LAB — VITAMIN B12: Vitamin B-12: 253 pg/mL (ref 180–914)

## 2019-07-15 LAB — FERRITIN: Ferritin: 22 ng/mL (ref 11–307)

## 2019-07-15 LAB — FOLATE: Folate: 9.4 ng/mL (ref >5.9)

## 2019-07-15 NOTE — Progress Notes (Signed)
Patient here for initial oncology appointment, expresses no complaints or concerns at this time.   

## 2019-07-19 ENCOUNTER — Encounter: Payer: Self-pay | Admitting: Oncology

## 2019-07-19 NOTE — Progress Notes (Signed)
Hematology/Oncology Consult note [facility] ([facility]) [phone] [phone]  Patient Care Team: Denton Lank, MD as PCP - General (Family Medicine) Kate Sable, MD as PCP - Cardiology (Cardiology) Alisa Graff, FNP as Nurse Practitioner (Family Medicine) Erby Pian, MD as Consulting Physician (Pulmonary Disease) Teodoro Spray, MD as Consulting Physician (Cardiology)   Name of the patient: [name] [name]  [id number]  November 01, 1955    Reason for referral- leucocytosis   Referring physician- Dr. Posey Pronto  Date of visit: 07/19/19   History of presenting illness-Patient is a 64 year old female with a past medical history significant for COPD, heart failure, diabetes and hypertension among other medical problems.  She has been referred to [place] for leukocytosis.  Most recent CBC from April 2021 showed white cell count of 13.9, 13.7/43.1 with an MCV of 80 and platelet count of 91.  Differential showed mainly neutropenia with an absolute neutrophil count of 11.  CMP was within normal limits.  TSH was normal.  Looking back at her prior CBCs patient has had chronic neutrophilia/leukocytosis at least dating back to 2014 and her white cell count fluctuates anywhere between 11-20.  Hemoglobin and platelet counts have been relatively normal.  Currently patient reports ongoing fatigue.  She will be undergoing weight loss surgery soon and has therefore been referred to [place] to rule out any hematological causes of leukocytosis.  ECOG PS- 1 Pain scale- 0   Review of systems- Review of Systems  Constitutional: Positive for malaise/fatigue. Negative for chills, fever and weight loss.  HENT: Negative for congestion, ear discharge and nosebleeds.   Eyes: Negative for blurred vision.  Respiratory: Negative for cough, hemoptysis, sputum production, shortness of breath and wheezing.   Cardiovascular: Negative for chest pain, palpitations, orthopnea and claudication.   Gastrointestinal: Negative for abdominal pain, blood in stool, constipation, diarrhea, heartburn, melena, nausea and vomiting.  Genitourinary: Negative for dysuria, flank pain, frequency, hematuria and urgency.  Musculoskeletal: Negative for back pain, joint pain and myalgias.  Skin: Negative for rash.  Neurological: Negative for dizziness, tingling, focal weakness, seizures, weakness and headaches.  Endo/Heme/Allergies: Does not bruise/bleed easily.  Psychiatric/Behavioral: Negative for depression and suicidal ideas. The patient does not have insomnia.     Allergies  Allergen Reactions  . Codeine Itching    Patient Active Problem List   Diagnosis Date Noted  . Rosacea 05/30/2019  . COPD (chronic obstructive pulmonary disease) ([place]) 08/25/2016  . Acute on chronic heart failure with normal ejection fraction ([place]) 08/20/2016  . Chronic diastolic heart failure ([place]) 06/02/2016  . Bilateral lower leg cellulitis 05/23/2016  . Leukocytosis 05/23/2016  . Iron deficiency anemia 05/23/2016  . Thrombocytosis ([place]) 05/23/2016  . COPD with acute exacerbation ([place]) 05/21/2016  . Pulmonary hypertension ([place]) 12/14/2015  . Pneumonia 09/08/2015  . Breast abscess 06/02/2015  . Abnormal mammogram of both breasts 02/25/2015  . Hidradenitis 12/30/2014  . Diabetes ([place]) 12/30/2014  . Essential hypertension 12/30/2014  . Asthma 12/30/2014     Past Medical History:  Diagnosis Date  . Allergic rhinitis   . Allergy   . Asthma   . Back pain   . CHF (congestive heart failure) ([place])   . COPD (chronic obstructive pulmonary disease) ([place])   . Degenerative joint disease of knee, left   . Depression   . Diabetes mellitus without complication ([place])   . Edema   . Elevated lipids   . GERD (gastroesophageal reflux disease)   . Hidradenitis   . History of colonic polyps   .  Hypertension   . Hypothyroidism   . IBS (irritable bowel syndrome)   . Insomnia   . Obesity   . Oxygen dependent   .  Pneumonia   . PVD (peripheral vascular disease) ([place])   . Sinusitis, chronic   . Sleep apnea   . Thyroid disease   . Vaginitis, atrophic   . Vertigo   . Vitamin D deficiency      Past Surgical History:  Procedure Laterality Date  . ABDOMINAL HYSTERECTOMY    . AXILLARY HIDRADENITIS EXCISION    . BREAST EXCISIONAL BIOPSY Bilateral 43/1540   RUPTURED FOLLICULAR CYSTS WITH ABSCESSES AND SCARRING, CONSISTENT WITH HIDRADENITIS SUPPURATIVA.   [name] CESAREAN SECTION     x 2  . CHOLECYSTECTOMY    . HEEL SPUR EXCISION N/A   . HYDRADENITIS EXCISION Right 12/31/2015   Procedure: EXCISION HIDRADENITIS AXILLA;  Surgeon: Clayburn Pert, MD;  Location: [facility] ORS;  Service: General;  Laterality: Right;  . KNEE SURGERY Left    1998  . TONSILLECTOMY      Social History   Socioeconomic History  . Marital status: Divorced    Spouse name: Not on file  . Number of children: Not on file  . Years of education: Not on file  . Highest education level: Not on file  Occupational History  . Occupation: disabled  Tobacco Use  . Smoking status: Former Smoker    Quit date: 12/18/1993    Years since quitting: 25.6  . Smokeless tobacco: Never Used  Substance and Sexual Activity  . Alcohol use: No  . Drug use: No  . Sexual activity: Never    Birth control/protection: Abstinence  Other Topics Concern  . Not on file  Social History Narrative  . Not on file   Social Determinants of Health   Financial Resource Strain:   . Difficulty of Paying Living Expenses:   Food Insecurity:   . Worried About Charity fundraiser in the Last Year:   . Arboriculturist in the Last Year:   Transportation Needs:   . Film/video editor (Medical):   [name] Lack of Transportation (Non-Medical):   Physical Activity:   . Days of Exercise per Week:   . Minutes of Exercise per Session:   Stress:   . Feeling of Stress :   Social Connections:   . Frequency of Communication with Friends and Family:   . Frequency of  Social Gatherings with Friends and Family:   . Attends Religious Services:   . Active Member of Clubs or Organizations:   . Attends Archivist Meetings:   [name] Marital Status:   Intimate Partner Violence:   . Fear of Current or Ex-Partner:   . Emotionally Abused:   [name] Physically Abused:   . Sexually Abused:      Family History  Problem Relation Age of Onset  . Rashes / Skin problems Father   . Hypertension Father   . Heart disease Father   . Breast cancer Paternal Grandmother   . COPD Mother   . Kidney cancer Neg Hx   . Bladder Cancer Neg Hx      Current Outpatient Medications:  .  albuterol (PROVENTIL HFA;VENTOLIN HFA) 108 (90 Base) MCG/ACT inhaler, Inhale 2 puffs into the lungs every 6 (six) hours as needed for wheezing or shortness of breath., Disp: , Rfl:  .  albuterol (PROVENTIL) (2.5 MG/3ML) 0.083% nebulizer solution, Take 2.5 mg by nebulization every 6 (six) hours as needed for wheezing  or shortness of breath. , Disp: , Rfl:  .  ammonium lactate (AMLACTIN) 12 % cream, Apply topically as needed for dry skin., Disp: , Rfl:  .  aspirin EC 81 MG tablet, Take 81 mg by mouth daily., Disp: , Rfl:  .  atorvastatin (LIPITOR) 20 MG tablet, Take 20 mg by mouth at bedtime. , Disp: , Rfl:  .  budesonide-formoterol (SYMBICORT) 160-4.5 MCG/ACT inhaler, Inhale 2 puffs into the lungs 2 (two) times daily., Disp: , Rfl:  .  clindamycin (CLINDAGEL) 1 % gel, Apply 1 application topically 2 (two) times daily., Disp: 30 g, Rfl: 0 .  clobetasol (TEMOVATE) 0.05 % external solution, , Disp: , Rfl:  .  clobetasol (TEMOVATE) 0.05 % external solution, Apply to affected areas scalp once daily as needed for itch. Avoid face, groin, axilla., Disp: 50 mL, Rfl: 2 .  DALIRESP 500 MCG TABS tablet, Take 500 mcg by mouth daily. , Disp: , Rfl: 0 .  doxepin (SINEQUAN) 50 MG capsule, Take 50 mg by mouth at bedtime., Disp: , Rfl:  .  doxycycline (MONODOX) 100 MG capsule, , Disp: , Rfl:  .  doxycycline  (PERIOSTAT) 20 MG tablet, Take 1 pill twice a day with food., Disp: 60 tablet, Rfl: 3 .  Dulaglutide (TRULICITY) 1.70 YF/7.4BS SOPN, Inject into the skin once a week., Disp: , Rfl:  .  DULoxetine (CYMBALTA) 20 MG capsule, Take 20 mg by mouth daily., Disp: , Rfl:  .  fluocinonide (LIDEX) 0.05 % external solution, Apply 1 application topically 2 (two) times daily., Disp: , Rfl:  .  fluticasone (FLONASE) 50 MCG/ACT nasal spray, , Disp: , Rfl:  .  gabapentin (NEURONTIN) 300 MG capsule, Take 300 mg by mouth 2 (two) times daily. , Disp: , Rfl: 0 .  glucose blood test strip, TEST three times a day, Disp: , Rfl:  .  HUMULIN R U-500 KWIKPEN 500 UNIT/ML injection, Inject 120 Units into the skin 2 (two) times daily with a meal. , Disp: , Rfl: 1 .  ibuprofen (ADVIL,MOTRIN) 800 MG tablet, Take 1 tablet (800 mg total) by mouth every 8 (eight) hours as needed for mild pain or moderate pain (with food)., Disp: 20 tablet, Rfl: 0 .  ketoconazole (NIZORAL) 2 % shampoo, Apply 1 application topically 2 (two) times a week., Disp: , Rfl:  .  Lancets Misc. (ACCU-CHEK FASTCLIX LANCET) KIT, , Disp: , Rfl:  .  levothyroxine (SYNTHROID, LEVOTHROID) 200 MCG tablet, Take 200 mcg by mouth daily before breakfast. , Disp: , Rfl: 1 .  meclizine (ANTIVERT) 25 MG tablet, Take 25 mg by mouth 3 (three) times daily as needed for dizziness., Disp: , Rfl:  .  Melatonin 5 MG CAPS, Take 2 capsules by mouth daily., Disp: , Rfl:  .  mometasone (NASONEX) 50 MCG/ACT nasal spray, Place 2 sprays into both nostrils daily. , Disp: , Rfl:  .  montelukast (SINGULAIR) 10 MG tablet, Take 10 mg by mouth at bedtime. , Disp: , Rfl: 1 .  olopatadine (PATANOL) 0.1 % ophthalmic solution, Place 1 drop into both eyes 2 (two) times daily., Disp: , Rfl:  .  omeprazole (PRILOSEC) 40 MG capsule, Take 40 mg by mouth daily., Disp: , Rfl:  .  potassium citrate (UROCIT-K) 10 MEQ (1080 MG) SR tablet, Take 1 tablet (10 mEq total) by mouth every morning., Disp: 90  tablet, Rfl: 3 .  spironolactone (ALDACTONE) 50 MG tablet, Take 100 mg by mouth daily. , Disp: , Rfl:  .  tacrolimus (  PROTOPIC) 0.1 % ointment, Apply topically 2 (two) times daily., Disp: , Rfl:  .  tacrolimus (PROTOPIC) 0.1 % ointment, Apply topically 2 (two) times daily. To legs, Disp: 100 g, Rfl: 3 .  torsemide (DEMADEX) 20 MG tablet, TAKE  1 TABLETS BY MOUTH daily, Disp: 90 tablet, Rfl: 3 .  traZODone (DESYREL) 50 MG tablet, Take 50-100 mg by mouth at bedtime as needed for sleep. , Disp: , Rfl:  .  triamcinolone cream (KENALOG) 0.1 %, Apply 1 application topically 2 (two) times daily., Disp: , Rfl:  .  TRULICITY 1.5 TG/6.2IR SOPN, Inject 1.5 mg into the skin once a week., Disp: , Rfl:  .  LUTEIN PO, Take 1 tablet by mouth daily., Disp: , Rfl:  .  nystatin (MYCOSTATIN) 100000 UNIT/ML suspension, , Disp: , Rfl:    Physical exam: There were no vitals filed for this visit. Physical Exam Constitutional:      General: She is not in acute distress. HENT:     Head: Normocephalic and atraumatic.  Eyes:     Pupils: Pupils are equal, round, and reactive to light.  Cardiovascular:     Rate and Rhythm: Normal rate and regular rhythm.     Heart sounds: Normal heart sounds.  Pulmonary:     Effort: Pulmonary effort is normal.     Breath sounds: Normal breath sounds.  Abdominal:     General: Bowel sounds are normal.     Palpations: Abdomen is soft.  Musculoskeletal:     Cervical back: Normal range of motion.  Skin:    General: Skin is warm and dry.  Neurological:     Mental Status: She is alert and oriented to person, place, and time.        CMP Latest Ref Rng & Units 06/25/2017  Glucose 65 - 99 mg/dL 113(H)  BUN 6 - 20 mg/dL 16  Creatinine 0.44 - 1.00 mg/dL 0.85  Sodium 135 - 145 mmol/L 136  Potassium 3.5 - 5.1 mmol/L 3.5  Chloride 101 - 111 mmol/L 100(L)  CO2 22 - 32 mmol/L 27  Calcium 8.9 - 10.3 mg/dL 9.1  Total Protein 6.5 - 8.1 g/dL 7.4  Total Bilirubin 0.3 - 1.2 mg/dL 0.6    Alkaline Phos 38 - 126 U/L 122  AST 15 - 41 U/L 19  ALT 14 - 54 U/L 11(L)   CBC Latest Ref Rng & Units 07/15/2019  WBC 4.0 - 10.5 K/uL 14.6(H)  Hemoglobin 12.0 - 15.0 g/dL 13.6  Hematocrit 36.0 - 46.0 % 43.0  Platelets 150 - 400 K/uL 307    No images are attached to the encounter.  SLEEP STUDY DOCUMENTS  Result Date: 07/09/2019 Ordered by an unspecified provider.  SLEEP STUDY DOCUMENTS  Result Date: 07/04/2019 Ordered by an unspecified provider.   Assessment and plan- Patient is a 64 y.o. female referred for leucocytosis/ neutrophilia  Suspect leukocytosis/neutrophilia is chronic and reactive secondary to underlying medical problems.  Today I will check a CBC B12, flow cytometry and smear review.  Patient noted to have intermittent anemia in the past I will check ferritin iron studies B12 and folate today.  telephone visit with me in 2 weeks time to discuss the results of the blood work.  See me in person in 3 months with cbc with diff   Thank you for this kind referral and the opportunity to participate in the care of this  Patient   Visit Diagnosis 1. Neutrophilia     Dr. Randa Evens, MD,  [facility] [phone] 07/19/2019 3:15 PM

## 2019-07-21 LAB — COMP PANEL: LEUKEMIA/LYMPHOMA

## 2019-07-25 ENCOUNTER — Encounter: Payer: Self-pay | Admitting: Oncology

## 2019-07-25 ENCOUNTER — Other Ambulatory Visit: Payer: Self-pay

## 2019-07-25 ENCOUNTER — Inpatient Hospital Stay: Payer: Medicare Other | Attending: Oncology | Admitting: Oncology

## 2019-07-25 DIAGNOSIS — D729 Disorder of white blood cells, unspecified: Secondary | ICD-10-CM | POA: Diagnosis not present

## 2019-07-25 NOTE — Progress Notes (Signed)
Patient called/ pre- screened for virtual appoinment today with oncologist. Concerns of "blood on tongue when brushing teeth", pt states she will mention to Dr. Janese Banks.

## 2019-07-29 NOTE — Progress Notes (Signed)
I connected with [name] [name] on 07/29/19 at  2:30 PM EDT by telephone visit and verified that I am speaking with the correct person using two identifiers.   I discussed the limitations, risks, security and privacy concerns of performing an evaluation and management service by telemedicine and the availability of in-person appointments. I also discussed with the patient that there may be a patient responsible charge related to this service. The patient expressed understanding and agreed to proceed.  Other persons participating in the visit and their role in the encounter:  none  Patient's location:  home Provider's location:  work  Risk analyst Complaint: Discuss results of blood work  History of present illness: Patient is a 64 year old female with a past medical history significant for COPD, heart failure, diabetes and hypertension among other medical problems.  She has been referred to [place] for leukocytosis prior to weight loss surgery.  Most recent CBC from April 2021 showed white cell count of 13.9, 13.7/43.1 with an MCV of 80 and platelet count of 91.  Differential showed mainly neutropenia with an absolute neutrophil count of 11.  CMP was within normal limits.  TSH was normal.  Looking back at her prior CBCs patient has had chronic neutrophilia/leukocytosis at least dating back to 2014 and her white cell count fluctuates anywhere between 11-20.  Hemoglobin and platelet counts have been relatively normal.  Results of blood work from 07/15/2019 were as follows: CBC showed white count of 14.6, H&H of 13.6/43 and a platelet count of 307.  Iron studies were normal but ferritin was low at 22.  B12 levels were low normal at 253.  Flow cytometry did not show any immunophenotypic abnormality.  Smear review was unremarkable.  Interval history:.  For her weight loss surgery yet as she has to see multiple other doctors before obtaining medical clearance for surgery.  No new complaints since the last  visit   Review of Systems  Constitutional: Negative for chills, fever, malaise/fatigue and weight loss.  HENT: Negative for congestion, ear discharge and nosebleeds.   Eyes: Negative for blurred vision.  Respiratory: Negative for cough, hemoptysis, sputum production, shortness of breath and wheezing.   Cardiovascular: Negative for chest pain, palpitations, orthopnea and claudication.  Gastrointestinal: Negative for abdominal pain, blood in stool, constipation, diarrhea, heartburn, melena, nausea and vomiting.  Genitourinary: Negative for dysuria, flank pain, frequency, hematuria and urgency.  Musculoskeletal: Negative for back pain, joint pain and myalgias.  Skin: Negative for rash.  Neurological: Negative for dizziness, tingling, focal weakness, seizures, weakness and headaches.  Endo/Heme/Allergies: Does not bruise/bleed easily.  Psychiatric/Behavioral: Negative for depression and suicidal ideas. The patient does not have insomnia.     Allergies  Allergen Reactions  . Codeine Itching    Past Medical History:  Diagnosis Date  . Allergic rhinitis   . Allergy   . Asthma   . Back pain   . CHF (congestive heart failure) ([place])   . COPD (chronic obstructive pulmonary disease) ([place])   . Degenerative joint disease of knee, left   . Depression   . Diabetes mellitus without complication ([place])   . Edema   . Elevated lipids   . GERD (gastroesophageal reflux disease)   . Hidradenitis   . History of colonic polyps   . Hypertension   . Hypothyroidism   . IBS (irritable bowel syndrome)   . Insomnia   . Obesity   . Oxygen dependent   . Pneumonia   . PVD (peripheral vascular disease) ([place])   .  Sinusitis, chronic   . Sleep apnea   . Thyroid disease   . Vaginitis, atrophic   . Vertigo   . Vitamin D deficiency     Past Surgical History:  Procedure Laterality Date  . ABDOMINAL HYSTERECTOMY    . AXILLARY HIDRADENITIS EXCISION    . BREAST EXCISIONAL BIOPSY Bilateral 02/7516    RUPTURED FOLLICULAR CYSTS WITH ABSCESSES AND SCARRING, CONSISTENT WITH HIDRADENITIS SUPPURATIVA.   [name] CESAREAN SECTION     x 2  . CHOLECYSTECTOMY    . HEEL SPUR EXCISION N/A   . HYDRADENITIS EXCISION Right 12/31/2015   Procedure: EXCISION HIDRADENITIS AXILLA;  Surgeon: Clayburn Pert, MD;  Location: [facility] ORS;  Service: General;  Laterality: Right;  . KNEE SURGERY Left    1998  . TONSILLECTOMY      Social History   Socioeconomic History  . Marital status: Divorced    Spouse name: Not on file  . Number of children: Not on file  . Years of education: Not on file  . Highest education level: Not on file  Occupational History  . Occupation: disabled  Tobacco Use  . Smoking status: Former Smoker    Quit date: 12/18/1993    Years since quitting: 25.6  . Smokeless tobacco: Never Used  Substance and Sexual Activity  . Alcohol use: No  . Drug use: No  . Sexual activity: Never    Birth control/protection: Abstinence  Other Topics Concern  . Not on file  Social History Narrative  . Not on file   Social Determinants of Health   Financial Resource Strain:   . Difficulty of Paying Living Expenses:   Food Insecurity:   . Worried About Charity fundraiser in the Last Year:   . Arboriculturist in the Last Year:   Transportation Needs:   . Film/video editor (Medical):   [name] Lack of Transportation (Non-Medical):   Physical Activity:   . Days of Exercise per Week:   . Minutes of Exercise per Session:   Stress:   . Feeling of Stress :   Social Connections:   . Frequency of Communication with Friends and Family:   . Frequency of Social Gatherings with Friends and Family:   . Attends Religious Services:   . Active Member of Clubs or Organizations:   . Attends Archivist Meetings:   [name] Marital Status:   Intimate Partner Violence:   . Fear of Current or Ex-Partner:   . Emotionally Abused:   [name] Physically Abused:   . Sexually Abused:     Family History  Problem  Relation Age of Onset  . Rashes / Skin problems Father   . Hypertension Father   . Heart disease Father   . Breast cancer Paternal Grandmother   . COPD Mother   . Kidney cancer Neg Hx   . Bladder Cancer Neg Hx      Current Outpatient Medications:  .  albuterol (PROVENTIL HFA;VENTOLIN HFA) 108 (90 Base) MCG/ACT inhaler, Inhale 2 puffs into the lungs every 6 (six) hours as needed for wheezing or shortness of breath., Disp: , Rfl:  .  albuterol (PROVENTIL) (2.5 MG/3ML) 0.083% nebulizer solution, Take 2.5 mg by nebulization every 6 (six) hours as needed for wheezing or shortness of breath. , Disp: , Rfl:  .  ammonium lactate (AMLACTIN) 12 % cream, Apply topically as needed for dry skin., Disp: , Rfl:  .  aspirin EC 81 MG tablet, Take 81 mg by mouth daily., Disp: ,  Rfl:  .  atorvastatin (LIPITOR) 20 MG tablet, Take 20 mg by mouth at bedtime. , Disp: , Rfl:  .  budesonide-formoterol (SYMBICORT) 160-4.5 MCG/ACT inhaler, Inhale 2 puffs into the lungs 2 (two) times daily., Disp: , Rfl:  .  clindamycin (CLINDAGEL) 1 % gel, Apply 1 application topically 2 (two) times daily., Disp: 30 g, Rfl: 0 .  clobetasol (TEMOVATE) 0.05 % external solution, , Disp: , Rfl:  .  clobetasol (TEMOVATE) 0.05 % external solution, Apply to affected areas scalp once daily as needed for itch. Avoid face, groin, axilla., Disp: 50 mL, Rfl: 2 .  DALIRESP 500 MCG TABS tablet, Take 500 mcg by mouth daily. , Disp: , Rfl: 0 .  doxepin (SINEQUAN) 50 MG capsule, Take 50 mg by mouth at bedtime., Disp: , Rfl:  .  doxycycline (MONODOX) 100 MG capsule, , Disp: , Rfl:  .  doxycycline (PERIOSTAT) 20 MG tablet, Take 1 pill twice a day with food., Disp: 60 tablet, Rfl: 3 .  Dulaglutide (TRULICITY) 7.40 CX/4.4YJ SOPN, Inject into the skin once a week., Disp: , Rfl:  .  DULoxetine (CYMBALTA) 20 MG capsule, Take 20 mg by mouth daily., Disp: , Rfl:  .  fluocinonide (LIDEX) 0.05 % external solution, Apply 1 application topically 2 (two) times  daily., Disp: , Rfl:  .  fluticasone (FLONASE) 50 MCG/ACT nasal spray, , Disp: , Rfl:  .  gabapentin (NEURONTIN) 300 MG capsule, Take 300 mg by mouth 2 (two) times daily. , Disp: , Rfl: 0 .  glucose blood test strip, TEST three times a day, Disp: , Rfl:  .  HUMULIN R U-500 KWIKPEN 500 UNIT/ML injection, Inject 120 Units into the skin 2 (two) times daily with a meal. , Disp: , Rfl: 1 .  ibuprofen (ADVIL,MOTRIN) 800 MG tablet, Take 1 tablet (800 mg total) by mouth every 8 (eight) hours as needed for mild pain or moderate pain (with food)., Disp: 20 tablet, Rfl: 0 .  ketoconazole (NIZORAL) 2 % shampoo, Apply 1 application topically 2 (two) times a week., Disp: , Rfl:  .  Lancets Misc. (ACCU-CHEK FASTCLIX LANCET) KIT, , Disp: , Rfl:  .  levothyroxine (SYNTHROID, LEVOTHROID) 200 MCG tablet, Take 200 mcg by mouth daily before breakfast. , Disp: , Rfl: 1 .  LUTEIN PO, Take 1 tablet by mouth daily., Disp: , Rfl:  .  meclizine (ANTIVERT) 25 MG tablet, Take 25 mg by mouth 3 (three) times daily as needed for dizziness., Disp: , Rfl:  .  Melatonin 5 MG CAPS, Take 2 capsules by mouth daily., Disp: , Rfl:  .  mometasone (NASONEX) 50 MCG/ACT nasal spray, Place 2 sprays into both nostrils daily. , Disp: , Rfl:  .  montelukast (SINGULAIR) 10 MG tablet, Take 10 mg by mouth at bedtime. , Disp: , Rfl: 1 .  nystatin (MYCOSTATIN) 100000 UNIT/ML suspension, , Disp: , Rfl:  .  olopatadine (PATANOL) 0.1 % ophthalmic solution, Place 1 drop into both eyes 2 (two) times daily., Disp: , Rfl:  .  omeprazole (PRILOSEC) 40 MG capsule, Take 40 mg by mouth daily., Disp: , Rfl:  .  potassium citrate (UROCIT-K) 10 MEQ (1080 MG) SR tablet, Take 1 tablet (10 mEq total) by mouth every morning., Disp: 90 tablet, Rfl: 3 .  spironolactone (ALDACTONE) 50 MG tablet, Take 100 mg by mouth daily. , Disp: , Rfl:  .  tacrolimus (PROTOPIC) 0.1 % ointment, Apply topically 2 (two) times daily., Disp: , Rfl:  .  tacrolimus (PROTOPIC)  0.1 %  ointment, Apply topically 2 (two) times daily. To legs, Disp: 100 g, Rfl: 3 .  torsemide (DEMADEX) 20 MG tablet, TAKE  1 TABLETS BY MOUTH daily, Disp: 90 tablet, Rfl: 3 .  traZODone (DESYREL) 50 MG tablet, Take 50-100 mg by mouth at bedtime as needed for sleep. , Disp: , Rfl:  .  triamcinolone cream (KENALOG) 0.1 %, Apply 1 application topically 2 (two) times daily., Disp: , Rfl:  .  TRULICITY 1.5 GY/5.6LS SOPN, Inject 1.5 mg into the skin once a week., Disp: , Rfl:   SLEEP STUDY DOCUMENTS  Result Date: 07/09/2019 Ordered by an unspecified provider.  SLEEP STUDY DOCUMENTS  Result Date: 07/04/2019 Ordered by an unspecified provider.   No images are attached to the encounter.   CMP Latest Ref Rng & Units 06/25/2017  Glucose 65 - 99 mg/dL 113(H)  BUN 6 - 20 mg/dL 16  Creatinine 0.44 - 1.00 mg/dL 0.85  Sodium 135 - 145 mmol/L 136  Potassium 3.5 - 5.1 mmol/L 3.5  Chloride 101 - 111 mmol/L 100(L)  CO2 22 - 32 mmol/L 27  Calcium 8.9 - 10.3 mg/dL 9.1  Total Protein 6.5 - 8.1 g/dL 7.4  Total Bilirubin 0.3 - 1.2 mg/dL 0.6  Alkaline Phos 38 - 126 U/L 122  AST 15 - 41 U/L 19  ALT 14 - 54 U/L 11(L)   CBC Latest Ref Rng & Units 07/15/2019  WBC 4.0 - 10.5 K/uL 14.6(H)  Hemoglobin 12.0 - 15.0 g/dL 13.6  Hematocrit 36.0 - 46.0 % 43.0  Platelets 150 - 400 K/uL 307     Observation/objective: Appears in no acute distress over video visit today.  Breathing is nonlabored  Assessment and plan: Patient is a 64 year old female referred for leukocytosis mainly neutrophilia likely reactive.  Patient has had longstanding leukocytosis/neutrophilia at least dating back to 2014 her white cell count fluctuates between 11-20.  She does not have any evidence of anemia or thrombocytopenia.  Differential mainly shows neutrophilia and flow cytometry did not reveal any immunophenotypic abnormality.  There has been no consistent increase in the trend of her white cell count.  This is therefore not concerning for  malignancy at this time and does not require a bone marrow biopsy.  As such patient can proceed with her weight loss surgery from her leukocytosis standpoint  Patient does have mildly low ferritin as well as B12 levels and have asked her to take oral B12 1000 mcg and oral iron for the same.  Follow-up instructions: Patient will see me in 2 months as scheduled  I discussed the assessment and treatment plan with the patient. The patient was provided an opportunity to ask questions and all were answered. The patient agreed with the plan and demonstrated an understanding of the instructions.   The patient was advised to call back or seek an in-person evaluation if the symptoms worsen or if the condition fails to improve as anticipated.   Visit Diagnosis: 1. Neutrophilia     Dr. Randa Evens, MD, MPH [facility] Tel- [phone] 07/29/2019 1:11 PM

## 2019-08-04 ENCOUNTER — Telehealth: Payer: Self-pay | Admitting: Internal Medicine

## 2019-08-04 DIAGNOSIS — G4733 Obstructive sleep apnea (adult) (pediatric): Secondary | ICD-10-CM

## 2019-08-04 NOTE — Telephone Encounter (Signed)
Called and spoke with patient about her sleep study. Order has been placed for new CPAP start. Patient expressed understanding. Nothing further needed at this time.

## 2019-08-07 ENCOUNTER — Ambulatory Visit
Admission: RE | Admit: 2019-08-07 | Discharge: 2019-08-07 | Disposition: A | Discharge status: Home / Self Care | Payer: Medicare Other | Source: Ambulatory Visit | Attending: Family Medicine | Admitting: Family Medicine

## 2019-08-07 DIAGNOSIS — Z1231 Encounter for screening mammogram for malignant neoplasm of breast: Secondary | ICD-10-CM | POA: Diagnosis present

## 2019-08-09 ENCOUNTER — Other Ambulatory Visit: Payer: Self-pay | Admitting: Dermatology

## 2019-08-27 ENCOUNTER — Other Ambulatory Visit: Payer: Self-pay | Admitting: Family Medicine

## 2019-08-27 DIAGNOSIS — R921 Mammographic calcification found on diagnostic imaging of breast: Secondary | ICD-10-CM

## 2019-08-27 DIAGNOSIS — N6310 Unspecified lump in the right breast, unspecified quadrant: Secondary | ICD-10-CM

## 2019-08-27 DIAGNOSIS — N6320 Unspecified lump in the left breast, unspecified quadrant: Secondary | ICD-10-CM

## 2019-08-27 DIAGNOSIS — R928 Other abnormal and inconclusive findings on diagnostic imaging of breast: Secondary | ICD-10-CM

## 2019-08-29 ENCOUNTER — Ambulatory Visit
Admission: RE | Admit: 2019-08-29 | Discharge: 2019-08-29 | Disposition: A | Discharge status: Home / Self Care | Payer: Medicare Other | Source: Ambulatory Visit | Attending: Family Medicine | Admitting: Family Medicine

## 2019-08-29 DIAGNOSIS — N6320 Unspecified lump in the left breast, unspecified quadrant: Secondary | ICD-10-CM | POA: Diagnosis present

## 2019-08-29 DIAGNOSIS — R921 Mammographic calcification found on diagnostic imaging of breast: Secondary | ICD-10-CM

## 2019-08-29 DIAGNOSIS — N6310 Unspecified lump in the right breast, unspecified quadrant: Secondary | ICD-10-CM | POA: Diagnosis present

## 2019-08-29 DIAGNOSIS — R928 Other abnormal and inconclusive findings on diagnostic imaging of breast: Secondary | ICD-10-CM

## 2019-09-03 ENCOUNTER — Other Ambulatory Visit: Payer: Self-pay | Admitting: Dermatology

## 2019-09-03 ENCOUNTER — Other Ambulatory Visit: Payer: Self-pay

## 2019-09-03 DIAGNOSIS — L219 Seborrheic dermatitis, unspecified: Secondary | ICD-10-CM

## 2019-09-03 MED ORDER — CLOBETASOL PROPIONATE 0.05 % EX SOLN: CUTANEOUS | 0 refills | Status: DC

## 2019-09-03 NOTE — Progress Notes (Signed)
Refill request from [facility] approved

## 2019-09-11 ENCOUNTER — Telehealth: Payer: Self-pay

## 2019-09-11 MED ORDER — CLINDAMYCIN PHOSPHATE 1 % EX GEL: Freq: Two times a day (BID) | CUTANEOUS | 2 refills | Status: AC

## 2019-09-11 NOTE — Telephone Encounter (Signed)
Pt called requesting a refill of Clindamycin gel,  Okay erx'd Clindamycin gel 30 gm 2 RF to [facility] pharmacy

## 2019-09-29 ENCOUNTER — Encounter: Payer: Self-pay | Admitting: Emergency Medicine

## 2019-09-29 ENCOUNTER — Emergency Department: Payer: Medicare Other

## 2019-09-29 ENCOUNTER — Emergency Department
Admission: EM | Admit: 2019-09-29 | Discharge: 2019-09-29 | Disposition: A | Discharge status: Home / Self Care | Payer: Medicare Other | Attending: Emergency Medicine | Admitting: Emergency Medicine

## 2019-09-29 ENCOUNTER — Other Ambulatory Visit: Payer: Self-pay

## 2019-09-29 DIAGNOSIS — Z79899 Other long term (current) drug therapy: Secondary | ICD-10-CM | POA: Diagnosis not present

## 2019-09-29 DIAGNOSIS — Z7982 Long term (current) use of aspirin: Secondary | ICD-10-CM | POA: Insufficient documentation

## 2019-09-29 DIAGNOSIS — I110 Hypertensive heart disease with heart failure: Secondary | ICD-10-CM | POA: Diagnosis not present

## 2019-09-29 DIAGNOSIS — J441 Chronic obstructive pulmonary disease with (acute) exacerbation: Secondary | ICD-10-CM | POA: Diagnosis not present

## 2019-09-29 DIAGNOSIS — I5033 Acute on chronic diastolic (congestive) heart failure: Secondary | ICD-10-CM | POA: Diagnosis not present

## 2019-09-29 DIAGNOSIS — Z794 Long term (current) use of insulin: Secondary | ICD-10-CM | POA: Insufficient documentation

## 2019-09-29 DIAGNOSIS — J45909 Unspecified asthma, uncomplicated: Secondary | ICD-10-CM | POA: Insufficient documentation

## 2019-09-29 DIAGNOSIS — Z87891 Personal history of nicotine dependence: Secondary | ICD-10-CM | POA: Diagnosis not present

## 2019-09-29 DIAGNOSIS — Z7952 Long term (current) use of systemic steroids: Secondary | ICD-10-CM | POA: Insufficient documentation

## 2019-09-29 DIAGNOSIS — E039 Hypothyroidism, unspecified: Secondary | ICD-10-CM | POA: Diagnosis not present

## 2019-09-29 DIAGNOSIS — Z7951 Long term (current) use of inhaled steroids: Secondary | ICD-10-CM | POA: Diagnosis not present

## 2019-09-29 DIAGNOSIS — M79601 Pain in right arm: Secondary | ICD-10-CM | POA: Diagnosis present

## 2019-09-29 DIAGNOSIS — Z79890 Hormone replacement therapy (postmenopausal): Secondary | ICD-10-CM | POA: Diagnosis not present

## 2019-09-29 DIAGNOSIS — M5412 Radiculopathy, cervical region: Secondary | ICD-10-CM | POA: Diagnosis not present

## 2019-09-29 DIAGNOSIS — E119 Type 2 diabetes mellitus without complications: Secondary | ICD-10-CM | POA: Insufficient documentation

## 2019-09-29 LAB — BASIC METABOLIC PANEL
Anion gap: 11 (ref 5–15)
BUN: 12 mg/dL (ref 8–23)
CO2: 25 mmol/L (ref 22–32)
Calcium: 9.2 mg/dL (ref 8.9–10.3)
Chloride: 101 mmol/L (ref 98–111)
Creatinine, Ser: 0.73 mg/dL (ref 0.44–1.00)
GFR calc Af Amer: >60 mL/min (ref >60)
GFR calc non Af Amer: >60 mL/min (ref >60)
Glucose, Bld: 158 mg/dL — ABNORMAL HIGH (ref 70–99)
Potassium: 4.3 mmol/L (ref 3.5–5.1)
Sodium: 137 mmol/L (ref 135–145)

## 2019-09-29 LAB — CBC
HCT: 39.5 % (ref 36.0–46.0)
Hemoglobin: 12.6 g/dL (ref 12.0–15.0)
MCH: 25.9 pg — ABNORMAL LOW (ref 26.0–34.0)
MCHC: 31.9 g/dL (ref 30.0–36.0)
MCV: 81.1 fL (ref 80.0–100.0)
Platelets: 262 10*3/uL (ref 150–400)
RBC: 4.87 MIL/uL (ref 3.87–5.11)
RDW: 14.9 % (ref 11.5–15.5)
WBC: 14.4 10*3/uL — ABNORMAL HIGH (ref 4.0–10.5)
nRBC: 0 % (ref 0.0–0.2)

## 2019-09-29 LAB — TROPONIN I (HIGH SENSITIVITY): Troponin I (High Sensitivity): <2 ng/L (ref <18)

## 2019-09-29 MED ORDER — OXYCODONE-ACETAMINOPHEN 5-325 MG PO TABS: 1.0000 | ORAL_TABLET | Freq: Four times a day (QID) | ORAL | 0 refills | Status: DC | PRN

## 2019-09-29 MED ORDER — MELOXICAM 15 MG PO TABS
15.0000 mg | ORAL_TABLET | Freq: Every day | ORAL | 0 refills | Status: DC
Start: 2019-09-29 — End: 2021-01-17

## 2019-09-29 MED ORDER — KETOROLAC TROMETHAMINE 30 MG/ML IJ SOLN
30.0000 mg | Freq: Once | INTRAMUSCULAR | Status: AC
  Administered 2019-09-29: 30 mg via INTRAMUSCULAR
  Filled 2019-09-29: qty 1

## 2019-09-29 MED ORDER — OXYCODONE-ACETAMINOPHEN 5-325 MG PO TABS
1.0000 | ORAL_TABLET | Freq: Once | ORAL | Status: AC
  Administered 2019-09-29: 1 via ORAL
  Filled 2019-09-29: qty 1

## 2019-09-29 MED ORDER — DEXAMETHASONE SODIUM PHOSPHATE 10 MG/ML IJ SOLN
10.0000 mg | Freq: Once | INTRAMUSCULAR | Status: AC
  Administered 2019-09-29: 10 mg via INTRAMUSCULAR
  Filled 2019-09-29: qty 1

## 2019-09-29 MED ORDER — PREDNISONE 10 MG PO TABS
10.0000 mg | ORAL_TABLET | Freq: Every day | ORAL | 0 refills | Status: DC
Start: 2019-09-29 — End: 2019-12-17

## 2019-09-29 NOTE — ED Provider Notes (Signed)
[facility] Emergency Department Provider Note  ____________________________________________  Time seen: Approximately 8:42 PM  I have reviewed the triage vital signs and the nursing notes.   HISTORY  Chief Complaint right arm pain    HPI [name] [name] is a 64 y.o. female who presents the emergency department with progressively worsening right shoulder and right arm pain.  Patient states that she developed pain in the anterior aspect of her right shoulder approximately a month ago.  This now radiates from her neck down her right arm to the level of the elbow.  No numbness or tingling of the right hand.  No trauma to the neck or shoulder.  Patient denies any headache, visual changes, chest pain, shortness of breath, abdominal pain, nausea or vomiting.  She has a history of CHF, COPD, diabetes, GERD, obesity, sleep apnea, pulmonary hypertension, hypertension.  No complaints of chronic medical issues.         Past Medical History:  Diagnosis Date  . Allergic rhinitis   . Allergy   . Asthma   . Back pain   . CHF (congestive heart failure) ([place])   . COPD (chronic obstructive pulmonary disease) ([place])   . Degenerative joint disease of knee, left   . Depression   . Diabetes mellitus without complication ([place])   . Edema   . Elevated lipids   . GERD (gastroesophageal reflux disease)   . Hidradenitis   . History of colonic polyps   . Hypertension   . Hypothyroidism   . IBS (irritable bowel syndrome)   . Insomnia   . Obesity   . Oxygen dependent   . Pneumonia   . PVD (peripheral vascular disease) ([place])   . Sinusitis, chronic   . Sleep apnea   . Thyroid disease   . Vaginitis, atrophic   . Vertigo   . Vitamin D deficiency     Patient Active Problem List   Diagnosis Date Noted  . Rosacea 05/30/2019  . COPD (chronic obstructive pulmonary disease) ([place]) 08/25/2016  . Acute on chronic heart failure with normal ejection fraction ([place]) 08/20/2016  .  Chronic diastolic heart failure ([place]) 06/02/2016  . Bilateral lower leg cellulitis 05/23/2016  . Leukocytosis 05/23/2016  . Iron deficiency anemia 05/23/2016  . Thrombocytosis ([place]) 05/23/2016  . COPD with acute exacerbation ([place]) 05/21/2016  . Pulmonary hypertension ([place]) 12/14/2015  . Pneumonia 09/08/2015  . Breast abscess 06/02/2015  . Abnormal mammogram of both breasts 02/25/2015  . Hidradenitis 12/30/2014  . Diabetes ([place]) 12/30/2014  . Essential hypertension 12/30/2014  . Asthma 12/30/2014    Past Surgical History:  Procedure Laterality Date  . ABDOMINAL HYSTERECTOMY    . AXILLARY HIDRADENITIS EXCISION    . BREAST EXCISIONAL BIOPSY Bilateral 24/4010   RUPTURED FOLLICULAR CYSTS WITH ABSCESSES AND SCARRING, CONSISTENT WITH HIDRADENITIS SUPPURATIVA.   [name] CESAREAN SECTION     x 2  . CHOLECYSTECTOMY    . HEEL SPUR EXCISION N/A   . HYDRADENITIS EXCISION Right 12/31/2015   Procedure: EXCISION HIDRADENITIS AXILLA;  Surgeon: Clayburn Pert, MD;  Location: [facility] ORS;  Service: General;  Laterality: Right;  . KNEE SURGERY Left    1998  . TONSILLECTOMY      Prior to Admission medications   Medication Sig Start Date End Date Taking? Authorizing Provider  albuterol (PROVENTIL HFA;VENTOLIN HFA) 108 (90 Base) MCG/ACT inhaler Inhale 2 puffs into the lungs every 6 (six) hours as needed for wheezing or shortness of breath.    [provider]  albuterol (PROVENTIL) (2.5 MG/3ML) 0.083% nebulizer solution Take 2.5 mg by nebulization every 6 (six) hours as needed for wheezing or shortness of breath.     [provider]  ammonium lactate (AMLACTIN) 12 % cream Apply topically as needed for dry skin.    [provider]  aspirin EC 81 MG tablet Take 81 mg by mouth daily.    [provider]  atorvastatin (LIPITOR) 20 MG tablet Take 20 mg by mouth at bedtime.     [provider]  budesonide-formoterol (SYMBICORT) 160-4.5 MCG/ACT inhaler Inhale 2 puffs into  the lungs 2 (two) times daily.    [provider]  clindamycin (CLINDAGEL) 1 % gel Apply 1 application topically 2 (two) times daily. 04/05/17   Clayburn Pert, MD  clindamycin (CLINDAGEL) 1 % gel Apply topically in the morning and at bedtime. 09/11/19 09/10/20  Moye, [place], MD  clobetasol (TEMOVATE) 0.05 % external solution  03/21/18   [provider]  clobetasol (TEMOVATE) 0.05 % external solution Apply to affected areas scalp once daily as needed for itch. Avoid face, groin, axilla. 09/03/19   Moye, [place], MD  DALIRESP 500 MCG TABS tablet Take 500 mcg by mouth daily.  03/19/17   [provider]  doxepin (SINEQUAN) 50 MG capsule Take 50 mg by mouth at bedtime.    [provider]  doxycycline (MONODOX) 100 MG capsule  06/19/18   [provider]  doxycycline (PERIOSTAT) 20 MG tablet Take 1 pill twice a day with food. 06/12/19   Moye, [place], MD  Dulaglutide (TRULICITY) 3.82 NK/5.3ZJ SOPN Inject into the skin once a week.    [provider]  DULoxetine (CYMBALTA) 20 MG capsule Take 20 mg by mouth daily.    [provider]  fluocinonide (LIDEX) 0.05 % external solution Apply 1 application topically 2 (two) times daily.    [provider]  fluticasone Asencion Islam) 50 MCG/ACT nasal spray  06/24/18   [provider]  gabapentin (NEURONTIN) 300 MG capsule Take 300 mg by mouth 2 (two) times daily.  03/25/17   [provider]  glucose blood test strip TEST three times a day 12/30/15   [provider]  HUMULIN R U-500 KWIKPEN 500 UNIT/ML injection Inject 120 Units into the skin 2 (two) times daily with a meal.     [provider]  ibuprofen (ADVIL,MOTRIN) 800 MG tablet Take 1 tablet (800 mg total) by mouth every 8 (eight) hours as needed for mild pain or moderate pain (with food). 12/21/16   Eula Listen, MD  ketoconazole (NIZORAL) 2 % shampoo LATHER ON SCALP THREE TIMES PER WEEK, LEAVE ON FOR 8-10  MINUTES THEN RINSE WELL 09/04/19   Moye, [place], MD  Lancets Misc. (ACCU-CHEK FASTCLIX LANCET) KIT  12/27/15   [provider]  levocetirizine (XYZAL) 5 MG tablet TAKE 1 TABLET BY MOUTH ONCE DAILY 08/11/19   Moye, [place], MD  levothyroxine (SYNTHROID, LEVOTHROID) 200 MCG tablet Take 200 mcg by mouth daily before breakfast.     [provider]  LUTEIN PO Take 1 tablet by mouth daily.    [provider]  meclizine (ANTIVERT) 25 MG tablet Take 25 mg by mouth 3 (three) times daily as needed for dizziness.    [provider]  Melatonin 5 MG CAPS Take 2 capsules by mouth daily.    [provider]  meloxicam (MOBIC) 15 MG tablet Take 1 tablet (15 mg total) by mouth daily. 09/29/19   Bushra Denman, Charline Bills, PA-C  mometasone (NASONEX) 50 MCG/ACT nasal spray Place 2 sprays into both nostrils daily.     [provider]  montelukast (SINGULAIR) 10 MG tablet Take 10 mg by mouth at bedtime.     [provider]  nystatin (MYCOSTATIN) 100000 UNIT/ML suspension  06/04/18   [provider]  olopatadine (PATANOL) 0.1 % ophthalmic solution Place 1 drop into both eyes 2 (two) times daily.    [provider]  omeprazole (PRILOSEC) 40 MG capsule Take 40 mg by mouth daily.    [provider]  oxyCODONE-acetaminophen (PERCOCET/ROXICET) 5-325 MG tablet Take 1 tablet by mouth every 6 (six) hours as needed for severe pain. 09/29/19   Cuthriell, Charline Bills, PA-C  potassium citrate (UROCIT-K) 10 MEQ (1080 MG) SR tablet Take 1 tablet (10 mEq total) by mouth every morning. 06/27/19   Alisa Graff, FNP  predniSONE (DELTASONE) 10 MG tablet Take 1 tablet (10 mg total) by mouth daily. 09/29/19   Cuthriell, Charline Bills, PA-C  spironolactone (ALDACTONE) 50 MG tablet Take 100 mg by mouth daily.     [provider]  tacrolimus (PROTOPIC) 0.1 % ointment Apply topically 2 (two) times daily.    [provider]  tacrolimus (PROTOPIC) 0.1 %  ointment Apply topically 2 (two) times daily. To legs 06/12/19   Moye, [place], MD  torsemide (DEMADEX) 20 MG tablet TAKE  1 TABLETS BY MOUTH daily 06/12/19   Darylene Price A, FNP  traZODone (DESYREL) 50 MG tablet Take 50-100 mg by mouth at bedtime as needed for sleep.     [provider]  triamcinolone cream (KENALOG) 0.1 % Apply 1 application topically 2 (two) times daily.    [provider]  TRULICITY 1.5 PJ/0.9TO SOPN Inject 1.5 mg into the skin once a week. 05/02/19   [provider]    Allergies Codeine  Family History  Problem Relation Age of Onset  . Rashes / Skin problems Father   . Hypertension Father   . Heart disease Father   . Breast cancer Paternal Grandmother   . COPD Mother   . Kidney cancer Neg Hx   . Bladder Cancer Neg Hx     Social History Social History   Tobacco Use  . Smoking status: Former Smoker    Quit date: 12/18/1993    Years since quitting: 25.7  . Smokeless tobacco: Never Used  Vaping Use  . Vaping Use: Never used  Substance Use Topics  . Alcohol use: No  . Drug use: No     Review of Systems  Constitutional: No fever/chills Eyes: No visual changes. No discharge ENT: No upper respiratory complaints. Cardiovascular: no chest pain. Respiratory: no cough. No SOB. Gastrointestinal: No abdominal pain.  No nausea, no vomiting.  No diarrhea.  No constipation. Genitourinary: Negative for dysuria. No hematuria Musculoskeletal: Positive for right shoulder and arm pain Skin: Negative for rash, abrasions, lacerations, ecchymosis. Neurological: Negative for headaches, focal weakness or numbness. 10-point ROS otherwise negative.  ____________________________________________   PHYSICAL EXAM:  VITAL SIGNS: ED Triage Vitals  Enc Vitals Group     BP 09/29/19 1736 (!) 142/60     Pulse Rate 09/29/19 1736 88     Resp 09/29/19 1736 20     Temp 09/29/19 1736 98.1 F (36.7 C)     Temp Source 09/29/19 1736 Oral     SpO2  09/29/19 1736 96 %     Weight 09/29/19 1737 270 lb (122.5 kg)     Height 09/29/19 1737 5'  4" (1.626 m)     Head Circumference --      Peak Flow --      Pain Score 09/29/19 1742 10     Pain Loc --      Pain Edu? --      Excl. in [place]? --      Constitutional: Alert and oriented. Well appearing and in no acute distress. Eyes: Conjunctivae are normal. PERRL. EOMI. Head: Atraumatic. ENT:      Ears:       Nose: No congestion/rhinnorhea.      Mouth/Throat: Mucous membranes are moist.  Neck: No stridor.  No cervical spine tenderness to palpation.  Cardiovascular: Normal rate, regular rhythm. Normal S1 and S2.  Good peripheral circulation. Respiratory: Normal respiratory effort without tachypnea or retractions. Lungs CTAB. Good air entry to the bases with no decreased or absent breath sounds. Musculoskeletal: Full range of motion to all extremities. No gross deformities appreciated.  Visualization of the right shoulder reveals no visible signs of trauma.  No edema, erythema.  No warmth of the joint.  Palpation reveals tenderness to palpation along the superior aspect of the shoulder diffusely over the rotator cuff extending into the acromioclavicular joint space and down along the biceps.  No palpable findings or deficits.  Good range of motion to the shoulder.  Palpation reproduces patient's symptoms.  Examination of the elbow, wrist is unremarkable.  Radial pulses sensation intact distally. Neurologic:  Normal speech and language. No gross focal neurologic deficits are appreciated.  Skin:  Skin is warm, dry and intact. No rash noted. Psychiatric: Mood and affect are normal. Speech and behavior are normal. Patient exhibits appropriate insight and judgement.   ____________________________________________   LABS (all labs ordered are listed, but only abnormal results are displayed)  Labs Reviewed  BASIC METABOLIC PANEL - Abnormal; Notable for the following components:      Result Value    Glucose, Bld 158 (*)    All other components within normal limits  CBC - Abnormal; Notable for the following components:   WBC 14.4 (*)    MCH 25.9 (*)    All other components within normal limits  TROPONIN I (HIGH SENSITIVITY)   ____________________________________________  EKG   ____________________________________________  RADIOLOGY I personally viewed and evaluated these images as part of my medical decision making, as well as reviewing the written report by the radiologist.  DG Chest 2 View  Result Date: 09/29/2019 CLINICAL DATA:  Right arm pain radiating to scapula EXAM: CHEST - 2 VIEW COMPARISON:  October 22, 2016 FINDINGS: There is mild cardiomegaly. Aortic knob calcifications are seen. There is prominence of the central pulmonary vasculature. No large airspace consolidation or pleural effusion. The visualized skeletal structures are unremarkable. IMPRESSION: Mild cardiomegaly and mild pulmonary vascular congestion. Electronically Signed   By: Prudencio Pair M.D.   On: 09/29/2019 18:19    ____________________________________________    PROCEDURES  Procedure(s) performed:    Procedures    Medications  ketorolac (TORADOL) 30 MG/ML injection 30 mg (has no administration in time range)  dexamethasone (DECADRON) injection 10 mg (has no administration in time range)  oxyCODONE-acetaminophen (PERCOCET/ROXICET) 5-325 MG per tablet 1 tablet (has no administration in time range)     ____________________________________________   INITIAL IMPRESSION / ASSESSMENT AND PLAN / ED COURSE  Pertinent labs & imaging results that were available during my care of the patient were reviewed by me and considered in my medical decision making (see chart for details).  Review of  the [place] CSRS was performed in accordance of the [place] prior to dispensing any controlled drugs.           Patient's diagnosis is consistent with cervical radiculopathy.  Patient presented to the emergency  department with pain radiating from the right neck down the right arm.  No trauma.  Given the symptoms and patient's history she was evaluated with labs, chest x-ray, troponin.  These are reassuring.  No acute findings.  On exam, patient symptoms were reproduced with palpation.  Given the distribution I feel this is likely cervical radiculopathy.  This is either at the level of the cervical spine or acromioclavicular joint space.  Either way patient will be treated with Toradol, Decadron, Percocet in the emergency department and I will discharge the patient with meloxicam prednisone and limited prescription for Percocet for pain.  Patient had already called orthopedics and has an appointment scheduled for next week.  Symptoms have improved by that point she does not need to keep this appointment, however she is still symptomatic or her symptoms are not improving she should definitely keep this appointment..  Patient is given ED precautions to return to the ED for any worsening or new symptoms.     ____________________________________________  FINAL CLINICAL IMPRESSION(S) / ED DIAGNOSES  Final diagnoses:  Cervical radiculopathy      NEW MEDICATIONS STARTED DURING THIS VISIT:  ED Discharge Orders         Ordered    meloxicam (MOBIC) 15 MG tablet  Daily     Discontinue  Reprint     09/29/19 2038    oxyCODONE-acetaminophen (PERCOCET/ROXICET) 5-325 MG tablet  Every 6 hours PRN     Discontinue  Reprint     09/29/19 2038    predniSONE (DELTASONE) 10 MG tablet  Daily     Discontinue  Reprint    Note to Pharmacy: Take 6 pills x 2 days, 5 pills x 2 days, 4 pills x 2 days, 3 pills x 2 days, 2 pills x 2 days, and 1 pill x 2 days   09/29/19 2038              This chart was dictated using voice recognition software/Dragon. Despite best efforts to proofread, errors can occur which can change the meaning. Any change was purely unintentional.    Brynda Peon 09/29/19 2051     Carrie Mew, MD 09/29/19 2324

## 2019-09-29 NOTE — ED Notes (Signed)
Esign pad nonfunctional. Pt received DC instructions, verbalized understanding, asked questions.

## 2019-09-29 NOTE — ED Notes (Signed)
Pt has chronic pain and has experienced R shoulder pain x 1 month. Pt is f/u w/ ortho in a week. Pt states she woke with intractable pain today and has for past 3 days. Pt able to grasp small objects and use arm in limited manner to help sit up on stretcher. Pt states OTC meds that she is taking at home are not helping w/ the pain.

## 2019-09-29 NOTE — ED Triage Notes (Signed)
Pt here for right shoulder pain that radiates to shoulder blade.  Denies injury. Pain has been for a month but worse over last 3 days.  Always has [place].  Pain not made worse by movement or when using arm for walker.  Keeping pt awake at night.  VSS at this time.

## 2019-09-29 NOTE — ED Notes (Signed)
Does this lady need a repeat trop? Initial < 2.

## 2019-10-06 ENCOUNTER — Inpatient Hospital Stay: Payer: Medicare Other

## 2019-10-06 ENCOUNTER — Inpatient Hospital Stay: Payer: Medicare Other | Admitting: Oncology

## 2019-10-07 ENCOUNTER — Inpatient Hospital Stay: Payer: Medicare Other | Attending: Oncology

## 2019-10-07 ENCOUNTER — Other Ambulatory Visit: Payer: Self-pay

## 2019-10-07 ENCOUNTER — Inpatient Hospital Stay (HOSPITAL_BASED_OUTPATIENT_CLINIC_OR_DEPARTMENT_OTHER): Payer: Medicare Other | Admitting: Oncology

## 2019-10-07 VITALS — BP 148/63 | HR 87 | Temp 98.3°F | Wt 272.6 lb

## 2019-10-07 DIAGNOSIS — E119 Type 2 diabetes mellitus without complications: Secondary | ICD-10-CM | POA: Insufficient documentation

## 2019-10-07 DIAGNOSIS — D709 Neutropenia, unspecified: Secondary | ICD-10-CM | POA: Diagnosis present

## 2019-10-07 DIAGNOSIS — J449 Chronic obstructive pulmonary disease, unspecified: Secondary | ICD-10-CM | POA: Insufficient documentation

## 2019-10-07 DIAGNOSIS — Z7982 Long term (current) use of aspirin: Secondary | ICD-10-CM | POA: Insufficient documentation

## 2019-10-07 DIAGNOSIS — M79601 Pain in right arm: Secondary | ICD-10-CM | POA: Diagnosis not present

## 2019-10-07 DIAGNOSIS — Z7951 Long term (current) use of inhaled steroids: Secondary | ICD-10-CM | POA: Diagnosis not present

## 2019-10-07 DIAGNOSIS — I509 Heart failure, unspecified: Secondary | ICD-10-CM | POA: Diagnosis not present

## 2019-10-07 DIAGNOSIS — D7289 Other specified disorders of white blood cells: Secondary | ICD-10-CM

## 2019-10-07 DIAGNOSIS — Z79899 Other long term (current) drug therapy: Secondary | ICD-10-CM | POA: Insufficient documentation

## 2019-10-07 DIAGNOSIS — T380X5A Adverse effect of glucocorticoids and synthetic analogues, initial encounter: Secondary | ICD-10-CM

## 2019-10-07 DIAGNOSIS — Z9884 Bariatric surgery status: Secondary | ICD-10-CM | POA: Diagnosis not present

## 2019-10-07 DIAGNOSIS — M25529 Pain in unspecified elbow: Secondary | ICD-10-CM | POA: Insufficient documentation

## 2019-10-07 DIAGNOSIS — Z87891 Personal history of nicotine dependence: Secondary | ICD-10-CM | POA: Insufficient documentation

## 2019-10-07 DIAGNOSIS — Z791 Long term (current) use of non-steroidal anti-inflammatories (NSAID): Secondary | ICD-10-CM | POA: Diagnosis not present

## 2019-10-07 DIAGNOSIS — D729 Disorder of white blood cells, unspecified: Secondary | ICD-10-CM

## 2019-10-07 DIAGNOSIS — K219 Gastro-esophageal reflux disease without esophagitis: Secondary | ICD-10-CM | POA: Diagnosis not present

## 2019-10-07 DIAGNOSIS — D72828 Other elevated white blood cell count: Secondary | ICD-10-CM | POA: Diagnosis not present

## 2019-10-07 LAB — CBC WITH DIFFERENTIAL/PLATELET
Abs Immature Granulocytes: 0.38 10*3/uL — ABNORMAL HIGH (ref 0.00–0.07)
Basophils Absolute: 0.1 10*3/uL (ref 0.0–0.1)
Basophils Relative: 0 %
Eosinophils Absolute: 0.1 10*3/uL (ref 0.0–0.5)
Eosinophils Relative: 1 %
HCT: 41.9 % (ref 36.0–46.0)
Hemoglobin: 13.8 g/dL (ref 12.0–15.0)
Immature Granulocytes: 2 %
Lymphocytes Relative: 12 %
Lymphs Abs: 2.7 10*3/uL (ref 0.7–4.0)
MCH: 26.4 pg (ref 26.0–34.0)
MCHC: 32.9 g/dL (ref 30.0–36.0)
MCV: 80.3 fL (ref 80.0–100.0)
Monocytes Absolute: 0.9 10*3/uL (ref 0.1–1.0)
Monocytes Relative: 4 %
Neutro Abs: 17.3 10*3/uL — ABNORMAL HIGH (ref 1.7–7.7)
Neutrophils Relative %: 81 %
Platelets: 275 10*3/uL (ref 150–400)
RBC: 5.22 MIL/uL — ABNORMAL HIGH (ref 3.87–5.11)
RDW: 15.1 % (ref 11.5–15.5)
WBC: 21.4 10*3/uL — ABNORMAL HIGH (ref 4.0–10.5)
nRBC: 0 % (ref 0.0–0.2)

## 2019-10-07 NOTE — Progress Notes (Signed)
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 She has been referred to [place] for leukocytosis prior to weight loss surgery. Most recent CBC from April 2021 showed white cell count of 13.9, 13.7/43.1 with an MCV of 80 and platelet count of 91. Differential showed mainly neutropenia with an absolute neutrophil count of 11. CMP was within normal limits. TSH was normal. Looking back at her prior CBCs patient has had chronic neutrophilia/leukocytosis at least dating back to 2014 and her white cell count fluctuates anywhere between 11-20. Hemoglobin and platelet counts have been relatively normal.  Results of blood work from 07/15/2019 were as follows: CBC showed white count of 14.6, H&H of 13.6/43 and a platelet count of 307.  Iron studies were normal but ferritin was low at 22.  B12 levels were low normal at 253.  Flow cytometry did not show any immunophenotypic abnormality.  Smear review was unremarkable.  Interval history-patient was recently seen in the ER for right arm pain and elbow pain and was discharged on oxycodone tramadol and steroid taper.  She is still presently taking steroids and will be seeing [facility] tomorrow.  ECOG PS-  2 Pain scale- 3   Review of systems- Review of Systems  Constitutional: Positive for malaise/fatigue. Negative for chills, fever and weight loss.  HENT: Negative for congestion, ear discharge and nosebleeds.   Eyes: Negative for blurred vision.  Respiratory: Negative for cough, hemoptysis, sputum production, shortness of breath and wheezing.   Cardiovascular: Positive for leg swelling. Negative for chest pain, palpitations, orthopnea and claudication.  Gastrointestinal: Negative for abdominal pain, blood in stool, constipation, diarrhea, heartburn, melena, nausea and vomiting.  Genitourinary: Negative for dysuria, flank pain, frequency, hematuria and urgency.  Musculoskeletal: Negative for back pain, joint pain and myalgias.  Skin: Negative for rash.  Neurological: Negative for dizziness, tingling, focal weakness, seizures, weakness and headaches.  Endo/Heme/Allergies: Does not bruise/bleed easily.  Psychiatric/Behavioral: Negative for depression and suicidal ideas. The patient does not have insomnia.       Allergies  Allergen Reactions  . Codeine Itching     Past Medical History:  Diagnosis Date  . Allergic rhinitis   . Allergy   . Asthma   . Back pain   . CHF (congestive heart failure) ([place])   . COPD (chronic obstructive pulmonary disease) ([place])   . Degenerative joint disease of knee, left   . Depression   . Diabetes mellitus without complication ([place])   . Edema   . Elevated lipids   . GERD (gastroesophageal reflux disease)   . Hidradenitis   . History of colonic polyps   . Hypertension   . Hypothyroidism   . IBS (irritable bowel syndrome)   . Insomnia   . Obesity   . Oxygen dependent   . Pneumonia   . PVD (peripheral vascular disease) ([place])   .  Sinusitis, chronic   . Sleep apnea   . Thyroid disease   . Vaginitis, atrophic   . Vertigo   . Vitamin D deficiency      Past Surgical History:  Procedure Laterality Date  . ABDOMINAL HYSTERECTOMY    . AXILLARY  HIDRADENITIS EXCISION    . BREAST EXCISIONAL BIOPSY Bilateral 19/1478   RUPTURED FOLLICULAR CYSTS WITH ABSCESSES AND SCARRING, CONSISTENT WITH HIDRADENITIS SUPPURATIVA.   [name] CESAREAN SECTION     x 2  . CHOLECYSTECTOMY    . HEEL SPUR EXCISION N/A   . HYDRADENITIS EXCISION Right 12/31/2015   Procedure: EXCISION HIDRADENITIS AXILLA;  Surgeon: Clayburn Pert, MD;  Location: [facility] ORS;  Service: General;  Laterality: Right;  . KNEE SURGERY Left    1998  . TONSILLECTOMY      Social History   Socioeconomic History  . Marital status: Divorced    Spouse name: Not on file  . Number of children: Not on file  . Years of education: Not on file  . Highest education level: Not on file  Occupational History  . Occupation: disabled  Tobacco Use  . Smoking status: Former Smoker    Quit date: 12/18/1993    Years since quitting: 25.8  . Smokeless tobacco: Never Used  Vaping Use  . Vaping Use: Never used  Substance and Sexual Activity  . Alcohol use: No  . Drug use: No  . Sexual activity: Never    Birth control/protection: Abstinence  Other Topics Concern  . Not on file  Social History Narrative  . Not on file   Social Determinants of Health   Financial Resource Strain:   . Difficulty of Paying Living Expenses:   Food Insecurity:   . Worried About Charity fundraiser in the Last Year:   . Arboriculturist in the Last Year:   Transportation Needs:   . Film/video editor (Medical):   [name] Lack of Transportation (Non-Medical):   Physical Activity:   . Days of Exercise per Week:   . Minutes of Exercise per Session:   Stress:   . Feeling of Stress :   Social Connections:   . Frequency of Communication with Friends and Family:   . Frequency of Social Gatherings with Friends and Family:   . Attends Religious Services:   . Active Member of Clubs or Organizations:   . Attends Archivist Meetings:   [name] Marital Status:   Intimate Partner Violence:   . Fear of Current or  Ex-Partner:   . Emotionally Abused:   [name] Physically Abused:   . Sexually Abused:     Family History  Problem Relation Age of Onset  . Rashes / Skin problems Father   . Hypertension Father   . Heart disease Father   . Breast cancer Paternal Grandmother   . COPD Mother   . Kidney cancer Neg Hx   . Bladder Cancer Neg Hx      Current Outpatient Medications:  .  albuterol (PROVENTIL HFA;VENTOLIN HFA) 108 (90 Base) MCG/ACT inhaler, Inhale 2 puffs into the lungs every 6 (six) hours as needed for wheezing or shortness of breath., Disp: , Rfl:  .  albuterol (PROVENTIL) (2.5 MG/3ML) 0.083% nebulizer solution, Take 2.5 mg by nebulization every 6 (six) hours as needed for wheezing or shortness of breath. , Disp: , Rfl:  .  ammonium lactate (AMLACTIN) 12 % cream, Apply topically as needed for dry skin., Disp: , Rfl:  .  aspirin EC  81 MG tablet, Take 81 mg by mouth daily., Disp: , Rfl:  .  atorvastatin (LIPITOR) 20 MG tablet, Take 20 mg by mouth at bedtime. , Disp: , Rfl:  .  budesonide-formoterol (SYMBICORT) 160-4.5 MCG/ACT inhaler, Inhale 2 puffs into the lungs 2 (two) times daily., Disp: , Rfl:  .  clindamycin (CLINDAGEL) 1 % gel, Apply 1 application topically 2 (two) times daily., Disp: 30 g, Rfl: 0 .  clindamycin (CLINDAGEL) 1 % gel, Apply topically in the morning and at bedtime., Disp: 30 g, Rfl: 2 .  clobetasol (TEMOVATE) 0.05 % external solution, , Disp: , Rfl:  .  clobetasol (TEMOVATE) 0.05 % external solution, Apply to affected areas scalp once daily as needed for itch. Avoid face, groin, axilla., Disp: 50 mL, Rfl: 0 .  DALIRESP 500 MCG TABS tablet, Take 500 mcg by mouth daily. , Disp: , Rfl: 0 .  doxepin (SINEQUAN) 50 MG capsule, Take 50 mg by mouth at bedtime., Disp: , Rfl:  .  doxycycline (MONODOX) 100 MG capsule, , Disp: , Rfl:  .  doxycycline (PERIOSTAT) 20 MG tablet, Take 1 pill twice a day with food., Disp: 60 tablet, Rfl: 3 .  Dulaglutide (TRULICITY) 1.74 YC/1.4GY SOPN, Inject  into the skin once a week., Disp: , Rfl:  .  DULoxetine (CYMBALTA) 20 MG capsule, Take 20 mg by mouth daily., Disp: , Rfl:  .  fluocinonide (LIDEX) 0.05 % external solution, Apply 1 application topically 2 (two) times daily., Disp: , Rfl:  .  fluticasone (FLONASE) 50 MCG/ACT nasal spray, , Disp: , Rfl:  .  gabapentin (NEURONTIN) 300 MG capsule, Take 300 mg by mouth 2 (two) times daily. , Disp: , Rfl: 0 .  glucose blood test strip, TEST three times a day, Disp: , Rfl:  .  HUMULIN R U-500 KWIKPEN 500 UNIT/ML injection, Inject 120 Units into the skin 2 (two) times daily with a meal. , Disp: , Rfl: 1 .  ibuprofen (ADVIL,MOTRIN) 800 MG tablet, Take 1 tablet (800 mg total) by mouth every 8 (eight) hours as needed for mild pain or moderate pain (with food)., Disp: 20 tablet, Rfl: 0 .  ketoconazole (NIZORAL) 2 % shampoo, LATHER ON SCALP THREE TIMES PER WEEK, LEAVE ON FOR 8-10 MINUTES THEN RINSE WELL, Disp: 120 mL, Rfl: 3 .  Lancets Misc. (ACCU-CHEK FASTCLIX LANCET) KIT, , Disp: , Rfl:  .  levocetirizine (XYZAL) 5 MG tablet, TAKE 1 TABLET BY MOUTH ONCE DAILY, Disp: 30 tablet, Rfl: 2 .  levothyroxine (SYNTHROID, LEVOTHROID) 200 MCG tablet, Take 200 mcg by mouth daily before breakfast. , Disp: , Rfl: 1 .  LUTEIN PO, Take 1 tablet by mouth daily., Disp: , Rfl:  .  meclizine (ANTIVERT) 25 MG tablet, Take 25 mg by mouth 3 (three) times daily as needed for dizziness., Disp: , Rfl:  .  Melatonin 5 MG CAPS, Take 2 capsules by mouth daily., Disp: , Rfl:  .  meloxicam (MOBIC) 15 MG tablet, Take 1 tablet (15 mg total) by mouth daily., Disp: 30 tablet, Rfl: 0 .  mometasone (NASONEX) 50 MCG/ACT nasal spray, Place 2 sprays into both nostrils daily. , Disp: , Rfl:  .  montelukast (SINGULAIR) 10 MG tablet, Take 10 mg by mouth at bedtime. , Disp: , Rfl: 1 .  nystatin (MYCOSTATIN) 100000 UNIT/ML suspension, , Disp: , Rfl:  .  olopatadine (PATANOL) 0.1 % ophthalmic solution, Place 1 drop into both eyes 2 (two) times daily.,  Disp: , Rfl:  .  omeprazole (  PRILOSEC) 40 MG capsule, Take 40 mg by mouth daily., Disp: , Rfl:  .  potassium citrate (UROCIT-K) 10 MEQ (1080 MG) SR tablet, Take 1 tablet (10 mEq total) by mouth every morning., Disp: 90 tablet, Rfl: 3 .  predniSONE (DELTASONE) 10 MG tablet, Take 1 tablet (10 mg total) by mouth daily., Disp: 42 tablet, Rfl: 0 .  spironolactone (ALDACTONE) 50 MG tablet, Take 100 mg by mouth daily. , Disp: , Rfl:  .  tacrolimus (PROTOPIC) 0.1 % ointment, Apply topically 2 (two) times daily., Disp: , Rfl:  .  tacrolimus (PROTOPIC) 0.1 % ointment, Apply topically 2 (two) times daily. To legs, Disp: 100 g, Rfl: 3 .  torsemide (DEMADEX) 20 MG tablet, TAKE  1 TABLETS BY MOUTH daily, Disp: 90 tablet, Rfl: 3 .  traZODone (DESYREL) 50 MG tablet, Take 50-100 mg by mouth at bedtime as needed for sleep. , Disp: , Rfl:  .  triamcinolone cream (KENALOG) 0.1 %, Apply 1 application topically 2 (two) times daily., Disp: , Rfl:  .  TRULICITY 1.5 JK/9.3OI SOPN, Inject 1.5 mg into the skin once a week., Disp: , Rfl:  .  oxyCODONE-acetaminophen (PERCOCET/ROXICET) 5-325 MG tablet, Take 1 tablet by mouth every 6 (six) hours as needed for severe pain. (Patient not taking: Reported on 10/07/2019), Disp: 12 tablet, Rfl: 0  Physical exam:  Vitals:   10/07/19 1444  BP: (!) 148/63  Pulse: 87  Temp: 98.3 F (36.8 C)  TempSrc: Oral  SpO2: 97%  Weight: 272 lb 9.6 oz (123.7 kg)   Physical Exam Constitutional:      Appearance: She is obese.     Comments: Sitting in a wheelchair.  Appears in no acute distress  Pulmonary:     Effort: Pulmonary effort is normal.     Breath sounds: Normal breath sounds.  Musculoskeletal:     Right lower leg: Edema present.     Left lower leg: Edema present.     Comments: Chronic bilateral lower extremity edema left greater than right with changes of stasis dermatitis  Skin:    General: Skin is warm and dry.  Neurological:     Mental Status: She is alert and oriented to  person, place, and time.      CMP Latest Ref Rng & Units 09/29/2019  Glucose 70 - 99 mg/dL 158(H)  BUN 8 - 23 mg/dL 12  Creatinine 0.44 - 1.00 mg/dL 0.73  Sodium 135 - 145 mmol/L 137  Potassium 3.5 - 5.1 mmol/L 4.3  Chloride 98 - 111 mmol/L 101  CO2 22 - 32 mmol/L 25  Calcium 8.9 - 10.3 mg/dL 9.2  Total Protein 6.5 - 8.1 g/dL -  Total Bilirubin 0.3 - 1.2 mg/dL -  Alkaline Phos 38 - 126 U/L -  AST 15 - 41 U/L -  ALT 14 - 54 U/L -   CBC Latest Ref Rng & Units 10/07/2019  WBC 4.0 - 10.5 K/uL 21.4(H)  Hemoglobin 12.0 - 15.0 g/dL 13.8  Hematocrit 36 - 46 % 41.9  Platelets 150 - 400 K/uL 275    No images are attached to the encounter.  DG Chest 2 View  Result Date: 09/29/2019 CLINICAL DATA:  Right arm pain radiating to scapula EXAM: CHEST - 2 VIEW COMPARISON:  October 22, 2016 FINDINGS: There is mild cardiomegaly. Aortic knob calcifications are seen. There is prominence of the central pulmonary vasculature. No large airspace consolidation or pleural effusion. The visualized skeletal structures are unremarkable. IMPRESSION: Mild cardiomegaly and mild pulmonary  vascular congestion. Electronically Signed   By: Prudencio Pair M.D.   On: 09/29/2019 18:19     Assessment and plan- Patient is a 64 y.o. female with chronic neutrophilia currently acute on chronic due to steroid use  Patient's white count is elevated at 21.4 with an [place] of 17.  Typically her white count is around 14.  She was discharged on steroids for her elbow pain which I suspect is the cause of acute on chronic neutrophilia.  As such she does not require a bone marrow biopsy at this time.  She will tentatively be undergoing gastric bypass surgery in 6 months.  I will see her prior to that in 6 months with a CBC with differential   Visit Diagnosis 1. Corticosteroid-induced neutrophilia   2. Chronic neutrophilia      Dr. Randa Evens, MD, MPH [facility] at [facility] [phone] 10/07/2019 4:20  PM

## 2019-10-15 ENCOUNTER — Other Ambulatory Visit: Payer: Self-pay | Admitting: Dermatology

## 2019-10-15 ENCOUNTER — Ambulatory Visit: Payer: Medicare Other | Admitting: Dermatology

## 2019-10-20 ENCOUNTER — Ambulatory Visit: Payer: Medicare Other | Admitting: Dermatology

## 2019-11-07 ENCOUNTER — Other Ambulatory Visit: Payer: Self-pay | Admitting: Dermatology

## 2019-11-11 ENCOUNTER — Other Ambulatory Visit: Payer: Self-pay

## 2019-11-11 ENCOUNTER — Ambulatory Visit: Payer: Medicare Other | Attending: Family | Admitting: Family

## 2019-11-11 VITALS — BP 149/65 | HR 89 | Resp 18 | Ht 63.0 in | Wt 273.4 lb

## 2019-11-11 DIAGNOSIS — I5032 Chronic diastolic (congestive) heart failure: Secondary | ICD-10-CM | POA: Insufficient documentation

## 2019-11-11 DIAGNOSIS — Z8249 Family history of ischemic heart disease and other diseases of the circulatory system: Secondary | ICD-10-CM | POA: Diagnosis not present

## 2019-11-11 DIAGNOSIS — K219 Gastro-esophageal reflux disease without esophagitis: Secondary | ICD-10-CM | POA: Insufficient documentation

## 2019-11-11 DIAGNOSIS — Z7952 Long term (current) use of systemic steroids: Secondary | ICD-10-CM | POA: Insufficient documentation

## 2019-11-11 DIAGNOSIS — Z87891 Personal history of nicotine dependence: Secondary | ICD-10-CM | POA: Insufficient documentation

## 2019-11-11 DIAGNOSIS — E1151 Type 2 diabetes mellitus with diabetic peripheral angiopathy without gangrene: Secondary | ICD-10-CM | POA: Insufficient documentation

## 2019-11-11 DIAGNOSIS — E559 Vitamin D deficiency, unspecified: Secondary | ICD-10-CM | POA: Diagnosis not present

## 2019-11-11 DIAGNOSIS — I110 Hypertensive heart disease with heart failure: Secondary | ICD-10-CM | POA: Diagnosis not present

## 2019-11-11 DIAGNOSIS — Z791 Long term (current) use of non-steroidal anti-inflammatories (NSAID): Secondary | ICD-10-CM | POA: Insufficient documentation

## 2019-11-11 DIAGNOSIS — G4733 Obstructive sleep apnea (adult) (pediatric): Secondary | ICD-10-CM | POA: Insufficient documentation

## 2019-11-11 DIAGNOSIS — J449 Chronic obstructive pulmonary disease, unspecified: Secondary | ICD-10-CM | POA: Insufficient documentation

## 2019-11-11 DIAGNOSIS — K589 Irritable bowel syndrome without diarrhea: Secondary | ICD-10-CM | POA: Diagnosis not present

## 2019-11-11 DIAGNOSIS — F329 Major depressive disorder, single episode, unspecified: Secondary | ICD-10-CM | POA: Diagnosis not present

## 2019-11-11 DIAGNOSIS — Z9981 Dependence on supplemental oxygen: Secondary | ICD-10-CM | POA: Diagnosis not present

## 2019-11-11 DIAGNOSIS — Z79899 Other long term (current) drug therapy: Secondary | ICD-10-CM | POA: Diagnosis not present

## 2019-11-11 DIAGNOSIS — Z7951 Long term (current) use of inhaled steroids: Secondary | ICD-10-CM | POA: Insufficient documentation

## 2019-11-11 DIAGNOSIS — J410 Simple chronic bronchitis: Secondary | ICD-10-CM

## 2019-11-11 DIAGNOSIS — Z9049 Acquired absence of other specified parts of digestive tract: Secondary | ICD-10-CM | POA: Diagnosis not present

## 2019-11-11 DIAGNOSIS — Z885 Allergy status to narcotic agent status: Secondary | ICD-10-CM | POA: Diagnosis not present

## 2019-11-11 DIAGNOSIS — Z794 Long term (current) use of insulin: Secondary | ICD-10-CM | POA: Diagnosis not present

## 2019-11-11 DIAGNOSIS — I10 Essential (primary) hypertension: Secondary | ICD-10-CM

## 2019-11-11 DIAGNOSIS — E039 Hypothyroidism, unspecified: Secondary | ICD-10-CM | POA: Diagnosis not present

## 2019-11-11 DIAGNOSIS — Z7982 Long term (current) use of aspirin: Secondary | ICD-10-CM | POA: Insufficient documentation

## 2019-11-11 LAB — GLUCOSE, CAPILLARY: Glucose-Capillary: 129 mg/dL — ABNORMAL HIGH (ref 70–99)

## 2019-11-11 MED ORDER — TORSEMIDE 20 MG PO TABS: ORAL_TABLET | ORAL | 3 refills | Status: DC

## 2019-11-11 MED ORDER — POTASSIUM CITRATE ER 10 MEQ (1080 MG) PO TBCR: 10.0000 meq | EXTENDED_RELEASE_TABLET | Freq: Every morning | ORAL | 3 refills | Status: DC

## 2019-11-11 NOTE — Progress Notes (Signed)
Patient ID: [name] [name], female    DOB: [birth year], 64 y.o.   MRN: [id number]  HPI  [name] [name] is a 64 y/o female with a history of vitamin D deficiency, obstructive sleep apnea, GERD, pneumonia, PVD, DM, depression, DJD, COPD with oxygen, allergies, past tobacco use and chronic heart failure.   Echo report on 06/06/19 reviewed and showed an EF of 60-65%. Echo done on 05/21/16 and showed an EF of 50% along with trivial MR/TR/PR. EF has declined slightly from June 2017.  Was in the ED 09/29/19 due to right shoulder pain with radiation down the arm. Evaluated and released.  She presents today for her follow-up visit with a chief complaint of minimal shortness of breath upon moderate exertion. She describes this as chronic in nature having been present for several years. She has associated fatigue, pedal edema, light-headedness and right should pain along with this. She denies any difficulty sleeping, abdominal distention, palpitations, chest pain, cough or weight gain.   Is starting the process of gastric bypass surgery and will be starting nutrition classes. Developed should pain after receiving covid vaccine and is currently following up with orthopaedics.   Past Medical History:  Diagnosis Date  . Allergic rhinitis   . Allergy   . Asthma   . Back pain   . CHF (congestive heart failure) ([place])   . COPD (chronic obstructive pulmonary disease) ([place])   . Degenerative joint disease of knee, left   . Depression   . Diabetes mellitus without complication ([place])   . Edema   . Elevated lipids   . GERD (gastroesophageal reflux disease)   . Hidradenitis   . History of colonic polyps   . Hypertension   . Hypothyroidism   . IBS (irritable bowel syndrome)   . Insomnia   . Obesity   . Oxygen dependent   . Pneumonia   . PVD (peripheral vascular disease) ([place])   . Sinusitis, chronic   . Sleep apnea   . Thyroid disease   . Vaginitis, atrophic   . Vertigo   . Vitamin D deficiency    Past  Surgical History:  Procedure Laterality Date  . ABDOMINAL HYSTERECTOMY    . AXILLARY HIDRADENITIS EXCISION    . BREAST EXCISIONAL BIOPSY Bilateral 05/2120   RUPTURED FOLLICULAR CYSTS WITH ABSCESSES AND SCARRING, CONSISTENT WITH HIDRADENITIS SUPPURATIVA.   [name] CESAREAN SECTION     x 2  . CHOLECYSTECTOMY    . HEEL SPUR EXCISION N/A   . HYDRADENITIS EXCISION Right 12/31/2015   Procedure: EXCISION HIDRADENITIS AXILLA;  Surgeon: Clayburn Pert, MD;  Location: [facility] ORS;  Service: General;  Laterality: Right;  . KNEE SURGERY Left    1998  . TONSILLECTOMY     Family History  Problem Relation Age of Onset  . Rashes / Skin problems Father   . Hypertension Father   . Heart disease Father   . Breast cancer Paternal Grandmother   . COPD Mother   . Kidney cancer Neg Hx   . Bladder Cancer Neg Hx    Social History   Tobacco Use  . Smoking status: Former Smoker    Quit date: 12/18/1993    Years since quitting: 25.9  . Smokeless tobacco: Never Used  Substance Use Topics  . Alcohol use: No   Allergies  Allergen Reactions  . Codeine Itching   Prior to Admission medications   Medication Sig Start Date End Date Taking? Authorizing Provider  albuterol (PROVENTIL HFA;VENTOLIN HFA) 108 (90 Base) MCG/ACT  inhaler Inhale 2 puffs into the lungs every 6 (six) hours as needed for wheezing or shortness of breath.   Yes [provider]  albuterol (PROVENTIL) (2.5 MG/3ML) 0.083% nebulizer solution Take 2.5 mg by nebulization every 6 (six) hours as needed for wheezing or shortness of breath.    Yes [provider]  ammonium lactate (AMLACTIN) 12 % cream Apply topically as needed for dry skin.   Yes [provider]  aspirin EC 81 MG tablet Take 81 mg by mouth daily.   Yes [provider]  atorvastatin (LIPITOR) 20 MG tablet Take 20 mg by mouth at bedtime.    Yes [provider]  budesonide-formoterol (SYMBICORT) 160-4.5 MCG/ACT inhaler Inhale 2 puffs into the  lungs 2 (two) times daily.   Yes [provider]  clindamycin (CLINDAGEL) 1 % gel Apply 1 application topically 2 (two) times daily. 04/05/17  Yes Clayburn Pert, MD  clindamycin (CLINDAGEL) 1 % gel Apply topically in the morning and at bedtime. 09/11/19 09/10/20 Yes Moye, [place], MD  clobetasol (TEMOVATE) 0.05 % external solution  03/21/18  Yes [provider]  clobetasol (TEMOVATE) 0.05 % external solution Apply to affected areas scalp once daily as needed for itch. Avoid face, groin, axilla. 09/03/19  Yes Moye, [place], MD  DALIRESP 500 MCG TABS tablet Take 500 mcg by mouth daily.  03/19/17  Yes [provider]  doxepin (SINEQUAN) 50 MG capsule Take 50 mg by mouth at bedtime.   Yes [provider]  doxycycline (MONODOX) 100 MG capsule  06/19/18  Yes [provider]  doxycycline (PERIOSTAT) 20 MG tablet Take 1 pill twice a day with food. 06/12/19  Yes Moye, [place], MD  Dulaglutide (TRULICITY) 7.62 UQ/3.3HL SOPN Inject into the skin once a week.   Yes [provider]  DULoxetine (CYMBALTA) 20 MG capsule Take 20 mg by mouth daily.   Yes [provider]  fluocinonide (LIDEX) 0.05 % external solution Apply 1 application topically 2 (two) times daily.   Yes [provider]  fluticasone Asencion Islam) 50 MCG/ACT nasal spray  06/24/18  Yes [provider]  gabapentin (NEURONTIN) 300 MG capsule Take 300 mg by mouth 2 (two) times daily.  03/25/17  Yes [provider]  glucose blood test strip TEST three times a day 12/30/15  Yes [provider]  HUMULIN R U-500 KWIKPEN 500 UNIT/ML injection Inject 120 Units into the skin 2 (two) times daily with a meal.    Yes [provider]  ibuprofen (ADVIL,MOTRIN) 800 MG tablet Take 1 tablet (800 mg total) by mouth every 8 (eight) hours as needed for mild pain or moderate pain (with food). 12/21/16  Yes Eula Listen, MD  ketoconazole (NIZORAL) 2 % shampoo LATHER ON  SCALP THREE TIMES PER WEEK, LEAVE ON FOR 8-10 MINUTES THEN RINSE WELL 09/04/19  Yes Moye, [place], MD  Lancets Misc. (ACCU-CHEK FASTCLIX LANCET) KIT  12/27/15  Yes [provider]  levocetirizine (XYZAL) 5 MG tablet TAKE 1 TABLET BY MOUTH ONCE DAILY 11/11/19  Yes Moye, [place], MD  levothyroxine (SYNTHROID, LEVOTHROID) 200 MCG tablet Take 200 mcg by mouth daily before breakfast.    Yes [provider]  LUTEIN PO Take 1 tablet by mouth daily.   Yes [provider]  meclizine (ANTIVERT) 25 MG tablet Take 25 mg by mouth 3 (three) times daily as needed for dizziness.   Yes [provider]  Melatonin 5 MG CAPS Take 2 capsules by mouth daily.   Yes  [provider]  meloxicam (MOBIC) 15 MG tablet Take 1 tablet (15 mg total) by mouth daily. 09/29/19  Yes Cuthriell, Charline Bills, PA-C  mometasone (NASONEX) 50 MCG/ACT nasal spray Place 2 sprays into both nostrils daily.    Yes [provider]  montelukast (SINGULAIR) 10 MG tablet Take 10 mg by mouth at bedtime.    Yes [provider]  nystatin (MYCOSTATIN) 100000 UNIT/ML suspension  06/04/18  Yes [provider]  olopatadine (PATANOL) 0.1 % ophthalmic solution Place 1 drop into both eyes 2 (two) times daily.   Yes [provider]  omeprazole (PRILOSEC) 40 MG capsule Take 40 mg by mouth daily.   Yes [provider]  oxyCODONE-acetaminophen (PERCOCET/ROXICET) 5-325 MG tablet Take 1 tablet by mouth every 6 (six) hours as needed for severe pain. 09/29/19  Yes Cuthriell, Charline Bills, PA-C  potassium citrate (UROCIT-K) 10 MEQ (1080 MG) SR tablet Take 1 tablet (10 mEq total) by mouth every morning. 11/11/19  Yes Darylene Price A, FNP  predniSONE (DELTASONE) 10 MG tablet Take 1 tablet (10 mg total) by mouth daily. 09/29/19  Yes Cuthriell, Charline Bills, PA-C  spironolactone (ALDACTONE) 50 MG tablet Take 100 mg by mouth daily.    Yes [provider]  tacrolimus (PROTOPIC) 0.1 %  ointment Apply topically 2 (two) times daily.   Yes [provider]  tacrolimus (PROTOPIC) 0.1 % ointment Apply topically 2 (two) times daily. To legs 06/12/19  Yes Moye, [place], MD  torsemide (DEMADEX) 20 MG tablet TAKE  1 TABLETS BY MOUTH daily 11/11/19  Yes Darylene Price A, FNP  traZODone (DESYREL) 50 MG tablet Take 50-100 mg by mouth at bedtime as needed for sleep.    Yes [provider]  triamcinolone cream (KENALOG) 0.1 % Apply 1 application topically 2 (two) times daily.   Yes [provider]  TRULICITY 1.5 IH/4.7QQ SOPN Inject 1.5 mg into the skin once a week. 05/02/19  Yes [provider]    Review of Systems  Constitutional: Positive for fatigue. Negative for appetite change.  HENT: Negative for congestion, rhinorrhea and sore throat.   Eyes: Negative.   Respiratory: Positive for shortness of breath (with minimal exertion). Negative for cough, chest tightness and wheezing.   Cardiovascular: Positive for leg swelling (left side). Negative for chest pain and palpitations.  Gastrointestinal: Negative for abdominal distention and abdominal pain.  Endocrine: Negative.   Genitourinary: Negative.   Musculoskeletal: Positive for arthralgias (right shoulder). Negative for back pain and neck pain.  Skin: Negative.   Allergic/Immunologic: Negative.   Neurological: Positive for light-headedness (with vertigo). Negative for dizziness.  Hematological: Negative for adenopathy. Does not bruise/bleed easily.  Psychiatric/Behavioral: Negative for dysphoric mood and sleep disturbance. The patient is not nervous/anxious.    Vitals:   11/11/19 1359  BP: (!) 149/65  Pulse: 89  Resp: 18  SpO2: 96%  Weight: 273 lb 6 oz (124 kg)  Height: 5' 3"  (1.6 m)   Wt Readings from Last 3 Encounters:  11/11/19 273 lb 6 oz (124 kg)  10/07/19 272 lb 9.6 oz (123.7 kg)  09/29/19 270 lb (122.5 kg)   Lab Results  Component Value Date   CREATININE 0.73 09/29/2019    CREATININE 0.85 06/25/2017   CREATININE 0.89 12/21/2016    Physical Exam Vitals and nursing note reviewed.  Constitutional:      Appearance: She is well-developed.  HENT:     Head: Normocephalic and atraumatic.  Neck:     Vascular: No JVD.  Cardiovascular:     Rate and Rhythm: Normal rate and regular rhythm.  Pulmonary:     Effort: Pulmonary effort is normal.     Breath sounds: No wheezing or rales.  Abdominal:     General: There is no distension.     Palpations: Abdomen is soft.     Tenderness: There is no abdominal tenderness.  Musculoskeletal:        General: No tenderness.     Cervical back: Normal range of motion and neck supple.     Right lower leg: No tenderness. Edema present.     Left lower leg: No tenderness. Edema present.  Skin:    General: Skin is warm and dry.  Neurological:     Mental Status: She is alert and oriented to person, place, and time.  Psychiatric:        Behavior: Behavior normal.        Thought Content: Thought content normal.      Assessment & Plan:  1: Chronic heart failure with preserved ejection fraction without structural changes- - NYHA class II - euvolemic today - weighing daily; reminded to call for an overnight weight gain of 2 lbs or a weekly weight gain of 5 lbs - weight up 2 pounds since her last visit here 6 months ago - not adding salt and is using a salt substitute. Using meals on wheels for food; reminded her to follow a 2000 mg sodium diet - saw cardiology ([place]) 06/23/19 - encouraged her to increase her activity - encouraged her to elevate her legs when sitting for long periods of time; she says edema improves in the morning after being in the bed - reports receiving both covid vaccines - starting the process of going through the education for future gastric bypass surgery  2: HTN- - BP looks good today - follows with PCP at [facility] from 09/29/19 reviewed and showed sodium 137,  potassium 4.3, creatinine 0.73 and GFR >60  3: Diabetes- - fasting glucose in the office was 129 - A1C on 05/21/16 was 7.0% - saw endocrinologist ([place]) 09/12/17   4: COPD- - continue inhalers  - wears oxygen at 2L when at home - saw pulmonologist Elie Confer) 05/12/19   Patient did not bring her medications nor a list. Each medication was verbally reviewed with the patient and she was encouraged to bring the bottles to every visit to confirm accuracy of list.  Due to HF stability, will not make a return appointment for patient at this time. Advised patient that she could call back at any time to schedule another appointment and she was comfortable with that plan.

## 2019-11-11 NOTE — Patient Instructions (Addendum)
Continue weighing daily and call for an overnight weight gain of > 2 pounds or a weekly weight gain of >5 pounds.   Call us in the future if you'd like to schedule another appointment.    Call [facility] (Dr Kate Sable) for future refills of medications. [phone]

## 2019-11-12 ENCOUNTER — Encounter: Payer: Self-pay | Admitting: Family

## 2019-11-13 ENCOUNTER — Other Ambulatory Visit: Payer: Self-pay

## 2019-11-13 ENCOUNTER — Ambulatory Visit (INDEPENDENT_AMBULATORY_CARE_PROVIDER_SITE_OTHER): Payer: Medicare Other | Admitting: Dermatology

## 2019-11-13 DIAGNOSIS — L732 Hidradenitis suppurativa: Secondary | ICD-10-CM

## 2019-11-13 DIAGNOSIS — L719 Rosacea, unspecified: Secondary | ICD-10-CM

## 2019-11-13 DIAGNOSIS — L409 Psoriasis, unspecified: Secondary | ICD-10-CM

## 2019-11-13 DIAGNOSIS — L308 Other specified dermatitis: Secondary | ICD-10-CM

## 2019-11-13 DIAGNOSIS — B078 Other viral warts: Secondary | ICD-10-CM | POA: Diagnosis not present

## 2019-11-13 DIAGNOSIS — L304 Erythema intertrigo: Secondary | ICD-10-CM | POA: Diagnosis not present

## 2019-11-13 MED ORDER — CLINDAMYCIN PHOSPHATE 1 % EX GEL: Freq: Every day | CUTANEOUS | 5 refills | Status: DC

## 2019-11-13 MED ORDER — KETOCONAZOLE 2 % EX CREA: TOPICAL_CREAM | CUTANEOUS | 2 refills | Status: DC

## 2019-11-13 MED ORDER — CLOBETASOL PROPIONATE 0.05 % EX OINT: TOPICAL_OINTMENT | CUTANEOUS | 2 refills | Status: DC

## 2019-11-13 MED ORDER — DOXYCYCLINE HYCLATE 20 MG PO TABS: ORAL_TABLET | ORAL | 2 refills | Status: DC

## 2019-11-13 MED ORDER — HYDROCORTISONE 2.5 % EX CREA: TOPICAL_CREAM | CUTANEOUS | 2 refills | Status: DC

## 2019-11-13 NOTE — Progress Notes (Signed)
Follow-Up Visit   Subjective  [name] [name] is a 64 y.o. female who presents for the following: Follow-up.  Patient here for rash at right side of groin, present for a few months. It does itch and she has not used anything on it.   She also has excessively dry skin bilateral elbows, right hand. She is using Eucerin but it has gotten worse with so much hand washing.  Patient also has bumps on her chest that sometimes itch, present since last visit. She has a knot on her left neck and left breast with some soreness and no drainage. Patient does use clindamycin and is taking spironolactone 50mg  twice daily for HS of the groin.  Patient is not taking doxycycline at this time, she is taking amoxicillin 500mg  three times daily for an abscess on her gum.    The following portions of the chart were reviewed this encounter and updated as appropriate:  Tobacco   Allergies   Meds   Problems   Med Hx   Surg Hx   Fam Hx       Review of Systems:  No other skin or systemic complaints except as noted in HPI or Assessment and Plan.  Objective  Well appearing patient in no apparent distress; mood and affect are within normal limits.  A focused examination was performed including trunk, neck, arms and hands. Relevant physical exam findings are noted in the Assessment and Plan.  Objective  chest x 8, right 3rd finger x 1 (9): Verrucous papules  Objective  chest: Erythematous ill-demarcated slightly scaly plaque  Objective  Left Breast, Left Neck: Open comedones and scars inframammary, neck, axillae and inguinal folds Inflamed nodule at left neck and left breast  Objective  Bilateral elbows, right hand: Well demarcated pink plaques with silvery scale  Objective  Right Inguinal Area: Macerated pink patch   Assessment & Plan  Other viral warts (9) chest x 8, right 3rd finger x 1  Destruction of lesion - chest x 8, right 3rd finger x 1  Destruction method: cryotherapy   Informed  consent: discussed and consent obtained   Lesion destroyed using liquid nitrogen: Yes   Cryotherapy cycles:  2 Outcome: patient tolerated procedure well with no complications   Post-procedure details: wound care instructions given    Other eczema chest  Start clobetasol ointment twice daily to chest for 2 weeks then on weekends only Avoid applying to face, groin, and axilla. Use as directed. Risk of skin atrophy with long-term use reviewed.   Topical steroids (such as triamcinolone, fluocinolone, fluocinonide, mometasone, clobetasol, halobetasol, betamethasone, hydrocortisone) can cause thinning and lightening of the skin if they are used for too long in the same area. Your physician has selected the right strength medicine for your problem and area affected on the body. Please use your medication only as directed by your physician to prevent side effects.    Hidradenitis suppurativa Left Breast, Left Neck  Continue clindamycin and hibaclens daily. Once finished with amoxicillin start doxycycline 20mg  one pill twice daily with food.   Discussed Humira.   Doxycycline should be taken with food to prevent nausea. Do not lay down for 30 minutes after taking. Be cautious with sun exposure and use good sun protection while on this medication. Pregnant women should not take this medication.    Intralesional injection - Left Breast, Left Neck Location: left breast, left neck  Informed Consent: Discussed risks (infection, pain, bleeding, bruising, thinning of the skin, loss of  skin pigment, lack of resolution, and recurrence of lesion) and benefits of the procedure, as well as the alternatives. Informed consent was obtained. Preparation: The area was prepared a standard fashion.  Procedure Details: An intralesional injection was performed with Kenalog 3.3 mg/cc. 0.15 cc at neck and 0.2 at left breast total were injected.  Total number of injections: 2  Plan: The patient was instructed on  post-op care. Recommend OTC analgesia as needed for pain.   Ordered Medications: clindamycin (CLINDAGEL) 1 % gel  Psoriasis Bilateral elbows, right hand  No joint pain/stiffness Start clobetasol ointment twice daily to arms and hands for 2 weeks then on weekends only Avoid applying to face, groin, and axilla. Use as directed. Risk of skin atrophy with long-term use reviewed.  Consider adding calcipotriene.  Topical steroids (such as triamcinolone, fluocinolone, fluocinonide, mometasone, clobetasol, halobetasol, betamethasone, hydrocortisone) can cause thinning and lightening of the skin if they are used for too long in the same area. Your physician has selected the right strength medicine for your problem and area affected on the body. Please use your medication only as directed by your physician to prevent side effects.    Ordered Medications: clobetasol ointment (TEMOVATE) 0.05 %  Erythema intertrigo Right Inguinal Area  Start ketoconazole 2% cream and follow with hydrocortisone 2.5% cream twice daily for 2 weeks as needed for rash at groin area. Continue using anti-fungal over the counter powder.  Ordered Medications: ketoconazole (NIZORAL) 2 % cream hydrocortisone 2.5 % cream  Rosacea  Reordered Medications doxycycline (PERIOSTAT) 20 MG tablet  Return in about 6 weeks (around 12/25/2019) for follow up.  Graciella Belton, RMA, am acting as scribe for Forest Gleason, MD .  Documentation: I have reviewed the above documentation for accuracy and completeness, and I agree with the above.  Forest Gleason, MD

## 2019-11-13 NOTE — Patient Instructions (Addendum)
Cryotherapy Aftercare  . Wash gently with soap and water everyday.   [name] Apply Vaseline and Band-Aid daily until healed.  Prior to procedure, discussed risks of blister formation, small wound, skin dyspigmentation, or rare scar following cryotherapy.   Psoriasis - start clobetasol ointment twice daily to arms and hands for 2 weeks then on weekends only  Eczema - start clobetasol ointment twice daily to chest for 2 weeks then on weekends only  Intertrigo - start ketoconazole 2% cream and follow with hydrocortisone 2.5% cream twice daily for 2 weeks as needed for rash at groin area. Continue using anti-fungal over the counter powder.  HS - continue clindamycin and hibaclens daily. Once finished with amoxicillin start doxycycline 20mg  one pill twice daily with food.   Topical steroids (such as triamcinolone, fluocinolone, fluocinonide, mometasone, clobetasol, halobetasol, betamethasone, hydrocortisone) can cause thinning and lightening of the skin if they are used for too long in the same area. Your physician has selected the right strength medicine for your problem and area affected on the body. Please use your medication only as directed by your physician to prevent side effects.   Start doxycycline 20 mg twice daily after finishing amoxicillin. Doxycycline should be taken with food to prevent nausea. Do not lay down for 30 minutes after taking. Be cautious with sun exposure and use good sun protection while on this medication. Pregnant women should not take this medication.

## 2019-11-14 ENCOUNTER — Encounter: Payer: Self-pay | Admitting: Dermatology

## 2019-11-14 MED ORDER — CLOBETASOL PROPIONATE 0.05 % EX OINT: TOPICAL_OINTMENT | CUTANEOUS | 0 refills | Status: DC

## 2019-12-01 ENCOUNTER — Other Ambulatory Visit: Payer: Self-pay | Admitting: Dermatology

## 2019-12-04 ENCOUNTER — Other Ambulatory Visit: Payer: Self-pay | Admitting: Dermatology

## 2019-12-04 DIAGNOSIS — L219 Seborrheic dermatitis, unspecified: Secondary | ICD-10-CM

## 2019-12-08 ENCOUNTER — Telehealth: Payer: Self-pay

## 2019-12-08 NOTE — Telephone Encounter (Signed)
Patient called the office but unable to get through/leave message. She then paged Dr. Laurence Ferrari.   Reached out to patient and she just wanted to let Dr. Laurence Ferrari know the medications she is using is not helping the condition on her legs. Patient states the Ketoconazole and Hydrocortisone do nothing for her. Offered patient to come in at 8:30 Wednesday morning to see Dr. Laurence Ferrari since she had a opening but patient declined. Patient would like a call back after lunch time on Wednesday when Dr. Laurence Ferrari returns to the office.

## 2019-12-10 NOTE — Telephone Encounter (Signed)
Received page from patient around 8 PM. Called back. Her rash is persistent at low abdomen. Unfortunately I think we need to see this in clinic for evaluation since it's not responding as expected.  We will see her at 1:45 PM tomorrow.

## 2019-12-10 NOTE — Telephone Encounter (Signed)
Called patient back. No answer. No voicemail. Will try back tomorrow.

## 2019-12-11 ENCOUNTER — Ambulatory Visit (INDEPENDENT_AMBULATORY_CARE_PROVIDER_SITE_OTHER): Payer: Medicare Other | Admitting: Dermatology

## 2019-12-11 ENCOUNTER — Other Ambulatory Visit: Payer: Self-pay

## 2019-12-11 DIAGNOSIS — L409 Psoriasis, unspecified: Secondary | ICD-10-CM | POA: Diagnosis not present

## 2019-12-11 MED ORDER — LEVOCETIRIZINE DIHYDROCHLORIDE 5 MG PO TABS: 5.0000 mg | ORAL_TABLET | Freq: Every day | ORAL | 1 refills | Status: DC

## 2019-12-11 NOTE — Patient Instructions (Addendum)
For psoriasis on the wrist, elbows, and upper legs start clobetasol ointment twice a day up to 2 weeks then on weekends only. Avoid applying to face, groin, and axilla. Use as directed. Risk of skin atrophy with long-term use reviewed.   Topical steroids (such as triamcinolone, fluocinolone, fluocinonide, mometasone, clobetasol, halobetasol, betamethasone, hydrocortisone) can cause thinning and lightening of the skin if they are used for too long in the same area. Your physician has selected the right strength medicine for your problem and area affected on the body. Please use your medication only as directed by your physician to prevent side effects.

## 2019-12-11 NOTE — Progress Notes (Signed)
   Follow-Up Visit   Subjective  [name] [name] is a 64 y.o. female who presents for the following: Rash.  Patient with rash at upper legs. She has been using hydrocortisone and ketoconazole to treat.   The following portions of the chart were reviewed this encounter and updated as appropriate:  Tobacco  Allergies  Meds  Problems  Med Hx  Surg Hx  Fam Hx      Review of Systems:  No other skin or systemic complaints except as noted in HPI or Assessment and Plan.  Objective  Well appearing patient in no apparent distress; mood and affect are within normal limits.  A focused examination was performed including legs, arms, scalp. Relevant physical exam findings are noted in the Assessment and Plan.  Objective  Right Medial Thigh: Right wrist, bilateral elbows, thighs scaly pink plaques   Assessment & Plan  Psoriasis Right Medial Thigh  Chronic, flared  BSA 6%  Start clobetasol ointment twice a day up to 2 weeks then on weekends only. Avoid applying to face, groin, and axilla. Use as directed. Risk of skin atrophy with long-term use reviewed.   Topical steroids (such as triamcinolone, fluocinolone, fluocinonide, mometasone, clobetasol, halobetasol, betamethasone, hydrocortisone) can cause thinning and lightening of the skin if they are used for too long in the same area. Your physician has selected the right strength medicine for your problem and area affected on the body. Please use your medication only as directed by your physician to prevent side effects.   Some stiffness in joints upon wakening that lasts about 15 minutes.  Favor osteoarthritis but will refer to rheumatology for evaluation.  Reviewed association with heart disease, inflammatory bowel disease, depression, and psoriatic arthritis.  Reviewed need for early diagnosis of psoriatic arthritis to prevent joint damage as joint damage cannot be reversed.   clobetasol ointment (TEMOVATE) 0.05 % - Right Medial  Thigh  Reordered Medications levocetirizine (XYZAL) 5 MG tablet  Return today (on 12/11/2019), or as scheduled, for psoriasis follow up.  Graciella Belton, RMA, am acting as scribe for Forest Gleason, MD .  Documentation: I have reviewed the above documentation for accuracy and completeness, and I agree with the above.  Forest Gleason, MD

## 2019-12-15 ENCOUNTER — Other Ambulatory Visit: Payer: Self-pay

## 2019-12-15 ENCOUNTER — Encounter: Payer: Self-pay | Admitting: Dermatology

## 2019-12-15 DIAGNOSIS — L409 Psoriasis, unspecified: Secondary | ICD-10-CM

## 2019-12-17 ENCOUNTER — Ambulatory Visit (INDEPENDENT_AMBULATORY_CARE_PROVIDER_SITE_OTHER): Payer: Medicare Other | Admitting: Internal Medicine

## 2019-12-17 ENCOUNTER — Encounter: Payer: Self-pay | Admitting: Internal Medicine

## 2019-12-17 ENCOUNTER — Ambulatory Visit: Payer: Medicare Other | Admitting: Internal Medicine

## 2019-12-17 ENCOUNTER — Other Ambulatory Visit: Payer: Self-pay

## 2019-12-17 VITALS — BP 144/62 | HR 82 | Temp 96.6°F | Ht 63.0 in | Wt 272.6 lb

## 2019-12-17 DIAGNOSIS — G4733 Obstructive sleep apnea (adult) (pediatric): Secondary | ICD-10-CM | POA: Diagnosis not present

## 2019-12-17 DIAGNOSIS — J449 Chronic obstructive pulmonary disease, unspecified: Secondary | ICD-10-CM

## 2019-12-17 NOTE — Progress Notes (Signed)
SYNOPSIS [name] [name] is a 64 y.o. female former smoker ( 35 pack year history , quit 1995 ) with history of  COPD , allergies, and sleep apnea per in lab sleep study done 4-5 years ago. She had a CPAP machine, but she could not tolerate it. Consult to evaluate if pt. Is still + for OSA.    CC  follow up OSA Follow up COPD  HPI COPD stable with inhalers No exacerbation at this time No evidence of heart failure at this time No evidence or signs of infection at this time No respiratory distress No fevers, chills, nausea, vomiting, diarrhea No evidence of lower extremity edema No evidence hemoptysis   SEVERE OSA Excellent compliance  100% compliance for days 97 compliance for greater than 4 hours Auto CPAP 8-18 AHI down to 1.4 Previous AHI was 60 in supine position   Past Medical History:  Diagnosis Date  . Allergic rhinitis   . Allergy   . Asthma   . Back pain   . CHF (congestive heart failure) ([place])   . COPD (chronic obstructive pulmonary disease) ([place])   . Degenerative joint disease of knee, left   . Depression   . Diabetes mellitus without complication ([place])   . Edema   . Elevated lipids   . GERD (gastroesophageal reflux disease)   . Hidradenitis   . History of colonic polyps   . Hypertension   . Hypothyroidism   . IBS (irritable bowel syndrome)   . Insomnia   . Obesity   . Oxygen dependent   . Pneumonia   . PVD (peripheral vascular disease) ([place])   . Sinusitis, chronic   . Sleep apnea   . Thyroid disease   . Vaginitis, atrophic   . Vertigo   . Vitamin D deficiency       Test Results: 05/2016 Echo Borderline left ventricle systolic function with left ventricle  ejection fraction approximately 50% visually although quality of  the study was suboptimal. There is wall motion abnormality  suggestive of coronary artery disease and mild diastolic  dysfunction.   Sleep Study   CBC Latest Ref Rng & Units 10/07/2019 09/29/2019 07/15/2019    WBC 4.0 - 10.5 K/uL 21.4(H) 14.4(H) 14.6(H)  Hemoglobin 12.0 - 15.0 g/dL 13.8 12.6 13.6  Hematocrit 36 - 46 % 41.9 39.5 43.0  Platelets 150 - 400 K/uL 275 262 307    BMP Latest Ref Rng & Units 09/29/2019 06/25/2017 12/21/2016  Glucose 70 - 99 mg/dL 158(H) 113(H) 90  BUN 8 - 23 mg/dL 12 16 13   Creatinine 0.44 - 1.00 mg/dL 0.73 0.85 0.89  BUN/Creat Ratio 12 - 28 - - -  Sodium 135 - 145 mmol/L 137 136 140  Potassium 3.5 - 5.1 mmol/L 4.3 3.5 3.7  Chloride 98 - 111 mmol/L 101 100(L) 100(L)  CO2 22 - 32 mmol/L 25 27 29   Calcium 8.9 - 10.3 mg/dL 9.2 9.1 9.4    BNP    Component Value Date/Time   BNP 36.0 05/20/2016 2155    ProBNP No results found for: PROBNP  PFT No results found for: FEV1PRE, FEV1POST, FVCPRE, FVCPOST, TLC, DLCOUNC, PREFEV1FVCRT, PSTFEV1FVCRT  No results found.   Past medical hx Past Medical History:  Diagnosis Date  . Allergic rhinitis   . Allergy   . Asthma   . Back pain   . CHF (congestive heart failure) ([place])   . COPD (chronic obstructive pulmonary disease) ([place])   . Degenerative joint disease of  knee, left   . Depression   . Diabetes mellitus without complication ([place])   . Edema   . Elevated lipids   . GERD (gastroesophageal reflux disease)   . Hidradenitis   . History of colonic polyps   . Hypertension   . Hypothyroidism   . IBS (irritable bowel syndrome)   . Insomnia   . Obesity   . Oxygen dependent   . Pneumonia   . PVD (peripheral vascular disease) ([place])   . Sinusitis, chronic   . Sleep apnea   . Thyroid disease   . Vaginitis, atrophic   . Vertigo   . Vitamin D deficiency      Social History   Tobacco Use  . Smoking status: Former Smoker    Packs/day: 3.00    Years: 20.00    Pack years: 60.00    Types: Cigarettes    Quit date: 12/18/1993    Years since quitting: 26.0  . Smokeless tobacco: Never Used  Vaping Use  . Vaping Use: Never used  Substance Use Topics  . Alcohol use: No  . Drug use: No    Ms.Schlick reports that  she quit smoking about 26 years ago. Her smoking use included cigarettes. She has a 60.00 pack-year smoking history. She has never used smokeless tobacco. She reports that she does not drink alcohol and does not use drugs.  Tobacco Cessation: Former smoker with a 35 pack year smoking history  Past surgical hx, Family hx, Social hx all reviewed.  Current Outpatient Medications on File Prior to Visit  Medication Sig  . albuterol (PROVENTIL HFA;VENTOLIN HFA) 108 (90 Base) MCG/ACT inhaler Inhale 2 puffs into the lungs every 6 (six) hours as needed for wheezing or shortness of breath.  [name] albuterol (PROVENTIL) (2.5 MG/3ML) 0.083% nebulizer solution Take 2.5 mg by nebulization every 6 (six) hours as needed for wheezing or shortness of breath.   [name] ammonium lactate (AMLACTIN) 12 % cream Apply topically as needed for dry skin.  [name] aspirin EC 81 MG tablet Take 81 mg by mouth daily.  [name] atorvastatin (LIPITOR) 20 MG tablet Take 20 mg by mouth at bedtime.   . budesonide-formoterol (SYMBICORT) 160-4.5 MCG/ACT inhaler Inhale 2 puffs into the lungs 2 (two) times daily.  . clindamycin (CLINDAGEL) 1 % gel Apply 1 application topically 2 (two) times daily.  . clindamycin (CLINDAGEL) 1 % gel Apply topically in the morning and at bedtime.  . clindamycin (CLINDAGEL) 1 % gel Apply topically daily.  . clobetasol (TEMOVATE) 0.05 % external solution   . clobetasol (TEMOVATE) 0.05 % external solution Apply to affected areas scalp once daily as needed for itch. Avoid face, groin, axilla.  . clobetasol ointment (TEMOVATE) 0.05 % Apply to affected areas at chest, arms and hands twice daily for 2 weeks then only on weekends as needed. Avoid applying to face, groin, and axilla. Use as directed. Risk of skin atrophy with long-term use reviewed.  . clobetasol ointment (TEMOVATE) 0.05 % APPLY TWICE A DAY AS NEEDED TO RASH ON CHEST,ARMS, LEGS FOR UP TO 2 WEEKS AND THEN WEEKENDS ONLY. AVOID APPLYING TO FACE,GROIN AND AXILIA.  [name]  DALIRESP 500 MCG TABS tablet Take 500 mcg by mouth daily.   [name] doxepin (SINEQUAN) 50 MG capsule Take 50 mg by mouth at bedtime.  [name] doxycycline (MONODOX) 100 MG capsule   . doxycycline (PERIOSTAT) 20 MG tablet Take 1 pill twice a day with food.  . Dulaglutide (TRULICITY) 1.85 UD/1.4HF SOPN Inject into the skin once  a week.  . DULoxetine (CYMBALTA) 20 MG capsule Take 20 mg by mouth daily.  . fluocinonide (LIDEX) 0.05 % external solution Apply 1 application topically 2 (two) times daily.  . fluticasone (FLONASE) 50 MCG/ACT nasal spray   . gabapentin (NEURONTIN) 300 MG capsule Take 300 mg by mouth 2 (two) times daily.   [name] glucose blood test strip TEST three times a day  . HUMULIN R U-500 KWIKPEN 500 UNIT/ML injection Inject 120 Units into the skin 2 (two) times daily with a meal.   . hydrocortisone 2.5 % cream Apply after ketoconazole cream twice daily for 2 weeks to areas at lower abdomen then as needed for rash.  . ibuprofen (ADVIL,MOTRIN) 800 MG tablet Take 1 tablet (800 mg total) by mouth every 8 (eight) hours as needed for mild pain or moderate pain (with food).  [name] ketoconazole (NIZORAL) 2 % cream Apply twice daily for 2 weeks then as needed for rash to areas at lower abdomen.  [name] ketoconazole (NIZORAL) 2 % shampoo LATHER ON SCALP THREE TIMES PER WEEK, LEAVE ON FOR 8-10 MINUTES THEN RINSE WELL  . Lancets Misc. (ACCU-CHEK FASTCLIX LANCET) KIT   . levocetirizine (XYZAL) 5 MG tablet Take 1 tablet (5 mg total) by mouth daily.  [name] levothyroxine (SYNTHROID, LEVOTHROID) 200 MCG tablet Take 200 mcg by mouth daily before breakfast.   . LUTEIN PO Take 1 tablet by mouth daily.  . meclizine (ANTIVERT) 25 MG tablet Take 25 mg by mouth 3 (three) times daily as needed for dizziness.  . Melatonin 5 MG CAPS Take 2 capsules by mouth daily.  . meloxicam (MOBIC) 15 MG tablet Take 1 tablet (15 mg total) by mouth daily.  . mometasone (NASONEX) 50 MCG/ACT nasal spray Place 2 sprays into both nostrils daily.   .  montelukast (SINGULAIR) 10 MG tablet Take 10 mg by mouth at bedtime.   [name] nystatin (MYCOSTATIN) 100000 UNIT/ML suspension   . olopatadine (PATANOL) 0.1 % ophthalmic solution Place 1 drop into both eyes 2 (two) times daily.  [name] omeprazole (PRILOSEC) 40 MG capsule Take 40 mg by mouth daily.  [name] oxyCODONE-acetaminophen (PERCOCET/ROXICET) 5-325 MG tablet Take 1 tablet by mouth every 6 (six) hours as needed for severe pain.  . potassium citrate (UROCIT-K) 10 MEQ (1080 MG) SR tablet Take 1 tablet (10 mEq total) by mouth every morning.  [name] spironolactone (ALDACTONE) 50 MG tablet Take 100 mg by mouth daily.   . tacrolimus (PROTOPIC) 0.1 % ointment Apply topically 2 (two) times daily.  . tacrolimus (PROTOPIC) 0.1 % ointment Apply topically 2 (two) times daily. To legs  . torsemide (DEMADEX) 20 MG tablet TAKE  1 TABLETS BY MOUTH daily  . traZODone (DESYREL) 50 MG tablet Take 50-100 mg by mouth at bedtime as needed for sleep.   [name] triamcinolone cream (KENALOG) 0.1 % Apply 1 application topically 2 (two) times daily.  . TRULICITY 1.5 JJ/9.4RD SOPN Inject 1.5 mg into the skin once a week.   No current facility-administered medications on file prior to visit.     Allergies  Allergen Reactions  . Codeine Itching    Review of Systems:  Gen:  Denies  fever, sweats, chills weight loss  HEENT: Denies blurred vision, double vision, ear pain, eye pain, hearing loss, nose bleeds, sore throat Cardiac:  No dizziness, chest pain or heaviness, chest tightness,edema, No JVD Resp:   No cough, -sputum production, -shortness of breath,-wheezing, -hemoptysis,  Gi: Denies swallowing difficulty, stomach pain, nausea or vomiting, diarrhea, constipation, bowel  incontinence Gu:  Denies bladder incontinence, burning urine Ext:   Denies Joint pain, stiffness or swelling Skin: Denies  skin rash, easy bruising or bleeding or hives Endoc:  Denies polyuria, polydipsia , polyphagia or weight change Psych:   Denies depression,  insomnia or hallucinations  Other:  All other systems negative    Vital Signs BP (!) 144/62 (BP Location: Left Wrist, Cuff Size: Normal)   Pulse 82   Temp (!) 96.6 F (35.9 C) (Temporal)   Ht 5' 3"  (1.6 m)   Wt 272 lb 9.6 oz (123.7 kg)   SpO2 96%   BMI 48.29 kg/m    Physical Examination:   General Appearance: No distress  Neuro:without focal findings,  speech normal,  HEENT: PERRLA, EOM intact.   Pulmonary: normal breath sounds, No wheezing.  CardiovascularNormal S1,S2.  No m/r/g.   Abdomen: Benign, Soft, non-tender. Renal:  No costovertebral tenderness  GU:  Not performed at this time. Endoc: No evident thyromegaly Skin:   warm, no rashes, no ecchymosis  Extremities: normal, no cyanosis, clubbing. PSYCHIATRIC: Mood, affect within normal limits.   ALL OTHER ROS ARE NEGATIVE   Assessment/Plan   SEVERE OSA AHI 60 Well controlled now with AUTOCPAP 8-18 AHI down to 1.4 Excellent compliance report    Nocturnal Hypoxemia  Continue oxygen as prescribed Patient uses and benefits from oxygen therapy  COPD In the Symbicort as prescribed Rinse mouth after use Albuterol as needed No signs of infection at this time   Obesity -recommend significant weight loss -recommend changing diet  Deconditioned state -Recommend increased daily activity and exercise    COVID-19 EDUCATION: The signs and symptoms of COVID-19 were discussed with the patient and how to seek care for testing.  The importance of social distancing was discussed today. Hand Washing Techniques and avoid touching face was advised.     MEDICATION ADJUSTMENTS/LABS AND TESTS ORDERED: Continue CPAP as prescribed Continue inhalers as prescribed   CURRENT MEDICATIONS REVIEWED AT LENGTH WITH PATIENT TODAY   Patient satisfied with Plan of action and management. All questions answered  Follow up in 1 year  Total time spent 24 mins   Elina Streng Patricia Pesa, M.D.  [facility]  Medical Director [place] Director [facility] Cardio-Pulmonary Department

## 2019-12-17 NOTE — Patient Instructions (Addendum)
Continue CPAP as prescribed Continue inhalers as prescribed  A+++ EXCELLENT JOB!!KEEP UP THE GREAT WORK!!!

## 2019-12-22 ENCOUNTER — Telehealth: Payer: Self-pay

## 2019-12-22 NOTE — Telephone Encounter (Signed)
Called [facility] to follow up on patient's referral. They called patient 9/27 and had no answer, no VM and then left message for patient on 12/16/19 to return call to their office to schedule appt.

## 2019-12-30 NOTE — Telephone Encounter (Signed)
Called patient and left message to return our call. As of yesterday 12/29/19, patient still has not called Rheumatology to schedule appt.

## 2020-01-01 ENCOUNTER — Other Ambulatory Visit: Payer: Self-pay

## 2020-01-01 ENCOUNTER — Ambulatory Visit (INDEPENDENT_AMBULATORY_CARE_PROVIDER_SITE_OTHER): Payer: Medicare Other | Admitting: Dermatology

## 2020-01-01 DIAGNOSIS — L821 Other seborrheic keratosis: Secondary | ICD-10-CM | POA: Diagnosis not present

## 2020-01-01 DIAGNOSIS — L732 Hidradenitis suppurativa: Secondary | ICD-10-CM | POA: Diagnosis not present

## 2020-01-01 DIAGNOSIS — I872 Venous insufficiency (chronic) (peripheral): Secondary | ICD-10-CM

## 2020-01-01 DIAGNOSIS — L409 Psoriasis, unspecified: Secondary | ICD-10-CM | POA: Diagnosis not present

## 2020-01-01 MED ORDER — CALCIPOTRIENE 0.005 % EX CREA: TOPICAL_CREAM | Freq: Two times a day (BID) | CUTANEOUS | 0 refills | Status: DC

## 2020-01-01 NOTE — Patient Instructions (Addendum)
For psoriasis at legs- Continue clobetasol 0.05% ointment twice daily on weekends only. Avoid applying to face, groin, and axilla. Use as directed. Risk of skin atrophy with long-term use reviewed.  Start calcipotriene twice daily 7 days a week.

## 2020-01-01 NOTE — Progress Notes (Signed)
   Follow-Up Visit   Subjective  [name] [name] is a 64 y.o. female who presents for the following: Follow-up (Patient here today for psoriasis follow up. ).  Patient using clobetasol ointment and taking xyzal. Patient advises it is improved. She said she is scheduled with rheumatology but does not have the date with her.   The following portions of the chart were reviewed this encounter and updated as appropriate:  Tobacco  Allergies  Meds  Problems  Med Hx  Surg Hx  Fam Hx      Review of Systems:  No other skin or systemic complaints except as noted in HPI or Assessment and Plan.  Objective  Well appearing patient in no apparent distress; mood and affect are within normal limits.  A focused examination was performed including face, neck, chest and back and arms, legs. Relevant physical exam findings are noted in the Assessment and Plan.  Objective  Right Medial Thigh: Scaly pink plaques at inner thighs, hands  Objective  Lower Legs: 2+ pedal pulses; pretibial erythema  Objective  Axillae, neck: Scars, no active nodules today   Assessment & Plan  Psoriasis Right Medial Thigh  Chronic, improved but not at goal Awaiting eval with rheumatology  Continue clobetasol 0.05% ointment twice daily on weekends only. Avoid applying to face, groin, and axilla. Use as directed. Risk of skin atrophy with long-term use reviewed.   Start calcipotriene twice daily 7 days a week.    clobetasol ointment (TEMOVATE) 0.05 % - Right Medial Thigh  levocetirizine (XYZAL) 5 MG tablet - Right Medial Thigh  calcipotriene (DOVONOX) 0.005 % cream - Right Medial Thigh  Venous stasis dermatitis of left lower extremity Lower Legs  Chronic Adequate control  Recommend compression socks daily. Can use clobetasol twice a day as needed for flares no more than 2 weeks. Avoid applying to face, groin, and axilla. Use as directed. Risk of skin atrophy with long-term use reviewed.     Hidradenitis suppurativa Axillae, neck  Chronic, well controlled  Cont doxycycline 20mg  twice daily with food Cont spironolactone 100mg  as prescribed by PCP Cont clindamycin daily  Doxycycline should be taken with food to prevent nausea. Do not lay down for 30 minutes after taking. Be cautious with sun exposure and use good sun protection while on this medication. Pregnant women should not take this medication.    Other Related Medications clindamycin (CLINDAGEL) 1 % gel  Seborrheic Keratoses - Stuck-on, waxy, tan-brown papules and plaques  - Discussed benign etiology and prognosis. - Observe - Call for any changes  Return in about 3 months (around 04/02/2020).  Graciella Belton, RMA, am acting as scribe for Forest Gleason, MD .  Documentation: I have reviewed the above documentation for accuracy and completeness, and I agree with the above.  Forest Gleason, MD

## 2020-01-13 ENCOUNTER — Encounter: Payer: Self-pay | Admitting: Dermatology

## 2020-01-15 ENCOUNTER — Encounter: Payer: Self-pay | Admitting: Podiatry

## 2020-01-15 ENCOUNTER — Other Ambulatory Visit: Payer: Self-pay

## 2020-01-15 ENCOUNTER — Ambulatory Visit (INDEPENDENT_AMBULATORY_CARE_PROVIDER_SITE_OTHER): Payer: Medicare Other | Admitting: Podiatry

## 2020-01-15 DIAGNOSIS — E0842 Diabetes mellitus due to underlying condition with diabetic polyneuropathy: Secondary | ICD-10-CM

## 2020-01-15 DIAGNOSIS — M2040 Other hammer toe(s) (acquired), unspecified foot: Secondary | ICD-10-CM

## 2020-01-15 NOTE — Progress Notes (Signed)
This patient presents to the office for her annual foot exam.  This patient requests diabetic shoes.  She is having no problems in her feet related to her diabetes.  Vascular  Dorsalis pedis and posterior tibial pulses are palpable  B/L.  Capillary return  WNL.  Temperature gradient is  WNL.  Skin turgor  WNL  Venous stasis both lower legs.  Sensorium  [employer] monofilament wire  absent.   Absent  tactile sensation.  Nail Exam  Patient has normal nails with no evidence of bacterial or fungal infection.  Orthopedic  Exam  Muscle tone and muscle strength  WNL.  No limitations of motion feet  B/L.  No crepitus or joint effusion noted.  Foot type is unremarkable and digits show no abnormalities.  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  Skin  No open lesions.  Normal skin texture and turgor.  Diabetic neuropathy  ROV  Diabetic foot exam performed and she has absent  LOPS.  Patient qualifies for diabetic shoes due to DPN and hallux malleus  B/L and hammer toes.   Gardiner Barefoot DPM

## 2020-02-11 ENCOUNTER — Other Ambulatory Visit: Payer: Self-pay | Admitting: Dermatology

## 2020-02-11 DIAGNOSIS — L219 Seborrheic dermatitis, unspecified: Secondary | ICD-10-CM

## 2020-02-17 DIAGNOSIS — M7541 Impingement syndrome of right shoulder: Secondary | ICD-10-CM | POA: Insufficient documentation

## 2020-02-20 ENCOUNTER — Ambulatory Visit: Payer: Medicare Other | Admitting: Orthotics

## 2020-02-25 ENCOUNTER — Other Ambulatory Visit: Payer: Self-pay

## 2020-02-25 ENCOUNTER — Other Ambulatory Visit: Payer: Medicare Other | Admitting: Orthotics

## 2020-03-02 ENCOUNTER — Other Ambulatory Visit: Payer: Self-pay | Admitting: Dermatology

## 2020-03-09 ENCOUNTER — Other Ambulatory Visit: Payer: Self-pay | Admitting: Dermatology

## 2020-03-09 DIAGNOSIS — L719 Rosacea, unspecified: Secondary | ICD-10-CM

## 2020-03-17 ENCOUNTER — Ambulatory Visit: Payer: Medicare Other | Admitting: Orthotics

## 2020-04-03 ENCOUNTER — Other Ambulatory Visit: Payer: Self-pay | Admitting: Dermatology

## 2020-04-08 ENCOUNTER — Ambulatory Visit: Payer: Medicare Other | Admitting: Dermatology

## 2020-04-12 ENCOUNTER — Inpatient Hospital Stay: Payer: Medicare Other | Attending: Oncology

## 2020-04-12 ENCOUNTER — Inpatient Hospital Stay: Payer: Medicare Other | Admitting: Oncology

## 2020-04-30 ENCOUNTER — Telehealth: Payer: Self-pay | Admitting: Podiatry

## 2020-04-30 NOTE — Telephone Encounter (Signed)
Pt left message stating she needs to check on status of diabetic shoes as they were ordered last yr.  I returned call and told pt we did not get the paperwork from the pcp until almost the end of January and that is what was holding them up. But they should be shipping soon and I went ahead and scheduled pt to pick them up since Rick's schedule is tight there in [place].

## 2020-05-03 ENCOUNTER — Telehealth: Payer: Self-pay

## 2020-05-03 DIAGNOSIS — L4050 Arthropathic psoriasis, unspecified: Secondary | ICD-10-CM

## 2020-05-03 NOTE — Telephone Encounter (Signed)
Patient called asking for a RF of her Xyzal. Okay to RF?  Also patient does not like [facility]. She would like a referral to [facility]. Is this something they could even help her with?

## 2020-05-03 NOTE — Telephone Encounter (Signed)
Ok to refill Xyzal.  [facility] wouldn't work. It needs to be a rheumatologist. She could go to [facility] (in [place]) or another [facility] rheumatology practice.   Thank you!

## 2020-05-05 MED ORDER — LEVOCETIRIZINE DIHYDROCHLORIDE 5 MG PO TABS: 5.0000 mg | ORAL_TABLET | Freq: Every evening | ORAL | 0 refills | Status: DC

## 2020-05-05 NOTE — Telephone Encounter (Signed)
RX RF sent in. Patient does not want to travel to [place]. She would like referral to [facility]. Placing referral and faxing information to their office.

## 2020-06-02 ENCOUNTER — Other Ambulatory Visit: Payer: Medicare Other

## 2020-06-02 NOTE — Telephone Encounter (Signed)
[facility] today and patient has cancelled 3 new patient appointments with their office. Referral will be cancelled in our workque.

## 2020-06-30 ENCOUNTER — Other Ambulatory Visit: Payer: Medicare Other

## 2020-06-30 ENCOUNTER — Other Ambulatory Visit: Payer: Self-pay

## 2020-06-30 DIAGNOSIS — E1140 Type 2 diabetes mellitus with diabetic neuropathy, unspecified: Secondary | ICD-10-CM

## 2020-06-30 DIAGNOSIS — M2041 Other hammer toe(s) (acquired), right foot: Secondary | ICD-10-CM | POA: Diagnosis not present

## 2020-06-30 DIAGNOSIS — M2042 Other hammer toe(s) (acquired), left foot: Secondary | ICD-10-CM

## 2020-06-30 DIAGNOSIS — M216X1 Other acquired deformities of right foot: Secondary | ICD-10-CM | POA: Diagnosis not present

## 2020-07-12 ENCOUNTER — Ambulatory Visit: Payer: Medicare Other | Admitting: Cardiology

## 2020-07-16 ENCOUNTER — Encounter: Payer: Self-pay | Admitting: Emergency Medicine

## 2020-07-16 ENCOUNTER — Emergency Department: Payer: Medicare Other

## 2020-07-16 ENCOUNTER — Other Ambulatory Visit: Payer: Self-pay

## 2020-07-16 ENCOUNTER — Emergency Department
Admission: EM | Admit: 2020-07-16 | Discharge: 2020-07-16 | Disposition: A | Discharge status: Home / Self Care | Payer: Medicare Other | Attending: Emergency Medicine | Admitting: Emergency Medicine

## 2020-07-16 DIAGNOSIS — E119 Type 2 diabetes mellitus without complications: Secondary | ICD-10-CM | POA: Diagnosis not present

## 2020-07-16 DIAGNOSIS — Z87891 Personal history of nicotine dependence: Secondary | ICD-10-CM | POA: Insufficient documentation

## 2020-07-16 DIAGNOSIS — T189XXA Foreign body of alimentary tract, part unspecified, initial encounter: Secondary | ICD-10-CM | POA: Diagnosis not present

## 2020-07-16 DIAGNOSIS — Z7951 Long term (current) use of inhaled steroids: Secondary | ICD-10-CM | POA: Insufficient documentation

## 2020-07-16 DIAGNOSIS — I110 Hypertensive heart disease with heart failure: Secondary | ICD-10-CM | POA: Diagnosis not present

## 2020-07-16 DIAGNOSIS — Z7982 Long term (current) use of aspirin: Secondary | ICD-10-CM | POA: Insufficient documentation

## 2020-07-16 DIAGNOSIS — X58XXXA Exposure to other specified factors, initial encounter: Secondary | ICD-10-CM | POA: Insufficient documentation

## 2020-07-16 DIAGNOSIS — E039 Hypothyroidism, unspecified: Secondary | ICD-10-CM | POA: Insufficient documentation

## 2020-07-16 DIAGNOSIS — Z794 Long term (current) use of insulin: Secondary | ICD-10-CM | POA: Diagnosis not present

## 2020-07-16 DIAGNOSIS — J441 Chronic obstructive pulmonary disease with (acute) exacerbation: Secondary | ICD-10-CM | POA: Diagnosis not present

## 2020-07-16 DIAGNOSIS — I5032 Chronic diastolic (congestive) heart failure: Secondary | ICD-10-CM | POA: Diagnosis not present

## 2020-07-16 DIAGNOSIS — Z79899 Other long term (current) drug therapy: Secondary | ICD-10-CM | POA: Diagnosis not present

## 2020-07-16 DIAGNOSIS — J45909 Unspecified asthma, uncomplicated: Secondary | ICD-10-CM | POA: Diagnosis not present

## 2020-07-16 LAB — CBG MONITORING, ED: Glucose-Capillary: 278 mg/dL — ABNORMAL HIGH (ref 70–99)

## 2020-07-16 MED ORDER — ALUM & MAG HYDROXIDE-SIMETH 200-200-20 MG/5ML PO SUSP
30.0000 mL | Freq: Once | ORAL | Status: AC
  Administered 2020-07-16: 30 mL via ORAL
  Filled 2020-07-16: qty 30

## 2020-07-16 MED ORDER — LIDOCAINE VISCOUS HCL 2 % MT SOLN
15.0000 mL | Freq: Once | OROMUCOSAL | Status: AC
  Administered 2020-07-16: 15 mL via ORAL
  Filled 2020-07-16: qty 15

## 2020-07-16 NOTE — ED Triage Notes (Signed)
Pt to ED from [facility]. Pt states that about 0200 she got ready to go to bed and she took all of her medications, pt states that her medications was all in a cup and she swallowed before she remembered that the needle for her insulin pen was in the cup as well. Pt states that the needle is still in the packaging that it came in. Pt states that she is not having any pain but she can tell that something is there. Pt states that she has not tried to drink anything since then, pt states that she was sneezing prior to arrival and could feel the object moving.

## 2020-07-16 NOTE — ED Triage Notes (Signed)
FIRST NURSE NOTE, pt sent from [facility] with c/o swallowing the needle to an insulin pen that was left in a cup last night. Pt denies any pain

## 2020-07-16 NOTE — ED Notes (Signed)
Pt refused revital, a/o x 4, GCS 15, resp e/u, no distress noted during discharge

## 2020-07-16 NOTE — ED Provider Notes (Signed)
[facility] Emergency Department Provider Note  ____________________________________________   Event Date/Time   First MD Initiated Contact with Patient 07/16/20 1753     (approximate)  I have reviewed the triage vital signs and the nursing notes.   HISTORY  Chief Complaint Foreign Body   HPI [name] [name] is a 65 y.o. female who reports to the ER for complaint that she swallowed her insulin needle at around 2AM. She reports the capped and packaged needle was in a cup with her other medications and she forgot to take it out and then accidentally swallowed it with her other medications.  She denies eating or drinking anything since the time of the incident.  She reports that she feels it is "stuck in her trachea".  She denies any shortness of breath, difficulty breathing.  She does report that she sneezed several times and "felt it move".  She denies any difficulty swallowing her secretions, again has not tried any other items p.o.       Past Medical History:  Diagnosis Date  . Allergic rhinitis   . Allergy   . Asthma   . Back pain   . CHF (congestive heart failure) ([place])   . COPD (chronic obstructive pulmonary disease) ([place])   . Degenerative joint disease of knee, left   . Depression   . Diabetes mellitus without complication ([place])   . Edema   . Elevated lipids   . GERD (gastroesophageal reflux disease)   . Hidradenitis   . History of colonic polyps   . Hypertension   . Hypothyroidism   . IBS (irritable bowel syndrome)   . Insomnia   . Obesity   . Oxygen dependent   . Pneumonia   . PVD (peripheral vascular disease) ([place])   . Sinusitis, chronic   . Sleep apnea   . Thyroid disease   . Vaginitis, atrophic   . Vertigo   . Vitamin D deficiency     Patient Active Problem List   Diagnosis Date Noted  . Rosacea 05/30/2019  . COPD (chronic obstructive pulmonary disease) ([place]) 08/25/2016  . Acute on chronic heart failure with normal ejection  fraction ([place]) 08/20/2016  . Chronic diastolic heart failure ([place]) 06/02/2016  . Bilateral lower leg cellulitis 05/23/2016  . Leukocytosis 05/23/2016  . Iron deficiency anemia 05/23/2016  . Thrombocytosis 05/23/2016  . COPD with acute exacerbation ([place]) 05/21/2016  . Pulmonary hypertension ([place]) 12/14/2015  . Pneumonia 09/08/2015  . Breast abscess 06/02/2015  . Abnormal mammogram of both breasts 02/25/2015  . Hidradenitis 12/30/2014  . Diabetes ([place]) 12/30/2014  . Essential hypertension 12/30/2014  . Asthma 12/30/2014    Past Surgical History:  Procedure Laterality Date  . ABDOMINAL HYSTERECTOMY    . AXILLARY HIDRADENITIS EXCISION    . BREAST EXCISIONAL BIOPSY Bilateral 97/5300   RUPTURED FOLLICULAR CYSTS WITH ABSCESSES AND SCARRING, CONSISTENT WITH HIDRADENITIS SUPPURATIVA.   [name] CESAREAN SECTION     x 2  . CHOLECYSTECTOMY    . HEEL SPUR EXCISION N/A   . HYDRADENITIS EXCISION Right 12/31/2015   Procedure: EXCISION HIDRADENITIS AXILLA;  Surgeon: Clayburn Pert, MD;  Location: [facility] ORS;  Service: General;  Laterality: Right;  . KNEE SURGERY Left    1998  . TONSILLECTOMY      Prior to Admission medications   Medication Sig Start Date End Date Taking? Authorizing Provider  albuterol (PROVENTIL HFA;VENTOLIN HFA) 108 (90 Base) MCG/ACT inhaler Inhale 2 puffs into the lungs every 6 (six) hours as needed  for wheezing or shortness of breath.    [provider]  albuterol (PROVENTIL) (2.5 MG/3ML) 0.083% nebulizer solution Take 2.5 mg by nebulization every 6 (six) hours as needed for wheezing or shortness of breath.     [provider]  ammonium lactate (AMLACTIN) 12 % cream Apply topically as needed for dry skin.    [provider]  aspirin EC 81 MG tablet Take 81 mg by mouth daily.    [provider]  atorvastatin (LIPITOR) 20 MG tablet Take 20 mg by mouth at bedtime.     [provider]  budesonide-formoterol (SYMBICORT) 160-4.5 MCG/ACT  inhaler Inhale 2 puffs into the lungs 2 (two) times daily.    [provider]  calcipotriene (DOVONOX) 0.005 % cream Apply topically 2 (two) times daily. 01/01/20   Moye, [place], MD  clindamycin (CLINDAGEL) 1 % gel Apply 1 application topically 2 (two) times daily. 04/05/17   Clayburn Pert, MD  clindamycin (CLINDAGEL) 1 % gel Apply topically in the morning and at bedtime. 09/11/19 09/10/20  Moye, [place], MD  clindamycin (CLINDAGEL) 1 % gel Apply topically daily. 11/13/19 11/12/20  Moye, [place], MD  clobetasol (TEMOVATE) 0.05 % external solution  03/21/18   [provider]  clobetasol (TEMOVATE) 0.05 % external solution APPLY TO AFFECTED AREA OF SCALP ONCE DAILY AS NEEDED FOR ITCH. AVOID FACE,GROIN,AXILLIA. 02/11/20   Moye, [place], MD  clobetasol (TEMOVATE) 0.05 % external solution Apply to affected areas at scalp daily as needed for itch. Avoid applying to face, groin, and axilla. Use as directed. Risk of skin atrophy with long-term use reviewed. 03/02/20   Moye, [place], MD  clobetasol (TEMOVATE) 0.05 % external solution APPLY TO AFFECTED AREA OF SCALP ONCE DAILY AS NEEDED FOR ITCH. AVOID FACE,GROIN, AXILLIA. 04/06/20   Moye, [place], MD  clobetasol ointment (TEMOVATE) 0.05 % Apply to affected areas at chest, arms and hands twice daily for 2 weeks then only on weekends as needed. Avoid applying to face, groin, and axilla. Use as directed. Risk of skin atrophy with long-term use reviewed. 11/13/19   Moye, [place], MD  clobetasol ointment (TEMOVATE) 0.05 % APPLY TWICE A DAY AS NEEDED TO RASH ON CHEST,ARMS, LEGS FOR UP TO 2 WEEKS AND THEN WEEKENDS ONLY. AVOID APPLYING TO FACE,GROIN AND AXILIA. 12/01/19   Moye, [place], MD  DALIRESP 500 MCG TABS tablet Take 500 mcg by mouth daily.  03/19/17   [provider]  doxepin (SINEQUAN) 50 MG capsule Take 50 mg by mouth at bedtime.    [provider]  doxycycline (PERIOSTAT) 20 MG tablet TAKE 1 TABLET BY MOUTH TWICE DAILY  WITH FOOD 03/09/20   Moye, [place], MD  Dulaglutide (TRULICITY) 2.48 GN/0.0BB SOPN Inject into the skin once a week.    [provider]  DULoxetine (CYMBALTA) 20 MG capsule Take 20 mg by mouth daily.    [provider]  fluocinonide (LIDEX) 0.05 % external solution Apply 1 application topically 2 (two) times daily.    [provider]  fluticasone Asencion Islam) 50 MCG/ACT nasal spray  06/24/18   [provider]  gabapentin (NEURONTIN) 300 MG capsule Take 300 mg by mouth 2 (two) times daily.  03/25/17   [provider]  glucose blood test strip TEST three times a day 12/30/15   [provider]  HUMULIN R U-500 KWIKPEN 500 UNIT/ML injection Inject 120 Units into the skin 2 (two) times daily with a meal.     [provider]  hydrocortisone 2.5 % cream  Apply after ketoconazole cream twice daily for 2 weeks to areas at lower abdomen then as needed for rash. 11/13/19   Moye, [place], MD  ibuprofen (ADVIL,MOTRIN) 800 MG tablet Take 1 tablet (800 mg total) by mouth every 8 (eight) hours as needed for mild pain or moderate pain (with food). 12/21/16   Eula Listen, MD  ketoconazole (NIZORAL) 2 % cream Apply twice daily for 2 weeks then as needed for rash to areas at lower abdomen. 11/13/19   Moye, [place], MD  ketoconazole (NIZORAL) 2 % shampoo LATHER ON SCALP THREE TIMES PER WEEK, LEAVE ON FOR 8-10 MINUTES THEN RINSE WELL 09/04/19   Moye, [place], MD  Lancets Misc. (ACCU-CHEK FASTCLIX LANCET) KIT  12/27/15   [provider]  levocetirizine (XYZAL) 5 MG tablet Take 1 tablet (5 mg total) by mouth every evening. 05/05/20   Moye, [place], MD  levothyroxine (SYNTHROID, LEVOTHROID) 200 MCG tablet Take 200 mcg by mouth daily before breakfast.     [provider]  LUTEIN PO Take 1 tablet by mouth daily.    [provider]  meclizine (ANTIVERT) 25 MG tablet Take 25 mg by mouth 3 (three) times daily as needed for dizziness.     [provider]  Melatonin 5 MG CAPS Take 2 capsules by mouth daily.    [provider]  meloxicam (MOBIC) 15 MG tablet Take 1 tablet (15 mg total) by mouth daily. 09/29/19   Cuthriell, Charline Bills, PA-C  mometasone (NASONEX) 50 MCG/ACT nasal spray Place 2 sprays into both nostrils daily.     [provider]  montelukast (SINGULAIR) 10 MG tablet Take 10 mg by mouth at bedtime.     [provider]  nystatin (MYCOSTATIN) 100000 UNIT/ML suspension  06/04/18   [provider]  olopatadine (PATANOL) 0.1 % ophthalmic solution Place 1 drop into both eyes 2 (two) times daily.    [provider]  omeprazole (PRILOSEC) 40 MG capsule Take 40 mg by mouth daily.    [provider]  oxyCODONE-acetaminophen (PERCOCET/ROXICET) 5-325 MG tablet Take 1 tablet by mouth every 6 (six) hours as needed for severe pain. 09/29/19   Cuthriell, Charline Bills, PA-C  potassium citrate (UROCIT-K) 10 MEQ (1080 MG) SR tablet Take 1 tablet (10 mEq total) by mouth every morning. 11/11/19   Alisa Graff, FNP  spironolactone (ALDACTONE) 50 MG tablet Take 100 mg by mouth daily.     [provider]  tacrolimus (PROTOPIC) 0.1 % ointment Apply topically 2 (two) times daily.    [provider]  tacrolimus (PROTOPIC) 0.1 % ointment Apply topically 2 (two) times daily. To legs 06/12/19   Moye, [place], MD  torsemide (DEMADEX) 20 MG tablet TAKE  1 TABLETS BY MOUTH daily 11/11/19   Darylene Price A, FNP  traZODone (DESYREL) 50 MG tablet Take 50-100 mg by mouth at bedtime as needed for sleep.     [provider]  triamcinolone cream (KENALOG) 0.1 % Apply 1 application topically 2 (two) times daily.    [provider]  TRULICITY 1.5 PJ/0.9TO SOPN Inject 1.5 mg into the skin once a week. 05/02/19   [provider]    Allergies Codeine  Family History  Problem Relation Age of Onset  . Rashes / Skin problems Father   . Hypertension Father   .  Heart disease Father   . Breast cancer Paternal Grandmother   . COPD Mother   . Kidney cancer Neg Hx   . Bladder Cancer Neg  Hx     Social History Social History   Tobacco Use  . Smoking status: Former Smoker    Packs/day: 3.00    Years: 20.00    Pack years: 60.00    Types: Cigarettes    Quit date: 12/18/1993    Years since quitting: 26.5  . Smokeless tobacco: Never Used  Vaping Use  . Vaping Use: Never used  Substance Use Topics  . Alcohol use: No  . Drug use: No    Review of Systems Constitutional: No fever/chills Eyes: No visual changes. ENT: No sore throat. Cardiovascular: Denies chest pain. Respiratory: Denies shortness of breath. Gastrointestinal: + Suspected swallowed foreign body, no abdominal pain.  No nausea, no vomiting.  No diarrhea.  No constipation. Genitourinary: Negative for dysuria. Musculoskeletal: Negative for back pain. Skin: Negative for rash. Neurological: Negative for headaches, focal weakness or numbness.  ____________________________________________   PHYSICAL EXAM:  VITAL SIGNS: ED Triage Vitals  Enc Vitals Group     BP 07/16/20 1425 (!) 122/58     Pulse Rate 07/16/20 1425 83     Resp 07/16/20 1425 16     Temp 07/16/20 1425 98.2 F (36.8 C)     Temp Source 07/16/20 1425 Oral     SpO2 07/16/20 1425 93 %     Weight 07/16/20 1426 260 lb (117.9 kg)     Height 07/16/20 1426 _0  (1.6 m)     Head Circumference --      Peak Flow --      Pain Score 07/16/20 1426 0     Pain Loc --      Pain Edu? --      Excl. in [place]? --    Constitutional: Alert and oriented. Well appearing and in no acute distress. Eyes: Conjunctivae are normal. PERRL. EOMI. Head: Atraumatic. Nose: No congestion/rhinnorhea. Mouth/Throat: Mucous membranes are moist.  Oropharynx non-erythematous.  Patient is managing her secretions well. Neck: No stridor.   Cardiovascular: Normal rate, regular rhythm. Grossly normal heart sounds.  Respiratory: Normal respiratory effort.   No retractions. Lungs CTAB. Gastrointestinal: Soft and nontender. No distention. No abdominal bruits. No CVA tenderness. Musculoskeletal: No lower extremity tenderness nor edema.  No joint effusions. Neurologic:  Normal speech and language. No gross focal neurologic deficits are appreciated.  Skin:  Skin is warm, dry and intact. No rash noted. Psychiatric: Mood and affect are normal. Speech and behavior are normal.  ____________________________________________  RADIOLOGY I, Marlana Salvage, personally viewed and evaluated these images (plain radiographs) as part of my medical decision making, as well as reviewing the written report by the radiologist.  ED provider interpretation: No acute foreign body able to be identified  Official radiology report(s): DG Neck Soft Tissue  Result Date: 07/16/2020 CLINICAL DATA:  Swallowed a needle EXAM: NECK SOFT TISSUES - 1+ VIEW; CHEST - 2 VIEW COMPARISON:  09/29/2019 FINDINGS: Soft tissue neck: Frontal and lateral views of the soft tissues of the neck are obtained. There are no radiopaque foreign bodies. Airways patent. Soft tissues are normal. Vascular calcifications are seen at the carotid bifurcations. No acute bony abnormalities. Two-view chest: Frontal and lateral views are obtained. Cardiac silhouette is unremarkable. No airspace disease, effusion, or pneumothorax. There are no acute bony abnormalities. There are no radiopaque foreign bodies along the alimentary tract from the base of the neck through the upper abdomen. Cholecystectomy clips are visualized on the lateral view. IMPRESSION: 1. No evidence of ingested radiopaque foreign body on this exam, covering the nasopharynx  through the upper abdomen. Electronically Signed   By: Randa Ngo M.D.   On: 07/16/2020 18:47   DG Chest 2 View  Result Date: 07/16/2020 CLINICAL DATA:  Swallowed a needle EXAM: NECK SOFT TISSUES - 1+ VIEW; CHEST - 2 VIEW COMPARISON:  09/29/2019 FINDINGS: Soft tissue neck:  Frontal and lateral views of the soft tissues of the neck are obtained. There are no radiopaque foreign bodies. Airways patent. Soft tissues are normal. Vascular calcifications are seen at the carotid bifurcations. No acute bony abnormalities. Two-view chest: Frontal and lateral views are obtained. Cardiac silhouette is unremarkable. No airspace disease, effusion, or pneumothorax. There are no acute bony abnormalities. There are no radiopaque foreign bodies along the alimentary tract from the base of the neck through the upper abdomen. Cholecystectomy clips are visualized on the lateral view. IMPRESSION: 1. No evidence of ingested radiopaque foreign body on this exam, covering the nasopharynx through the upper abdomen. Electronically Signed   By: Randa Ngo M.D.   On: 07/16/2020 18:47   ____________________________________________   INITIAL IMPRESSION / ASSESSMENT AND PLAN / ED COURSE  As part of my medical decision making, I reviewed the following data within the [employer] notes reviewed and incorporated, Radiograph reviewed and Notes from prior ED visits        Patient is a 65 year old female who presents to the emergency department for evaluation of swallowed foreign body at 2 AM this morning.  See HPI for further details.  In triage, patient has normal vital signs.  On physical exam, she has no difficulty managing her secretions, no abdominal tenderness present.  X-ray of the soft tissues of the neck as well as the chest were obtained and no evidence of retained foreign body in the soft tissues of the neck where the patient reports her remaining globus sensation.  Will attempt treatment with a GI cocktail as well as a p.o. challenge.  Patient will be signed out to attending provider Dr. Kerman Passey pending her tolerating p.o.  She is able to tolerate p.o. without significant difficulty, current plan is to discharge home with close PCP follow-up.       ____________________________________________   FINAL CLINICAL IMPRESSION(S) / ED DIAGNOSES  Final diagnoses:  Swallowed foreign body, initial encounter     ED Discharge Orders    None      *Please note:  FARZANA KOCI was evaluated in Emergency Department on 07/16/2020 for the symptoms described in the history of present illness. She was evaluated in the context of the global COVID-19 pandemic, which necessitated consideration that the patient might be at risk for infection with the SARS-CoV-2 virus that causes COVID-19. Institutional protocols and algorithms that pertain to the evaluation of patients at risk for COVID-19 are in a state of rapid change based on information released by regulatory bodies including the CDC and federal and state organizations. These policies and algorithms were followed during the patient's care in the ED.  Some ED evaluations and interventions may be delayed as a result of limited staffing during and the pandemic.*   Note:  This document was prepared using Dragon voice recognition software and may include unintentional dictation errors.   Marlana Salvage, PA 07/16/20 1942    Harvest Dark, MD 07/16/20 2325

## 2020-07-16 NOTE — ED Provider Notes (Signed)
-----------------------------------------   9:10 PM on 07/16/2020 -----------------------------------------  Patient care assumed from physician assistant Evelina Bucy.  Overall patient appears very well.  X-rays are negative for foreign body.  Patient states she is sure she swallowed the needle which she states is a short needle with a plastic cap over the needle.  Given the needle was The highly anticipate that the patient will pass this without issue.  Patient has been able to tolerate p.o. trial without issue in the emergency department.  Denies any abdominal pain.  We will discharge the patient home with PCP follow-up.  I discussed return precautions for any abdominal pain nausea vomiting or development of fever.   Harvest Dark, MD 07/16/20 2111

## 2020-07-20 ENCOUNTER — Other Ambulatory Visit: Payer: Self-pay

## 2020-07-20 ENCOUNTER — Encounter: Payer: Self-pay | Admitting: Ophthalmology

## 2020-07-22 NOTE — Discharge Instructions (Signed)

## 2020-07-27 ENCOUNTER — Encounter
Admission: RE | Disposition: A | Discharge status: Home / Self Care | Payer: Self-pay | Source: Ambulatory Visit | Attending: Ophthalmology

## 2020-07-27 ENCOUNTER — Other Ambulatory Visit: Payer: Self-pay

## 2020-07-27 ENCOUNTER — Ambulatory Visit
Admission: RE | Admit: 2020-07-27 | Discharge: 2020-07-27 | Disposition: A | Discharge status: Home / Self Care | Payer: Medicare Other | Source: Ambulatory Visit | Attending: Ophthalmology | Admitting: Ophthalmology

## 2020-07-27 ENCOUNTER — Ambulatory Visit: Payer: Medicare Other | Admitting: Anesthesiology

## 2020-07-27 ENCOUNTER — Encounter: Payer: Self-pay | Admitting: Ophthalmology

## 2020-07-27 DIAGNOSIS — Z7982 Long term (current) use of aspirin: Secondary | ICD-10-CM | POA: Insufficient documentation

## 2020-07-27 DIAGNOSIS — Z79890 Hormone replacement therapy (postmenopausal): Secondary | ICD-10-CM | POA: Diagnosis not present

## 2020-07-27 DIAGNOSIS — H2511 Age-related nuclear cataract, right eye: Secondary | ICD-10-CM | POA: Insufficient documentation

## 2020-07-27 DIAGNOSIS — Z794 Long term (current) use of insulin: Secondary | ICD-10-CM | POA: Diagnosis not present

## 2020-07-27 DIAGNOSIS — Z79899 Other long term (current) drug therapy: Secondary | ICD-10-CM | POA: Insufficient documentation

## 2020-07-27 DIAGNOSIS — E1136 Type 2 diabetes mellitus with diabetic cataract: Secondary | ICD-10-CM | POA: Insufficient documentation

## 2020-07-27 DIAGNOSIS — Z885 Allergy status to narcotic agent status: Secondary | ICD-10-CM | POA: Insufficient documentation

## 2020-07-27 DIAGNOSIS — Z791 Long term (current) use of non-steroidal anti-inflammatories (NSAID): Secondary | ICD-10-CM | POA: Diagnosis not present

## 2020-07-27 DIAGNOSIS — Z87891 Personal history of nicotine dependence: Secondary | ICD-10-CM | POA: Diagnosis not present

## 2020-07-27 DIAGNOSIS — Z7951 Long term (current) use of inhaled steroids: Secondary | ICD-10-CM | POA: Insufficient documentation

## 2020-07-27 HISTORY — DX: Other specified postprocedural states: Z98.890

## 2020-07-27 HISTORY — PX: CATARACT EXTRACTION W/PHACO: SHX586

## 2020-07-27 HISTORY — DX: Nausea with vomiting, unspecified: R11.2

## 2020-07-27 HISTORY — DX: Polyneuropathy, unspecified: G62.9

## 2020-07-27 LAB — GLUCOSE, CAPILLARY
Glucose-Capillary: 78 mg/dL (ref 70–99)
Glucose-Capillary: 99 mg/dL (ref 70–99)

## 2020-07-27 SURGERY — PHACOEMULSIFICATION, CATARACT, WITH IOL INSERTION
Anesthesia: Monitor Anesthesia Care | Site: Eye | Laterality: Right

## 2020-07-27 MED ORDER — NA CHONDROIT SULF-NA HYALURON 40-17 MG/ML IO SOLN
INTRAOCULAR | Status: DC | PRN
  Administered 2020-07-27: 1 mL via INTRAOCULAR

## 2020-07-27 MED ORDER — CEFUROXIME OPHTHALMIC INJECTION 1 MG/0.1 ML
INJECTION | OPHTHALMIC | Status: DC | PRN
  Administered 2020-07-27: 0.1 mL via INTRACAMERAL

## 2020-07-27 MED ORDER — EPINEPHRINE PF 1 MG/ML IJ SOLN
INTRAOCULAR | Status: DC | PRN
  Administered 2020-07-27: 66 mL via OPHTHALMIC

## 2020-07-27 MED ORDER — FENTANYL CITRATE (PF) 100 MCG/2ML IJ SOLN
INTRAMUSCULAR | Status: DC | PRN
  Administered 2020-07-27 (×2): 50 ug via INTRAVENOUS

## 2020-07-27 MED ORDER — LIDOCAINE HCL (PF) 2 % IJ SOLN
INTRAOCULAR | Status: DC | PRN
  Administered 2020-07-27: 1 mL

## 2020-07-27 MED ORDER — TETRACAINE HCL 0.5 % OP SOLN
1.0000 [drp] | OPHTHALMIC | Status: DC | PRN
  Administered 2020-07-27 (×3): 1 [drp] via OPHTHALMIC

## 2020-07-27 MED ORDER — LACTATED RINGERS IV SOLN: INTRAVENOUS | Status: DC

## 2020-07-27 MED ORDER — ARMC OPHTHALMIC DILATING DROPS
1.0000 "application " | OPHTHALMIC | Status: DC | PRN
  Administered 2020-07-27 (×3): 1 via OPHTHALMIC

## 2020-07-27 MED ORDER — BRIMONIDINE TARTRATE-TIMOLOL 0.2-0.5 % OP SOLN
OPHTHALMIC | Status: DC | PRN
  Administered 2020-07-27: 1 [drp] via OPHTHALMIC

## 2020-07-27 MED ORDER — MIDAZOLAM HCL 2 MG/2ML IJ SOLN
INTRAMUSCULAR | Status: DC | PRN
  Administered 2020-07-27 (×2): 1 mg via INTRAVENOUS

## 2020-07-27 SURGICAL SUPPLY — 16 items
CANNULA ANT/CHMB 27GA (MISCELLANEOUS) ×4 IMPLANT
GLOVE SURG LX 8.0 MICRO (GLOVE) ×1
GLOVE SURG LX STRL 8.0 MICRO (GLOVE) ×1 IMPLANT
GLOVE SURG TRIUMPH 8.0 PF LTX (GLOVE) ×2 IMPLANT
GOWN STRL REUS W/ TWL LRG LVL3 (GOWN DISPOSABLE) ×2 IMPLANT
GOWN STRL REUS W/TWL LRG LVL3 (GOWN DISPOSABLE) ×4
LENS IOL TECNIS EYHANCE 22.0 (Intraocular Lens) ×2 IMPLANT
MARKER SKIN DUAL TIP RULER LAB (MISCELLANEOUS) ×2 IMPLANT
NEEDLE FILTER BLUNT 18X 1/2SAF (NEEDLE) ×1
NEEDLE FILTER BLUNT 18X1 1/2 (NEEDLE) ×1 IMPLANT
PACK EYE AFTER SURG (MISCELLANEOUS) ×2 IMPLANT
PACK OPTHALMIC (MISCELLANEOUS) ×2 IMPLANT
PACK PORFILIO (MISCELLANEOUS) ×2 IMPLANT
SYR 3ML LL SCALE MARK (SYRINGE) ×2 IMPLANT
SYR TB 1ML LUER SLIP (SYRINGE) ×2 IMPLANT
WIPE NON LINTING 3.25X3.25 (MISCELLANEOUS) ×2 IMPLANT

## 2020-07-27 NOTE — H&P (Signed)
[facility]   Primary Care Physician:  Denton Lank, MD Ophthalmologist: Dr. George Ina   Pre-Procedure History & Physical: HPI:  [name] [name] is a 65 y.o. female here for cataract surgery.   Past Medical History:  Diagnosis Date  . Allergic rhinitis   . Allergy   . Asthma   . Back pain   . CHF (congestive heart failure) ([place])   . COPD (chronic obstructive pulmonary disease) ([place])   . Degenerative joint disease of knee, left   . Depression   . Diabetes mellitus without complication ([place])   . Edema   . Elevated lipids   . GERD (gastroesophageal reflux disease)   . Hidradenitis   . History of colonic polyps   . Hypertension   . Hypothyroidism   . IBS (irritable bowel syndrome)   . Insomnia   . Neuropathy    feet and legs  . Obesity   . Oxygen dependent   . Pneumonia   . PONV (postoperative nausea and vomiting)   . PVD (peripheral vascular disease) ([place])   . Sinusitis, chronic   . Sleep apnea   . Thyroid disease   . Vaginitis, atrophic   . Vertigo   . Vitamin D deficiency     Past Surgical History:  Procedure Laterality Date  . ABDOMINAL HYSTERECTOMY    . AXILLARY HIDRADENITIS EXCISION    . BREAST EXCISIONAL BIOPSY Bilateral 64/4034   RUPTURED FOLLICULAR CYSTS WITH ABSCESSES AND SCARRING, CONSISTENT WITH HIDRADENITIS SUPPURATIVA.   [name] CESAREAN SECTION     x 2  . CHOLECYSTECTOMY    . HEEL SPUR EXCISION N/A   . HYDRADENITIS EXCISION Right 12/31/2015   Procedure: EXCISION HIDRADENITIS AXILLA;  Surgeon: Clayburn Pert, MD;  Location: [facility] ORS;  Service: General;  Laterality: Right;  . KNEE SURGERY Left    1998  . TONSILLECTOMY      Prior to Admission medications   Medication Sig Start Date End Date Taking? Authorizing Provider  acetaminophen (TYLENOL) 500 MG tablet Take 500 mg by mouth every 6 (six) hours as needed.   Yes [provider]  albuterol (PROVENTIL HFA;VENTOLIN HFA) 108 (90 Base) MCG/ACT inhaler Inhale 2 puffs into the lungs every 6  (six) hours as needed for wheezing or shortness of breath.   Yes [provider]  albuterol (PROVENTIL) (2.5 MG/3ML) 0.083% nebulizer solution Take 2.5 mg by nebulization every 6 (six) hours as needed for wheezing or shortness of breath.    Yes [provider]  ammonium lactate (AMLACTIN) 12 % cream Apply topically as needed for dry skin.   Yes [provider]  aspirin EC 81 MG tablet Take 81 mg by mouth daily.   Yes [provider]  atorvastatin (LIPITOR) 20 MG tablet Take 20 mg by mouth at bedtime.    Yes [provider]  budesonide-formoterol (SYMBICORT) 160-4.5 MCG/ACT inhaler Inhale 2 puffs into the lungs 2 (two) times daily.   Yes [provider]  clindamycin (CLINDAGEL) 1 % gel Apply topically in the morning and at bedtime. 09/11/19 09/10/20 Yes Moye, [place], MD  clobetasol (TEMOVATE) 0.05 % external solution Apply to affected areas at scalp daily as needed for itch. Avoid applying to face, groin, and axilla. Use as directed. Risk of skin atrophy with long-term use reviewed. 03/02/20  Yes Moye, [place], MD  DALIRESP 500 MCG TABS tablet Take 500 mcg by mouth daily.  03/19/17  Yes [provider]  doxepin (SINEQUAN) 50 MG capsule Take 50 mg by mouth  at bedtime.   Yes [provider]  doxycycline (PERIOSTAT) 20 MG tablet TAKE 1 TABLET BY MOUTH TWICE DAILY WITH FOOD 03/09/20  Yes Moye, [place], MD  DULoxetine (CYMBALTA) 20 MG capsule Take 20 mg by mouth daily.   Yes [provider]  empagliflozin (JARDIANCE) 10 MG TABS tablet Take 10 mg by mouth daily.   Yes [provider]  fluticasone Asencion Islam) 50 MCG/ACT nasal spray  06/24/18  Yes [provider]  gabapentin (NEURONTIN) 300 MG capsule Take 300 mg by mouth 2 (two) times daily.  03/25/17  Yes [provider]  HUMULIN R U-500 KWIKPEN 500 UNIT/ML injection Inject 115 Units into the skin 2 (two) times daily with a meal.   Yes [provider]  hydrocortisone 2.5 % cream Apply after ketoconazole cream twice daily for 2 weeks to areas at lower abdomen then as needed for rash. 11/13/19  Yes Moye, [place], MD  ibuprofen (ADVIL,MOTRIN) 800 MG tablet Take 1 tablet (800 mg total) by mouth every 8 (eight) hours as needed for mild pain or moderate pain (with food). 12/21/16  Yes Eula Listen, MD  ketoconazole (NIZORAL) 2 % cream Apply twice daily for 2 weeks then as needed for rash to areas at lower abdomen. 11/13/19  Yes Moye, [place], MD  ketoconazole (NIZORAL) 2 % shampoo LATHER ON SCALP THREE TIMES PER WEEK, LEAVE ON FOR 8-10 MINUTES THEN RINSE WELL 09/04/19  Yes Moye, [place], MD  levocetirizine (XYZAL) 5 MG tablet Take 1 tablet (5 mg total) by mouth every evening. 05/05/20  Yes Moye, [place], MD  levothyroxine (SYNTHROID, LEVOTHROID) 200 MCG tablet Take 200 mcg by mouth daily before breakfast.    Yes [provider]  LUTEIN PO Take 1 tablet by mouth daily.   Yes [provider]  meclizine (ANTIVERT) 25 MG tablet Take 25 mg by mouth 3 (three) times daily as needed for dizziness.   Yes [provider]  Melatonin 5 MG CAPS Take 2 capsules by mouth daily.   Yes [provider]  meloxicam (MOBIC) 15 MG tablet Take 1 tablet (15 mg total) by mouth daily. 09/29/19  Yes Cuthriell, Charline Bills, PA-C  mometasone (NASONEX) 50 MCG/ACT nasal spray Place 2 sprays into both nostrils daily.    Yes [provider]  montelukast (SINGULAIR) 10 MG tablet Take 10 mg by mouth at bedtime.    Yes [provider]  nystatin (MYCOSTATIN) 100000 UNIT/ML suspension  06/04/18  Yes [provider]  olopatadine (PATANOL) 0.1 % ophthalmic solution Place 1 drop into both eyes 2 (two) times daily.   Yes [provider]  omeprazole (PRILOSEC) 40 MG capsule Take 40 mg by mouth daily.   Yes [provider]  oxyCODONE-acetaminophen (PERCOCET/ROXICET) 5-325 MG tablet Take 1 tablet  by mouth every 6 (six) hours as needed for severe pain. 09/29/19  Yes Cuthriell, Charline Bills, PA-C  OXYGEN Inhale into the lungs. With CPAP   Yes [provider]  potassium citrate (UROCIT-K) 10 MEQ (1080 MG) SR tablet Take 1 tablet (10 mEq total) by mouth every morning. 11/11/19  Yes Darylene Price A, FNP  spironolactone (ALDACTONE) 50 MG tablet Take 100 mg by mouth daily.    Yes [provider]  tacrolimus (PROTOPIC) 0.1 % ointment Apply topically 2 (two) times daily. To legs 06/12/19  Yes Moye, [place], MD  torsemide (DEMADEX) 20 MG tablet TAKE  1 TABLETS BY MOUTH daily 11/11/19  Yes Darylene Price A, FNP  traZODone (DESYREL) 50 MG  tablet Take 50-100 mg by mouth at bedtime as needed for sleep.    Yes [provider]  TRULICITY 1.5 VQ/2.5ZD SOPN Inject 1.5 mg into the skin once a week. 05/02/19  Yes [provider]  clindamycin (CLINDAGEL) 1 % gel Apply topically daily. 11/13/19 11/12/20  Moye, [place], MD  clobetasol ointment (TEMOVATE) 0.05 % Apply to affected areas at chest, arms and hands twice daily for 2 weeks then only on weekends as needed. Avoid applying to face, groin, and axilla. Use as directed. Risk of skin atrophy with long-term use reviewed. 11/13/19   Moye, [place], MD  fluocinonide (LIDEX) 0.05 % external solution Apply 1 application topically 2 (two) times daily.    [provider]  glucose blood test strip TEST three times a day 12/30/15   [provider]  Lancets Misc. ([place]) KIT  12/27/15   [provider]  triamcinolone cream (KENALOG) 0.1 % Apply 1 application topically 2 (two) times daily.    [provider]    Allergies as of 06/30/2020 - Review Complete 01/15/2020  Allergen Reaction Noted  . Codeine Itching 09/15/2014    Family History  Problem Relation Age of Onset  . Rashes / Skin problems Father   . Hypertension Father   . Heart disease Father   . Breast cancer Paternal  Grandmother   . COPD Mother   . Kidney cancer Neg Hx   . Bladder Cancer Neg Hx     Social History   Socioeconomic History  . Marital status: Divorced    Spouse name: Not on file  . Number of children: Not on file  . Years of education: Not on file  . Highest education level: Not on file  Occupational History  . Occupation: disabled  Tobacco Use  . Smoking status: Former Smoker    Packs/day: 3.00    Years: 20.00    Pack years: 60.00    Types: Cigarettes    Quit date: 12/18/1993    Years since quitting: 26.6  . Smokeless tobacco: Never Used  Vaping Use  . Vaping Use: Never used  Substance and Sexual Activity  . Alcohol use: No  . Drug use: No  . Sexual activity: Never    Birth control/protection: Abstinence  Other Topics Concern  . Not on file  Social History Narrative  . Not on file   Social Determinants of Health   Financial Resource Strain: Not on file  Food Insecurity: Not on file  Transportation Needs: Not on file  Physical Activity: Not on file  Stress: Not on file  Social Connections: Not on file  Intimate Partner Violence: Not on file    Review of Systems: See HPI, otherwise negative ROS  Physical Exam: BP (!) 120/57   Pulse 81   Temp 97.9 F (36.6 C) (Temporal)   Resp 18   Ht 5' 3"  (1.6 m)   Wt 116.6 kg   SpO2 95%   BMI 45.53 kg/m  General:   Alert,  pleasant and cooperative in NAD Head:  Normocephalic and atraumatic. Respiratory:  Normal work of breathing. Cardiovascular:  RRR  Impression/Plan: [name] [name] is here for cataract surgery.  Risks, benefits, limitations, and alternatives regarding cataract surgery have been reviewed with the patient.  Questions have been answered.  All parties agreeable.   Birder Robson, MD  07/27/2020, 12:00 PM

## 2020-07-27 NOTE — Anesthesia Procedure Notes (Signed)
Procedure Name: MAC Date/Time: 07/27/2020 12:06 PM Performed by: Cameron Ali, CRNA Pre-anesthesia Checklist: Patient identified, Emergency Drugs available, Suction available, Timeout performed and Patient being monitored Patient Re-evaluated:Patient Re-evaluated prior to induction Oxygen Delivery Method: Nasal cannula Placement Confirmation: positive ETCO2

## 2020-07-27 NOTE — Anesthesia Postprocedure Evaluation (Signed)
Anesthesia Post Note  Patient: [name] [name]  Procedure(s) Performed: CATARACT EXTRACTION PHACO AND INTRAOCULAR LENS PLACEMENT (IOC) RIGHT DIABETIC 17.13 01:23.9 (Right Eye)     Patient location during evaluation: PACU Anesthesia Type: MAC Level of consciousness: awake and alert Pain management: pain level controlled Vital Signs Assessment: post-procedure vital signs reviewed and stable Respiratory status: spontaneous breathing, nonlabored ventilation, respiratory function stable and patient connected to nasal cannula oxygen Cardiovascular status: stable and blood pressure returned to baseline Postop Assessment: no apparent nausea or vomiting Anesthetic complications: no   No complications documented.  Luwanna Brossman A  Alferd Obryant

## 2020-07-27 NOTE — Anesthesia Preprocedure Evaluation (Addendum)
Anesthesia Evaluation  Patient identified by MRN, date of birth, ID band Patient awake    Reviewed: Allergy & Precautions, NPO status , Patient's Chart, lab work & pertinent test results, reviewed documented beta blocker date and time   History of Anesthesia Complications (+) PONV and history of anesthetic complications  Airway Mallampati: III  TM Distance: >3 FB Neck ROM: Full    Dental   Pulmonary asthma , sleep apnea , COPD,  oxygen dependent, former smoker,    breath sounds clear to auscultation       Cardiovascular hypertension, (-) angina+ Peripheral Vascular Disease, +CHF and + DOE (Chronic, stable)   Rhythm:Regular Rate:Normal   HLD   Neuro/Psych PSYCHIATRIC DISORDERS Depression  Peripheral neruopathy    GI/Hepatic GERD  , IBS   Endo/Other  diabetesHypothyroidism   Renal/GU      Musculoskeletal  (+) Arthritis ,   Abdominal (+) + obese (BMI 48),   Peds  Hematology  (+) anemia ,   Anesthesia Other Findings   Reproductive/Obstetrics                            Anesthesia Physical Anesthesia Plan  ASA: III  Anesthesia Plan: MAC   Post-op Pain Management:    Induction: Intravenous  PONV Risk Score and Plan: 3 and TIVA, Midazolam and Treatment may vary due to age or medical condition  Airway Management Planned: Nasal Cannula  Additional Equipment:   Intra-op Plan:   Post-operative Plan:   Informed Consent: I have reviewed the patients History and Physical, chart, labs and discussed the procedure including the risks, benefits and alternatives for the proposed anesthesia with the patient or authorized representative who has indicated his/her understanding and acceptance.       Plan Discussed with: CRNA and Anesthesiologist  Anesthesia Plan Comments:         Anesthesia Quick Evaluation

## 2020-07-27 NOTE — Op Note (Signed)
PREOPERATIVE DIAGNOSIS:  Nuclear sclerotic cataract of the right eye.   POSTOPERATIVE DIAGNOSIS:  H25.11 Cataract   OPERATIVE PROCEDURE:@   SURGEON:  Birder Robson, MD.   ANESTHESIA:  Anesthesiologist: Heniser, Fredric Dine, MD CRNA: Cameron Ali, CRNA  1.      Managed anesthesia care. 2.      0.41ml of Shugarcaine was instilled in the eye following the paracentesis.   COMPLICATIONS:  None.   TECHNIQUE:   Stop and chop   DESCRIPTION OF PROCEDURE:  The patient was examined and consented in the preoperative holding area where the aforementioned topical anesthesia was applied to the right eye and then brought back to the Operating Room where the right eye was prepped and draped in the usual sterile ophthalmic fashion and a lid speculum was placed. A paracentesis was created with the side port blade and the anterior chamber was filled with viscoelastic. A near clear corneal incision was performed with the steel keratome. A continuous curvilinear capsulorrhexis was performed with a cystotome followed by the capsulorrhexis forceps. Hydrodissection and hydrodelineation were carried out with BSS on a blunt cannula. The lens was removed in a stop and chop  technique and the remaining cortical material was removed with the irrigation-aspiration handpiece. The capsular bag was inflated with viscoelastic and the Technis [id number]  lens was placed in the capsular bag without complication. The remaining viscoelastic was removed from the eye with the irrigation-aspiration handpiece. The wounds were hydrated. The anterior chamber was flushed with BSS and the eye was inflated to physiologic pressure. 0.64ml of Vigamox was placed in the anterior chamber. The wounds were found to be water tight. The eye was dressed with Combigan. The patient was given protective glasses to wear throughout the day and a shield with which to sleep tonight. The patient was also given drops with which to begin a drop regimen today and will  follow-up with me in one day. Implant Name Type Inv. Item Serial No. Manufacturer Lot No. LRB No. Used Action  LENS IOL TECNIS EYHANCE 22.0 - [id number] Intraocular Lens LENS IOL TECNIS EYHANCE 22.0 [id number] JOHNSON   Right 1 Implanted   Procedure(s) with comments: CATARACT EXTRACTION PHACO AND INTRAOCULAR LENS PLACEMENT (IOC) RIGHT DIABETIC 17.13 01:23.9 (Right) - Diabetic - insulin  sleep apnea wants to be last  Electronically signed: Birder Robson 07/27/2020 12:23 PM

## 2020-07-27 NOTE — Transfer of Care (Signed)
Immediate Anesthesia Transfer of Care Note  Patient: [name] [name]  Procedure(s) Performed: CATARACT EXTRACTION PHACO AND INTRAOCULAR LENS PLACEMENT (IOC) RIGHT DIABETIC 17.13 01:23.9 (Right Eye)  Patient Location: PACU  Anesthesia Type: MAC  Level of Consciousness: awake, alert  and patient cooperative  Airway and Oxygen Therapy: Patient Spontanous Breathing and Patient connected to supplemental oxygen  Post-op Assessment: Post-op Vital signs reviewed, Patient's Cardiovascular Status Stable, Respiratory Function Stable, Patent Airway and No signs of Nausea or vomiting  Post-op Vital Signs: Reviewed and stable  Complications: No complications documented.

## 2020-07-28 ENCOUNTER — Encounter: Payer: Self-pay | Admitting: Ophthalmology

## 2020-08-03 ENCOUNTER — Other Ambulatory Visit: Payer: Self-pay | Admitting: Dermatology

## 2020-08-03 DIAGNOSIS — L719 Rosacea, unspecified: Secondary | ICD-10-CM

## 2020-08-09 ENCOUNTER — Ambulatory Visit: Payer: Medicare Other | Admitting: Cardiology

## 2020-08-30 ENCOUNTER — Telehealth: Payer: Self-pay

## 2020-08-30 ENCOUNTER — Ambulatory Visit: Payer: Medicare Other | Admitting: Cardiology

## 2020-08-30 NOTE — Telephone Encounter (Signed)
Patient called needing an appt. She states she has a skin tear on leg from hitting it getting in the bed. I have advised patient Dr. Nehemiah Massed had a cancellation on Wednesday at 11:30 but patient declined appt and only wants to see Dr. Laurence Ferrari. At this time Dr. Laurence Ferrari has not had any openings for this week. Patient was instructed she can call back tomorrow morning to see if an opening is available.   This patient called Dr. Robbi Garter pager for appointment. Advised to please only use number for emergency. Gave clear instructions and phone numbers for patient to call office.

## 2020-09-17 ENCOUNTER — Ambulatory Visit: Payer: Medicare Other | Admitting: Cardiology

## 2020-09-23 ENCOUNTER — Other Ambulatory Visit: Payer: Self-pay | Admitting: Family Medicine

## 2020-09-23 DIAGNOSIS — Z1231 Encounter for screening mammogram for malignant neoplasm of breast: Secondary | ICD-10-CM

## 2020-09-24 ENCOUNTER — Other Ambulatory Visit: Payer: Self-pay | Admitting: Dermatology

## 2020-10-05 ENCOUNTER — Ambulatory Visit
Admission: RE | Admit: 2020-10-05 | Discharge: 2020-10-05 | Disposition: A | Discharge status: Home / Self Care | Payer: Medicare Other | Source: Ambulatory Visit | Attending: Family Medicine | Admitting: Family Medicine

## 2020-10-05 ENCOUNTER — Other Ambulatory Visit: Payer: Self-pay

## 2020-10-05 DIAGNOSIS — Z1231 Encounter for screening mammogram for malignant neoplasm of breast: Secondary | ICD-10-CM | POA: Diagnosis present

## 2020-10-25 ENCOUNTER — Other Ambulatory Visit: Payer: Self-pay | Admitting: Dermatology

## 2020-10-28 ENCOUNTER — Ambulatory Visit: Payer: Medicare Other | Admitting: Dermatology

## 2020-11-04 ENCOUNTER — Other Ambulatory Visit: Payer: Self-pay | Admitting: Dermatology

## 2020-11-08 ENCOUNTER — Ambulatory Visit: Payer: Medicare Other | Admitting: Cardiology

## 2020-12-07 ENCOUNTER — Other Ambulatory Visit: Payer: Self-pay | Admitting: Family

## 2020-12-07 ENCOUNTER — Other Ambulatory Visit: Payer: Self-pay | Admitting: Dermatology

## 2020-12-07 DIAGNOSIS — I5032 Chronic diastolic (congestive) heart failure: Secondary | ICD-10-CM

## 2020-12-16 ENCOUNTER — Ambulatory Visit: Payer: Medicare Other | Admitting: Dermatology

## 2020-12-20 ENCOUNTER — Ambulatory Visit: Payer: Medicare Other | Admitting: Cardiology

## 2021-01-04 ENCOUNTER — Ambulatory Visit: Payer: Medicare Other | Admitting: Dermatology

## 2021-01-04 ENCOUNTER — Other Ambulatory Visit: Payer: Self-pay

## 2021-01-04 ENCOUNTER — Other Ambulatory Visit: Payer: Self-pay | Admitting: Dermatology

## 2021-01-04 DIAGNOSIS — L732 Hidradenitis suppurativa: Secondary | ICD-10-CM

## 2021-01-04 DIAGNOSIS — L304 Erythema intertrigo: Secondary | ICD-10-CM

## 2021-01-04 MED ORDER — KETOCONAZOLE 2 % EX SHAM: MEDICATED_SHAMPOO | CUTANEOUS | 11 refills | Status: DC

## 2021-01-06 ENCOUNTER — Encounter: Payer: Self-pay | Admitting: Dermatology

## 2021-01-06 ENCOUNTER — Other Ambulatory Visit: Payer: Self-pay

## 2021-01-06 ENCOUNTER — Ambulatory Visit (INDEPENDENT_AMBULATORY_CARE_PROVIDER_SITE_OTHER): Payer: Medicare Other | Admitting: Dermatology

## 2021-01-06 DIAGNOSIS — L732 Hidradenitis suppurativa: Secondary | ICD-10-CM

## 2021-01-06 DIAGNOSIS — L03114 Cellulitis of left upper limb: Secondary | ICD-10-CM

## 2021-01-06 DIAGNOSIS — L299 Pruritus, unspecified: Secondary | ICD-10-CM

## 2021-01-06 DIAGNOSIS — L219 Seborrheic dermatitis, unspecified: Secondary | ICD-10-CM

## 2021-01-06 DIAGNOSIS — L218 Other seborrheic dermatitis: Secondary | ICD-10-CM | POA: Diagnosis not present

## 2021-01-06 DIAGNOSIS — L0390 Cellulitis, unspecified: Secondary | ICD-10-CM

## 2021-01-06 DIAGNOSIS — L308 Other specified dermatitis: Secondary | ICD-10-CM

## 2021-01-06 MED ORDER — MUPIROCIN 2 % EX OINT: 1.0000 "application " | TOPICAL_OINTMENT | Freq: Every day | CUTANEOUS | 1 refills | Status: DC

## 2021-01-06 MED ORDER — DOXYCYCLINE MONOHYDRATE 100 MG PO CAPS: 100.0000 mg | ORAL_CAPSULE | Freq: Two times a day (BID) | ORAL | 0 refills | Status: DC

## 2021-01-06 MED ORDER — LEVOCETIRIZINE DIHYDROCHLORIDE 5 MG PO TABS: 5.0000 mg | ORAL_TABLET | Freq: Every evening | ORAL | 5 refills | Status: DC

## 2021-01-06 MED ORDER — HYDROCORTISONE 2.5 % EX CREA: TOPICAL_CREAM | Freq: Two times a day (BID) | CUTANEOUS | 1 refills | Status: DC | PRN

## 2021-01-06 NOTE — Progress Notes (Signed)
Follow-Up Visit   Subjective  [name] [name] is a 65 y.o. female who presents for the following: Skin Problem (Patient here today for a wound at left hand, injured about 3 weeks ago. Area is sore and still bleeds, patient advises it has had some yellow and green discharge. ).  Patient also advises that the skin at her face is very dry. She is currently using CeraVe cream  and applies after showering.   The following portions of the chart were reviewed this encounter and updated as appropriate:   Tobacco  Allergies  Meds  Problems  Med Hx  Surg Hx  Fam Hx      Review of Systems:  No other skin or systemic complaints except as noted in HPI or Assessment and Plan.  Objective  Well appearing patient in no apparent distress; mood and affect are within normal limits.  A focused examination was performed including face, axillae, abdomen, hand. Relevant physical exam findings are noted in the Assessment and Plan.  Head - Anterior (Face) Rough erythematous patches at face   Left Hand Open wound, small amount of serous drainage with erythematous  Scalp Minimal scale at scalp  bilateral axilla, inframammary Scarring, open comedones   Assessment & Plan  Other eczema Head - Anterior (Face)  Vs irritant dermatitis  Consider switching to VaniCream gentle cleanser and moisturizing cream.   Start hydrocortisone 2.5% cream twice for up to 2 weeks.   Recommended non-comedogenic (non-acne causing) facial oils include 100% argan oil or squalane. The can be used after applying any recommended creams or ointments to the skin in the evening. The Ord[employer] Brand has a high-quality and affordable version of both of these and can be found at [place].  Topical steroids (such as triamcinolone, fluocinolone, fluocinonide, mometasone, clobetasol, halobetasol, betamethasone, hydrocortisone) can cause thinning and lightening of the skin if they are used for too long in the same area.  Your physician has selected the right strength medicine for your problem and area affected on the body. Please use your medication only as directed by your physician to prevent side effects.    hydrocortisone 2.5 % cream - Head - Anterior (Face) Apply topically 2 (two) times daily as needed (Rash). Apply twice daily for up to 2 weeks to affected areas at face.  Cellulitis, unspecified cellulitis site Left Hand  Start mupirocin daily and cover with band-aid.   Start doxycycline monohydrate 100 mg twice daily with food x 7 days.   Doxycycline should be taken with food to prevent nausea. Do not lay down for 30 minutes after taking. Be cautious with sun exposure and use good sun protection while on this medication. Pregnant women should not take this medication.   Call if worsening or if not improving after 2 days   mupirocin ointment (BACTROBAN) 2 % - Left Hand Apply 1 application topically daily. With dressing changes  doxycycline (MONODOX) 100 MG capsule - Left Hand Take 1 capsule (100 mg total) by mouth 2 (two) times daily. Take with food  Related Procedures Anaerobic and Aerobic Culture  Seborrheic dermatitis Scalp  Chronic condition with duration over one year. Currently well-controlled.  Continue ketoconazole 2% shampoo apply three times per week, massage into scalp and leave in for 10 minutes before rinsing out   Pruritus Mid Back  Well controlled on Xyzal 5 mg at bedtime, continue.   levocetirizine (XYZAL) 5 MG tablet - Mid Back Take 1 tablet (5 mg total) by mouth every evening.  Hidradenitis suppurativa bilateral axilla, inframammary  Chronic condition with duration over one year. Currently well-controlled.  Continue spironolactone as prescribed by PCP  Continue clindamycin once daily  Continue hibiclens wash daily avoiding vagina, eyes, ears   Related Medications clindamycin (CLINDAGEL) 1 % gel APPLY TOPICALLY DAILY AS DIRECTED  Return in about 1 year  (around 01/06/2022).  Graciella Belton, RMA, am acting as scribe for Forest Gleason, MD .  Documentation: I have reviewed the above documentation for accuracy and completeness, and I agree with the above.  Forest Gleason, MD

## 2021-01-06 NOTE — Patient Instructions (Addendum)
Recommended non-comedogenic (non-acne causing) facial oils include 100% argan oil or squalane. The can be used after applying any recommended creams or ointments to the skin in the evening. The Ordinary Brand has a high-quality and affordable version of both of these and can be found at [place].  Consider switching to VaniCream gentle cleanser and moisturizing cream.   Start hydrocortisone 2.5% cream twice for up to 2 weeks to affected areas at face.   Start mupirocin daily to wound at hand and cover with band-aid.   Recommend Bactine or [employer] hypochlorous spray to clean.   Start doxycycline monohydrate 100 mg twice daily with food x 7 days.   Doxycycline should be taken with food to prevent nausea. Do not lay down for 30 minutes after taking. Be cautious with sun exposure and use good sun protection while on this medication. Pregnant women should not take this medication.   If you have any questions or concerns for your doctor, please call our main line at [phone] and press option 4 to reach your doctor's medical assistant. If no one answers, please leave a voicemail as directed and we will return your call as soon as possible. Messages left after 4 pm will be answered the following business day.   You may also send [place] a message via [place]. We typically respond to MyChart messages within 1-2 business days.  For prescription refills, please ask your pharmacy to contact our office. Our fax number is [phone].  If you have an urgent issue when the clinic is closed that cannot wait until the next business day, you can page your doctor at the number below.    Please note that while we do our best to be available for urgent issues outside of office hours, we are not available 24/7.   If you have an urgent issue and are unable to reach [place], you may choose to seek medical care at your doctor's office, retail clinic, urgent care center, or emergency room.  If you have a  medical emergency, please immediately call 911 or go to the emergency department.  Pager Numbers  - Dr. Nehemiah Massed: [phone]  - Dr. Laurence Ferrari: [phone]  - Dr. Nicole Kindred: [phone]  In the event of inclement weather, please call our main line at [phone] for an update on the status of any delays or closures.  Dermatology Medication Tips: Please keep the boxes that topical medications come in in order to help keep track of the instructions about where and how to use these. Pharmacies typically print the medication instructions only on the boxes and not directly on the medication tubes.   If your medication is too expensive, please contact our office at [phone] option 4 or send [place] a message through [place].   We are unable to tell what your co-pay for medications will be in advance as this is different depending on your insurance coverage. However, we may be able to find a substitute medication at lower cost or fill out paperwork to get insurance to cover a needed medication.   If a prior authorization is required to get your medication covered by your insurance company, please allow [place] 1-2 business days to complete this process.  Drug prices often vary depending on where the prescription is filled and some pharmacies may offer cheaper prices.  The website www.goodrx.com contains coupons for medications through different pharmacies. The prices here do not account for what the cost may be with help from insurance (it may be cheaper with  your insurance), but the website can give you the price if you did not use any insurance.  - You can print the associated coupon and take it with your prescription to the pharmacy.  - You may also stop by our office during regular business hours and pick up a GoodRx coupon card.  - If you need your prescription sent electronically to a different pharmacy, notify our office through [facility] or by phone at [phone] option 4.

## 2021-01-16 LAB — ANAEROBIC AND AEROBIC CULTURE

## 2021-01-17 ENCOUNTER — Other Ambulatory Visit
Admission: RE | Admit: 2021-01-17 | Discharge: 2021-01-17 | Disposition: A | Discharge status: Home / Self Care | Payer: Medicare Other | Source: Ambulatory Visit | Attending: Family | Admitting: Family

## 2021-01-17 ENCOUNTER — Other Ambulatory Visit: Payer: Self-pay | Admitting: Family

## 2021-01-17 ENCOUNTER — Encounter: Payer: Self-pay | Admitting: Family

## 2021-01-17 ENCOUNTER — Ambulatory Visit (HOSPITAL_BASED_OUTPATIENT_CLINIC_OR_DEPARTMENT_OTHER): Payer: Medicare Other | Admitting: Family

## 2021-01-17 ENCOUNTER — Other Ambulatory Visit: Payer: Self-pay

## 2021-01-17 VITALS — BP 126/50 | HR 81 | Resp 20 | Ht 63.0 in | Wt 253.1 lb

## 2021-01-17 DIAGNOSIS — G4733 Obstructive sleep apnea (adult) (pediatric): Secondary | ICD-10-CM | POA: Insufficient documentation

## 2021-01-17 DIAGNOSIS — I5032 Chronic diastolic (congestive) heart failure: Secondary | ICD-10-CM

## 2021-01-17 DIAGNOSIS — J449 Chronic obstructive pulmonary disease, unspecified: Secondary | ICD-10-CM | POA: Insufficient documentation

## 2021-01-17 DIAGNOSIS — Z794 Long term (current) use of insulin: Secondary | ICD-10-CM | POA: Insufficient documentation

## 2021-01-17 DIAGNOSIS — E119 Type 2 diabetes mellitus without complications: Secondary | ICD-10-CM | POA: Diagnosis not present

## 2021-01-17 DIAGNOSIS — Z7951 Long term (current) use of inhaled steroids: Secondary | ICD-10-CM | POA: Insufficient documentation

## 2021-01-17 DIAGNOSIS — Z9981 Dependence on supplemental oxygen: Secondary | ICD-10-CM | POA: Insufficient documentation

## 2021-01-17 DIAGNOSIS — Z7982 Long term (current) use of aspirin: Secondary | ICD-10-CM | POA: Insufficient documentation

## 2021-01-17 DIAGNOSIS — I10 Essential (primary) hypertension: Secondary | ICD-10-CM

## 2021-01-17 DIAGNOSIS — Z87891 Personal history of nicotine dependence: Secondary | ICD-10-CM | POA: Insufficient documentation

## 2021-01-17 DIAGNOSIS — E559 Vitamin D deficiency, unspecified: Secondary | ICD-10-CM | POA: Insufficient documentation

## 2021-01-17 DIAGNOSIS — F32A Depression, unspecified: Secondary | ICD-10-CM | POA: Insufficient documentation

## 2021-01-17 DIAGNOSIS — Z881 Allergy status to other antibiotic agents status: Secondary | ICD-10-CM | POA: Insufficient documentation

## 2021-01-17 DIAGNOSIS — Z885 Allergy status to narcotic agent status: Secondary | ICD-10-CM | POA: Insufficient documentation

## 2021-01-17 DIAGNOSIS — K219 Gastro-esophageal reflux disease without esophagitis: Secondary | ICD-10-CM | POA: Insufficient documentation

## 2021-01-17 DIAGNOSIS — Z7984 Long term (current) use of oral hypoglycemic drugs: Secondary | ICD-10-CM | POA: Insufficient documentation

## 2021-01-17 DIAGNOSIS — Z8249 Family history of ischemic heart disease and other diseases of the circulatory system: Secondary | ICD-10-CM | POA: Insufficient documentation

## 2021-01-17 DIAGNOSIS — I110 Hypertensive heart disease with heart failure: Secondary | ICD-10-CM | POA: Insufficient documentation

## 2021-01-17 DIAGNOSIS — Z79890 Hormone replacement therapy (postmenopausal): Secondary | ICD-10-CM | POA: Insufficient documentation

## 2021-01-17 DIAGNOSIS — M1990 Unspecified osteoarthritis, unspecified site: Secondary | ICD-10-CM | POA: Insufficient documentation

## 2021-01-17 DIAGNOSIS — Z7985 Long-term (current) use of injectable non-insulin antidiabetic drugs: Secondary | ICD-10-CM | POA: Insufficient documentation

## 2021-01-17 DIAGNOSIS — E1151 Type 2 diabetes mellitus with diabetic peripheral angiopathy without gangrene: Secondary | ICD-10-CM | POA: Insufficient documentation

## 2021-01-17 LAB — BASIC METABOLIC PANEL
Anion gap: 9 (ref 5–15)
BUN: 18 mg/dL (ref 8–23)
CO2: 27 mmol/L (ref 22–32)
Calcium: 9.3 mg/dL (ref 8.9–10.3)
Chloride: 101 mmol/L (ref 98–111)
Creatinine, Ser: 1.08 mg/dL — ABNORMAL HIGH (ref 0.44–1.00)
GFR, Estimated: 57 mL/min — ABNORMAL LOW (ref >60)
Glucose, Bld: 155 mg/dL — ABNORMAL HIGH (ref 70–99)
Potassium: 4.3 mmol/L (ref 3.5–5.1)
Sodium: 137 mmol/L (ref 135–145)

## 2021-01-17 LAB — MAGNESIUM: Magnesium: 2.3 mg/dL (ref 1.7–2.4)

## 2021-01-17 MED ORDER — POTASSIUM CITRATE ER 10 MEQ (1080 MG) PO TBCR
10.0000 meq | EXTENDED_RELEASE_TABLET | Freq: Every morning | ORAL | 3 refills | Status: DC
Start: 2021-01-17 — End: 2022-02-06

## 2021-01-17 MED ORDER — TORSEMIDE 20 MG PO TABS: ORAL_TABLET | ORAL | 3 refills | Status: DC

## 2021-01-17 NOTE — Patient Instructions (Signed)
Continue weighing daily and call for an overnight weight gain of > 2 pounds or a weekly weight gain of >5 pounds. 

## 2021-01-17 NOTE — Progress Notes (Signed)
Potassium/ torsemide RX sent to [facility]

## 2021-01-17 NOTE — Progress Notes (Signed)
Patient ID: [name] [name], female    DOB: 08-05-1955, 65 y.o.   MRN: [id number]  HPI  [name] [name] is a 65 y/o female with a history of vitamin D deficiency, obstructive sleep apnea, GERD, pneumonia, PVD, DM, depression, DJD, COPD with oxygen, allergies, past tobacco use and chronic heart failure.   Echo report on 06/06/19 reviewed and showed an EF of 60-65%. Echo done on 05/21/16 and showed an EF of 50% along with trivial MR/TR/PR. EF has declined slightly from June 2017.  Has not been admitted or been in the ED in the last 6 months.   She presents today for an urgent visit although hasn't been seen since August 2021. She presents with a chief complaint of generalized cramping. She says that this has been present off and on over the last few months but got worse over this weekend. She says that she has cramping in her hands, upper abdomen, legs and feet. She has associated fatigue, shortness of breath, light-headedness and gradual weight gain along with this. She denies any difficulty sleeping, abdominal distention, palpitations, pedal edema, chest pain or cough.   Past Medical History:  Diagnosis Date   Allergic rhinitis    Allergy    Asthma    Back pain    CHF (congestive heart failure) (HCC)    COPD (chronic obstructive pulmonary disease) (HCC)    Degenerative joint disease of knee, left    Depression    Diabetes mellitus without complication (HCC)    Edema    Elevated lipids    GERD (gastroesophageal reflux disease)    Hidradenitis    History of colonic polyps    Hypertension    Hypothyroidism    IBS (irritable bowel syndrome)    Insomnia    Neuropathy    feet and legs   Obesity    Oxygen dependent    Pneumonia    PONV (postoperative nausea and vomiting)    PVD (peripheral vascular disease) (HCC)    Sinusitis, chronic    Sleep apnea    Thyroid disease    Vaginitis, atrophic    Vertigo    Vitamin D deficiency    Past Surgical History:  Procedure Laterality Date    ABDOMINAL HYSTERECTOMY     AXILLARY HIDRADENITIS EXCISION     BREAST EXCISIONAL BIOPSY Bilateral 94/0768   RUPTURED FOLLICULAR CYSTS WITH ABSCESSES AND SCARRING, CONSISTENT WITH HIDRADENITIS SUPPURATIVA.    CATARACT EXTRACTION W/PHACO Right 07/27/2020   Procedure: CATARACT EXTRACTION PHACO AND INTRAOCULAR LENS PLACEMENT (IOC) RIGHT DIABETIC 17.13 01:23.9;  Surgeon: Birder Robson, MD;  Location: [facility];  Service: Ophthalmology;  Laterality: Right;  Diabetic - insulin  sleep apnea wants to be last   CESAREAN SECTION     x 2   CHOLECYSTECTOMY     HEEL SPUR EXCISION N/A    HYDRADENITIS EXCISION Right 12/31/2015   Procedure: EXCISION HIDRADENITIS AXILLA;  Surgeon: Clayburn Pert, MD;  Location: [facility];  Service: General;  Laterality: Right;   KNEE SURGERY Left    1998   TONSILLECTOMY     Family History  Problem Relation Age of Onset   Rashes / Skin problems Father    Hypertension Father    Heart disease Father    Breast cancer Paternal Grandmother    COPD Mother    Kidney cancer Neg Hx    Bladder Cancer Neg Hx    Social History   Tobacco Use   Smoking status: Former    Packs/day: 3.00  Years: 20.00    Pack years: 60.00    Types: Cigarettes    Quit date: 12/18/1993    Years since quitting: 27.1   Smokeless tobacco: Never  Substance Use Topics   Alcohol use: No   Allergies  Allergen Reactions   Codeine Itching   Levofloxacin Rash    "welts" & itching   Prior to Admission medications   Medication Sig Start Date End Date Taking? Authorizing Provider  acetaminophen (TYLENOL) 500 MG tablet Take 500 mg by mouth every 6 (six) hours as needed.   Yes [provider]  albuterol (PROVENTIL HFA;VENTOLIN HFA) 108 (90 Base) MCG/ACT inhaler Inhale 2 puffs into the lungs every 6 (six) hours as needed for wheezing or shortness of breath.   Yes [provider]  albuterol (PROVENTIL) (2.5 MG/3ML) 0.083% nebulizer solution Take 2.5 mg by nebulization  every 6 (six) hours as needed for wheezing or shortness of breath.    Yes [provider]  aspirin EC 81 MG tablet Take 81 mg by mouth daily.   Yes [provider]  atorvastatin (LIPITOR) 20 MG tablet Take 20 mg by mouth at bedtime.    Yes [provider]  budesonide-formoterol (SYMBICORT) 160-4.5 MCG/ACT inhaler Inhale 2 puffs into the lungs 2 (two) times daily.   Yes [provider]  doxepin (SINEQUAN) 50 MG capsule Take 50 mg by mouth at bedtime.   Yes [provider]  DULoxetine (CYMBALTA) 20 MG capsule Take 40 mg by mouth daily.   Yes [provider]  empagliflozin (JARDIANCE) 10 MG TABS tablet Take 10 mg by mouth daily.   Yes [provider]  fluticasone Asencion Islam) 50 MCG/ACT nasal spray  06/24/18  Yes [provider]  gabapentin (NEURONTIN) 300 MG capsule Take 300 mg by mouth 2 (two) times daily.  03/25/17  Yes [provider]  glucose blood test strip TEST three times a day 12/30/15  Yes [provider]  HUMULIN R U-500 KWIKPEN 500 UNIT/ML injection Inject 115 Units into the skin 2 (two) times daily with a meal.   Yes [provider]  ibuprofen (ADVIL,MOTRIN) 800 MG tablet Take 1 tablet (800 mg total) by mouth every 8 (eight) hours as needed for mild pain or moderate pain (with food). 12/21/16  Yes Eula Listen, MD  ketoconazole (NIZORAL) 2 % cream Apply twice daily for 2 weeks then as needed for rash to areas at lower abdomen. 11/13/19  Yes Moye, [place], MD  Lancets Misc. (ACCU-CHEK FASTCLIX LANCET) KIT  12/27/15  Yes [provider]  levocetirizine (XYZAL) 5 MG tablet Take 1 tablet (5 mg total) by mouth every evening. 01/06/21  Yes Moye, [place], MD  levothyroxine (SYNTHROID, LEVOTHROID) 200 MCG tablet Take 175 mcg by mouth daily before breakfast.   Yes [provider]  meclizine (ANTIVERT) 25 MG tablet Take 25 mg by mouth 3 (three) times daily as needed for dizziness.    Yes [provider]  Melatonin 5 MG CAPS Take 2 capsules by mouth daily.   Yes [provider]  mometasone (NASONEX) 50 MCG/ACT nasal spray Place 2 sprays into both nostrils daily.    Yes [provider]  montelukast (SINGULAIR) 10 MG tablet Take 10 mg by mouth at bedtime.    Yes [provider]  olopatadine (PATANOL) 0.1 % ophthalmic solution Place 1 drop into both eyes 2 (two) times daily.   Yes [provider]  omeprazole (PRILOSEC) 40 MG capsule Take 40 mg by mouth daily.  Yes [provider]  OXYGEN Inhale into the lungs. With CPAP   Yes [provider]  potassium citrate (UROCIT-K) 10 MEQ (1080 MG) SR tablet Take 1 tablet (10 mEq total) by mouth every morning. 11/11/19  Yes Gustavo Meditz A, FNP  roflumilast (DALIRESP) 500 MCG TABS tablet Take 500 mcg by mouth daily.   Yes [provider]  spironolactone (ALDACTONE) 50 MG tablet Take 100 mg by mouth daily.    Yes [provider]  torsemide (DEMADEX) 20 MG tablet TAKE  1 TABLETS BY MOUTH daily 11/11/19  Yes Darylene Price A, FNP  traZODone (DESYREL) 50 MG tablet Take 50-100 mg by mouth at bedtime as needed for sleep.    Yes [provider]  TRULICITY 1.5 XB/9.3JQ SOPN Inject 1.5 mg into the skin once a week. 05/02/19  Yes [provider]  fluocinonide (LIDEX) 0.05 % external solution Apply 1 application topically 2 (two) times daily.    [provider]  oxyCODONE-acetaminophen (PERCOCET/ROXICET) 5-325 MG tablet Take 1 tablet by mouth every 6 (six) hours as needed for severe pain. Patient not taking: Reported on 01/17/2021 09/29/19   Cuthriell, Charline Bills, PA-C   Review of Systems  Constitutional:  Positive for fatigue. Negative for appetite change.  HENT:  Negative for congestion, rhinorrhea and sore throat.   Eyes: Negative.   Respiratory:  Positive for shortness of breath (with minimal exertion). Negative for cough, chest tightness and  wheezing.   Cardiovascular:  Negative for chest pain, palpitations and leg swelling.  Gastrointestinal:  Negative for abdominal distention and abdominal pain.  Endocrine: Negative.   Genitourinary: Negative.   Musculoskeletal:  Positive for arthralgias (right shoulder). Negative for back pain and neck pain.  Skin: Negative.   Allergic/Immunologic: Negative.   Neurological:  Positive for light-headedness (with vertigo). Negative for dizziness.       Cramping in feet/ legs/ hands/ upper abdomen  Hematological:  Negative for adenopathy. Does not bruise/bleed easily.  Psychiatric/Behavioral:  Negative for dysphoric mood and sleep disturbance (sleeping on 1 pillow with CPAP/ oxygen). The patient is not nervous/anxious.    Vitals:   01/17/21 1051  BP: (!) 126/50  Pulse: 81  Resp: 20  SpO2: 92%  Weight: 253 lb 2 oz (114.8 kg)  Height: 5' 3"  (1.6 m)   Wt Readings from Last 3 Encounters:  01/17/21 253 lb 2 oz (114.8 kg)  07/27/20 257 lb (116.6 kg)  07/16/20 260 lb (117.9 kg)   Lab Results  Component Value Date   CREATININE 0.73 09/29/2019   CREATININE 0.85 06/25/2017   CREATININE 0.89 12/21/2016    Physical Exam Vitals and nursing note reviewed.  Constitutional:      Appearance: She is well-developed.  HENT:     Head: Normocephalic and atraumatic.  Neck:     Vascular: No JVD.  Cardiovascular:     Rate and Rhythm: Normal rate and regular rhythm.  Pulmonary:     Effort: Pulmonary effort is normal.     Breath sounds: No wheezing or rales.  Abdominal:     General: There is no distension.     Palpations: Abdomen is soft.     Tenderness: There is no abdominal tenderness.  Musculoskeletal:        General: No tenderness.     Cervical back: Normal range of motion and neck supple.     Right lower leg: No tenderness. No edema.     Left lower leg: No tenderness. No edema.  Skin:  General: Skin is warm and dry.  Neurological:     Mental Status: She is alert and oriented to  person, place, and time.  Psychiatric:        Behavior: Behavior normal.        Thought Content: Thought content normal.     Assessment & Plan:  1: Chronic heart failure with preserved ejection fraction without structural changes- - NYHA class III - euvolemic today - weighing daily; reminded to call for an overnight weight gain of 2 lbs or a weekly weight gain of 5 lbs - not adding salt and is using a salt substitute. Using meals on wheels for food; reminded her to follow a 2000 mg sodium diet - saw cardiology ([facility]) 06/23/19 - will get BMP/ Magnesium levels checked today since she's having generalized cramping - will need to get echo updated  - reports receiving her flu vaccine for this season - wearing CPAP nightly  2: HTN- - BP looks good (126/50) - follows with PCP at [facility] says that she saw them last week - BMP from 09/29/19 reviewed and showed sodium 137, potassium 4.3, creatinine 0.73 and GFR >60  3: Diabetes- - A1C on 05/21/16 was 7.0% - saw endocrinologist ([place]) 09/12/17   4: COPD- - continue inhalers  - wears oxygen at 2L at bedtime - saw pulmonologist ([place]) 12/17/19   Medication bottles reviewed.   Return in 1 month or sooner for any questions/problems before then.

## 2021-02-15 ENCOUNTER — Telehealth: Payer: Self-pay | Admitting: Family

## 2021-02-15 ENCOUNTER — Other Ambulatory Visit: Payer: Self-pay | Admitting: Family

## 2021-02-15 DIAGNOSIS — I5032 Chronic diastolic (congestive) heart failure: Secondary | ICD-10-CM

## 2021-02-15 NOTE — Telephone Encounter (Signed)
Have placed order to get echo updated. Will see patient after that.

## 2021-02-16 ENCOUNTER — Ambulatory Visit: Payer: Medicare Other | Admitting: Family

## 2021-03-22 ENCOUNTER — Ambulatory Visit: Payer: Medicare Other

## 2021-03-23 ENCOUNTER — Ambulatory Visit: Payer: Medicare Other | Admitting: Family

## 2021-04-04 ENCOUNTER — Ambulatory Visit
Admission: RE | Admit: 2021-04-04 | Discharge: 2021-04-04 | Disposition: A | Discharge status: Home / Self Care | Payer: Medicare Other | Source: Ambulatory Visit | Attending: Family | Admitting: Family

## 2021-04-04 ENCOUNTER — Other Ambulatory Visit: Payer: Self-pay

## 2021-04-04 DIAGNOSIS — I739 Peripheral vascular disease, unspecified: Secondary | ICD-10-CM | POA: Insufficient documentation

## 2021-04-04 DIAGNOSIS — I110 Hypertensive heart disease with heart failure: Secondary | ICD-10-CM | POA: Diagnosis not present

## 2021-04-04 DIAGNOSIS — I342 Nonrheumatic mitral (valve) stenosis: Secondary | ICD-10-CM | POA: Diagnosis not present

## 2021-04-04 DIAGNOSIS — I5032 Chronic diastolic (congestive) heart failure: Secondary | ICD-10-CM | POA: Diagnosis not present

## 2021-04-04 DIAGNOSIS — J449 Chronic obstructive pulmonary disease, unspecified: Secondary | ICD-10-CM | POA: Diagnosis not present

## 2021-04-04 DIAGNOSIS — I050 Rheumatic mitral stenosis: Secondary | ICD-10-CM

## 2021-04-04 DIAGNOSIS — I3481 Nonrheumatic mitral (valve) annulus calcification: Secondary | ICD-10-CM | POA: Diagnosis not present

## 2021-04-04 HISTORY — DX: Rheumatic mitral stenosis: I05.0

## 2021-04-04 LAB — ECHOCARDIOGRAM COMPLETE
AR max vel: 2.48 cm2
AV Area VTI: 2.98 cm2
AV Area mean vel: 2.35 cm2
AV Mean grad: 6 mmHg
AV Peak grad: 10.7 mmHg
Ao pk vel: 1.64 m/s
Area-P 1/2: 2.81 cm2
MV VTI: 1.95 cm2
S' Lateral: 2.9 cm

## 2021-04-04 NOTE — Progress Notes (Signed)
*  PRELIMINARY RESULTS* Echocardiogram 2D Echocardiogram has been performed.  Sherrie Sport 04/04/2021, 11:56 AM

## 2021-04-05 ENCOUNTER — Encounter: Payer: Self-pay | Admitting: Family

## 2021-04-05 ENCOUNTER — Ambulatory Visit: Payer: Medicare Other | Attending: Family | Admitting: Family

## 2021-04-05 VITALS — BP 129/60 | HR 84 | Resp 16 | Ht 64.0 in | Wt 250.0 lb

## 2021-04-05 DIAGNOSIS — M25511 Pain in right shoulder: Secondary | ICD-10-CM | POA: Insufficient documentation

## 2021-04-05 DIAGNOSIS — Z87891 Personal history of nicotine dependence: Secondary | ICD-10-CM | POA: Insufficient documentation

## 2021-04-05 DIAGNOSIS — K219 Gastro-esophageal reflux disease without esophagitis: Secondary | ICD-10-CM | POA: Insufficient documentation

## 2021-04-05 DIAGNOSIS — E119 Type 2 diabetes mellitus without complications: Secondary | ICD-10-CM | POA: Insufficient documentation

## 2021-04-05 DIAGNOSIS — Z9989 Dependence on other enabling machines and devices: Secondary | ICD-10-CM | POA: Diagnosis not present

## 2021-04-05 DIAGNOSIS — I5032 Chronic diastolic (congestive) heart failure: Secondary | ICD-10-CM | POA: Insufficient documentation

## 2021-04-05 DIAGNOSIS — J449 Chronic obstructive pulmonary disease, unspecified: Secondary | ICD-10-CM | POA: Insufficient documentation

## 2021-04-05 DIAGNOSIS — Z9981 Dependence on supplemental oxygen: Secondary | ICD-10-CM | POA: Diagnosis not present

## 2021-04-05 DIAGNOSIS — I739 Peripheral vascular disease, unspecified: Secondary | ICD-10-CM | POA: Insufficient documentation

## 2021-04-05 DIAGNOSIS — M1990 Unspecified osteoarthritis, unspecified site: Secondary | ICD-10-CM | POA: Insufficient documentation

## 2021-04-05 DIAGNOSIS — I10 Essential (primary) hypertension: Secondary | ICD-10-CM | POA: Diagnosis not present

## 2021-04-05 DIAGNOSIS — Z8701 Personal history of pneumonia (recurrent): Secondary | ICD-10-CM | POA: Diagnosis not present

## 2021-04-05 DIAGNOSIS — Z794 Long term (current) use of insulin: Secondary | ICD-10-CM

## 2021-04-05 DIAGNOSIS — I110 Hypertensive heart disease with heart failure: Secondary | ICD-10-CM | POA: Insufficient documentation

## 2021-04-05 DIAGNOSIS — R252 Cramp and spasm: Secondary | ICD-10-CM | POA: Diagnosis not present

## 2021-04-05 DIAGNOSIS — G4733 Obstructive sleep apnea (adult) (pediatric): Secondary | ICD-10-CM | POA: Diagnosis not present

## 2021-04-05 DIAGNOSIS — Z8249 Family history of ischemic heart disease and other diseases of the circulatory system: Secondary | ICD-10-CM | POA: Insufficient documentation

## 2021-04-05 DIAGNOSIS — Z7951 Long term (current) use of inhaled steroids: Secondary | ICD-10-CM | POA: Insufficient documentation

## 2021-04-05 DIAGNOSIS — E559 Vitamin D deficiency, unspecified: Secondary | ICD-10-CM | POA: Diagnosis not present

## 2021-04-05 DIAGNOSIS — F32A Depression, unspecified: Secondary | ICD-10-CM | POA: Diagnosis not present

## 2021-04-05 MED ORDER — SACUBITRIL-VALSARTAN 24-26 MG PO TABS: 1.0000 | ORAL_TABLET | Freq: Two times a day (BID) | ORAL | 3 refills | Status: DC

## 2021-04-05 NOTE — Patient Instructions (Signed)
[facility] will be moving around the corner to [id number] mid-February. Our phone number will remain the same.  Start entresto. This will help with your high blood pressure, protect your kidneys, and your heart.   Return in 1 month.

## 2021-04-05 NOTE — Progress Notes (Signed)
Patient ID: [name] [name], female    DOB: 09-Jul-1955, 65 y.o.   MRN: [id number]   [name] [name] is a 66 y/o female with a history of vitamin D deficiency, obstructive sleep apnea, GERD, pneumonia, PVD, DM, depression, DJD, COPD with oxygen, allergies, past tobacco use and chronic heart failure.   Echo report on 04/04/21 reviewed and showed an EF of 60-65%, Left atrial size was moderately dilated.  Echo report on 06/06/19 reviewed and showed an EF of 60-65%. Echo done on 05/21/16 and showed an EF of 50% along with trivial MR/TR/PR. EF has declined slightly from June 2017.  Has not been admitted or been in the ED in the last 6 months.   She presents today for a follow up appointment with a chief complaint of SOB and fatigue. She reports this is chronic in nature and has been present for several years. She denies any CP, palpitations, dizziness, syncope, cough, orthopnea, worsening pedal edema.   She weighs herself sporadically at home and says she is usually around 250. In the office today she weighs 250.  She checks her BS at home sporadically and says it typically runs "70-199".  Past Medical History:  Diagnosis Date   Allergic rhinitis    Allergy    Asthma    Back pain    CHF (congestive heart failure) (HCC)    COPD (chronic obstructive pulmonary disease) (HCC)    Degenerative joint disease of knee, left    Depression    Diabetes mellitus without complication (HCC)    Edema    Elevated lipids    GERD (gastroesophageal reflux disease)    Hidradenitis    History of colonic polyps    Hypertension    Hypothyroidism    IBS (irritable bowel syndrome)    Insomnia    Neuropathy    feet and legs   Obesity    Oxygen dependent    Pneumonia    PONV (postoperative nausea and vomiting)    PVD (peripheral vascular disease) (HCC)    Sinusitis, chronic    Sleep apnea    Thyroid disease    Vaginitis, atrophic    Vertigo    Vitamin D deficiency    Past Surgical History:  Procedure Laterality  Date   ABDOMINAL HYSTERECTOMY     AXILLARY HIDRADENITIS EXCISION     BREAST EXCISIONAL BIOPSY Bilateral 81/7711   RUPTURED FOLLICULAR CYSTS WITH ABSCESSES AND SCARRING, CONSISTENT WITH HIDRADENITIS SUPPURATIVA.    CATARACT EXTRACTION W/PHACO Right 07/27/2020   Procedure: CATARACT EXTRACTION PHACO AND INTRAOCULAR LENS PLACEMENT (IOC) RIGHT DIABETIC 17.13 01:23.9;  Surgeon: Birder Robson, MD;  Location: [facility];  Service: Ophthalmology;  Laterality: Right;  Diabetic - insulin  sleep apnea wants to be last   CESAREAN SECTION     x 2   CHOLECYSTECTOMY     HEEL SPUR EXCISION N/A    HYDRADENITIS EXCISION Right 12/31/2015   Procedure: EXCISION HIDRADENITIS AXILLA;  Surgeon: Clayburn Pert, MD;  Location: [facility];  Service: General;  Laterality: Right;   KNEE SURGERY Left    1998   TONSILLECTOMY     Family History  Problem Relation Age of Onset   Rashes / Skin problems Father    Hypertension Father    Heart disease Father    Breast cancer Paternal Grandmother    COPD Mother    Kidney cancer Neg Hx    Bladder Cancer Neg Hx    Social History   Tobacco Use   Smoking  status: Former    Packs/day: 3.00    Years: 20.00    Pack years: 60.00    Types: Cigarettes    Quit date: 12/18/1993    Years since quitting: 27.3   Smokeless tobacco: Never  Substance Use Topics   Alcohol use: No   Allergies  Allergen Reactions   Codeine Itching   Levofloxacin Rash    "welts" & itching   Prior to Admission medications   Medication Sig Start Date End Date Taking? Authorizing Provider  acetaminophen (TYLENOL) 500 MG tablet Take 500 mg by mouth every 6 (six) hours as needed.   Yes [provider]  albuterol (PROVENTIL HFA;VENTOLIN HFA) 108 (90 Base) MCG/ACT inhaler Inhale 2 puffs into the lungs every 6 (six) hours as needed for wheezing or shortness of breath.   Yes [provider]  albuterol (PROVENTIL) (2.5 MG/3ML) 0.083% nebulizer solution Take 2.5 mg by  nebulization every 6 (six) hours as needed for wheezing or shortness of breath.    Yes [provider]  aspirin EC 81 MG tablet Take 81 mg by mouth daily.   Yes [provider]  atorvastatin (LIPITOR) 20 MG tablet Take 20 mg by mouth at bedtime.    Yes [provider]  budesonide-formoterol (SYMBICORT) 160-4.5 MCG/ACT inhaler Inhale 2 puffs into the lungs 2 (two) times daily.   Yes [provider]  doxepin (SINEQUAN) 50 MG capsule Take 50 mg by mouth at bedtime.   Yes [provider]  DULoxetine (CYMBALTA) 20 MG capsule Take 40 mg by mouth daily.   Yes [provider]  empagliflozin (JARDIANCE) 10 MG TABS tablet Take 10 mg by mouth daily.   Yes [provider]  fluticasone Asencion Islam) 50 MCG/ACT nasal spray  06/24/18  Yes [provider]  gabapentin (NEURONTIN) 300 MG capsule Take 300 mg by mouth 2 (two) times daily.  03/25/17  Yes [provider]  glucose blood test strip TEST three times a day 12/30/15  Yes [provider]  HUMULIN R U-500 KWIKPEN 500 UNIT/ML injection Inject 115 Units into the skin 2 (two) times daily with a meal.   Yes [provider]  ibuprofen (ADVIL,MOTRIN) 800 MG tablet Take 1 tablet (800 mg total) by mouth every 8 (eight) hours as needed for mild pain or moderate pain (with food). 12/21/16  Yes Eula Listen, MD  ketoconazole (NIZORAL) 2 % cream Apply twice daily for 2 weeks then as needed for rash to areas at lower abdomen. 11/13/19  Yes Moye, [place], MD  Lancets Misc. (ACCU-CHEK FASTCLIX LANCET) KIT  12/27/15  Yes [provider]  levocetirizine (XYZAL) 5 MG tablet Take 1 tablet (5 mg total) by mouth every evening. 01/06/21  Yes Moye, [place], MD  levothyroxine (SYNTHROID, LEVOTHROID) 200 MCG tablet Take 175 mcg by mouth daily before breakfast.   Yes [provider]  meclizine (ANTIVERT) 25 MG tablet Take 25 mg by mouth 3 (three) times daily as needed for  dizziness.   Yes [provider]  Melatonin 5 MG CAPS Take 2 capsules by mouth daily.   Yes [provider]  mometasone (NASONEX) 50 MCG/ACT nasal spray Place 2 sprays into both nostrils daily.    Yes [provider]  montelukast (SINGULAIR) 10 MG tablet Take 10 mg by mouth at bedtime.    Yes [provider]  olopatadine (PATANOL) 0.1 % ophthalmic solution Place 1 drop into both eyes 2 (two) times daily.   Yes [provider]  omeprazole (  PRILOSEC) 40 MG capsule Take 40 mg by mouth daily.   Yes [provider]  OXYGEN Inhale into the lungs. With CPAP   Yes [provider]  potassium citrate (UROCIT-K) 10 MEQ (1080 MG) SR tablet Take 1 tablet (10 mEq total) by mouth every morning. 11/11/19  Yes Hackney, Tina A, FNP  roflumilast (DALIRESP) 500 MCG TABS tablet Take 500 mcg by mouth daily.   Yes [provider]  spironolactone (ALDACTONE) 50 MG tablet Take 100 mg by mouth daily.    Yes [provider]  torsemide (DEMADEX) 20 MG tablet TAKE  1 TABLETS BY MOUTH daily 11/11/19  Yes Darylene Price A, FNP  traZODone (DESYREL) 50 MG tablet Take 50-100 mg by mouth at bedtime as needed for sleep.    Yes [provider]  TRULICITY 1.5 KG/2.5KY SOPN Inject 1.5 mg into the skin once a week. 05/02/19  Yes [provider]  fluocinonide (LIDEX) 0.05 % external solution Apply 1 application topically 2 (two) times daily.    [provider]  oxyCODONE-acetaminophen (PERCOCET/ROXICET) 5-325 MG tablet Take 1 tablet by mouth every 6 (six) hours as needed for severe pain. Patient not taking: Reported on 01/17/2021 09/29/19   Cuthriell, Charline Bills, PA-C   Review of Systems  Constitutional:  Positive for fatigue. Negative for appetite change.  HENT:  Negative for congestion, rhinorrhea and sore throat.   Eyes: Negative.   Respiratory:  Positive for shortness of breath (with minimal exertion). Negative for cough, chest  tightness and wheezing.   Cardiovascular:  Negative for chest pain, palpitations and leg swelling.  Gastrointestinal:  Negative for abdominal distention and abdominal pain.  Endocrine: Negative.   Genitourinary: Negative.   Musculoskeletal:  Positive for arthralgias (right shoulder). Negative for back pain and neck pain.  Skin: Negative.   Allergic/Immunologic: Negative.   Neurological:  Negative for dizziness and light-headedness (with vertigo).       Cramping in feet/ legs/ hands/ upper abdomen  Hematological:  Negative for adenopathy. Does not bruise/bleed easily.  Psychiatric/Behavioral:  Negative for dysphoric mood and sleep disturbance (sleeping on 1 pillow with CPAP/ oxygen). The patient is not nervous/anxious.    Vitals:   04/05/21 1404  BP: 129/60  Pulse: 84  Resp: 16  SpO2: 94%  Weight: 250 lb (113.4 kg)  Height: _0  (1.626 m)   Wt Readings from Last 3 Encounters:  04/05/21 250 lb (113.4 kg)  01/17/21 253 lb 2 oz (114.8 kg)  07/27/20 257 lb (116.6 kg)   Lab Results  Component Value Date   CREATININE 1.08 (H) 01/17/2021   CREATININE 0.73 09/29/2019   CREATININE 0.85 06/25/2017    Physical Exam Vitals and nursing note reviewed.  Constitutional:      General: She is not in acute distress.    Appearance: She is well-developed. She is obese.  HENT:     Head: Normocephalic and atraumatic.  Neck:     Vascular: No JVD.  Cardiovascular:     Rate and Rhythm: Normal rate and regular rhythm.     Pulses: Normal pulses.  Pulmonary:     Effort: Pulmonary effort is normal.     Breath sounds: No wheezing or rales.  Abdominal:     General: There is no distension.     Palpations: Abdomen is soft.     Tenderness: There is no abdominal tenderness.  Musculoskeletal:        General: No tenderness.     Cervical back: Normal range of motion  and neck supple.     Right lower leg: No tenderness. No edema.     Left lower leg: No tenderness. No edema.  Skin:    General: Skin  is warm and dry.  Neurological:     Mental Status: She is alert and oriented to person, place, and time.  Psychiatric:        Behavior: Behavior normal.        Thought Content: Thought content normal.     Assessment & Plan:  1: Chronic heart failure with preserved ejection fraction with structural changes- - NYHA class III - euvolemic today - weighing sporadically; reminded to call for an overnight weight gain of 2 lbs or a weekly weight gain of 5 lbs - not adding salt and is using a salt substitute. Using meals on wheels for food; reminded her to follow a 2000 mg sodium diet - saw cardiology ([facility]) 06/23/19 - echo completed on 04/04/21, showed LA enlargement - will start entresto 24/26; 30 voucher provided - will check BMP next visit - reports receiving her flu vaccine for this season - wearing CPAP nightly  2: HTN- - BP looks good (129/60) - follows with PCP at [facility] & saw them last week  - BMP from 09/29/19 reviewed and showed sodium 137, potassium 4.3, creatinine 0.73 and GFR >60  3: Diabetes- - A1C on 05/21/16 was 7.0% - saw endocrinologist ([place]) 09/12/17   4: COPD- - continue inhalers  - wears oxygen at 2L at bedtime - saw pulmonologist ([place]) 12/17/19   Medication bottles reviewed.   Return in 1 month or sooner for any questions/problems before then.

## 2021-04-06 ENCOUNTER — Encounter: Payer: Self-pay | Admitting: Family

## 2021-04-28 ENCOUNTER — Other Ambulatory Visit: Payer: Self-pay | Admitting: Dermatology

## 2021-04-28 DIAGNOSIS — L299 Pruritus, unspecified: Secondary | ICD-10-CM

## 2021-05-07 NOTE — Progress Notes (Unsigned)
Patient ID: [name] [name], female    DOB: 03-22-1955, 66 y.o.   MRN: [id number]   [name] [name] is a 66 y/o female with a history of vitamin D deficiency, obstructive sleep apnea, GERD, pneumonia, PVD, DM, depression, DJD, COPD with oxygen, allergies, past tobacco use and chronic heart failure.   Echo report on 04/04/21 reviewed and showed an EF of 60-65%, Left atrial size was moderately dilated. Echo report on 06/06/19 reviewed and showed an EF of 60-65%. Echo done on 05/21/16 and showed an EF of 50% along with trivial MR/TR/PR. EF has declined slightly from June 2017.  Has not been admitted or been in the ED in the last 6 months.   She presents today for a follow up appointment with a chief complaint of   Past Medical History:  Diagnosis Date   Allergic rhinitis    Allergy    Asthma    Back pain    CHF (congestive heart failure) (HCC)    COPD (chronic obstructive pulmonary disease) (HCC)    Degenerative joint disease of knee, left    Depression    Diabetes mellitus without complication (HCC)    Edema    Elevated lipids    GERD (gastroesophageal reflux disease)    Hidradenitis    History of colonic polyps    Hypertension    Hypothyroidism    IBS (irritable bowel syndrome)    Insomnia    Neuropathy    feet and legs   Obesity    Oxygen dependent    Pneumonia    PONV (postoperative nausea and vomiting)    PVD (peripheral vascular disease) (HCC)    Sinusitis, chronic    Sleep apnea    Thyroid disease    Vaginitis, atrophic    Vertigo    Vitamin D deficiency    Past Surgical History:  Procedure Laterality Date   ABDOMINAL HYSTERECTOMY     AXILLARY HIDRADENITIS EXCISION     BREAST EXCISIONAL BIOPSY Bilateral 45/8099   RUPTURED FOLLICULAR CYSTS WITH ABSCESSES AND SCARRING, CONSISTENT WITH HIDRADENITIS SUPPURATIVA.    CATARACT EXTRACTION W/PHACO Right 07/27/2020   Procedure: CATARACT EXTRACTION PHACO AND INTRAOCULAR LENS PLACEMENT (IOC) RIGHT DIABETIC 17.13 01:23.9;  Surgeon:  Birder Robson, MD;  Location: [facility];  Service: Ophthalmology;  Laterality: Right;  Diabetic - insulin  sleep apnea wants to be last   CESAREAN SECTION     x 2   CHOLECYSTECTOMY     HEEL SPUR EXCISION N/A    HYDRADENITIS EXCISION Right 12/31/2015   Procedure: EXCISION HIDRADENITIS AXILLA;  Surgeon: Clayburn Pert, MD;  Location: [facility];  Service: General;  Laterality: Right;   KNEE SURGERY Left    1998   TONSILLECTOMY     Family History  Problem Relation Age of Onset   Rashes / Skin problems Father    Hypertension Father    Heart disease Father    Breast cancer Paternal Grandmother    COPD Mother    Kidney cancer Neg Hx    Bladder Cancer Neg Hx    Social History   Tobacco Use   Smoking status: Former    Packs/day: 3.00    Years: 20.00    Pack years: 60.00    Types: Cigarettes    Quit date: 12/18/1993    Years since quitting: 27.4   Smokeless tobacco: Never  Substance Use Topics   Alcohol use: No   Allergies  Allergen Reactions   Codeine Itching   Levofloxacin Rash    "  welts" & itching    Review of Systems  Constitutional:  Positive for fatigue. Negative for appetite change.  HENT:  Negative for congestion, rhinorrhea and sore throat.   Eyes: Negative.   Respiratory:  Positive for shortness of breath (with minimal exertion). Negative for cough, chest tightness and wheezing.   Cardiovascular:  Negative for chest pain, palpitations and leg swelling.  Gastrointestinal:  Negative for abdominal distention and abdominal pain.  Endocrine: Negative.   Genitourinary: Negative.   Musculoskeletal:  Positive for arthralgias (right shoulder). Negative for back pain and neck pain.  Skin: Negative.   Allergic/Immunologic: Negative.   Neurological:  Negative for dizziness and light-headedness (with vertigo).       Cramping in feet/ legs/ hands/ upper abdomen  Hematological:  Negative for adenopathy. Does not bruise/bleed easily.  Psychiatric/Behavioral:   Negative for dysphoric mood and sleep disturbance (sleeping on 1 pillow with CPAP/ oxygen). The patient is not nervous/anxious.       Physical Exam Vitals and nursing note reviewed.  Constitutional:      General: She is not in acute distress.    Appearance: She is well-developed. She is obese.  HENT:     Head: Normocephalic and atraumatic.  Neck:     Vascular: No JVD.  Cardiovascular:     Rate and Rhythm: Normal rate and regular rhythm.     Pulses: Normal pulses.  Pulmonary:     Effort: Pulmonary effort is normal.     Breath sounds: No wheezing or rales.  Abdominal:     General: There is no distension.     Palpations: Abdomen is soft.     Tenderness: There is no abdominal tenderness.  Musculoskeletal:        General: No tenderness.     Cervical back: Normal range of motion and neck supple.     Right lower leg: No tenderness. No edema.     Left lower leg: No tenderness. No edema.  Skin:    General: Skin is warm and dry.  Neurological:     Mental Status: She is alert and oriented to person, place, and time.  Psychiatric:        Behavior: Behavior normal.        Thought Content: Thought content normal.     Assessment & Plan:  1: Chronic heart failure with preserved ejection fraction with structural changes- - NYHA class III - euvolemic today - weighing sporadically; reminded to call for an overnight weight gain of 2 lbs or a weekly weight gain of 5 lbs - weight 250 pounds from last visit here 1 month ago - not adding salt and is using a salt substitute. Using meals on wheels for food; reminded her to follow a 2000 mg sodium diet - saw cardiology ([place]) 06/23/19 - on GDMT of entresto - check BMP today due to starting entresto at last visit - wearing CPAP nightly  2: HTN- - BP  - follows with PCP at [facility] & saw them  - BMP from 01/17/21 reviewed and showed sodium 137, potassium 4.3, creatinine 1.08 and GFR 57  3: Diabetes- - A1C on  05/21/16 was 7.0% - saw endocrinologist ([place]) 09/12/17   4: COPD- - continue inhalers  - wears oxygen at 2L at bedtime - saw pulmonologist ([place]) 12/17/19   Medication bottles reviewed.

## 2021-05-09 ENCOUNTER — Ambulatory Visit: Payer: Medicare Other | Admitting: Family

## 2021-05-12 ENCOUNTER — Other Ambulatory Visit: Payer: Self-pay

## 2021-05-12 ENCOUNTER — Other Ambulatory Visit
Admission: RE | Admit: 2021-05-12 | Discharge: 2021-05-12 | Disposition: A | Discharge status: Home / Self Care | Payer: Medicare Other | Source: Ambulatory Visit | Attending: Family | Admitting: Family

## 2021-05-12 ENCOUNTER — Encounter: Payer: Self-pay | Admitting: Family

## 2021-05-12 ENCOUNTER — Ambulatory Visit (HOSPITAL_BASED_OUTPATIENT_CLINIC_OR_DEPARTMENT_OTHER): Payer: Medicare Other | Admitting: Family

## 2021-05-12 VITALS — BP 101/49 | HR 85 | Resp 18 | Ht 65.0 in | Wt 247.0 lb

## 2021-05-12 DIAGNOSIS — Z8701 Personal history of pneumonia (recurrent): Secondary | ICD-10-CM | POA: Insufficient documentation

## 2021-05-12 DIAGNOSIS — I10 Essential (primary) hypertension: Secondary | ICD-10-CM | POA: Diagnosis not present

## 2021-05-12 DIAGNOSIS — I5032 Chronic diastolic (congestive) heart failure: Secondary | ICD-10-CM | POA: Insufficient documentation

## 2021-05-12 DIAGNOSIS — Z87891 Personal history of nicotine dependence: Secondary | ICD-10-CM | POA: Insufficient documentation

## 2021-05-12 DIAGNOSIS — J449 Chronic obstructive pulmonary disease, unspecified: Secondary | ICD-10-CM | POA: Insufficient documentation

## 2021-05-12 DIAGNOSIS — E559 Vitamin D deficiency, unspecified: Secondary | ICD-10-CM | POA: Insufficient documentation

## 2021-05-12 DIAGNOSIS — Z7951 Long term (current) use of inhaled steroids: Secondary | ICD-10-CM | POA: Insufficient documentation

## 2021-05-12 DIAGNOSIS — Z794 Long term (current) use of insulin: Secondary | ICD-10-CM

## 2021-05-12 DIAGNOSIS — Z9981 Dependence on supplemental oxygen: Secondary | ICD-10-CM | POA: Insufficient documentation

## 2021-05-12 DIAGNOSIS — M25511 Pain in right shoulder: Secondary | ICD-10-CM | POA: Insufficient documentation

## 2021-05-12 DIAGNOSIS — K219 Gastro-esophageal reflux disease without esophagitis: Secondary | ICD-10-CM | POA: Insufficient documentation

## 2021-05-12 DIAGNOSIS — Z7952 Long term (current) use of systemic steroids: Secondary | ICD-10-CM | POA: Insufficient documentation

## 2021-05-12 DIAGNOSIS — M1990 Unspecified osteoarthritis, unspecified site: Secondary | ICD-10-CM | POA: Insufficient documentation

## 2021-05-12 DIAGNOSIS — E1151 Type 2 diabetes mellitus with diabetic peripheral angiopathy without gangrene: Secondary | ICD-10-CM | POA: Insufficient documentation

## 2021-05-12 DIAGNOSIS — G4733 Obstructive sleep apnea (adult) (pediatric): Secondary | ICD-10-CM | POA: Insufficient documentation

## 2021-05-12 DIAGNOSIS — Z79899 Other long term (current) drug therapy: Secondary | ICD-10-CM | POA: Insufficient documentation

## 2021-05-12 DIAGNOSIS — Z881 Allergy status to other antibiotic agents status: Secondary | ICD-10-CM | POA: Insufficient documentation

## 2021-05-12 DIAGNOSIS — I110 Hypertensive heart disease with heart failure: Secondary | ICD-10-CM | POA: Insufficient documentation

## 2021-05-12 DIAGNOSIS — E119 Type 2 diabetes mellitus without complications: Secondary | ICD-10-CM | POA: Diagnosis not present

## 2021-05-12 DIAGNOSIS — Z7984 Long term (current) use of oral hypoglycemic drugs: Secondary | ICD-10-CM | POA: Insufficient documentation

## 2021-05-12 DIAGNOSIS — Z885 Allergy status to narcotic agent status: Secondary | ICD-10-CM | POA: Insufficient documentation

## 2021-05-12 DIAGNOSIS — Z9989 Dependence on other enabling machines and devices: Secondary | ICD-10-CM | POA: Insufficient documentation

## 2021-05-12 DIAGNOSIS — F32A Depression, unspecified: Secondary | ICD-10-CM | POA: Insufficient documentation

## 2021-05-12 LAB — BASIC METABOLIC PANEL
Anion gap: 9 (ref 5–15)
BUN: 33 mg/dL — ABNORMAL HIGH (ref 8–23)
CO2: 25 mmol/L (ref 22–32)
Calcium: 9.2 mg/dL (ref 8.9–10.3)
Chloride: 98 mmol/L (ref 98–111)
Creatinine, Ser: 1.16 mg/dL — ABNORMAL HIGH (ref 0.44–1.00)
GFR, Estimated: 53 mL/min — ABNORMAL LOW (ref >60)
Glucose, Bld: 195 mg/dL — ABNORMAL HIGH (ref 70–99)
Potassium: 4.4 mmol/L (ref 3.5–5.1)
Sodium: 132 mmol/L — ABNORMAL LOW (ref 135–145)

## 2021-05-12 NOTE — Patient Instructions (Signed)
Continue weighing daily and call for an overnight weight gain of 3 pounds or more or a weekly weight gain of more than 5 pounds.  °

## 2021-05-12 NOTE — Progress Notes (Signed)
Patient ID: [name] [name], female    DOB: May 03, 1955, 66 y.o.   MRN: [id number]   Ms Spear is a 66 y/o female with a history of vitamin D deficiency, obstructive sleep apnea, GERD, pneumonia, PVD, DM, depression, DJD, COPD with oxygen, allergies, past tobacco use and chronic heart failure.   Echo report on 04/04/21 reviewed and showed an EF of 60-65%, Left atrial size was moderately dilated. Echo report on 06/06/19 reviewed and showed an EF of 60-65%. Echo done on 05/21/16 and showed an EF of 50% along with trivial MR/TR/PR. EF has declined slightly from June 2017.  Has not been admitted or been in the ED in the last 6 months.   She presents today for a follow up appointment with a chief complaint of minimal shortness of breath upon moderate exertion. She describes this as chronic in nature although has improved since she was put on prednisone. She has associated fatigue, light-headedness (vertigo) & right shoulder pain along with this. She denies any difficulty sleeping, abdominal distention, palpitations, pedal edema, chest pain, cough or weight gain.   Tolerating entresto without known side effects.  Past Medical History:  Diagnosis Date   Allergic rhinitis    Allergy    Asthma    Back pain    CHF (congestive heart failure) (HCC)    COPD (chronic obstructive pulmonary disease) (HCC)    Degenerative joint disease of knee, left    Depression    Diabetes mellitus without complication (HCC)    Edema    Elevated lipids    GERD (gastroesophageal reflux disease)    Hidradenitis    History of colonic polyps    Hypertension    Hypothyroidism    IBS (irritable bowel syndrome)    Insomnia    Neuropathy    feet and legs   Obesity    Oxygen dependent    Pneumonia    PONV (postoperative nausea and vomiting)    PVD (peripheral vascular disease) (HCC)    Sinusitis, chronic    Sleep apnea    Thyroid disease    Vaginitis, atrophic    Vertigo    Vitamin D deficiency    Past Surgical  History:  Procedure Laterality Date   ABDOMINAL HYSTERECTOMY     AXILLARY HIDRADENITIS EXCISION     BREAST EXCISIONAL BIOPSY Bilateral 77/8242   RUPTURED FOLLICULAR CYSTS WITH ABSCESSES AND SCARRING, CONSISTENT WITH HIDRADENITIS SUPPURATIVA.    CATARACT EXTRACTION W/PHACO Right 07/27/2020   Procedure: CATARACT EXTRACTION PHACO AND INTRAOCULAR LENS PLACEMENT (IOC) RIGHT DIABETIC 17.13 01:23.9;  Surgeon: Birder Robson, MD;  Location: [facility];  Service: Ophthalmology;  Laterality: Right;  Diabetic - insulin  sleep apnea wants to be last   CESAREAN SECTION     x 2   CHOLECYSTECTOMY     HEEL SPUR EXCISION N/A    HYDRADENITIS EXCISION Right 12/31/2015   Procedure: EXCISION HIDRADENITIS AXILLA;  Surgeon: Clayburn Pert, MD;  Location: [facility];  Service: General;  Laterality: Right;   KNEE SURGERY Left    1998   TONSILLECTOMY     Family History  Problem Relation Age of Onset   Rashes / Skin problems Father    Hypertension Father    Heart disease Father    Breast cancer Paternal Grandmother    COPD Mother    Kidney cancer Neg Hx    Bladder Cancer Neg Hx    Social History   Tobacco Use   Smoking status: Former    Packs/day:  3.00    Years: 20.00    Pack years: 60.00    Types: Cigarettes    Quit date: 12/18/1993    Years since quitting: 27.4   Smokeless tobacco: Never  Substance Use Topics   Alcohol use: No   Allergies  Allergen Reactions   Codeine Itching   Levofloxacin Rash    "welts" & itching   Prior to Admission medications   Medication Sig Start Date End Date Taking? Authorizing Provider  acetaminophen (TYLENOL) 500 MG tablet Take 500 mg by mouth every 6 (six) hours as needed.   Yes [provider]  albuterol (PROVENTIL HFA;VENTOLIN HFA) 108 (90 Base) MCG/ACT inhaler Inhale 2 puffs into the lungs every 6 (six) hours as needed for wheezing or shortness of breath.   Yes [provider]  albuterol (PROVENTIL) (2.5 MG/3ML) 0.083% nebulizer  solution Take 2.5 mg by nebulization every 6 (six) hours as needed for wheezing or shortness of breath.    Yes [provider]  aspirin EC 81 MG tablet Take 81 mg by mouth daily.   Yes [provider]  atorvastatin (LIPITOR) 20 MG tablet Take 20 mg by mouth at bedtime.    Yes [provider]  budesonide-formoterol (SYMBICORT) 160-4.5 MCG/ACT inhaler Inhale 2 puffs into the lungs 2 (two) times daily.   Yes [provider]  cetirizine (ZYRTEC) 10 MG tablet Take 10 mg by mouth daily.   Yes [provider]  doxepin (SINEQUAN) 50 MG capsule Take 50 mg by mouth at bedtime.   Yes [provider]  DULoxetine (CYMBALTA) 20 MG capsule Take 40 mg by mouth daily.   Yes [provider]  empagliflozin (JARDIANCE) 10 MG TABS tablet Take 10 mg by mouth daily.   Yes [provider]  fluticasone Asencion Islam) 50 MCG/ACT nasal spray  06/24/18  Yes [provider]  gabapentin (NEURONTIN) 300 MG capsule Take 300 mg by mouth 2 (two) times daily.  03/25/17  Yes [provider]  glucose blood test strip TEST three times a day 12/30/15  Yes [provider]  HUMULIN R U-500 KWIKPEN 500 UNIT/ML injection Inject 115 Units into the skin 2 (two) times daily with a meal.   Yes [provider]  ibuprofen (ADVIL,MOTRIN) 800 MG tablet Take 1 tablet (800 mg total) by mouth every 8 (eight) hours as needed for mild pain or moderate pain (with food). 12/21/16  Yes Eula Listen, MD  Lancets Misc. (ACCU-CHEK FASTCLIX LANCET) KIT  12/27/15  Yes [provider]  levothyroxine (SYNTHROID, LEVOTHROID) 200 MCG tablet Take 175 mcg by mouth daily before breakfast.   Yes [provider]  meclizine (ANTIVERT) 25 MG tablet Take 25 mg by mouth 3 (three) times daily as needed for dizziness.   Yes [provider]  Melatonin 5 MG CAPS Take 2 capsules by mouth daily.   Yes [provider]  mometasone (NASONEX) 50  MCG/ACT nasal spray Place 2 sprays into both nostrils daily.    Yes [provider]  montelukast (SINGULAIR) 10 MG tablet Take 10 mg by mouth at bedtime.    Yes [provider]  olopatadine (PATANOL) 0.1 % ophthalmic solution Place 1 drop into both eyes 2 (two) times daily.   Yes [provider]  omeprazole (PRILOSEC) 40 MG capsule Take 40 mg by mouth daily.   Yes [provider]  oxyCODONE-acetaminophen (PERCOCET/ROXICET) 5-325 MG tablet Take 1 tablet by mouth every 6 (six) hours as needed for severe pain. 09/29/19  Yes Cuthriell, Charline Bills, PA-C  OXYGEN Inhale into the lungs. With CPAP   Yes [provider]  potassium citrate (UROCIT-K) 10 MEQ (1080 MG) SR tablet Take 1 tablet (10 mEq total) by mouth every morning. 01/17/21  Yes Fiora Weill A, FNP  predniSONE (STERAPRED UNI-PAK 21 TAB) 10 MG (21) TBPK tablet Take by mouth daily.   Yes [provider]  roflumilast (DALIRESP) 500 MCG TABS tablet Take 500 mcg by mouth daily.   Yes [provider]  sacubitril-valsartan (ENTRESTO) 24-26 MG Take 1 tablet by mouth 2 (two) times daily. 04/05/21  Yes Aleicia Kenagy A, FNP  Semaglutide,0.25 or 0.5MG/DOS, (OZEMPIC, 0.25 OR 0.5 MG/DOSE,) 2 MG/1.5ML SOPN Inject into the skin once a week.   Yes [provider]  spironolactone (ALDACTONE) 50 MG tablet Take 100 mg by mouth daily.    Yes [provider]  torsemide (DEMADEX) 20 MG tablet TAKE  1 TABLETS BY MOUTH daily 01/17/21  Yes Darylene Price A, FNP  traZODone (DESYREL) 50 MG tablet Take 50-100 mg by mouth at bedtime as needed for sleep.    Yes [provider]  ketoconazole (NIZORAL) 2 % cream Apply twice daily for 2 weeks then as needed for rash to areas at lower abdomen. Patient not taking: Reported on 04/05/2021 11/13/19   Laurence Ferrari, [place], MD  levocetirizine (XYZAL) 5 MG tablet TAKE 1 TABLET BY MOUTH EVERY EVENING 04/28/21   Moye, [place], MD    Review of Systems   Constitutional:  Positive for fatigue. Negative for appetite change.  HENT:  Negative for congestion, rhinorrhea and sore throat.   Eyes: Negative.   Respiratory:  Positive for shortness of breath (with moderate exertion). Negative for cough, chest tightness and wheezing.   Cardiovascular:  Negative for chest pain, palpitations and leg swelling.  Gastrointestinal:  Negative for abdominal distention and abdominal pain.  Endocrine: Negative.   Genitourinary: Negative.   Musculoskeletal:  Positive for arthralgias (right shoulder). Negative for back pain and neck pain.  Skin: Negative.   Allergic/Immunologic: Negative.   Neurological:  Positive for light-headedness (with vertigo). Negative for dizziness.  Hematological:  Negative for adenopathy. Does not bruise/bleed easily.  Psychiatric/Behavioral:  Negative for dysphoric mood and sleep disturbance (sleeping on 1 pillow with CPAP/ oxygen). The patient is not nervous/anxious.    Vitals:   05/12/21 1448  BP: (!) 101/49  Pulse: 85  Resp: 18  SpO2: 98%  Weight: 247 lb (112 kg)  Height: 5' 5"  (1.651 m)   Wt Readings from Last 3 Encounters:  05/12/21 247 lb (112 kg)  04/05/21 250 lb (113.4 kg)  01/17/21 253 lb 2 oz (114.8 kg)   Lab Results  Component Value Date   CREATININE 1.08 (H) 01/17/2021   CREATININE 0.73 09/29/2019   CREATININE 0.85 06/25/2017   Physical Exam Vitals and nursing note reviewed.  Constitutional:      General: She is not in acute distress.    Appearance: She is well-developed. She is obese.  HENT:     Head: Normocephalic and atraumatic.  Neck:     Vascular: No JVD.  Cardiovascular:     Rate and Rhythm: Normal rate and regular rhythm.     Pulses: Normal pulses.  Pulmonary:     Effort: Pulmonary effort is normal.     Breath sounds: No wheezing or rales.  Abdominal:     General: There is no distension.     Palpations: Abdomen is soft.     Tenderness: There is no  abdominal tenderness.  Musculoskeletal:         General: No tenderness.     Cervical back: Normal range of motion and neck supple.     Right lower leg: No tenderness. No edema.     Left lower leg: No tenderness. No edema.  Skin:    General: Skin is warm and dry.  Neurological:     Mental Status: She is alert and oriented to person, place, and time.  Psychiatric:        Behavior: Behavior normal.        Thought Content: Thought content normal.     Assessment & Plan:  1: Chronic heart failure with preserved ejection fraction with structural changes- - NYHA class II - euvolemic today - weighing sporadically; reminded to call for an overnight weight gain of 2 lbs or a weekly weight gain of 5 lbs - weight down 3 pounds from last visit here 1 month ago - not adding salt and is using a salt substitute. Using meals on wheels for food; reminded her to follow a 2000 mg sodium diet - saw cardiology ([place]) 06/23/19 - on GDMT of entresto & jardiance - check BMP today due to starting entresto at last visit - current BP will not allow for entresto titration - wearing CPAP nightly  2: HTN- - BP looks good (101/49) - follows with PCP at [facility] from 01/17/21 reviewed and showed sodium 137, potassium 4.3, creatinine 1.08 and GFR 57  3: Diabetes- - A1C on 05/21/16 was 7.0% - saw endocrinologist ([place]) 09/12/17   4: COPD- - continue inhalers  - wears oxygen at 2L at bedtime - saw pulmonologist ([place]) 12/17/19 - currently finishing prednisone taper   Medication list reviewed.   Return in 2 months, sooner if needed.

## 2021-06-02 ENCOUNTER — Ambulatory Visit
Admission: RE | Admit: 2021-06-02 | Discharge: 2021-06-02 | Disposition: A | Discharge status: Home / Self Care | Payer: Medicare Other | Source: Ambulatory Visit | Attending: Family Medicine | Admitting: Family Medicine

## 2021-06-02 ENCOUNTER — Other Ambulatory Visit: Payer: Self-pay | Admitting: Family Medicine

## 2021-06-02 DIAGNOSIS — J449 Chronic obstructive pulmonary disease, unspecified: Secondary | ICD-10-CM | POA: Diagnosis not present

## 2021-06-02 DIAGNOSIS — R053 Chronic cough: Secondary | ICD-10-CM | POA: Insufficient documentation

## 2021-07-02 ENCOUNTER — Other Ambulatory Visit: Payer: Self-pay | Admitting: Family

## 2021-07-09 NOTE — Progress Notes (Deleted)
Patient ID: [name] [name], female    DOB: 1955/08/28, 66 y.o.   MRN: [id number]   [name] [name] is a 66 y/o female with a history of vitamin D deficiency, obstructive sleep apnea, GERD, pneumonia, PVD, DM, depression, DJD, COPD with oxygen, allergies, past tobacco use and chronic heart failure.   Echo report on 04/04/21 reviewed and showed an EF of 60-65%, Left atrial size was moderately dilated. Echo report on 06/06/19 reviewed and showed an EF of 60-65%. Echo done on 05/21/16 and showed an EF of 50% along with trivial MR/TR/PR. EF has declined slightly from June 2017.  Has not been admitted or been in the ED in the last 6 months.   [name] [name] presents today for a follow up appointment with a chief complaint of   Past Medical History:  Diagnosis Date   Allergic rhinitis    Allergy    Asthma    Back pain    CHF (congestive heart failure) (HCC)    COPD (chronic obstructive pulmonary disease) (HCC)    Degenerative joint disease of knee, left    Depression    Diabetes mellitus without complication (HCC)    Edema    Elevated lipids    GERD (gastroesophageal reflux disease)    Hidradenitis    History of colonic polyps    Hypertension    Hypothyroidism    IBS (irritable bowel syndrome)    Insomnia    Neuropathy    feet and legs   Obesity    Oxygen dependent    Pneumonia    PONV (postoperative nausea and vomiting)    PVD (peripheral vascular disease) (HCC)    Sinusitis, chronic    Sleep apnea    Thyroid disease    Vaginitis, atrophic    Vertigo    Vitamin D deficiency    Past Surgical History:  Procedure Laterality Date   ABDOMINAL HYSTERECTOMY     AXILLARY HIDRADENITIS EXCISION     BREAST EXCISIONAL BIOPSY Bilateral 11/7351   RUPTURED FOLLICULAR CYSTS WITH ABSCESSES AND SCARRING, CONSISTENT WITH HIDRADENITIS SUPPURATIVA.    CATARACT EXTRACTION W/PHACO Right 07/27/2020   Procedure: CATARACT EXTRACTION PHACO AND INTRAOCULAR LENS PLACEMENT (IOC) RIGHT DIABETIC 17.13 01:23.9;  Surgeon:  Birder Robson, MD;  Location: [facility];  Service: Ophthalmology;  Laterality: Right;  Diabetic - insulin  sleep apnea wants to be last   CESAREAN SECTION     x 2   CHOLECYSTECTOMY     HEEL SPUR EXCISION N/A    HYDRADENITIS EXCISION Right 12/31/2015   Procedure: EXCISION HIDRADENITIS AXILLA;  Surgeon: Clayburn Pert, MD;  Location: [facility];  Service: General;  Laterality: Right;   KNEE SURGERY Left    1998   TONSILLECTOMY     Family History  Problem Relation Age of Onset   Rashes / Skin problems Father    Hypertension Father    Heart disease Father    Breast cancer Paternal Grandmother    COPD Mother    Kidney cancer Neg Hx    Bladder Cancer Neg Hx    Social History   Tobacco Use   Smoking status: Former    Packs/day: 3.00    Years: 20.00    Pack years: 60.00    Types: Cigarettes    Quit date: 12/18/1993    Years since quitting: 27.5   Smokeless tobacco: Never  Substance Use Topics   Alcohol use: No   Allergies  Allergen Reactions   Codeine Itching   Levofloxacin Rash    "  welts" & itching     Review of Systems  Constitutional:  Positive for fatigue. Negative for appetite change.  HENT:  Negative for congestion, rhinorrhea and sore throat.   Eyes: Negative.   Respiratory:  Positive for shortness of breath (with moderate exertion). Negative for cough, chest tightness and wheezing.   Cardiovascular:  Negative for chest pain, palpitations and leg swelling.  Gastrointestinal:  Negative for abdominal distention and abdominal pain.  Endocrine: Negative.   Genitourinary: Negative.   Musculoskeletal:  Positive for arthralgias (right shoulder). Negative for back pain and neck pain.  Skin: Negative.   Allergic/Immunologic: Negative.   Neurological:  Positive for light-headedness (with vertigo). Negative for dizziness.  Hematological:  Negative for adenopathy. Does not bruise/bleed easily.  Psychiatric/Behavioral:  Negative for dysphoric mood and sleep  disturbance (sleeping on 1 pillow with CPAP/ oxygen). The patient is not nervous/anxious.      Physical Exam Vitals and nursing note reviewed.  Constitutional:      General: [name] [name] is not in acute distress.    Appearance: [name] [name] is well-developed. [name] [name] is obese.  HENT:     Head: Normocephalic and atraumatic.  Neck:     Vascular: No JVD.  Cardiovascular:     Rate and Rhythm: Normal rate and regular rhythm.     Pulses: Normal pulses.  Pulmonary:     Effort: Pulmonary effort is normal.     Breath sounds: No wheezing or rales.  Abdominal:     General: There is no distension.     Palpations: Abdomen is soft.     Tenderness: There is no abdominal tenderness.  Musculoskeletal:        General: No tenderness.     Cervical back: Normal range of motion and neck supple.     Right lower leg: No tenderness. No edema.     Left lower leg: No tenderness. No edema.  Skin:    General: Skin is warm and dry.  Neurological:     Mental Status: [name] [name] is alert and oriented to person, place, and time.  Psychiatric:        Behavior: Behavior normal.        Thought Content: Thought content normal.     Assessment & Plan:  1: Chronic heart failure with preserved ejection fraction with structural changes- - NYHA class II - euvolemic today - weighing sporadically; reminded to call for an overnight weight gain of 2 lbs or a weekly weight gain of 5 lbs - weight 247 pounds from last visit here 2 months ago - not adding salt and is using a salt substitute. Using meals on wheels for food; reminded her to follow a 2000 mg sodium diet - saw cardiology ([place]) 06/23/19 - on GDMT of entresto & jardiance - current BP will not allow for entresto titration - wearing CPAP nightly  2: HTN- - BP looks good - follows with PCP at [place] from 05/12/21 reviewed and showed sodium 132, potassium 4.4, creatinine 1.16 and GFR 53  3: Diabetes- - A1C on 05/21/16 was 7.0% - saw endocrinologist  ([place]) 09/12/17   4: COPD- - continue inhalers  - wears oxygen at 2L at bedtime - saw pulmonologist Mortimer Fries) 12/17/19 - currently finishing prednisone taper   Medication list reviewed.

## 2021-07-11 ENCOUNTER — Ambulatory Visit: Payer: Medicare Other | Admitting: Family

## 2021-07-12 ENCOUNTER — Ambulatory Visit
Admission: RE | Admit: 2021-07-12 | Discharge: 2021-07-12 | Disposition: A | Discharge status: Home / Self Care | Payer: Medicare Other | Source: Ambulatory Visit | Attending: Adult Health | Admitting: Adult Health

## 2021-07-12 ENCOUNTER — Ambulatory Visit (HOSPITAL_BASED_OUTPATIENT_CLINIC_OR_DEPARTMENT_OTHER): Payer: Medicare Other | Admitting: Family

## 2021-07-12 ENCOUNTER — Encounter: Payer: Self-pay | Admitting: Adult Health

## 2021-07-12 ENCOUNTER — Encounter: Payer: Self-pay | Admitting: Family

## 2021-07-12 ENCOUNTER — Ambulatory Visit (INDEPENDENT_AMBULATORY_CARE_PROVIDER_SITE_OTHER): Payer: Medicare Other | Admitting: Adult Health

## 2021-07-12 VITALS — BP 99/50 | HR 80 | Resp 14 | Ht 63.0 in | Wt 249.4 lb

## 2021-07-12 VITALS — BP 100/56 | HR 77 | Temp 98.2°F | Ht 63.0 in | Wt 249.0 lb

## 2021-07-12 DIAGNOSIS — Z9981 Dependence on supplemental oxygen: Secondary | ICD-10-CM | POA: Insufficient documentation

## 2021-07-12 DIAGNOSIS — Z79899 Other long term (current) drug therapy: Secondary | ICD-10-CM | POA: Insufficient documentation

## 2021-07-12 DIAGNOSIS — G4733 Obstructive sleep apnea (adult) (pediatric): Secondary | ICD-10-CM

## 2021-07-12 DIAGNOSIS — R5383 Other fatigue: Secondary | ICD-10-CM | POA: Insufficient documentation

## 2021-07-12 DIAGNOSIS — Z9989 Dependence on other enabling machines and devices: Secondary | ICD-10-CM | POA: Insufficient documentation

## 2021-07-12 DIAGNOSIS — R0602 Shortness of breath: Secondary | ICD-10-CM | POA: Insufficient documentation

## 2021-07-12 DIAGNOSIS — I10 Essential (primary) hypertension: Secondary | ICD-10-CM

## 2021-07-12 DIAGNOSIS — M1990 Unspecified osteoarthritis, unspecified site: Secondary | ICD-10-CM | POA: Insufficient documentation

## 2021-07-12 DIAGNOSIS — J449 Chronic obstructive pulmonary disease, unspecified: Secondary | ICD-10-CM

## 2021-07-12 DIAGNOSIS — I110 Hypertensive heart disease with heart failure: Secondary | ICD-10-CM | POA: Insufficient documentation

## 2021-07-12 DIAGNOSIS — J189 Pneumonia, unspecified organism: Secondary | ICD-10-CM | POA: Insufficient documentation

## 2021-07-12 DIAGNOSIS — F32A Depression, unspecified: Secondary | ICD-10-CM | POA: Insufficient documentation

## 2021-07-12 DIAGNOSIS — K219 Gastro-esophageal reflux disease without esophagitis: Secondary | ICD-10-CM | POA: Insufficient documentation

## 2021-07-12 DIAGNOSIS — E119 Type 2 diabetes mellitus without complications: Secondary | ICD-10-CM | POA: Insufficient documentation

## 2021-07-12 DIAGNOSIS — Z7984 Long term (current) use of oral hypoglycemic drugs: Secondary | ICD-10-CM | POA: Insufficient documentation

## 2021-07-12 DIAGNOSIS — J441 Chronic obstructive pulmonary disease with (acute) exacerbation: Secondary | ICD-10-CM | POA: Diagnosis not present

## 2021-07-12 DIAGNOSIS — I5032 Chronic diastolic (congestive) heart failure: Secondary | ICD-10-CM

## 2021-07-12 DIAGNOSIS — Z87891 Personal history of nicotine dependence: Secondary | ICD-10-CM | POA: Insufficient documentation

## 2021-07-12 DIAGNOSIS — Z794 Long term (current) use of insulin: Secondary | ICD-10-CM

## 2021-07-12 DIAGNOSIS — R42 Dizziness and giddiness: Secondary | ICD-10-CM | POA: Insufficient documentation

## 2021-07-12 NOTE — Patient Instructions (Signed)
Continue on Symbicort 2 puffs twice daily, rinse after use ?Continue on Singulair daily ?Continue on Daliresp daily ?Albuterol inhaler or nebulizer as needed ?Activity as tolerated ? ?Continue on CPAP at bedtime. ?Keep up the good work ?CPAP care as discussed ?Healthy sleep regimen ?Do not drive if sleepy ?Healthy weight loss ? ?Chest xray today  ? ?Follow-up with Dr. Mortimer Fries in 6 months and As needed   ? ?

## 2021-07-12 NOTE — Progress Notes (Signed)
? Patient ID: [name] [name], female    DOB: May 08, 1955, 66 y.o.   MRN: [id number] ? ? ?Ms Sandeen is a 66 y/o female with a history of vitamin D deficiency, obstructive sleep apnea, GERD, pneumonia, PVD, DM, depression, DJD, COPD with oxygen, allergies, past tobacco use and chronic heart failure.  ? ?Echo report on 04/04/21 reviewed and showed an EF of 60-65%, Left atrial size was moderately dilated. Echo report on 06/06/19 reviewed and showed an EF of 60-65%. Echo done on 05/21/16 and showed an EF of 50% along with trivial MR/TR/PR. EF has declined slightly from June 2017. ? ?Has not been admitted or been in the ED in the last 6 months.  ? ?She presents today for a follow up appointment with a chief complaint of minimal shortness of breath with moderate exertion. She describes this as chronic in nature. She has associated fatigue, light-headedness and tailbone pain (no injury noted) along with this. She denies any difficulty sleeping, abdominal distention, palpitations, pedal edema, chest pain, wheezing, cough or weight gain.  ? ?Says that she has a treadmill at home but doesn't use it. Also admits that she doesn't consistently use her inhalers and understands that her breathing could be better if she used them.  ? ?Past Medical History:  ?Diagnosis Date  ? Allergic rhinitis   ? Allergy   ? Asthma   ? Back pain   ? CHF (congestive heart failure) ([place])   ? COPD (chronic obstructive pulmonary disease) ([place])   ? Degenerative joint disease of knee, left   ? Depression   ? Diabetes mellitus without complication ([place])   ? Edema   ? Elevated lipids   ? GERD (gastroesophageal reflux disease)   ? Hidradenitis   ? History of colonic polyps   ? Hypertension   ? Hypothyroidism   ? IBS (irritable bowel syndrome)   ? Insomnia   ? Neuropathy   ? feet and legs  ? Obesity   ? Oxygen dependent   ? Pneumonia   ? PONV (postoperative nausea and vomiting)   ? PVD (peripheral vascular disease) ([place])   ? Sinusitis, chronic   ? Sleep apnea   ?  Thyroid disease   ? Vaginitis, atrophic   ? Vertigo   ? Vitamin D deficiency   ? ?Past Surgical History:  ?Procedure Laterality Date  ? ABDOMINAL HYSTERECTOMY    ? AXILLARY HIDRADENITIS EXCISION    ? BREAST EXCISIONAL BIOPSY Bilateral 12/2015  ? RUPTURED FOLLICULAR CYSTS WITH ABSCESSES AND SCARRING, CONSISTENT WITH HIDRADENITIS SUPPURATIVA.   ? CATARACT EXTRACTION W/PHACO Right 07/27/2020  ? Procedure: CATARACT EXTRACTION PHACO AND INTRAOCULAR LENS PLACEMENT (IOC) RIGHT DIABETIC 17.13 01:23.9;  Surgeon: Birder Robson, MD;  Location: [facility];  Service: Ophthalmology;  Laterality: Right;  Diabetic - insulin  ?sleep apnea ?wants to be last  ? CESAREAN SECTION    ? x 2  ? CHOLECYSTECTOMY    ? HEEL SPUR EXCISION N/A   ? HYDRADENITIS EXCISION Right 12/31/2015  ? Procedure: EXCISION HIDRADENITIS AXILLA;  Surgeon: Clayburn Pert, MD;  Location: [facility];  Service: General;  Laterality: Right;  ? KNEE SURGERY Left   ? 1998  ? TONSILLECTOMY    ? ?Family History  ?Problem Relation Age of Onset  ? Rashes / Skin problems Father   ? Hypertension Father   ? Heart disease Father   ? Breast cancer Paternal Grandmother   ? COPD Mother   ? Kidney cancer Neg Hx   ? Bladder  Cancer Neg Hx   ? ?Social History  ? ?Tobacco Use  ? Smoking status: Former  ?  Packs/day: 3.00  ?  Years: 20.00  ?  Pack years: 60.00  ?  Types: Cigarettes  ?  Quit date: 12/18/1993  ?  Years since quitting: 27.5  ? Smokeless tobacco: Never  ?Substance Use Topics  ? Alcohol use: No  ? ?Allergies  ?Allergen Reactions  ? Codeine Itching  ? Levofloxacin Rash  ?  "welts" & itching  ? ?Prior to Admission medications   ?Medication Sig Start Date End Date Taking? Authorizing Provider  ?acetaminophen (TYLENOL) 500 MG tablet Take 500 mg by mouth every 6 (six) hours as needed.   Yes [provider]  ?albuterol (PROVENTIL HFA;VENTOLIN HFA) 108 (90 Base) MCG/ACT inhaler Inhale 2 puffs into the lungs every 6 (six) hours as needed for wheezing or shortness  of breath.   Yes [provider]  ?albuterol (PROVENTIL) (2.5 MG/3ML) 0.083% nebulizer solution Take 2.5 mg by nebulization every 6 (six) hours as needed for wheezing or shortness of breath.    Yes [provider]  ?aspirin EC 81 MG tablet Take 81 mg by mouth daily.   Yes [provider]  ?atorvastatin (LIPITOR) 20 MG tablet Take 20 mg by mouth at bedtime.    Yes [provider]  ?budesonide-formoterol (SYMBICORT) 160-4.5 MCG/ACT inhaler Inhale 2 puffs into the lungs 2 (two) times daily.   Yes [provider]  ?cetirizine (ZYRTEC) 10 MG tablet Take 10 mg by mouth daily.   Yes [provider]  ?cyclobenzaprine (FLEXERIL) 10 MG tablet Take 10 mg by mouth at bedtime.   Yes [provider]  ?doxepin (SINEQUAN) 50 MG capsule Take 50 mg by mouth at bedtime.   Yes [provider]  ?DULoxetine (CYMBALTA) 20 MG capsule Take 40 mg by mouth daily.   Yes [provider]  ?empagliflozin (JARDIANCE) 10 MG TABS tablet Take 25 mg by mouth daily.   Yes [provider]  ?ENTRESTO 24-26 MG TAKE ONE (1) TABLET BY MOUTH TWO TIMES PER DAY 07/02/21  Yes Alisa Graff, FNP  ?fluticasone (FLONASE) 50 MCG/ACT nasal spray  06/24/18  Yes [provider]  ?gabapentin (NEURONTIN) 300 MG capsule Take 600 mg by mouth 2 (two) times daily. 03/25/17  Yes [provider]  ?glucose blood test strip TEST three times a day 12/30/15  Yes [provider]  ?HUMULIN R U-500 KWIKPEN 500 UNIT/ML injection Inject 115 Units into the skin 2 (two) times daily with a meal.   Yes [provider]  ?ibuprofen (ADVIL,MOTRIN) 800 MG tablet Take 1 tablet (800 mg total) by mouth every 8 (eight) hours as needed for mild pain or moderate pain (with food). 12/21/16  Yes Eula Listen, MD  ?ipratropium (ATROVENT HFA) 17 MCG/ACT inhaler Inhale 2 puffs into the lungs every 6 (six) hours as needed for wheezing. Patient uses once a day   Yes [provider]  ?Lancets Misc. (ACCU-CHEK FASTCLIX LANCET) KIT  12/27/15  Yes [provider]  ?levocetirizine (XYZAL) 5 MG tablet TAKE 1 TABLET BY MOUTH EVERY EVENING 04/28/21  Yes Moye, [place], MD  ?levothyroxine (SYNTHROID, LEVOTHROID) 200 MCG tablet Take 175 mcg by mouth daily before breakfast.   Yes [provider]  ?meclizine (ANTIVERT) 25 MG tablet Take 25 mg by mouth 3 (three) times daily as needed for dizziness.   Yes [provider]  ?Melatonin 5 MG CAPS Take 2 capsules by mouth  daily.   Yes [provider]  ?mometasone (NASONEX) 50 MCG/ACT nasal spray Place 2 sprays into both nostrils daily.    Yes [provider]  ?montelukast (SINGULAIR) 10 MG tablet Take 10 mg by mouth at bedtime.    Yes [provider]  ?olopatadine (PATANOL) 0.1 % ophthalmic solution Place 1 drop into both eyes 2 (two) times daily.   Yes [provider]  ?omeprazole (PRILOSEC) 40 MG capsule Take 40 mg by mouth daily.   Yes [provider]  ?OXYGEN Inhale into the lungs. With CPAP   Yes [provider]  ?potassium citrate (UROCIT-K) 10 MEQ (1080 MG) SR tablet Take 1 tablet (10 mEq total) by mouth every morning. 01/17/21  Yes Alisa Graff, FNP  ?roflumilast (DALIRESP) 500 MCG TABS tablet Take 500 mcg by mouth daily.   Yes [provider]  ?Semaglutide,0.25 or 0.5MG/DOS, (OZEMPIC, 0.25 OR 0.5 MG/DOSE,) 2 MG/1.5ML SOPN Inject into the skin once a week.   Yes [provider]  ?spironolactone (ALDACTONE) 50 MG tablet Take 100 mg by mouth daily.    Yes [provider]  ?torsemide (DEMADEX) 20 MG tablet TAKE  1 TABLETS BY MOUTH daily 01/17/21  Yes Darylene Price A, FNP  ?traZODone (DESYREL) 50 MG tablet Take 50-100 mg by mouth at bedtime as needed for sleep.    Yes [provider]  ?ketoconazole (NIZORAL) 2 % cream Apply twice daily for 2 weeks then as needed for rash to areas at lower abdomen. 11/13/19   Moye, [place], MD   ?oxyCODONE-acetaminophen (PERCOCET/ROXICET) 5-325 MG tablet Take 1 tablet by mouth every 6 (six) hours as needed for severe pain. ?Patient not taking: Reported on 07/12/2021 09/29/19   Cuthriell, Roderic Palau

## 2021-07-12 NOTE — Assessment & Plan Note (Signed)
Obstructive sleep apnea continue on CPAP at bedtime.  She has excellent control compliance on CPAP.  No changes ? ?Plan  ?Patient Instructions  ?Continue on Symbicort 2 puffs twice daily, rinse after use ?Continue on Singulair daily ?Continue on Daliresp daily ?Albuterol inhaler or nebulizer as needed ?Activity as tolerated ? ?Continue on CPAP at bedtime. ?Keep up the good work ?CPAP care as discussed ?Healthy sleep regimen ?Do not drive if sleepy ?Healthy weight loss ? ?Chest xray today  ? ?Follow-up with Dr. Mortimer Fries in 6 months and As needed   ? ?  ? ?

## 2021-07-12 NOTE — Progress Notes (Signed)
? ?@Patient  ID: [name] [name], female    DOB: 08-26-1955, 66 y.o.   MRN: [id number] ? ?Chief Complaint  ?Patient presents with  ? Follow-up  ? ? ?Referring provider: ?Denton Lank, MD ? ?HPI: ?66 year old female former smoker followed for COPD chronic allergies and sleep apnea on CPAP with oxygen ? ?TEST/EVENTS :  ? ?07/12/2021 Follow up : COPD , OSA . O2 RF  ?Patient presents for a follow-up visit.  She was last seen September 2021.  She has underlying COPD, obstructive sleep apnea and is on CPAP with oxygen at bedtime ?She remains on Symbicort twice daily, Singulair and Daliresp  daily . Has albuterol inhaler and nebulizer.  No pulmonary function testing on file. ?Complains of cough, congestion, nasal drainage for around 3 months during recent winter months, seen by PCP . treated for pneumonia with antibiotics and steroids .  Chest x-ray on June 02, 2021 showed reticular opacities in the lungs potential for possible atypical infection ?[name] Finally feeling better .  Says she feels like she is back to baseline.  Patient says she does some light activities but is limited due to her breathing and joint issues. ?Spirometry today shows moderate restriction with an FEV1 at 66%, ratio 82, FVC 62%, ? ?Patient has underlying sleep apnea.  Is on CPAP at bedtime.  Patient says she wears her CPAP every single night cannot sleep without it.  Feels that she benefits from CPAP.  CPAP download shows excellent compliance with 97% usage.  Daily average usage at 7 hours.  Patient is on auto CPAP 8 to 18 cm H2O.  AHI 2.4/hour.  Patient does have some mask leak but says that it is tolerable. ?She does wear oxygen 2 L with her CPAP. ? ? ? ?Allergies  ?Allergen Reactions  ? Codeine Itching  ? Levofloxacin Rash  ?  "welts" & itching  ? ? ?Immunization History  ?Administered Date(s) Administered  ? Influenza,inj,Quad PF,6+ Mos 01/20/2019  ? PFIZER(Purple Top)SARS-COV-2 Vaccination 07/04/2019, 07/25/2019  ? Pneumococcal Polysaccharide-23  05/24/2016  ? Td 09/15/2014  ? ? ?Past Medical History:  ?Diagnosis Date  ? Allergic rhinitis   ? Allergy   ? Asthma   ? Back pain   ? CHF (congestive heart failure) ([place])   ? COPD (chronic obstructive pulmonary disease) ([place])   ? Degenerative joint disease of knee, left   ? Depression   ? Diabetes mellitus without complication ([place])   ? Edema   ? Elevated lipids   ? GERD (gastroesophageal reflux disease)   ? Hidradenitis   ? History of colonic polyps   ? Hypertension   ? Hypothyroidism   ? IBS (irritable bowel syndrome)   ? Insomnia   ? Neuropathy   ? feet and legs  ? Obesity   ? Oxygen dependent   ? Pneumonia   ? PONV (postoperative nausea and vomiting)   ? PVD (peripheral vascular disease) ([place])   ? Sinusitis, chronic   ? Sleep apnea   ? Thyroid disease   ? Vaginitis, atrophic   ? Vertigo   ? Vitamin D deficiency   ? ? ?Tobacco History: ?Social History  ? ?Tobacco Use  ?Smoking Status Former  ? Packs/day: 3.00  ? Years: 20.00  ? Pack years: 60.00  ? Types: Cigarettes  ? Quit date: 12/18/1993  ? Years since quitting: 27.5  ?Smokeless Tobacco Never  ? ?Counseling given: Not Answered ? ? ?Outpatient Medications Prior to Visit  ?Medication Sig Dispense Refill  ? acetaminophen (TYLENOL)  500 MG tablet Take 500 mg by mouth every 6 (six) hours as needed.    ? albuterol (PROVENTIL HFA;VENTOLIN HFA) 108 (90 Base) MCG/ACT inhaler Inhale 2 puffs into the lungs every 6 (six) hours as needed for wheezing or shortness of breath.    ? albuterol (PROVENTIL) (2.5 MG/3ML) 0.083% nebulizer solution Take 2.5 mg by nebulization every 6 (six) hours as needed for wheezing or shortness of breath.     ? aspirin EC 81 MG tablet Take 81 mg by mouth daily.    ? atorvastatin (LIPITOR) 20 MG tablet Take 20 mg by mouth at bedtime.     ? budesonide-formoterol (SYMBICORT) 160-4.5 MCG/ACT inhaler Inhale 2 puffs into the lungs 2 (two) times daily.    ? cetirizine (ZYRTEC) 10 MG tablet Take 10 mg by mouth daily.    ? cyclobenzaprine (FLEXERIL) 10 MG  tablet Take 10 mg by mouth at bedtime.    ? doxepin (SINEQUAN) 50 MG capsule Take 50 mg by mouth at bedtime.    ? DULoxetine (CYMBALTA) 20 MG capsule Take 40 mg by mouth daily.    ? empagliflozin (JARDIANCE) 10 MG TABS tablet Take 25 mg by mouth daily.    ? ENTRESTO 24-26 MG TAKE ONE (1) TABLET BY MOUTH TWO TIMES PER DAY 60 tablet 5  ? fluticasone (FLONASE) 50 MCG/ACT nasal spray     ? gabapentin (NEURONTIN) 300 MG capsule Take 600 mg by mouth 2 (two) times daily.  0  ? glucose blood test strip TEST three times a day    ? HUMULIN R U-500 KWIKPEN 500 UNIT/ML injection Inject 115 Units into the skin 2 (two) times daily with a meal.  1  ? ibuprofen (ADVIL,MOTRIN) 800 MG tablet Take 1 tablet (800 mg total) by mouth every 8 (eight) hours as needed for mild pain or moderate pain (with food). 20 tablet 0  ? ipratropium (ATROVENT HFA) 17 MCG/ACT inhaler Inhale 2 puffs into the lungs every 6 (six) hours as needed for wheezing. Patient uses once a day    ? ketoconazole (NIZORAL) 2 % cream Apply twice daily for 2 weeks then as needed for rash to areas at lower abdomen. 30 g 2  ? Lancets Misc. (ACCU-CHEK FASTCLIX LANCET) KIT     ? levocetirizine (XYZAL) 5 MG tablet TAKE 1 TABLET BY MOUTH EVERY EVENING 30 tablet 5  ? levothyroxine (SYNTHROID, LEVOTHROID) 200 MCG tablet Take 175 mcg by mouth daily before breakfast.  1  ? meclizine (ANTIVERT) 25 MG tablet Take 25 mg by mouth 3 (three) times daily as needed for dizziness.    ? Melatonin 5 MG CAPS Take 2 capsules by mouth daily.    ? mometasone (NASONEX) 50 MCG/ACT nasal spray Place 2 sprays into both nostrils daily.     ? montelukast (SINGULAIR) 10 MG tablet Take 10 mg by mouth at bedtime.   1  ? olopatadine (PATANOL) 0.1 % ophthalmic solution Place 1 drop into both eyes 2 (two) times daily.    ? omeprazole (PRILOSEC) 40 MG capsule Take 40 mg by mouth daily.    ? OXYGEN Inhale into the lungs. With CPAP    ? potassium citrate (UROCIT-K) 10 MEQ (1080 MG) SR tablet Take 1 tablet (10  mEq total) by mouth every morning. 90 tablet 3  ? roflumilast (DALIRESP) 500 MCG TABS tablet Take 500 mcg by mouth daily.    ? Semaglutide,0.25 or 0.5MG/DOS, (OZEMPIC, 0.25 OR 0.5 MG/DOSE,) 2 MG/1.5ML SOPN Inject into the skin once a  week.    ? spironolactone (ALDACTONE) 50 MG tablet Take 100 mg by mouth daily.     ? torsemide (DEMADEX) 20 MG tablet TAKE  1 TABLETS BY MOUTH daily 90 tablet 3  ? traZODone (DESYREL) 50 MG tablet Take 50-100 mg by mouth at bedtime as needed for sleep.     ? oxyCODONE-acetaminophen (PERCOCET/ROXICET) 5-325 MG tablet Take 1 tablet by mouth every 6 (six) hours as needed for severe pain. (Patient not taking: Reported on 07/12/2021) 12 tablet 0  ? ?No facility-administered medications prior to visit.  ? ? ? ?Review of Systems:  ? ?Constitutional:   No  weight loss, night sweats,  Fevers, chills, +fatigue, or  lassitude. ? ?HEENT:   No headaches,  Difficulty swallowing,  Tooth/dental problems, or  Sore throat,  ?              No sneezing, itching, ear ache, ?+ nasal congestion, post nasal drip,  ? ?CV:  No chest pain,  Orthopnea, PND, swelling in lower extremities, anasarca, dizziness, palpitations, syncope.  ? ?GI  No heartburn, indigestion, abdominal pain, nausea, vomiting, diarrhea, change in bowel habits, loss of appetite, bloody stools.  ? ?Resp: .  No chest wall deformity ? ?Skin: no rash or lesions. ? ?GU: no dysuria, change in color of urine, no urgency or frequency.  No flank pain, no hematuria  ? ?MS:  No joint pain or swelling.  No decreased range of motion.  No back pain. ? ? ? ?Physical Exam ? ?BP (!) 100/56 (BP Location: Left Arm, Cuff Size: Normal)   Pulse 77   Temp 98.2 ?F (36.8 ?C) (Temporal)   Ht 5' 3"  (1.6 m)   Wt 249 lb (112.9 kg)   SpO2 94%   BMI 44.11 kg/m?  ? ?GEN: A/Ox3; pleasant , NAD, in wheelchair  ?  ?HEENT:  [place]/AT,  NOSE-clear, THROAT-clear, no lesions, no postnasal drip or exudate noted.  ? ?NECK:  Supple w/ fair ROM; no JVD; normal carotid impulses w/o  bruits; no thyromegaly or nodules palpated; no lymphadenopathy.   ? ?RESP  Clear  P & A; w/o, wheezes/ rales/ or rhonchi. no accessory muscle use, no dullness to percussion ? ?CARD:  RRR, no m/r/g, tr-1+  peripheral

## 2021-07-12 NOTE — Assessment & Plan Note (Signed)
COPD with recent slow to resolve exacerbation with associated pneumonia.  Clinically patient appears to be improved and back to baseline.  Spirometry today shows some moderate restriction.  No significant airflow obstruction.  May have also component of asthma.  Also restrictive changes with morbid obesity BMI is at 44. ?We will continue on Symbicort twice daily.  Continue on trigger prevention.  Maintain Singulair and Daliresp at this time. ?Follow-up chest x-ray for resolution of pneumonia ?Plan  ?Patient Instructions  ?Continue on Symbicort 2 puffs twice daily, rinse after use ?Continue on Singulair daily ?Continue on Daliresp daily ?Albuterol inhaler or nebulizer as needed ?Activity as tolerated ? ?Continue on CPAP at bedtime. ?Keep up the good work ?CPAP care as discussed ?Healthy sleep regimen ?Do not drive if sleepy ?Healthy weight loss ? ?Chest xray today  ? ?Follow-up with Dr. Mortimer Fries in 6 months and As needed   ? ?  ? ?

## 2021-07-12 NOTE — Assessment & Plan Note (Signed)
Clinically improved with antibiotics.  Continue on current regimen.  Chest x-ray today for clearance ?

## 2021-07-12 NOTE — Patient Instructions (Addendum)
Continue weighing daily and call for an overnight weight gain of 3 pounds or more or a weekly weight gain of more than 5 pounds. ? ? ?If you have voicemail, please make sure your mailbox is cleaned out so that we may leave a message and please make sure to listen to any voicemails.  ? ? ?Start using your treadmill every morning for a few minutes at a time.  ?

## 2021-07-12 NOTE — Assessment & Plan Note (Signed)
Appears compensated.  Continue on current regimen. 

## 2021-07-15 NOTE — Progress Notes (Signed)
Called and spoke with patient, provided results/recommendations per Rexene Edison  NP.  Nothing further needed.

## 2021-08-16 ENCOUNTER — Other Ambulatory Visit: Payer: Self-pay

## 2021-08-16 DIAGNOSIS — Z86010 Personal history of colonic polyps: Secondary | ICD-10-CM

## 2021-08-16 MED ORDER — NA SULFATE-K SULFATE-MG SULF 17.5-3.13-1.6 GM/177ML PO SOLN: 1.0000 | Freq: Once | ORAL | 0 refills | Status: AC

## 2021-08-16 NOTE — Progress Notes (Signed)
Gastroenterology Pre-Procedure Review  Request Date: 09/15/2021 Requesting Physician: Dr. Marius Ditch  PATIENT REVIEW QUESTIONS: The patient responded to the following health history questions as indicated:    1. Are you having any GI issues? no 2. Do you have a personal history of Polyps? yes (LAST COLONOSCOPY ) 3. Do you have a family history of Colon Cancer or Polyps? no 4. Diabetes Mellitus? yes (TYPE 2) 5. Joint replacements in the past 12 months?no 6. Major health problems in the past 3 months?yes (COPD) 7. Any artificial heart valves, MVP, or defibrillator?no    MEDICATIONS & ALLERGIES:    Patient reports the following regarding taking any anticoagulation/antiplatelet therapy:   Plavix, Coumadin, Eliquis, Xarelto, Lovenox, Pradaxa, Brilinta, or Effient? no Aspirin? yes (81 MG)  Patient confirms/reports the following medications:  Current Outpatient Medications  Medication Sig Dispense Refill   acetaminophen (TYLENOL) 500 MG tablet Take 500 mg by mouth every 6 (six) hours as needed.     albuterol (PROVENTIL HFA;VENTOLIN HFA) 108 (90 Base) MCG/ACT inhaler Inhale 2 puffs into the lungs every 6 (six) hours as needed for wheezing or shortness of breath.     albuterol (PROVENTIL) (2.5 MG/3ML) 0.083% nebulizer solution Take 2.5 mg by nebulization every 6 (six) hours as needed for wheezing or shortness of breath.      aspirin EC 81 MG tablet Take 81 mg by mouth daily.     atorvastatin (LIPITOR) 20 MG tablet Take 20 mg by mouth at bedtime.      budesonide-formoterol (SYMBICORT) 160-4.5 MCG/ACT inhaler Inhale 2 puffs into the lungs 2 (two) times daily.     cetirizine (ZYRTEC) 10 MG tablet Take 10 mg by mouth daily.     cyclobenzaprine (FLEXERIL) 10 MG tablet Take 10 mg by mouth at bedtime.     doxepin (SINEQUAN) 50 MG capsule Take 50 mg by mouth at bedtime.     DULoxetine (CYMBALTA) 20 MG capsule Take 40 mg by mouth daily.     empagliflozin (JARDIANCE) 10 MG TABS tablet Take 25 mg by mouth  daily.     ENTRESTO 24-26 MG TAKE ONE (1) TABLET BY MOUTH TWO TIMES PER DAY 60 tablet 5   fluticasone (FLONASE) 50 MCG/ACT nasal spray      gabapentin (NEURONTIN) 300 MG capsule Take 600 mg by mouth 2 (two) times daily.  0   glucose blood test strip TEST three times a day     HUMULIN R U-500 KWIKPEN 500 UNIT/ML injection Inject 115 Units into the skin 2 (two) times daily with a meal.  1   ibuprofen (ADVIL,MOTRIN) 800 MG tablet Take 1 tablet (800 mg total) by mouth every 8 (eight) hours as needed for mild pain or moderate pain (with food). 20 tablet 0   ipratropium (ATROVENT HFA) 17 MCG/ACT inhaler Inhale 2 puffs into the lungs every 6 (six) hours as needed for wheezing. Patient uses once a day     ketoconazole (NIZORAL) 2 % cream Apply twice daily for 2 weeks then as needed for rash to areas at lower abdomen. 30 g 2   Lancets Misc. (ACCU-CHEK FASTCLIX LANCET) KIT      levocetirizine (XYZAL) 5 MG tablet TAKE 1 TABLET BY MOUTH EVERY EVENING 30 tablet 5   levothyroxine (SYNTHROID, LEVOTHROID) 200 MCG tablet Take 175 mcg by mouth daily before breakfast.  1   meclizine (ANTIVERT) 25 MG tablet Take 25 mg by mouth 3 (three) times daily as needed for dizziness.     Melatonin 5 MG CAPS Take  2 capsules by mouth daily.     mometasone (NASONEX) 50 MCG/ACT nasal spray Place 2 sprays into both nostrils daily.      montelukast (SINGULAIR) 10 MG tablet Take 10 mg by mouth at bedtime.   1   olopatadine (PATANOL) 0.1 % ophthalmic solution Place 1 drop into both eyes 2 (two) times daily.     omeprazole (PRILOSEC) 40 MG capsule Take 40 mg by mouth daily.     oxyCODONE-acetaminophen (PERCOCET/ROXICET) 5-325 MG tablet Take 1 tablet by mouth every 6 (six) hours as needed for severe pain. (Patient not taking: Reported on 07/12/2021) 12 tablet 0   OXYGEN Inhale into the lungs. With CPAP     potassium citrate (UROCIT-K) 10 MEQ (1080 MG) SR tablet Take 1 tablet (10 mEq total) by mouth every morning. 90 tablet 3    roflumilast (DALIRESP) 500 MCG TABS tablet Take 500 mcg by mouth daily.     Semaglutide,0.25 or 0.5MG/DOS, (OZEMPIC, 0.25 OR 0.5 MG/DOSE,) 2 MG/1.5ML SOPN Inject into the skin once a week.     spironolactone (ALDACTONE) 50 MG tablet Take 100 mg by mouth daily.      torsemide (DEMADEX) 20 MG tablet TAKE  1 TABLETS BY MOUTH daily 90 tablet 3   traZODone (DESYREL) 50 MG tablet Take 50-100 mg by mouth at bedtime as needed for sleep.      No current facility-administered medications for this visit.    Patient confirms/reports the following allergies:  Allergies  Allergen Reactions   Codeine Itching   Levofloxacin Rash    "welts" & itching    No orders of the defined types were placed in this encounter.   AUTHORIZATION INFORMATION Primary Insurance: 1D#: Group #:  Secondary Insurance: 1D#: Group #:  SCHEDULE INFORMATION: Date: 09/15/2021 Time: Location:[facility]

## 2021-09-02 ENCOUNTER — Ambulatory Visit: Payer: Medicare Other | Admitting: Cardiology

## 2021-09-12 ENCOUNTER — Telehealth: Payer: Self-pay

## 2021-09-12 NOTE — Telephone Encounter (Signed)
Printed new instructions and left them up front for patient top pick up Called no answer left voicemail

## 2021-09-13 ENCOUNTER — Other Ambulatory Visit: Payer: Self-pay

## 2021-09-13 MED ORDER — NA SULFATE-K SULFATE-MG SULF 17.5-3.13-1.6 GM/177ML PO SOLN: 1.0000 | Freq: Once | ORAL | 0 refills | Status: AC

## 2021-09-14 ENCOUNTER — Encounter: Payer: Self-pay | Admitting: Gastroenterology

## 2021-09-15 ENCOUNTER — Ambulatory Visit: Payer: Medicare Other | Admitting: Anesthesiology

## 2021-09-15 ENCOUNTER — Encounter
Admission: RE | Disposition: A | Discharge status: Home / Self Care | Payer: Self-pay | Source: Home / Self Care | Attending: Gastroenterology

## 2021-09-15 ENCOUNTER — Ambulatory Visit
Admission: RE | Admit: 2021-09-15 | Discharge: 2021-09-15 | Disposition: A | Discharge status: Home / Self Care | Payer: Medicare Other | Attending: Gastroenterology | Admitting: Gastroenterology

## 2021-09-15 ENCOUNTER — Encounter: Payer: Self-pay | Admitting: Gastroenterology

## 2021-09-15 DIAGNOSIS — D124 Benign neoplasm of descending colon: Secondary | ICD-10-CM | POA: Insufficient documentation

## 2021-09-15 DIAGNOSIS — K644 Residual hemorrhoidal skin tags: Secondary | ICD-10-CM | POA: Insufficient documentation

## 2021-09-15 DIAGNOSIS — Z9981 Dependence on supplemental oxygen: Secondary | ICD-10-CM | POA: Diagnosis not present

## 2021-09-15 DIAGNOSIS — Z794 Long term (current) use of insulin: Secondary | ICD-10-CM | POA: Diagnosis not present

## 2021-09-15 DIAGNOSIS — Z7984 Long term (current) use of oral hypoglycemic drugs: Secondary | ICD-10-CM | POA: Diagnosis not present

## 2021-09-15 DIAGNOSIS — E039 Hypothyroidism, unspecified: Secondary | ICD-10-CM | POA: Insufficient documentation

## 2021-09-15 DIAGNOSIS — E1151 Type 2 diabetes mellitus with diabetic peripheral angiopathy without gangrene: Secondary | ICD-10-CM | POA: Insufficient documentation

## 2021-09-15 DIAGNOSIS — K5730 Diverticulosis of large intestine without perforation or abscess without bleeding: Secondary | ICD-10-CM | POA: Diagnosis not present

## 2021-09-15 DIAGNOSIS — R519 Headache, unspecified: Secondary | ICD-10-CM | POA: Diagnosis not present

## 2021-09-15 DIAGNOSIS — I209 Angina pectoris, unspecified: Secondary | ICD-10-CM | POA: Diagnosis not present

## 2021-09-15 DIAGNOSIS — D649 Anemia, unspecified: Secondary | ICD-10-CM | POA: Insufficient documentation

## 2021-09-15 DIAGNOSIS — G4730 Sleep apnea, unspecified: Secondary | ICD-10-CM | POA: Insufficient documentation

## 2021-09-15 DIAGNOSIS — E1140 Type 2 diabetes mellitus with diabetic neuropathy, unspecified: Secondary | ICD-10-CM | POA: Diagnosis not present

## 2021-09-15 DIAGNOSIS — I509 Heart failure, unspecified: Secondary | ICD-10-CM | POA: Insufficient documentation

## 2021-09-15 DIAGNOSIS — D123 Benign neoplasm of transverse colon: Secondary | ICD-10-CM | POA: Diagnosis not present

## 2021-09-15 DIAGNOSIS — Z1211 Encounter for screening for malignant neoplasm of colon: Secondary | ICD-10-CM | POA: Insufficient documentation

## 2021-09-15 DIAGNOSIS — M1990 Unspecified osteoarthritis, unspecified site: Secondary | ICD-10-CM | POA: Insufficient documentation

## 2021-09-15 DIAGNOSIS — J449 Chronic obstructive pulmonary disease, unspecified: Secondary | ICD-10-CM | POA: Diagnosis not present

## 2021-09-15 DIAGNOSIS — Z6839 Body mass index (BMI) 39.0-39.9, adult: Secondary | ICD-10-CM | POA: Diagnosis not present

## 2021-09-15 DIAGNOSIS — I110 Hypertensive heart disease with heart failure: Secondary | ICD-10-CM | POA: Insufficient documentation

## 2021-09-15 DIAGNOSIS — Z87891 Personal history of nicotine dependence: Secondary | ICD-10-CM | POA: Insufficient documentation

## 2021-09-15 DIAGNOSIS — F32A Depression, unspecified: Secondary | ICD-10-CM | POA: Insufficient documentation

## 2021-09-15 DIAGNOSIS — D120 Benign neoplasm of cecum: Secondary | ICD-10-CM | POA: Insufficient documentation

## 2021-09-15 DIAGNOSIS — Z86010 Personal history of colonic polyps: Secondary | ICD-10-CM | POA: Insufficient documentation

## 2021-09-15 DIAGNOSIS — Z7985 Long-term (current) use of injectable non-insulin antidiabetic drugs: Secondary | ICD-10-CM | POA: Insufficient documentation

## 2021-09-15 HISTORY — PX: COLONOSCOPY WITH PROPOFOL: SHX5780

## 2021-09-15 LAB — GLUCOSE, CAPILLARY
Glucose-Capillary: 214 mg/dL — ABNORMAL HIGH (ref 70–99)
Glucose-Capillary: 230 mg/dL — ABNORMAL HIGH (ref 70–99)

## 2021-09-15 SURGERY — COLONOSCOPY WITH PROPOFOL
Anesthesia: General

## 2021-09-15 MED ORDER — SODIUM CHLORIDE 0.9 % IV SOLN: INTRAVENOUS | Status: DC

## 2021-09-15 MED ORDER — ACETAMINOPHEN 500 MG PO TABS
ORAL_TABLET | ORAL | Status: AC
  Administered 2021-09-15: 1000 mg
  Filled 2021-09-15: qty 2

## 2021-09-15 MED ORDER — GLYCOPYRROLATE 0.2 MG/ML IJ SOLN
INTRAMUSCULAR | Status: DC | PRN
  Administered 2021-09-15: .2 mg via INTRAVENOUS

## 2021-09-15 MED ORDER — SUCCINYLCHOLINE CHLORIDE 200 MG/10ML IV SOSY
PREFILLED_SYRINGE | INTRAVENOUS | Status: DC | PRN
  Administered 2021-09-15: 120 mg via INTRAVENOUS

## 2021-09-15 MED ORDER — LIDOCAINE HCL (CARDIAC) PF 100 MG/5ML IV SOSY
PREFILLED_SYRINGE | INTRAVENOUS | Status: DC | PRN
  Administered 2021-09-15: 100 mg via INTRAVENOUS

## 2021-09-15 MED ORDER — PROPOFOL 10 MG/ML IV BOLUS
INTRAVENOUS | Status: DC | PRN
  Administered 2021-09-15: 70 mg via INTRAVENOUS

## 2021-09-15 NOTE — Anesthesia Postprocedure Evaluation (Signed)
Anesthesia Post Note  Patient: [name] [name]  Procedure(s) Performed: COLONOSCOPY WITH PROPOFOL  Patient location during evaluation: Endoscopy Anesthesia Type: General Level of consciousness: awake and alert Pain management: pain level controlled Vital Signs Assessment: post-procedure vital signs reviewed and stable Respiratory status: spontaneous breathing, nonlabored ventilation, respiratory function stable and patient connected to nasal cannula oxygen Cardiovascular status: blood pressure returned to baseline and stable Postop Assessment: no apparent nausea or vomiting Anesthetic complications: no   No notable events documented.   Last Vitals:  Vitals:   09/15/21 1200 09/15/21 1210  BP: (!) 125/50 (!) 116/50  Pulse: 100 94  Resp: 15 18  Temp:    SpO2: 98% 98%    Last Pain:  Vitals:   09/15/21 1210  TempSrc:   PainSc: 5                  Arita Miss

## 2021-09-15 NOTE — Anesthesia Preprocedure Evaluation (Signed)
Anesthesia Evaluation  Patient identified by MRN, date of birth, ID band Patient awake  General Assessment Comment:  Patient vomited copious yellow/green fluid in front of me   Reviewed: Allergy & Precautions, NPO status , Patient's Chart, lab work & pertinent test results, reviewed documented beta blocker date and time   History of Anesthesia Complications (+) PONV and history of anesthetic complications  Airway Mallampati: III  TM Distance: >3 FB Neck ROM: Full    Dental no notable dental hx. (+) Teeth Intact   Pulmonary asthma , sleep apnea, Continuous Positive Airway Pressure Ventilation and Oxygen sleep apnea , COPD,  oxygen dependent, Patient abstained from smoking.Not current smoker, former smoker,    Pulmonary exam normal breath sounds clear to auscultation       Cardiovascular Exercise Tolerance: Good METShypertension, (-) angina+ Peripheral Vascular Disease, +CHF and + DOE (Chronic, stable)  (-) CAD and (-) Past MI (-) dysrhythmias  Rhythm:Regular Rate:Normal - Systolic murmurs  HLD   Neuro/Psych  Headaches, PSYCHIATRIC DISORDERS Depression  Peripheral neruopathy    GI/Hepatic GERD  ,(+)     (-) substance abuse  ,  IBS   Endo/Other  diabetesHypothyroidism Morbid obesity  Renal/GU negative Renal ROS     Musculoskeletal  (+) Arthritis ,   Abdominal (+) + obese (BMI 48),   Peds  Hematology  (+) Blood dyscrasia, anemia ,   Anesthesia Other Findings Past Medical History: No date: Allergic rhinitis No date: Allergy No date: Asthma No date: Back pain No date: CHF (congestive heart failure) (HCC) No date: COPD (chronic obstructive pulmonary disease) (HCC) No date: Degenerative joint disease of knee, left No date: Depression No date: Diabetes mellitus without complication (HCC) No date: Edema No date: Elevated lipids No date: GERD (gastroesophageal reflux disease) No date: Hidradenitis No date:  History of colonic polyps No date: Hypertension No date: Hypothyroidism No date: IBS (irritable bowel syndrome) No date: Insomnia No date: Neuropathy     Comment:  feet and legs No date: Obesity No date: Oxygen dependent No date: Pneumonia No date: PONV (postoperative nausea and vomiting) No date: PVD (peripheral vascular disease) (HCC) No date: Sinusitis, chronic No date: Sleep apnea No date: Thyroid disease No date: Vaginitis, atrophic No date: Vertigo No date: Vitamin D deficiency  Reproductive/Obstetrics                             Anesthesia Physical  Anesthesia Plan  ASA: 3  Anesthesia Plan: General   Post-op Pain Management:    Induction: Intravenous and Rapid sequence  PONV Risk Score and Plan: 4 or greater and TIVA, Midazolam, Treatment may vary due to age or medical condition and Ondansetron  Airway Management Planned: Oral ETT and Video Laryngoscope Planned  Additional Equipment: None  Intra-op Plan:   Post-operative Plan: Extubation in OR  Informed Consent: I have reviewed the patients History and Physical, chart, labs and discussed the procedure including the risks, benefits and alternatives for the proposed anesthesia with the patient or authorized representative who has indicated his/her understanding and acceptance.     Dental advisory given  Plan Discussed with: CRNA and Anesthesiologist  Anesthesia Plan Comments: (Will proceed with GETA given vomiting green fluid. Patient attributes this to copious prep liquid, and a concurrent migraine. I informed her of the increased risk of aspiration even with RSI, and that it wouldn't be unreasonable to reschedule, but patient said she would not come back for this procedure again  if that happened, and accepted any potential in creased risk. I believe it is reasonable to proceed given this. Discussed risks of anesthesia with patient, including PONV, sore throat, lip/dental/eye damage.  Rare risks discussed as well, such as cardiorespiratory and neurological sequelae, and allergic reactions. Discussed the role of CRNA in patient's perioperative care. Patient understands. Patient informed about increased incidence of above perioperative risk due to high BMI. Patient understands. )        Anesthesia Quick Evaluation

## 2021-09-15 NOTE — OR Nursing (Signed)
Tap water enema administered. Last BM clear

## 2021-09-15 NOTE — Transfer of Care (Signed)
Immediate Anesthesia Transfer of Care Note  Patient: SANYLA SUMMEY  Procedure(s) Performed: COLONOSCOPY WITH PROPOFOL  Patient Location: Endoscopy Unit  Anesthesia Type:General  Level of Consciousness: awake, drowsy and patient cooperative  Airway & Oxygen Therapy: Patient Spontanous Breathing and Patient connected to face mask oxygen  Post-op Assessment: Report given to RN and Post -op Vital signs reviewed and stable  Post vital signs: Reviewed and stable  Last Vitals:  Vitals Value Taken Time  BP 158/61 09/15/21 1140  Temp 36.2 C 09/15/21 1140  Pulse 101 09/15/21 1147  Resp 17 09/15/21 1147  SpO2 96 % 09/15/21 1147  Vitals shown include unvalidated device data.  Last Pain:  Vitals:   09/15/21 1140  TempSrc: Temporal  PainSc:          Complications: No notable events documented.

## 2021-09-15 NOTE — H&P (Signed)
Cephas Darby, MD [address]  [place]  [place], [place] [address]  Main: [phone]  Fax: [phone] Pager: [phone]  Primary Care Physician:  Denton Lank, MD Primary Gastroenterologist:  Dr. Cephas Darby  Pre-Procedure History & Physical: HPI:  [name] [name] is a 66 y.o. female is here for an colonoscopy.   Past Medical History:  Diagnosis Date   Allergic rhinitis    Allergy    Asthma    Back pain    CHF (congestive heart failure) (HCC)    COPD (chronic obstructive pulmonary disease) (HCC)    Degenerative joint disease of knee, left    Depression    Diabetes mellitus without complication (HCC)    Edema    Elevated lipids    GERD (gastroesophageal reflux disease)    Hidradenitis    History of colonic polyps    Hypertension    Hypothyroidism    IBS (irritable bowel syndrome)    Insomnia    Neuropathy    feet and legs   Obesity    Oxygen dependent    Pneumonia    PONV (postoperative nausea and vomiting)    PVD (peripheral vascular disease) (HCC)    Sinusitis, chronic    Sleep apnea    Thyroid disease    Vaginitis, atrophic    Vertigo    Vitamin D deficiency     Past Surgical History:  Procedure Laterality Date   ABDOMINAL HYSTERECTOMY     AXILLARY HIDRADENITIS EXCISION     BREAST EXCISIONAL BIOPSY Bilateral [id number]/8242   RUPTURED FOLLICULAR CYSTS WITH ABSCESSES AND SCARRING, CONSISTENT WITH HIDRADENITIS SUPPURATIVA.    CATARACT EXTRACTION W/PHACO Right 07/27/2020   Procedure: CATARACT EXTRACTION PHACO AND INTRAOCULAR LENS PLACEMENT (IOC) RIGHT DIABETIC 17.13 01:23.9;  Surgeon: Birder Robson, MD;  Location: [place];  Service: Ophthalmology;  Laterality: Right;  Diabetic - insulin  sleep apnea wants to be last   CESAREAN SECTION     x 2   CHOLECYSTECTOMY     HEEL SPUR EXCISION N/A    HYDRADENITIS EXCISION Right 12/31/2015   Procedure: EXCISION HIDRADENITIS AXILLA;  Surgeon: Clayburn Pert, MD;  Location: [facility];   Service: General;  Laterality: Right;   KNEE SURGERY Left    1998   TONSILLECTOMY      Prior to Admission medications   Medication Sig Start Date End Date Taking? Authorizing Provider  albuterol (PROVENTIL) (2.5 MG/3ML) 0.083% nebulizer solution Take 2.5 mg by nebulization every 6 (six) hours as needed for wheezing or shortness of breath.    Yes [provider]  atorvastatin (LIPITOR) 20 MG tablet Take 20 mg by mouth at bedtime.    Yes [provider]  budesonide-formoterol (SYMBICORT) 160-4.5 MCG/ACT inhaler Inhale 2 puffs into the lungs 2 (two) times daily.   Yes [provider]  cetirizine (ZYRTEC) 10 MG tablet Take 10 mg by mouth daily.   Yes [provider]  doxepin (SINEQUAN) 50 MG capsule Take 50 mg by mouth at bedtime.   Yes [provider]  DULoxetine (CYMBALTA) 20 MG capsule Take 40 mg by mouth daily.   Yes [provider]  empagliflozin (JARDIANCE) 10 MG TABS tablet Take 25 mg by mouth daily.   Yes [provider]  ENTRESTO 24-26 MG TAKE ONE (1) TABLET BY MOUTH TWO TIMES PER DAY 07/02/21  Yes Darylene Price A, FNP  gabapentin (NEURONTIN) 300 MG capsule Take 600 mg by mouth 2 (two) times daily. 03/25/17  Yes [provider]  HUMULIN R U-500 KWIKPEN 500 UNIT/ML injection Inject 115 Units into the skin 2 (two) times daily with a meal.   Yes [provider]  ipratropium (ATROVENT HFA) 17 MCG/ACT inhaler Inhale 2 puffs into the lungs every 6 (six) hours as needed for wheezing. Patient uses once a day   Yes [provider]  levocetirizine (XYZAL) 5 MG tablet TAKE 1 TABLET BY MOUTH EVERY EVENING 04/28/21  Yes Moye, [place], MD  levothyroxine (SYNTHROID, LEVOTHROID) 200 MCG tablet Take 175 mcg by mouth daily before breakfast.   Yes [provider]  meclizine (ANTIVERT) 25 MG tablet Take 25 mg by mouth 3 (three) times daily as needed for dizziness.   Yes [provider]  montelukast  (SINGULAIR) 10 MG tablet Take 10 mg by mouth at bedtime.    Yes [provider]  omeprazole (PRILOSEC) 40 MG capsule Take 40 mg by mouth daily.   Yes [provider]  potassium citrate (UROCIT-K) 10 MEQ (1080 MG) SR tablet Take 1 tablet (10 mEq total) by mouth every morning. 01/17/21  Yes Hackney, Tina A, FNP  roflumilast (DALIRESP) 500 MCG TABS tablet Take 500 mcg by mouth daily.   Yes [provider]  spironolactone (ALDACTONE) 50 MG tablet Take 100 mg by mouth daily.    Yes [provider]  torsemide (DEMADEX) 20 MG tablet TAKE  1 TABLETS BY MOUTH daily 01/17/21  Yes Darylene Price A, FNP  traZODone (DESYREL) 50 MG tablet Take 50-100 mg by mouth at bedtime as needed for sleep.    Yes [provider]  acetaminophen (TYLENOL) 500 MG tablet Take 500 mg by mouth every 6 (six) hours as needed.    [provider]  albuterol (PROVENTIL HFA;VENTOLIN HFA) 108 (90 Base) MCG/ACT inhaler Inhale 2 puffs into the lungs every 6 (six) hours as needed for wheezing or shortness of breath.    [provider]  aspirin EC 81 MG tablet Take 81 mg by mouth daily.    [provider]  cyclobenzaprine (FLEXERIL) 10 MG tablet Take 10 mg by mouth at bedtime.    [provider]  fluticasone Asencion Islam) 50 MCG/ACT nasal spray  06/24/18   [provider]  glucose blood test strip TEST three times a day 12/30/15   [provider]  ibuprofen (ADVIL,MOTRIN) 800 MG tablet Take 1 tablet (800 mg total) by mouth every 8 (eight) hours as needed for mild pain or moderate pain (with food). 12/21/16   Eula Listen, MD  ketoconazole (NIZORAL) 2 % cream Apply twice daily for 2 weeks then as needed for rash to areas at lower abdomen. 11/13/19   Moye, [place], MD  Lancets Misc. (ACCU-CHEK FASTCLIX LANCET) KIT  12/27/15   [provider]  Melatonin 5 MG CAPS Take 2 capsules by mouth daily.    [provider]  mometasone  (NASONEX) 50 MCG/ACT nasal spray Place 2 sprays into both nostrils daily.     [provider]  olopatadine (PATANOL) 0.1 % ophthalmic solution Place 1 drop into both eyes 2 (two) times daily.    [provider]  oxyCODONE-acetaminophen (PERCOCET/ROXICET) 5-325 MG tablet Take 1 tablet by mouth every 6 (six) hours as needed for severe pain. Patient not taking: Reported on 07/12/2021 09/29/19   Cuthriell, Charline Bills, PA-C  OXYGEN Inhale into the lungs. With CPAP    [provider]  Semaglutide,0.25 or 0.5MG/DOS, (OZEMPIC, 0.25 OR 0.5 MG/DOSE,) 2 MG/1.5ML SOPN Inject into the skin once a  week.    [provider]    Allergies as of 08/16/2021 - Review Complete 08/16/2021  Allergen Reaction Noted   Codeine Itching 09/15/2014   Levofloxacin Rash 07/27/2020    Family History  Problem Relation Age of Onset   Rashes / Skin problems Father    Hypertension Father    Heart disease Father    Breast cancer Paternal Grandmother    COPD Mother    Kidney cancer Neg Hx    Bladder Cancer Neg Hx     Social History   Socioeconomic History   Marital status: Divorced    Spouse name: Not on file   Number of children: Not on file   Years of education: Not on file   Highest education level: Not on file  Occupational History   Occupation: disabled  Tobacco Use   Smoking status: Former    Packs/day: 3.00    Years: 20.00    Total pack years: 60.00    Types: Cigarettes    Quit date: 12/18/1993    Years since quitting: 27.7   Smokeless tobacco: Never  Vaping Use   Vaping Use: Never used  Substance and Sexual Activity   Alcohol use: No   Drug use: No   Sexual activity: Never    Birth control/protection: Abstinence  Other Topics Concern   Not on file  Social History Narrative   Not on file   Social Determinants of Health   Financial Resource Strain: Low Risk  (03/09/2017)   Overall Financial Resource Strain (CARDIA)    Difficulty of Paying Living Expenses:  Not hard at all  Food Insecurity: Unknown (03/09/2017)   Hunger Vital Sign    Worried About [place] in the Last Year: Patient refused    [place] in the Last Year: Patient refused  Transportation Needs: Unknown (03/09/2017)   [place] - Transportation    Lack of Transportation (Medical): Patient refused    Lack of Transportation (Non-Medical): Patient refused  Physical Activity: Unknown (03/09/2017)   Exercise Vital Sign    Days of Exercise per Week: Patient refused    Minutes of Exercise per Session: Patient refused  Stress: No Stress Concern Present (03/09/2017)   [place] of Stress : Not at all  Social Connections: Somewhat Isolated (03/09/2017)   Social Connection and Isolation Panel [NHANES]    Frequency of Communication with Friends and Family: More than three times a week    Frequency of Social Gatherings with Friends and Family: Once a week    Attends Religious Services: More than 4 times per year    Active Member of [employer] or Organizations: No    Attends Archivist Meetings: Never    Marital Status: Divorced  Human resources officer Violence: Unknown (03/09/2017)   Humiliation, Afraid, Rape, and Kick questionnaire    Fear of Current or Ex-Partner: Patient refused    Emotionally Abused: Patient refused    Physically Abused: Patient refused    Sexually Abused: Patient refused    Review of Systems: See HPI, otherwise negative ROS  Physical Exam: BP (!) 129/56   Pulse 92   Temp (!) 97 F (36.1 C) (Temporal)   Resp 20   Ht 5' 3" (1.6 m)   Wt 108.9 kg   SpO2 98%   BMI 42.51 kg/m  General:   Alert,  pleasant and cooperative in NAD Head:  Normocephalic and atraumatic. Neck:  Supple; no masses or thyromegaly. Lungs:  Clear throughout to auscultation.    Heart:  Regular rate and rhythm. Abdomen:  Soft, nontender and nondistended. Normal bowel sounds, without guarding,  and without rebound.   Neurologic:  Alert and  oriented x4;  grossly normal neurologically.  Impression/Plan: [name] [name] is here for an colonoscopy to be performed for h/o colon polyps  Risks, benefits, limitations, and alternatives regarding  colonoscopy have been reviewed with the patient.  Questions have been answered.  All parties agreeable.   Sherri Sear, MD  09/15/2021, 10:35 AM

## 2021-09-15 NOTE — Anesthesia Procedure Notes (Addendum)
Procedure Name: Intubation Date/Time: 09/15/2021 10:55 AM  Performed by: Kelton Pillar, CRNAPre-anesthesia Checklist: Patient identified, Emergency Drugs available, Suction available and Patient being monitored Patient Re-evaluated:Patient Re-evaluated prior to induction Oxygen Delivery Method: Circle system utilized Preoxygenation: Pre-oxygenation with 100% oxygen Induction Type: IV induction Ventilation: Mask ventilation without difficulty Laryngoscope Size: McGraph and 4 Grade View: Grade I Tube type: Oral Tube size: 7.0 mm Number of attempts: 1 Airway Equipment and Method: Stylet and Oral airway Placement Confirmation: ETT inserted through vocal cords under direct vision, positive ETCO2, breath sounds checked- equal and bilateral and CO2 detector Secured at: 21 cm Tube secured with: Tape Dental Injury: Teeth and Oropharynx as per pre-operative assessment

## 2021-09-15 NOTE — Op Note (Signed)
[facility] Gastroenterology Patient Name: [name] [name] Procedure Date: 09/15/2021 10:49 AM MRN: [id number] Account #: [id number] Date of Birth: 1955/07/03 Admit Type: Outpatient Age: 66 Room: [facility] ENDO ROOM 4 Gender: Female Note Status: Finalized Instrument Name: Park Meo [id number] Procedure:             Colonoscopy Indications:           High risk colon cancer surveillance: Personal history                         of colonic polyps Providers:             Lin Landsman MD, MD Referring MD:          Denton Lank MD, MD (Referring MD) Medicines:             General Anesthesia Complications:         No immediate complications. Estimated blood loss:                         Minimal. Procedure:             Pre-Anesthesia Assessment:                        - Prior to the procedure, a History and Physical was                         performed, and patient medications and allergies were                         reviewed. The patient is competent. The risks and                         benefits of the procedure and the sedation options and                         risks were discussed with the patient. All questions                         were answered and informed consent was obtained.                         Patient identification and proposed procedure were                         verified by the physician, the nurse, the                         anesthesiologist, the anesthetist and the technician                         in the pre-procedure area in the procedure room in the                         endoscopy suite. Mental Status Examination: alert and                         oriented. Airway Examination: normal oropharyngeal  airway and neck mobility. Respiratory Examination:                         clear to auscultation. CV Examination: normal.                         Prophylactic Antibiotics: The patient does not require                          prophylactic antibiotics. Prior Anticoagulants: The                         patient has taken no previous anticoagulant or                         antiplatelet agents. ASA Grade Assessment: III - A                         patient with severe systemic disease. After reviewing                         the risks and benefits, the patient was deemed in                         satisfactory condition to undergo the procedure. The                         anesthesia plan was to use general anesthesia.                         Immediately prior to administration of medications,                         the patient was re-assessed for adequacy to receive                         sedatives. The heart rate, respiratory rate, oxygen                         saturations, blood pressure, adequacy of pulmonary                         ventilation, and response to care were monitored                         throughout the procedure. The physical status of the                         patient was re-assessed after the procedure.                        After obtaining informed consent, the colonoscope was                         passed under direct vision. Throughout the procedure,                         the patient's blood pressure, pulse, and oxygen  saturations were monitored continuously. The                         Colonoscope was introduced through the anus and                         advanced to the the cecum, identified by appendiceal                         orifice and ileocecal valve. The colonoscopy was                         technically difficult and complex due to inadequate                         bowel prep, significant looping and the patient's body                         habitus. Successful completion of the procedure was                         aided by applying abdominal pressure and lavage. The                         patient tolerated the procedure well. The quality of                          the bowel preparation was evaluated using the BBPS                         [facility] Bowel Preparation Scale) with scores of: Right                         Colon = 2 (minor amount of residual staining, small                         fragments of stool and/or opaque liquid, but mucosa                         seen well), Transverse Colon = 2 (minor amount of                         residual staining, small fragments of stool and/or                         opaque liquid, but mucosa seen well) and Left Colon =                         2 (minor amount of residual staining, small fragments                         of stool and/or opaque liquid, but mucosa seen well).                         The total BBPS score equals 6. Findings:      The perianal and digital rectal examinations were normal. Pertinent  negatives include normal sphincter tone and no palpable rectal lesions.      Skin tags were found on perianal exam.      A 4 mm polyp was found in the cecum. The polyp was sessile. The polyp       was removed with a cold snare. Resection and retrieval were complete.      Four sessile polyps were found in the transverse colon. The polyps were       6 to 7 mm in size. These polyps were removed with a cold snare.       Resection and retrieval were complete. To prevent bleeding after the       polypectomy, three hemostatic clips were successfully placed. There was       no bleeding at the end of the procedure. Estimated blood loss was       minimal.      A 5 mm polyp was found in the descending colon. The polyp was sessile.       The polyp was removed with a cold snare. Resection and retrieval were       complete. Estimated blood loss: none.      Many diverticula were found in the recto-sigmoid colon and sigmoid colon.      Non-bleeding external hemorrhoids were found during retroflexion. The       hemorrhoids were medium-sized. Impression:            - Perianal skin tags  found on perianal exam.                        - One 4 mm polyp in the cecum, removed with a cold                         snare. Resected and retrieved.                        - Four 6 to 7 mm polyps in the transverse colon,                         removed with a cold snare. Resected and retrieved.                         Clips were placed.                        - One 5 mm polyp in the descending colon, removed with                         a cold snare. Resected and retrieved.                        - Diverticulosis in the recto-sigmoid colon and in the                         sigmoid colon.                        - Non-bleeding external hemorrhoids. Recommendation:        - Discharge patient to home (with escort).                        -  Resume previous diet today.                        - Continue present medications.                        - Await pathology results.                        - Repeat colonoscopy in 3 years with 2 day prep for                         surveillance of multiple polyps. Procedure Code(s):     --- Professional ---                        [phone], Colonoscopy, flexible; with removal of                         tumor(s), polyp(s), or other lesion(s) by snare                         technique Diagnosis Code(s):     --- Professional ---                        K63.5, Polyp of colon                        K64.4, Residual hemorrhoidal skin tags                        Z86.010, Personal history of colonic polyps                        K57.30, Diverticulosis of large intestine without                         perforation or abscess without bleeding CPT copyright 2019 American Medical Association. All rights reserved. The codes documented in this report are preliminary and upon coder review may  be revised to meet current compliance requirements. Dr. Ulyess Mort Lin Landsman MD, MD 09/15/2021 11:36:58 AM This report has been signed electronically. Number of  Addenda: 0 Note Initiated On: 09/15/2021 10:49 AM Scope Withdrawal Time: 0 hours 26 minutes 4 seconds  Total Procedure Duration: 0 hours 31 minutes 17 seconds  Estimated Blood Loss:  Estimated blood loss was minimal.      [facility]

## 2021-09-16 ENCOUNTER — Ambulatory Visit: Payer: Medicare Other | Admitting: Cardiology

## 2021-09-16 ENCOUNTER — Encounter: Payer: Self-pay | Admitting: Gastroenterology

## 2021-09-16 LAB — SURGICAL PATHOLOGY

## 2021-10-10 ENCOUNTER — Other Ambulatory Visit: Payer: Self-pay | Admitting: Dermatology

## 2021-10-10 DIAGNOSIS — L308 Other specified dermatitis: Secondary | ICD-10-CM

## 2021-10-10 DIAGNOSIS — L0390 Cellulitis, unspecified: Secondary | ICD-10-CM

## 2021-10-26 ENCOUNTER — Other Ambulatory Visit: Payer: Self-pay | Admitting: Family Medicine

## 2021-10-26 DIAGNOSIS — Z1231 Encounter for screening mammogram for malignant neoplasm of breast: Secondary | ICD-10-CM

## 2021-11-11 ENCOUNTER — Ambulatory Visit (INDEPENDENT_AMBULATORY_CARE_PROVIDER_SITE_OTHER): Payer: Medicare Other | Admitting: Cardiology

## 2021-11-11 ENCOUNTER — Encounter: Payer: Self-pay | Admitting: Cardiology

## 2021-11-11 VITALS — BP 100/50 | HR 88 | Ht 63.0 in | Wt 240.1 lb

## 2021-11-11 DIAGNOSIS — I5032 Chronic diastolic (congestive) heart failure: Secondary | ICD-10-CM

## 2021-11-11 DIAGNOSIS — I10 Essential (primary) hypertension: Secondary | ICD-10-CM | POA: Diagnosis not present

## 2021-11-11 NOTE — Progress Notes (Signed)
Cardiology Office Note:    Date:  11/11/2021   ID:  [name] [name], DOB 03/05/1956, MRN [id number]  PCP:  Denton Lank, MD  Cardiologist:  Kate Sable, MD  Electrophysiologist:  None   Referring MD: Denton Lank, MD   Chief Complaint  Patient presents with   Other    OD 12 Month f/u no complaints today. Meds reviewed verbally with pt.    History of Present Illness:    [name] [name] is a 66 y.o. female with a hx of HFpEF, hypertension, former smoker x30+ years, COPD, diabetes, , morbid obesity, OSA on CPAP who presents for follow-up.    Being seen for HFpEF, tolerating current medications including torsemide, Aldactone as prescribed.  Also on Entresto.  Blood pressures have been running low of late.  Denies dizziness.  Edema is adequately controlled.  Prior notes Echo 03/2021 EF 60 to 00%, grade 2 diastolic dysfunction Myoview 05/2019 no evidence for ischemia.   Past Medical History:  Diagnosis Date   Allergic rhinitis    Allergy    Asthma    Back pain    CHF (congestive heart failure) (HCC)    COPD (chronic obstructive pulmonary disease) (HCC)    Degenerative joint disease of knee, left    Depression    Diabetes mellitus without complication (HCC)    Edema    Elevated lipids    GERD (gastroesophageal reflux disease)    Hidradenitis    History of colonic polyps    Hypertension    Hypothyroidism    IBS (irritable bowel syndrome)    Insomnia    Neuropathy    feet and legs   Obesity    Oxygen dependent    Pneumonia    PONV (postoperative nausea and vomiting)    PVD (peripheral vascular disease) (HCC)    Sinusitis, chronic    Sleep apnea    Thyroid disease    Vaginitis, atrophic    Vertigo    Vitamin D deficiency     Past Surgical History:  Procedure Laterality Date   ABDOMINAL HYSTERECTOMY     AXILLARY HIDRADENITIS EXCISION     BREAST EXCISIONAL BIOPSY Bilateral 86/7619   RUPTURED FOLLICULAR CYSTS WITH ABSCESSES AND SCARRING, CONSISTENT WITH  HIDRADENITIS SUPPURATIVA.    CATARACT EXTRACTION W/PHACO Right 07/27/2020   Procedure: CATARACT EXTRACTION PHACO AND INTRAOCULAR LENS PLACEMENT (IOC) RIGHT DIABETIC 17.13 01:23.9;  Surgeon: Birder Robson, MD;  Location: [place];  Service: Ophthalmology;  Laterality: Right;  Diabetic - insulin  sleep apnea wants to be last   CESAREAN SECTION     x 2   CHOLECYSTECTOMY     COLONOSCOPY WITH PROPOFOL N/A 09/15/2021   Procedure: COLONOSCOPY WITH PROPOFOL;  Surgeon: Lin Landsman, MD;  Location: [facility];  Service: Gastroenterology;  Laterality: N/A;   HEEL SPUR EXCISION N/A    HYDRADENITIS EXCISION Right 12/31/2015   Procedure: EXCISION HIDRADENITIS AXILLA;  Surgeon: Clayburn Pert, MD;  Location: [facility] ORS;  Service: General;  Laterality: Right;   KNEE SURGERY Left    1998   TONSILLECTOMY      Current Medications: Current Meds  Medication Sig   acetaminophen (TYLENOL) 500 MG tablet Take 500 mg by mouth every 6 (six) hours as needed.   albuterol (PROVENTIL HFA;VENTOLIN HFA) 108 (90 Base) MCG/ACT inhaler Inhale 2 puffs into the lungs every 6 (six) hours as needed for wheezing or shortness of breath.   aspirin EC 81 MG tablet Take 81 mg by mouth daily.  atorvastatin (LIPITOR) 20 MG tablet Take 20 mg by mouth at bedtime.    budesonide-formoterol (SYMBICORT) 160-4.5 MCG/ACT inhaler Inhale 2 puffs into the lungs 2 (two) times daily.   cetirizine (ZYRTEC) 10 MG tablet Take 10 mg by mouth daily.   cyclobenzaprine (FLEXERIL) 10 MG tablet Take 10 mg by mouth at bedtime.   doxepin (SINEQUAN) 50 MG capsule Take 50 mg by mouth at bedtime.   DULoxetine (CYMBALTA) 20 MG capsule Take 40 mg by mouth daily.   empagliflozin (JARDIANCE) 10 MG TABS tablet Take 25 mg by mouth daily.   ENTRESTO 24-26 MG TAKE ONE (1) TABLET BY MOUTH TWO TIMES PER DAY   fluticasone (FLONASE) 50 MCG/ACT nasal spray    gabapentin (NEURONTIN) 300 MG capsule Take 600 mg by mouth 2 (two) times daily.    glucose blood test strip TEST three times a day   ibuprofen (ADVIL,MOTRIN) 800 MG tablet Take 1 tablet (800 mg total) by mouth every 8 (eight) hours as needed for mild pain or moderate pain (with food).   ipratropium (ATROVENT HFA) 17 MCG/ACT inhaler Inhale 2 puffs into the lungs every 6 (six) hours as needed for wheezing. Patient uses once a day   Lancets Misc. (ACCU-CHEK FASTCLIX LANCET) KIT    levocetirizine (XYZAL) 5 MG tablet TAKE 1 TABLET BY MOUTH EVERY EVENING   levothyroxine (SYNTHROID, LEVOTHROID) 200 MCG tablet Take 175 mcg by mouth daily before breakfast.   meclizine (ANTIVERT) 25 MG tablet Take 25 mg by mouth 3 (three) times daily as needed for dizziness.   Melatonin 5 MG CAPS Take 2 capsules by mouth daily.   mupirocin ointment (BACTROBAN) 2 % APPLY TOPICALLY EVERY DAY WITH DRESSING CHANGES   olopatadine (PATANOL) 0.1 % ophthalmic solution Place 1 drop into both eyes 2 (two) times daily.   omeprazole (PRILOSEC) 40 MG capsule Take 40 mg by mouth daily.   OXYGEN Inhale into the lungs. With CPAP   potassium citrate (UROCIT-K) 10 MEQ (1080 MG) SR tablet Take 1 tablet (10 mEq total) by mouth every morning.   roflumilast (DALIRESP) 500 MCG TABS tablet Take 500 mcg by mouth daily.   Semaglutide,0.25 or 0.5MG/DOS, (OZEMPIC, 0.25 OR 0.5 MG/DOSE,) 2 MG/1.5ML SOPN Inject into the skin once a week.   spironolactone (ALDACTONE) 50 MG tablet Take 100 mg by mouth daily.    torsemide (DEMADEX) 20 MG tablet TAKE  1 TABLETS BY MOUTH daily   traZODone (DESYREL) 50 MG tablet Take 50-100 mg by mouth at bedtime as needed for sleep.      Allergies:   Codeine and Levofloxacin   Social History   Socioeconomic History   Marital status: Divorced    Spouse name: Not on file   Number of children: Not on file   Years of education: Not on file   Highest education level: Not on file  Occupational History   Occupation: disabled  Tobacco Use   Smoking status: Former    Packs/day: 3.00    Years: 20.00     Total pack years: 60.00    Types: Cigarettes    Quit date: 12/18/1993    Years since quitting: 27.9   Smokeless tobacco: Never  Vaping Use   Vaping Use: Never used  Substance and Sexual Activity   Alcohol use: No   Drug use: No   Sexual activity: Never    Birth control/protection: Abstinence  Other Topics Concern   Not on file  Social History Narrative   Not on file   Social  Determinants of Health   Financial Resource Strain: Low Risk  (03/09/2017)   Overall Financial Resource Strain (CARDIA)    Difficulty of Paying Living Expenses: Not hard at all  Food Insecurity: Unknown (03/09/2017)   Hunger Vital Sign    Worried About Running Out of Food in the Last Year: Patient refused    [place] in the Last Year: Patient refused  Transportation Needs: Unknown (03/09/2017)   [place] - Hydrologist (Medical): Patient refused    Lack of Transportation (Non-Medical): Patient refused  Physical Activity: Unknown (03/09/2017)   Exercise Vital Sign    Days of Exercise per Week: Patient refused    Minutes of Exercise per Session: Patient refused  Stress: No Stress Concern Present (03/09/2017)   [place] of Stress : Not at all  Social Connections: Somewhat Isolated (03/09/2017)   Social Connection and Isolation Panel [NHANES]    Frequency of Communication with Friends and Family: More than three times a week    Frequency of Social Gatherings with Friends and Family: Once a week    Attends Religious Services: More than 4 times per year    Active Member of [employer] or Organizations: No    Attends Music therapist: Never    Marital Status: Divorced     Family History: The patient's family history includes Breast cancer in her paternal grandmother; COPD in her mother; Heart disease in her father; Hypertension in her father; Rashes / Skin problems in her father.  There is no history of Kidney cancer or Bladder Cancer.  ROS:   Please see the history of present illness.     All other systems reviewed and are negative.  EKGs/Labs/Other Studies Reviewed:    The following studies were reviewed today:   EKG:  EKG is  ordered today.  The ekg ordered today demonstrates sinus rhythm, left anterior fascicular block.  Recent Labs: 01/17/2021: Magnesium 2.3 05/12/2021: BUN 33; Creatinine, Ser 1.16; Potassium 4.4; Sodium 132  Recent Lipid Panel    Component Value Date/Time   CHOL 107 09/18/2012 0622   TRIG 282 (H) 09/18/2012 0622   HDL 29 (L) 09/18/2012 0622   VLDL 56 (H) 09/18/2012 0622   LDLCALC 22 09/18/2012 0622    Physical Exam:    VS:  BP (!) 100/50 (BP Location: Left Arm, Patient Position: Sitting, Cuff Size: Large)   Pulse 88   Ht _0  (1.6 m)   Wt 240 lb 2 oz (108.9 kg)   SpO2 97%   BMI 42.54 kg/m     Wt Readings from Last 3 Encounters:  11/11/21 240 lb 2 oz (108.9 kg)  09/15/21 240 lb (108.9 kg)  07/12/21 249 lb (112.9 kg)     GEN:  Well nourished, well developed in no acute distress, obese HEENT: Normal NECK: No JVD; No carotid bruits CARDIAC: RRR, no murmurs, rubs, gallops RESPIRATORY:  Clear to auscultation without rales, wheezing or rhonchi  ABDOMEN: Soft, non-tender, distended MUSCULOSKELETAL: Trace edema; No deformity  SKIN: Warm and dry NEUROLOGIC:  Alert and oriented x 3 PSYCHIATRIC:  Normal affect   ASSESSMENT:    1. Chronic diastolic heart failure ([place])   2. Morbid obesity ([place])   3. Essential hypertension    PLAN:     HFpEF, appears euvolemic, continue torsemide. Morbid obesity, continue Ozempic, low-calorie diet, weight loss advised. History of hypertension, BP running low, reduce Aldactone to  half of current value, 50 mg daily.  Patient will call office to report current dose of Aldactone.  Consider titrating down meds if BP stays low.  Follow-up in 2 months.  This note was generated in part or  whole with voice recognition software. Voice recognition is usually quite accurate but there are transcription errors that can and very often do occur. I apologize for any typographical errors that were not detected and corrected.  Medication Adjustments/Labs and Tests Ordered: Current medicines are reviewed at length with the patient today.  Concerns regarding medicines are outlined above.  Orders Placed This Encounter  Procedures   EKG 12-Lead   No orders of the defined types were placed in this encounter.   Patient Instructions  Medication Instructions:   PLEASE CALL us WITH THE DOSE OF SPIRONOLACTONE YOU ARE CURRENTLY TAKING.   (START TAKING 1/2 THE DOSE YOU HAVE BEEN TAKING)   *If you need a refill on your cardiac medications before your next appointment, please call your pharmacy*    Follow-Up: At [facility], you and your health needs are our priority.  As part of our continuing mission to provide you with exceptional heart care, we have created designated Provider Care Teams.  These Care Teams include your primary Cardiologist (physician) and Advanced Practice Providers (APPs -  Physician Assistants and Nurse Practitioners) who all work together to provide you with the care you need, when you need it.  We recommend signing up for the patient portal called "MyChart".  Sign up information is provided on this After Visit Summary.  MyChart is used to connect with patients for Virtual Visits (Telemedicine).  Patients are able to view lab/test results, encounter notes, upcoming appointments, etc.  Non-urgent messages can be sent to your provider as well.   To learn more about what you can do with MyChart, go to [email or web address].    Your next appointment:   2 month(s)  The format for your next appointment:   In Person  Provider:   You may see Kate Sable, MD or one of the following Advanced Practice Providers on your designated Care Team:   Murray Hodgkins,  NP Christell Faith, PA-C Cadence Kathlen Mody, PA-C  Gerrie Nordmann, NP   Other Instructions   Important Information About Sugar         Signed, Kate Sable, MD  11/11/2021 4:19 PM    [place]

## 2021-11-11 NOTE — Patient Instructions (Signed)
Medication Instructions:   PLEASE CALL us WITH THE DOSE OF SPIRONOLACTONE YOU ARE CURRENTLY TAKING.   (START TAKING 1/2 THE DOSE YOU HAVE BEEN TAKING)   *If you need a refill on your cardiac medications before your next appointment, please call your pharmacy*    Follow-Up: At [facility], you and your health needs are our priority.  As part of our continuing mission to provide you with exceptional heart care, we have created designated Provider Care Teams.  These Care Teams include your primary Cardiologist (physician) and Advanced Practice Providers (APPs -  Physician Assistants and Nurse Practitioners) who all work together to provide you with the care you need, when you need it.  We recommend signing up for the patient portal called "MyChart".  Sign up information is provided on this After Visit Summary.  MyChart is used to connect with patients for Virtual Visits (Telemedicine).  Patients are able to view lab/test results, encounter notes, upcoming appointments, etc.  Non-urgent messages can be sent to your provider as well.   To learn more about what you can do with MyChart, go to [email or web address].    Your next appointment:   2 month(s)  The format for your next appointment:   In Person  Provider:   You may see Kate Sable, MD or one of the following Advanced Practice Providers on your designated Care Team:   Murray Hodgkins, NP Christell Faith, PA-C Cadence Kathlen Mody, PA-C  Gerrie Nordmann, NP   Other Instructions   Important Information About Sugar

## 2021-11-14 ENCOUNTER — Telehealth: Payer: Self-pay | Admitting: Cardiology

## 2021-11-14 DIAGNOSIS — I5032 Chronic diastolic (congestive) heart failure: Secondary | ICD-10-CM

## 2021-11-14 MED ORDER — SPIRONOLACTONE 50 MG PO TABS: 50.0000 mg | ORAL_TABLET | Freq: Every day | ORAL | Status: DC

## 2021-11-14 NOTE — Telephone Encounter (Signed)
Pt c/o medication issue:  1. Name of Medication:   spironolactone (ALDACTONE) 50 MG tablet    2. How are you currently taking this medication (dosage and times per day)? Take 100 mg by mouth daily  3. Are you having a reaction (difficulty breathing--STAT)? No  4. What is your medication issue? Pt wanted to make provider aware that PCP did not prescribe medication. Please advise

## 2021-11-14 NOTE — Telephone Encounter (Signed)
Patient was seen in office on 11/11/21. She was not sure what dose of Spironolactone she was taking, she was instructed to call us with the dose after she arrived home. She was also instructed to take half of the dose that she had been taking.  Patient called and informed me that she had been taking Spironolactone 100 MG daily, then realized a NP at her PCP's office had decreased the dose the day prior to seeing Dr. Garen Lah last week.   I advised that she continue with the 50 MG daily and keep a daily log of her BP. Also advised to inform us if her BP continues to run low.  Patient verbalized understanding and agreed with plan.

## 2021-12-12 ENCOUNTER — Other Ambulatory Visit: Payer: Self-pay | Admitting: Dermatology

## 2021-12-12 ENCOUNTER — Other Ambulatory Visit: Payer: Self-pay | Admitting: Family

## 2021-12-12 DIAGNOSIS — I5032 Chronic diastolic (congestive) heart failure: Secondary | ICD-10-CM

## 2021-12-12 DIAGNOSIS — L299 Pruritus, unspecified: Secondary | ICD-10-CM

## 2021-12-16 ENCOUNTER — Ambulatory Visit
Admission: RE | Admit: 2021-12-16 | Discharge: 2021-12-16 | Disposition: A | Discharge status: Home / Self Care | Payer: Medicare Other | Source: Ambulatory Visit | Attending: Family Medicine | Admitting: Family Medicine

## 2021-12-16 DIAGNOSIS — Z1231 Encounter for screening mammogram for malignant neoplasm of breast: Secondary | ICD-10-CM | POA: Diagnosis not present

## 2021-12-20 ENCOUNTER — Telehealth: Payer: Self-pay | Admitting: Cardiology

## 2021-12-20 NOTE — Telephone Encounter (Signed)
New Message:    Patient is calling to give her blood pressure reading as she was told to do  12-12-21-  119/62   111/51  12-13-21    138/55   119/53  12-14-21-   112/54    116/51  9-28--29   118/54    138/55 12-16-21     104/26      98/37 12-17-28     132/62      106/37   12-18-21-     112/49      118/49  12-19-21       121/56       101+3-23-    118/47

## 2021-12-21 ENCOUNTER — Telehealth: Payer: Self-pay | Admitting: Cardiology

## 2021-12-21 ENCOUNTER — Other Ambulatory Visit: Payer: Self-pay | Admitting: Family Medicine

## 2021-12-21 DIAGNOSIS — R928 Other abnormal and inconclusive findings on diagnostic imaging of breast: Secondary | ICD-10-CM

## 2021-12-21 DIAGNOSIS — R921 Mammographic calcification found on diagnostic imaging of breast: Secondary | ICD-10-CM

## 2021-12-21 NOTE — Telephone Encounter (Signed)
Called pt  informed we have not received a message back from Dr Charlestine Night. Verbalized understanding. She will continue to take BP and await call from Dr Gladstone Lighter message

## 2021-12-21 NOTE — Telephone Encounter (Signed)
Patient called to see if Dr. Charlestine Night has any apdates about her BP

## 2021-12-22 NOTE — Telephone Encounter (Signed)
Called patient and the phone just rings out, unable to leave a VM. Will try again later.  Diastolic blood pressure appears low, reduce Aldactone to 25 mg daily.

## 2021-12-23 DIAGNOSIS — M545 Low back pain, unspecified: Secondary | ICD-10-CM | POA: Insufficient documentation

## 2021-12-27 NOTE — Telephone Encounter (Signed)
Attempted to call patient and phone continues to ring out. No VM available. Will try again later.

## 2021-12-28 ENCOUNTER — Ambulatory Visit
Admission: RE | Admit: 2021-12-28 | Discharge: 2021-12-28 | Disposition: A | Discharge status: Home / Self Care | Payer: Medicare Other | Source: Ambulatory Visit | Attending: Family Medicine | Admitting: Family Medicine

## 2021-12-28 DIAGNOSIS — R928 Other abnormal and inconclusive findings on diagnostic imaging of breast: Secondary | ICD-10-CM | POA: Insufficient documentation

## 2021-12-28 DIAGNOSIS — R921 Mammographic calcification found on diagnostic imaging of breast: Secondary | ICD-10-CM | POA: Diagnosis present

## 2022-01-03 ENCOUNTER — Other Ambulatory Visit: Payer: Self-pay | Admitting: Family Medicine

## 2022-01-03 DIAGNOSIS — R928 Other abnormal and inconclusive findings on diagnostic imaging of breast: Secondary | ICD-10-CM

## 2022-01-03 DIAGNOSIS — R921 Mammographic calcification found on diagnostic imaging of breast: Secondary | ICD-10-CM

## 2022-01-06 ENCOUNTER — Other Ambulatory Visit: Payer: Self-pay | Admitting: Family

## 2022-01-12 ENCOUNTER — Ambulatory Visit (INDEPENDENT_AMBULATORY_CARE_PROVIDER_SITE_OTHER): Payer: Medicare Other | Admitting: Dermatology

## 2022-01-12 DIAGNOSIS — L700 Acne vulgaris: Secondary | ICD-10-CM

## 2022-01-12 DIAGNOSIS — L219 Seborrheic dermatitis, unspecified: Secondary | ICD-10-CM | POA: Diagnosis not present

## 2022-01-12 DIAGNOSIS — L570 Actinic keratosis: Secondary | ICD-10-CM

## 2022-01-12 DIAGNOSIS — R21 Rash and other nonspecific skin eruption: Secondary | ICD-10-CM | POA: Diagnosis not present

## 2022-01-12 DIAGNOSIS — L409 Psoriasis, unspecified: Secondary | ICD-10-CM

## 2022-01-12 DIAGNOSIS — L732 Hidradenitis suppurativa: Secondary | ICD-10-CM | POA: Diagnosis not present

## 2022-01-12 MED ORDER — KETOCONAZOLE 2 % EX SHAM: MEDICATED_SHAMPOO | CUTANEOUS | 11 refills | Status: DC

## 2022-01-12 MED ORDER — CLOBETASOL PROPIONATE 0.05 % EX FOAM: CUTANEOUS | 3 refills | Status: DC

## 2022-01-12 MED ORDER — CLINDAMYCIN PHOSPHATE 1 % EX GEL: CUTANEOUS | 11 refills | Status: DC

## 2022-01-12 MED ORDER — TRIAMCINOLONE ACETONIDE 0.1 % EX CREA: TOPICAL_CREAM | CUTANEOUS | 0 refills | Status: DC

## 2022-01-12 NOTE — Patient Instructions (Signed)
Due to recent changes in healthcare laws, you may see results of your pathology and/or laboratory studies on MyChart before the doctors have had a chance to review them. We understand that in some cases there may be results that are confusing or concerning to you. Please understand that not all results are received at the same time and often the doctors may need to interpret multiple results in order to provide you with the best plan of care or course of treatment. Therefore, we ask that you please give us 2 business days to thoroughly review all your results before contacting the office for clarification. Should we see a critical lab result, you will be contacted sooner.   If You Need Anything After Your Visit  If you have any questions or concerns for your doctor, please call our main line at [phone] and press option 4 to reach your doctor's medical assistant. If no one answers, please leave a voicemail as directed and we will return your call as soon as possible. Messages left after 4 pm will be answered the following business day.   You may also send us a message via MyChart. We typically respond to MyChart messages within 1-2 business days.  For prescription refills, please ask your pharmacy to contact our office. Our fax number is [phone].  If you have an urgent issue when the clinic is closed that cannot wait until the next business day, you can page your doctor at the number below.    Please note that while we do our best to be available for urgent issues outside of office hours, we are not available 24/7.   If you have an urgent issue and are unable to reach us, you may choose to seek medical care at your doctor's office, retail clinic, urgent care center, or emergency room.  If you have a medical emergency, please immediately call 911 or go to the emergency department.  Pager Numbers  - Dr. Kowalski: [phone]  - Dr. Moye: [phone]  - Dr. Stewart:  [phone]  In the event of inclement weather, please call our main line at [phone] for an update on the status of any delays or closures.  Dermatology Medication Tips: Please keep the boxes that topical medications come in in order to help keep track of the instructions about where and how to use these. Pharmacies typically print the medication instructions only on the boxes and not directly on the medication tubes.   If your medication is too expensive, please contact our office at [phone] option 4 or send us a message through MyChart.   We are unable to tell what your co-pay for medications will be in advance as this is different depending on your insurance coverage. However, we may be able to find a substitute medication at lower cost or fill out paperwork to get insurance to cover a needed medication.   If a prior authorization is required to get your medication covered by your insurance company, please allow us 1-2 business days to complete this process.  Drug prices often vary depending on where the prescription is filled and some pharmacies may offer cheaper prices.  The website www.goodrx.com contains coupons for medications through different pharmacies. The prices here do not account for what the cost may be with help from insurance (it may be cheaper with your insurance), but the website can give you the price if you did not use any insurance.  - You can print the associated coupon and take it with   your prescription to the pharmacy.  - You may also stop by our office during regular business hours and pick up a GoodRx coupon card.  - If you need your prescription sent electronically to a different pharmacy, notify our office through [facility] MyChart or by phone at [phone] option 4.     Si Usted Necesita Algo Despus de Su Visita  Tambin puede enviarnos un mensaje a travs de MyChart. Por lo general respondemos a los mensajes de MyChart en el transcurso de 1 a 2  das hbiles.  Para renovar recetas, por favor pida a su farmacia que se ponga en contacto con nuestra oficina. Nuestro nmero de fax es el [phone].  Si tiene un asunto urgente cuando la clnica est cerrada y que no puede esperar hasta el siguiente da hbil, puede llamar/localizar a su doctor(a) al nmero que aparece a continuacin.   Por favor, tenga en cuenta que aunque hacemos todo lo posible para estar disponibles para asuntos urgentes fuera del horario de oficina, no estamos disponibles las 24 horas del da, los 7 das de la semana.   Si tiene un problema urgente y no puede comunicarse con nosotros, puede optar por buscar atencin mdica  en el consultorio de su doctor(a), en una clnica privada, en un centro de atencin urgente o en una sala de emergencias.  Si tiene una emergencia mdica, por favor llame inmediatamente al 911 o vaya a la sala de emergencias.  Nmeros de bper  - Dr. Kowalski: [phone]  - Dra. Moye: [phone]  - Dra. Stewart: [phone]  En caso de inclemencias del tiempo, por favor llame a nuestra lnea principal al [phone] para una actualizacin sobre el estado de cualquier retraso o cierre.  Consejos para la medicacin en dermatologa: Por favor, guarde las cajas en las que vienen los medicamentos de uso tpico para ayudarle a seguir las instrucciones sobre dnde y cmo usarlos. Las farmacias generalmente imprimen las instrucciones del medicamento slo en las cajas y no directamente en los tubos del medicamento.   Si su medicamento es muy caro, por favor, pngase en contacto con nuestra oficina llamando al [phone] y presione la opcin 4 o envenos un mensaje a travs de MyChart.   No podemos decirle cul ser su copago por los medicamentos por adelantado ya que esto es diferente dependiendo de la cobertura de su seguro. Sin embargo, es posible que podamos encontrar un medicamento sustituto a menor costo o llenar un formulario para que el  seguro cubra el medicamento que se considera necesario.   Si se requiere una autorizacin previa para que su compaa de seguros cubra su medicamento, por favor permtanos de 1 a 2 das hbiles para completar este proceso.  Los precios de los medicamentos varan con frecuencia dependiendo del lugar de dnde se surte la receta y alguna farmacias pueden ofrecer precios ms baratos.  El sitio web www.goodrx.com tiene cupones para medicamentos de diferentes farmacias. Los precios aqu no tienen en cuenta lo que podra costar con la ayuda del seguro (puede ser ms barato con su seguro), pero el sitio web puede darle el precio si no utiliz ningn seguro.  - Puede imprimir el cupn correspondiente y llevarlo con su receta a la farmacia.  - Tambin puede pasar por nuestra oficina durante el horario de atencin regular y recoger una tarjeta de cupones de GoodRx.  - Si necesita que su receta se enve electrnicamente a una farmacia diferente, informe a nuestra oficina a travs de MyChart de [facility]   o por telfono llamando al [phone] y presione la opcin 4.  

## 2022-01-12 NOTE — Progress Notes (Deleted)
Follow-Up Visit   Subjective  [name] [name] is a 66 y.o. female who presents for the following: Annual follow up  (Patient currently using Ketoconazole 2% shampoo for seb derm of the scalp, Spironolactone as prescribed by PCP and Clindagel for HS of the axillary and inframammary areas. She continues to have comedones under her arms that are itchy and she would like them removed today).    The following portions of the chart were reviewed this encounter and updated as appropriate:       Review of Systems:  No other skin or systemic complaints except as noted in HPI or Assessment and Plan.  Objective  Well appearing patient in no apparent distress; mood and affect are within normal limits.  A focused examination was performed including the face, trunk, extremities. Relevant physical exam findings are noted in the Assessment and Plan.  Scalp Pink patches with greasy scale.   Axillary Large open comedones.  L chest x 2 Erythematous thin papules/macules with gritty scale.   Extremities Faint scaly pink plaques of the elbows, L leg, L knee.    Assessment & Plan  Seborrheic dermatitis Scalp  Seborrheic Dermatitis  -  is a chronic persistent rash characterized by pinkness and scaling most commonly of the mid face but also can occur on the scalp (dandruff), ears; mid chest, mid back and groin.  It tends to be exacerbated by stress and cooler weather.  People who have neurologic disease may experience new onset or exacerbation of existing seborrheic dermatitis.  The condition is not curable but treatable and can be controlled.  Continue Ketoconazole 2% shampoo 3d/wk.  Hidradenitis suppurativa Axillary  Comedones are bothersome, irritated, itchy -   Hidradenitis Suppurativa is a chronic; persistent; non-curable, but treatable condition due to abnormal inflamed sweat glands in the body folds (axilla, inframammary, groin, medial thighs), causing recurrent painful draining cysts  and scarring. It can be associated with severe scarring acne and cysts; also abscesses and scarring of scalp. The goal is control and prevention of flares, as it is not curable. Scars are permanent and can be thickened. Treatment may include daily use of topical medication and oral antibiotics.  Oral isotretinoin may also be helpful.  For more severe cases, Humira (a biologic injection) may be prescribed to decrease the inflammatory process and prevent flares.  When indicated, inflamed cysts may also be treated surgically.  Continue Spironolactone as prescribed by PCP, and Clindagel to aa's QD.   Acne/Milia surgery - Axillary Procedure risks and benefits were discussed with the patient and verbal consent was obtained. Following prep of the skin on the axilla with an alcohol swab, extraction of comedones was performed with a comedone extractor. Vaseline ointment was applied to each site. The patient tolerated the procedure well.  AK (actinic keratosis) L chest x 2  Destruction of lesion - L chest x 2 Complexity: simple   Destruction method: cryotherapy   Informed consent: discussed and consent obtained   Timeout:  patient name, date of birth, surgical site, and procedure verified Lesion destroyed using liquid nitrogen: Yes   Region frozen until ice ball extended beyond lesion: Yes   Outcome: patient tolerated procedure well with no complications   Post-procedure details: wound care instructions given    Rash R thigh  Dermatitis -   Start TMC 0.1% cream to aa's QD-BID PRN. Topical steroids (such as triamcinolone, fluocinolone, fluocinonide, mometasone, clobetasol, halobetasol, betamethasone, hydrocortisone) can cause thinning and lightening of the skin if they are used  for too long in the same area. Your physician has selected the right strength medicine for your problem and area affected on the body. Please use your medication only as directed by your physician to prevent side effects.      triamcinolone cream (KENALOG) 0.1 % - R thigh Apply to rash QD-BID PRN. Avoid applying to face, groin, and axilla. Use as directed. Long-term use can cause thinning of the skin.  Psoriasis Extremities  With joint aches, pains, and stiffness -   Psoriasis is a chronic non-curable, but treatable genetic/hereditary disease that may have other systemic features affecting other organ systems such as joints (Psoriatic Arthritis). It is associated with an increased risk of inflammatory bowel disease, heart disease, non-alcoholic fatty liver disease, and depression.    Recommend referral to rheumatologist to r/o PsA vs OA vs other.   Start Clobetasol foam to aa's QD-BID up to two weeks. Topical steroids (such as triamcinolone, fluocinolone, fluocinonide, mometasone, clobetasol, halobetasol, betamethasone, hydrocortisone) can cause thinning and lightening of the skin if they are used for too long in the same area. Your physician has selected the right strength medicine for your problem and area affected on the body. Please use your medication only as directed by your physician to prevent side effects.       Return in about 4 months (around 05/15/2022) for  recheck and follow up .  Luther Redo, CMA, am acting as scribe for Forest Gleason, MD .

## 2022-01-12 NOTE — Progress Notes (Signed)
Follow-Up Visit   Subjective  [name] [name] is a 66 y.o. female who presents for the following: Annual follow up  (Patient currently using Ketoconazole 2% shampoo for seb derm of the scalp, Spironolactone as prescribed by PCP and Clindagel for HS of the axillary and inframammary areas. She continues to have comedones under her arms that are itchy and she would like them removed today).  The following portions of the chart were reviewed this encounter and updated as appropriate:   Tobacco  Allergies  Meds  Problems  Med Hx  Surg Hx  Fam Hx      Review of Systems:  No other skin or systemic complaints except as noted in HPI or Assessment and Plan.  Objective  Well appearing patient in no apparent distress; mood and affect are within normal limits.  A focused examination was performed including the face, scalp, trunk, extremities. Relevant physical exam findings are noted in the Assessment and Plan.  Scalp Pink patches with greasy scale.   Axillary Large erythematous double open comedones with scarring at axillae  L chest x 2 Erythematous thin papules/macules with gritty scale.   R thigh Erythematous scaly thin plaques  Extremities Faint scaly pink plaques of the elbows, L leg, L knee.  Left Axilla Large erythematous double open comedones    Assessment & Plan  Seborrheic dermatitis Scalp  Chronic condition with duration or expected duration over one year. Currently well-controlled.  Seborrheic Dermatitis  -  is a chronic persistent rash characterized by pinkness and scaling most commonly of the mid face but also can occur on the scalp (dandruff), ears; mid chest, mid back and groin.  It tends to be exacerbated by stress and cooler weather.  People who have neurologic disease may experience new onset or exacerbation of existing seborrheic dermatitis.  The condition is not curable but treatable and can be controlled.  Continue Ketoconazole 2% shampoo  3d/wk.  ketoconazole (NIZORAL) 2 % shampoo - Scalp Shampoo into scalp let sit 10 minutes then wash out. Use 3d/wk.  Hidradenitis suppurativa Axillary  Chronic condition with duration or expected duration over one year. Currently well-controlled.  Hidradenitis Suppurativa is a chronic; persistent; non-curable, but treatable condition due to abnormal inflamed sweat glands in the body folds (axilla, inframammary, groin, medial thighs), causing recurrent painful draining cysts and scarring. It can be associated with severe scarring acne and cysts; also abscesses and scarring of scalp. The goal is control and prevention of flares, as it is not curable. Scars are permanent and can be thickened. Treatment may include daily use of topical medication and oral antibiotics.  Oral isotretinoin may also be helpful.  For more severe cases, Humira (a biologic injection) may be prescribed to decrease the inflammatory process and prevent flares.  When indicated, inflamed cysts may also be treated surgically.  Continue Spironolactone as prescribed by PCP, and Clindagel to aa's QD.   clindamycin (CLINDAGEL) 1 % gel - Axillary Apply to aa's under arms QD.  AK (actinic keratosis) L chest x 2  Actinic keratoses are precancerous spots that appear secondary to cumulative UV radiation exposure/sun exposure over time. They are chronic with expected duration over 1 year. A portion of actinic keratoses will progress to squamous cell carcinoma of the skin. It is not possible to reliably predict which spots will progress to skin cancer and so treatment is recommended to prevent development of skin cancer.  Recommend daily broad spectrum sunscreen SPF 30+ to sun-exposed areas, reapply every 2 hours  as needed.  Recommend staying in the shade or wearing long sleeves, sun glasses (UVA+UVB protection) and wide brim hats (4-inch brim around the entire circumference of the hat). Call for new or changing lesions.  Will plan LN2  at follow-up  Rash R thigh  Dermatitis vs psoriasis  Start Clobetasol foam to aa's QD-BID up to two weeks PRN. Topical steroids (such as triamcinolone, fluocinolone, fluocinonide, mometasone, clobetasol, halobetasol, betamethasone, hydrocortisone) can cause thinning and lightening of the skin if they are used for too long in the same area. Your physician has selected the right strength medicine for your problem and area affected on the body. Please use your medication only as directed by your physician to prevent side effects.      Psoriasis Extremities  With joint aches, pains, and stiffness -   Chronic and persistent condition with duration or expected duration over one year. Condition is symptomatic/ bothersome to patient. Not currently at goal.  Psoriasis is a chronic non-curable, but treatable genetic/hereditary disease that may have other systemic features affecting other organ systems such as joints (Psoriatic Arthritis). It is associated with an increased risk of inflammatory bowel disease, heart disease, non-alcoholic fatty liver disease, and depression.    Recommend referral to rheumatologist to r/o PsA vs OA vs other.   Start Clobetasol foam to aa's QD-BID up to two weeks. Topical steroids (such as triamcinolone, fluocinolone, fluocinonide, mometasone, clobetasol, halobetasol, betamethasone, hydrocortisone) can cause thinning and lightening of the skin if they are used for too long in the same area. Your physician has selected the right strength medicine for your problem and area affected on the body. Please use your medication only as directed by your physician to prevent side effects.      clobetasol (OLUX) 0.05 % topical foam - Extremities Apply to aa's psoriasis on the scalp and body BID up to two weeks PRN. Avoid applying to face, groin, and axilla. Use as directed. Long-term use can cause thinning of the skin.  Ambulatory referral to Rheumatology - Extremities  Open  comedone Left Axilla  Comedones are bothersome, irritated, itchy Secondary to hidradenitis suppurativa   Acne/Milia surgery - Left Axilla Procedure risks and benefits were discussed with the patient and verbal consent was obtained. Following prep of the skin on the axilla with an alcohol swab, extraction of comedones was performed with a comedone extractor. Vaseline ointment was applied to each site. The patient tolerated the procedure well.   Return in about 4 months (around 05/15/2022) for  recheck and follow up .  Luther Redo, CMA, am acting as scribe for Forest Gleason, MD .  Documentation: I have reviewed the above documentation for accuracy and completeness, and I agree with the above.  Forest Gleason, MD

## 2022-01-13 ENCOUNTER — Ambulatory Visit: Payer: Medicare Other | Attending: Cardiology | Admitting: Cardiology

## 2022-01-13 ENCOUNTER — Encounter: Payer: Self-pay | Admitting: Cardiology

## 2022-01-13 VITALS — BP 100/52 | HR 92 | Ht 63.0 in | Wt 249.6 lb

## 2022-01-13 DIAGNOSIS — I10 Essential (primary) hypertension: Secondary | ICD-10-CM

## 2022-01-13 DIAGNOSIS — I5032 Chronic diastolic (congestive) heart failure: Secondary | ICD-10-CM

## 2022-01-13 MED ORDER — SPIRONOLACTONE 25 MG PO TABS: 25.0000 mg | ORAL_TABLET | Freq: Every day | ORAL | 5 refills | Status: DC

## 2022-01-13 NOTE — Progress Notes (Signed)
Cardiology Office Note:    Date:  01/13/2022   ID:  AFRIKA [name], DOB Sep 13, 1955, MRN [id number]  PCP:  Denton Lank, MD  Cardiologist:  Kate Sable, MD  Electrophysiologist:  None   Referring MD: Denton Lank, MD   Chief Complaint  Patient presents with   Follow-up    HTN     History of Present Illness:    [name] [name] is a 66 y.o. female with a hx of HFpEF, hypertension, former smoker x30+ years, COPD, diabetes, , morbid obesity, OSA on CPAP who presents for follow-up.    Tolerating medications for HFpEF including torsemide.  Edema controlled on current dose of torsemide.  Blood pressure running low on last visit, Aldactone was reduced to 50 mg.  Previously taking Aldactone 100 mg daily for BP and HFpEF.  Blood pressures are still running low with systolics in the 440H.  Prior notes Echo 03/2021 EF 60 to 47%, grade 2 diastolic dysfunction Myoview 05/2019 no evidence for ischemia.   Past Medical History:  Diagnosis Date   Allergic rhinitis    Allergy    Asthma    Back pain    CHF (congestive heart failure) (HCC)    COPD (chronic obstructive pulmonary disease) (HCC)    Degenerative joint disease of knee, left    Depression    Diabetes mellitus without complication (HCC)    Edema    Elevated lipids    GERD (gastroesophageal reflux disease)    Hidradenitis    History of colonic polyps    Hypertension    Hypothyroidism    IBS (irritable bowel syndrome)    Insomnia    Neuropathy    feet and legs   Obesity    Oxygen dependent    Pneumonia    PONV (postoperative nausea and vomiting)    PVD (peripheral vascular disease) (HCC)    Sinusitis, chronic    Sleep apnea    Thyroid disease    Vaginitis, atrophic    Vertigo    Vitamin D deficiency     Past Surgical History:  Procedure Laterality Date   ABDOMINAL HYSTERECTOMY     AXILLARY HIDRADENITIS EXCISION     BREAST EXCISIONAL BIOPSY Bilateral 42/5956   RUPTURED FOLLICULAR CYSTS WITH ABSCESSES AND  SCARRING, CONSISTENT WITH HIDRADENITIS SUPPURATIVA.    CATARACT EXTRACTION W/PHACO Right 07/27/2020   Procedure: CATARACT EXTRACTION PHACO AND INTRAOCULAR LENS PLACEMENT (IOC) RIGHT DIABETIC 17.13 01:23.9;  Surgeon: Birder Robson, MD;  Location: [facility];  Service: Ophthalmology;  Laterality: Right;  Diabetic - insulin  sleep apnea wants to be last   CESAREAN SECTION     x 2   CHOLECYSTECTOMY     COLONOSCOPY WITH PROPOFOL N/A 09/15/2021   Procedure: COLONOSCOPY WITH PROPOFOL;  Surgeon: Lin Landsman, MD;  Location: [facility] ENDOSCOPY;  Service: Gastroenterology;  Laterality: N/A;   HEEL SPUR EXCISION N/A    HYDRADENITIS EXCISION Right 12/31/2015   Procedure: EXCISION HIDRADENITIS AXILLA;  Surgeon: Clayburn Pert, MD;  Location: [facility] ORS;  Service: General;  Laterality: Right;   KNEE SURGERY Left    1998   TONSILLECTOMY      Current Medications: Current Meds  Medication Sig   albuterol (PROVENTIL HFA;VENTOLIN HFA) 108 (90 Base) MCG/ACT inhaler Inhale 2 puffs into the lungs every 6 (six) hours as needed for wheezing or shortness of breath.   aspirin EC 81 MG tablet Take 81 mg by mouth daily.   atorvastatin (LIPITOR) 20 MG tablet Take 20 mg by mouth  at bedtime.    budesonide-formoterol (SYMBICORT) 160-4.5 MCG/ACT inhaler Inhale 2 puffs into the lungs 2 (two) times daily.   clindamycin (CLINDAGEL) 1 % gel Apply to aa's under arms QD.   clobetasol (OLUX) 0.05 % topical foam Apply to aa's psoriasis on the scalp and body BID up to two weeks PRN. Avoid applying to face, groin, and axilla. Use as directed. Long-term use can cause thinning of the skin.   cyclobenzaprine (FLEXERIL) 10 MG tablet Take 10 mg by mouth at bedtime.   doxepin (SINEQUAN) 50 MG capsule Take 50 mg by mouth at bedtime.   DULoxetine (CYMBALTA) 20 MG capsule Take 40 mg by mouth daily.   empagliflozin (JARDIANCE) 10 MG TABS tablet Take 25 mg by mouth daily.   fluticasone (FLONASE) 50 MCG/ACT nasal spray     gabapentin (NEURONTIN) 300 MG capsule Take 600 mg by mouth 2 (two) times daily.   glucose blood test strip TEST three times a day   ipratropium (ATROVENT HFA) 17 MCG/ACT inhaler Inhale 2 puffs into the lungs every 6 (six) hours as needed for wheezing. Patient uses once a day   ketoconazole (NIZORAL) 2 % shampoo Shampoo into scalp let sit 10 minutes then wash out. Use 3d/wk.   Lancets Misc. (ACCU-CHEK FASTCLIX LANCET) KIT    levocetirizine (XYZAL) 5 MG tablet TAKE 1 TABLET BY MOUTH EVERY EVENING   levothyroxine (SYNTHROID, LEVOTHROID) 200 MCG tablet Take 175 mcg by mouth daily before breakfast.   meclizine (ANTIVERT) 25 MG tablet Take 25 mg by mouth 3 (three) times daily as needed for dizziness.   Melatonin 5 MG CAPS Take 2 capsules by mouth daily.   olopatadine (PATANOL) 0.1 % ophthalmic solution Place 1 drop into both eyes 2 (two) times daily.   omeprazole (PRILOSEC) 40 MG capsule Take 40 mg by mouth daily.   OXYGEN Inhale into the lungs. With CPAP   potassium citrate (UROCIT-K) 10 MEQ (1080 MG) SR tablet Take 1 tablet (10 mEq total) by mouth every morning.   roflumilast (DALIRESP) 500 MCG TABS tablet Take 500 mcg by mouth daily.   Semaglutide,0.25 or 0.5MG/DOS, (OZEMPIC, 0.25 OR 0.5 MG/DOSE,) 2 MG/1.5ML SOPN Inject into the skin once a week.   torsemide (DEMADEX) 20 MG tablet TAKE ONE TABLET BY MOUTH DAILY   traZODone (DESYREL) 50 MG tablet Take 50-100 mg by mouth at bedtime as needed for sleep.    [DISCONTINUED] ENTRESTO 24-26 MG TAKE ONE (1) TABLET BY MOUTH TWO TIMES PER DAY   [DISCONTINUED] spironolactone (ALDACTONE) 50 MG tablet Take 1 tablet (50 mg total) by mouth daily.     Allergies:   Codeine and Levofloxacin   Social History   Socioeconomic History   Marital status: Divorced    Spouse name: Not on file   Number of children: Not on file   Years of education: Not on file   Highest education level: Not on file  Occupational History   Occupation: disabled  Tobacco Use    Smoking status: Former    Packs/day: 3.00    Years: 20.00    Total pack years: 60.00    Types: Cigarettes    Quit date: 12/18/1993    Years since quitting: 28.0   Smokeless tobacco: Never  Vaping Use   Vaping Use: Never used  Substance and Sexual Activity   Alcohol use: No   Drug use: No   Sexual activity: Never    Birth control/protection: Abstinence  Other Topics Concern   Not on file  Social  History Narrative   Not on file   Social Determinants of Health   Financial Resource Strain: Low Risk  (03/09/2017)   Overall Financial Resource Strain (CARDIA)    Difficulty of Paying Living Expenses: Not hard at all  Food Insecurity: Unknown (03/09/2017)   Hunger Vital Sign    Worried About Running Out of Food in the Last Year: Patient refused    [place] in the Last Year: Patient refused  Transportation Needs: Unknown (03/09/2017)   [place] - Hydrologist (Medical): Patient refused    Lack of Transportation (Non-Medical): Patient refused  Physical Activity: Unknown (03/09/2017)   Exercise Vital Sign    Days of Exercise per Week: Patient refused    Minutes of Exercise per Session: Patient refused  Stress: No Stress Concern Present (03/09/2017)   [place] of Stress : Not at all  Social Connections: Somewhat Isolated (03/09/2017)   Social Connection and Isolation Panel [NHANES]    Frequency of Communication with Friends and Family: More than three times a week    Frequency of Social Gatherings with Friends and Family: Once a week    Attends Religious Services: More than 4 times per year    Active Member of [employer] or Organizations: No    Attends Music therapist: Never    Marital Status: Divorced     Family History: The patient's family history includes Breast cancer in her paternal grandmother; COPD in her mother; Heart disease in her father;  Hypertension in her father; Rashes / Skin problems in her father. There is no history of Kidney cancer or Bladder Cancer.  ROS:   Please see the history of present illness.     All other systems reviewed and are negative.  EKGs/Labs/Other Studies Reviewed:    The following studies were reviewed today:   EKG:  EKG is  ordered today.  The ekg ordered today demonstrates sinus rhythm, left anterior fascicular block.  Recent Labs: 01/17/2021: Magnesium 2.3 05/12/2021: BUN 33; Creatinine, Ser 1.16; Potassium 4.4; Sodium 132  Recent Lipid Panel    Component Value Date/Time   CHOL 107 09/18/2012 0622   TRIG 282 (H) 09/18/2012 0622   HDL 29 (L) 09/18/2012 0622   VLDL 56 (H) 09/18/2012 0622   LDLCALC 22 09/18/2012 0622    Physical Exam:    VS:  BP (!) 100/52 (BP Location: Right Arm, Patient Position: Sitting, Cuff Size: Normal)   Pulse 92   Ht 5' 3" (1.6 m)   Wt 249 lb 9.6 oz (113.2 kg)   SpO2 95%   BMI 44.21 kg/m     Wt Readings from Last 3 Encounters:  01/13/22 249 lb 9.6 oz (113.2 kg)  11/11/21 240 lb 2 oz (108.9 kg)  09/15/21 240 lb (108.9 kg)     GEN:  Well nourished, well developed in no acute distress, obese HEENT: Normal NECK: No JVD; No carotid bruits CARDIAC: RRR, no murmurs, rubs, gallops RESPIRATORY:  Clear to auscultation without rales, wheezing or rhonchi  ABDOMEN: Soft, non-tender, distended MUSCULOSKELETAL: Trace edema; No deformity  SKIN: Warm and dry NEUROLOGIC:  Alert and oriented x 3 PSYCHIATRIC:  Normal affect   ASSESSMENT:    1. Chronic diastolic heart failure ([place])   2. Morbid obesity ([place])   3. Essential hypertension     PLAN:     HFpEF, appears euvolemic, continue torsemide, Aldactone 25 mg daily. Morbid  obesity, continue Ozempic, low-calorie diet, weight loss advised. History of hypertension, BP still running low.  Stop Entresto, reduce Aldactone to 25 mg daily.    Follow-up in 3 months.  This note was generated in part or whole with  voice recognition software. Voice recognition is usually quite accurate but there are transcription errors that can and very often do occur. I apologize for any typographical errors that were not detected and corrected.  Medication Adjustments/Labs and Tests Ordered: Current medicines are reviewed at length with the patient today.  Concerns regarding medicines are outlined above.  No orders of the defined types were placed in this encounter.  Meds ordered this encounter  Medications   spironolactone (ALDACTONE) 25 MG tablet    Sig: Take 1 tablet (25 mg total) by mouth daily.    Dispense:  30 tablet    Refill:  5    Patient Instructions  Medication Instructions:   Your physician has recommended you make the following change in your medication:    STOP taking your Entresto.  2.    START taking Spironolactone (Aldactone) 25 MG once a day.   *If you need a refill on your cardiac medications before your next appointment, please call your pharmacy*   Follow-Up: At [facility], you and your health needs are our priority.  As part of our continuing mission to provide you with exceptional heart care, we have created designated Provider Care Teams.  These Care Teams include your primary Cardiologist (physician) and Advanced Practice Providers (APPs -  Physician Assistants and Nurse Practitioners) who all work together to provide you with the care you need, when you need it.  We recommend signing up for the patient portal called "MyChart".  Sign up information is provided on this After Visit Summary.  MyChart is used to connect with patients for Virtual Visits (Telemedicine).  Patients are able to view lab/test results, encounter notes, upcoming appointments, etc.  Non-urgent messages can be sent to your provider as well.   To learn more about what you can do with MyChart, go to [email or web address].    Your next appointment:   3 month(s)  The format for your next appointment:    In Person  Provider:   Kate Sable, MD    Other Instructions   Important Information About Sugar         Signed, Kate Sable, MD  01/13/2022 4:04 PM    [place]

## 2022-01-13 NOTE — Patient Instructions (Signed)
Medication Instructions:   Your physician has recommended you make the following change in your medication:    STOP taking your Entresto.  2.    START taking Spironolactone (Aldactone) 25 MG once a day.   *If you need a refill on your cardiac medications before your next appointment, please call your pharmacy*   Follow-Up: At [facility], you and your health needs are our priority.  As part of our continuing mission to provide you with exceptional heart care, we have created designated Provider Care Teams.  These Care Teams include your primary Cardiologist (physician) and Advanced Practice Providers (APPs -  Physician Assistants and Nurse Practitioners) who all work together to provide you with the care you need, when you need it.  We recommend signing up for the patient portal called "MyChart".  Sign up information is provided on this After Visit Summary.  MyChart is used to connect with patients for Virtual Visits (Telemedicine).  Patients are able to view lab/test results, encounter notes, upcoming appointments, etc.  Non-urgent messages can be sent to your provider as well.   To learn more about what you can do with MyChart, go to [email or web address].    Your next appointment:   3 month(s)  The format for your next appointment:   In Person  Provider:   Kate Sable, MD    Other Instructions   Important Information About Sugar

## 2022-01-16 ENCOUNTER — Telehealth: Payer: Self-pay

## 2022-01-16 ENCOUNTER — Encounter: Payer: Self-pay | Admitting: Dermatology

## 2022-01-16 NOTE — Telephone Encounter (Signed)
Left pt VM to return my call. aw

## 2022-01-16 NOTE — Telephone Encounter (Signed)
Spoke to patient this morning. She states some warts were suppose to be frozen on her chest but was forgotten about.   Do you want me to bring this patient back in for another OV?

## 2022-01-16 NOTE — Telephone Encounter (Signed)
Yes, please. Thank you.

## 2022-01-18 ENCOUNTER — Ambulatory Visit (INDEPENDENT_AMBULATORY_CARE_PROVIDER_SITE_OTHER): Payer: Medicare Other | Admitting: Dermatology

## 2022-01-18 DIAGNOSIS — L570 Actinic keratosis: Secondary | ICD-10-CM

## 2022-01-18 NOTE — Patient Instructions (Signed)
Cryotherapy Aftercare  Wash gently with soap and water everyday.   Apply Vaseline and Band-Aid daily until healed.     Due to recent changes in healthcare laws, you may see results of your pathology and/or laboratory studies on MyChart before the doctors have had a chance to review them. We understand that in some cases there may be results that are confusing or concerning to you. Please understand that not all results are received at the same time and often the doctors may need to interpret multiple results in order to provide you with the best plan of care or course of treatment. Therefore, we ask that you please give us 2 business days to thoroughly review all your results before contacting the office for clarification. Should we see a critical lab result, you will be contacted sooner.   If You Need Anything After Your Visit  If you have any questions or concerns for your doctor, please call our main line at [phone] and press option 4 to reach your doctor's medical assistant. If no one answers, please leave a voicemail as directed and we will return your call as soon as possible. Messages left after 4 pm will be answered the following business day.   You may also send us a message via MyChart. We typically respond to MyChart messages within 1-2 business days.  For prescription refills, please ask your pharmacy to contact our office. Our fax number is [phone].  If you have an urgent issue when the clinic is closed that cannot wait until the next business day, you can page your doctor at the number below.    Please note that while we do our best to be available for urgent issues outside of office hours, we are not available 24/7.   If you have an urgent issue and are unable to reach us, you may choose to seek medical care at your doctor's office, retail clinic, urgent care center, or emergency room.  If you have a medical emergency, please immediately call 911 or go to the  emergency department.  Pager Numbers  - Dr. Kowalski: [phone]  - Dr. Moye: [phone]  - Dr. Stewart: [phone]  In the event of inclement weather, please call our main line at [phone] for an update on the status of any delays or closures.  Dermatology Medication Tips: Please keep the boxes that topical medications come in in order to help keep track of the instructions about where and how to use these. Pharmacies typically print the medication instructions only on the boxes and not directly on the medication tubes.   If your medication is too expensive, please contact our office at [phone] option 4 or send us a message through MyChart.   We are unable to tell what your co-pay for medications will be in advance as this is different depending on your insurance coverage. However, we may be able to find a substitute medication at lower cost or fill out paperwork to get insurance to cover a needed medication.   If a prior authorization is required to get your medication covered by your insurance company, please allow us 1-2 business days to complete this process.  Drug prices often vary depending on where the prescription is filled and some pharmacies may offer cheaper prices.  The website www.goodrx.com contains coupons for medications through different pharmacies. The prices here do not account for what the cost may be with help from insurance (it may be cheaper with your insurance), but the website can   give you the price if you did not use any insurance.  - You can print the associated coupon and take it with your prescription to the pharmacy.  - You may also stop by our office during regular business hours and pick up a GoodRx coupon card.  - If you need your prescription sent electronically to a different pharmacy, notify our office through [facility] MyChart or by phone at [phone] option 4.     Si Usted Necesita Algo Despus de Su Visita  Tambin puede  enviarnos un mensaje a travs de MyChart. Por lo general respondemos a los mensajes de MyChart en el transcurso de 1 a 2 das hbiles.  Para renovar recetas, por favor pida a su farmacia que se ponga en contacto con nuestra oficina. Nuestro nmero de fax es el [phone].  Si tiene un asunto urgente cuando la clnica est cerrada y que no puede esperar hasta el siguiente da hbil, puede llamar/localizar a su doctor(a) al nmero que aparece a continuacin.   Por favor, tenga en cuenta que aunque hacemos todo lo posible para estar disponibles para asuntos urgentes fuera del horario de oficina, no estamos disponibles las 24 horas del da, los 7 das de la semana.   Si tiene un problema urgente y no puede comunicarse con nosotros, puede optar por buscar atencin mdica  en el consultorio de su doctor(a), en una clnica privada, en un centro de atencin urgente o en una sala de emergencias.  Si tiene una emergencia mdica, por favor llame inmediatamente al 911 o vaya a la sala de emergencias.  Nmeros de bper  - Dr. Kowalski: [phone]  - Dra. Moye: [phone]  - Dra. Stewart: [phone]  En caso de inclemencias del tiempo, por favor llame a nuestra lnea principal al [phone] para una actualizacin sobre el estado de cualquier retraso o cierre.  Consejos para la medicacin en dermatologa: Por favor, guarde las cajas en las que vienen los medicamentos de uso tpico para ayudarle a seguir las instrucciones sobre dnde y cmo usarlos. Las farmacias generalmente imprimen las instrucciones del medicamento slo en las cajas y no directamente en los tubos del medicamento.   Si su medicamento es muy caro, por favor, pngase en contacto con nuestra oficina llamando al [phone] y presione la opcin 4 o envenos un mensaje a travs de MyChart.   No podemos decirle cul ser su copago por los medicamentos por adelantado ya que esto es diferente dependiendo de la cobertura de su seguro.  Sin embargo, es posible que podamos encontrar un medicamento sustituto a menor costo o llenar un formulario para que el seguro cubra el medicamento que se considera necesario.   Si se requiere una autorizacin previa para que su compaa de seguros cubra su medicamento, por favor permtanos de 1 a 2 das hbiles para completar este proceso.  Los precios de los medicamentos varan con frecuencia dependiendo del lugar de dnde se surte la receta y alguna farmacias pueden ofrecer precios ms baratos.  El sitio web www.goodrx.com tiene cupones para medicamentos de diferentes farmacias. Los precios aqu no tienen en cuenta lo que podra costar con la ayuda del seguro (puede ser ms barato con su seguro), pero el sitio web puede darle el precio si no utiliz ningn seguro.  - Puede imprimir el cupn correspondiente y llevarlo con su receta a la farmacia.  - Tambin puede pasar por nuestra oficina durante el horario de atencin regular y recoger una tarjeta de cupones de GoodRx.  -   Si necesita que su receta se enve electrnicamente a una farmacia diferente, informe a nuestra oficina a travs de MyChart de [place] o por telfono llamando al [phone] y presione la opcin 4.  

## 2022-01-18 NOTE — Progress Notes (Signed)
   Follow-Up Visit   Subjective  [name] [name] is a 66 y.o. female who presents for the following: Actinic Keratosis (Patient here today to treat AK's at chest. ).  The following portions of the chart were reviewed this encounter and updated as appropriate:   Tobacco  Allergies  Meds  Problems  Med Hx  Surg Hx  Fam Hx      Review of Systems:  No other skin or systemic complaints except as noted in HPI or Assessment and Plan.  Objective  Well appearing patient in no apparent distress; mood and affect are within normal limits.  A focused examination was performed including face, neck, chest. Relevant physical exam findings are noted in the Assessment and Plan.  chest (2) Erythematous thin papules/macules with gritty scale.     Assessment & Plan  AK (actinic keratosis) (2) chest  Actinic keratoses are precancerous spots that appear secondary to cumulative UV radiation exposure/sun exposure over time. They are chronic with expected duration over 1 year. A portion of actinic keratoses will progress to squamous cell carcinoma of the skin. It is not possible to reliably predict which spots will progress to skin cancer and so treatment is recommended to prevent development of skin cancer.  Recommend daily broad spectrum sunscreen SPF 30+ to sun-exposed areas, reapply every 2 hours as needed.  Recommend staying in the shade or wearing long sleeves, sun glasses (UVA+UVB protection) and wide brim hats (4-inch brim around the entire circumference of the hat). Call for new or changing lesions.  Prior to procedure, discussed risks of blister formation, small wound, skin dyspigmentation, or rare scar following cryotherapy. Recommend Vaseline ointment to treated areas while healing.   Destruction of lesion - chest  Destruction method: cryotherapy   Informed consent: discussed and consent obtained   Lesion destroyed using liquid nitrogen: Yes   Cryotherapy cycles:  2 Outcome: patient  tolerated procedure well with no complications   Post-procedure details: wound care instructions given      Return for as scheduled.  Graciella Belton, RMA, am acting as scribe for Forest Gleason, MD .   Documentation: I have reviewed the above documentation for accuracy and completeness, and I agree with the above.  Forest Gleason, MD

## 2022-01-23 ENCOUNTER — Encounter: Payer: Self-pay | Admitting: Dermatology

## 2022-01-25 ENCOUNTER — Ambulatory Visit (INDEPENDENT_AMBULATORY_CARE_PROVIDER_SITE_OTHER): Payer: Medicare Other | Admitting: Internal Medicine

## 2022-01-25 ENCOUNTER — Encounter: Payer: Self-pay | Admitting: Internal Medicine

## 2022-01-25 VITALS — BP 120/60 | HR 82 | Temp 97.8°F | Ht 63.0 in | Wt 246.0 lb

## 2022-01-25 DIAGNOSIS — G4733 Obstructive sleep apnea (adult) (pediatric): Secondary | ICD-10-CM | POA: Diagnosis not present

## 2022-01-25 DIAGNOSIS — J449 Chronic obstructive pulmonary disease, unspecified: Secondary | ICD-10-CM

## 2022-01-25 MED ORDER — DOXYCYCLINE HYCLATE 50 MG PO CAPS: 50.0000 mg | ORAL_CAPSULE | Freq: Two times a day (BID) | ORAL | 1 refills | Status: DC

## 2022-01-25 MED ORDER — IPRATROPIUM-ALBUTEROL 0.5-2.5 (3) MG/3ML IN SOLN: 3.0000 mL | Freq: Four times a day (QID) | RESPIRATORY_TRACT | 10 refills | Status: DC | PRN

## 2022-01-25 MED ORDER — PREDNISONE 20 MG PO TABS: 20.0000 mg | ORAL_TABLET | Freq: Every day | ORAL | 1 refills | Status: DC

## 2022-01-25 NOTE — Patient Instructions (Addendum)
Continue CPAP as prescribed Continue inhalers as prescribed Continue oxygen as prescribed  Doxycyline  as prescribed Prednisone as prescribed

## 2022-01-25 NOTE — Progress Notes (Signed)
SYNOPSIS [name] [name] is a 66 y.o. female former smoker ( 35 pack year history , quit 1995 ) with history of  COPD , allergies, and sleep apnea per in lab sleep study done 4-5 years ago. She had a CPAP machine, but she could not tolerate it. Consult to evaluate if pt. Is still + for OSA.    CC  Follow-up OSA Follow-up COPD   HPI Regarding COPD Stable with inhalers +EXACERBATION AT THIS TIME CHANGE IN SPUTUM PRODUCTION +WHEEZING No evidence of heart failure at this time No evidence or signs of infection at this time No respiratory distress No fevers, chills, nausea, vomiting, diarrhea No evidence of lower extremity edema No evidence hemoptysis  Severe OSA Excellent compliance 100% for days and greater than 4 hours AHI reduced to 1.5 previous AHI 60    Past Medical History:  Diagnosis Date   Allergic rhinitis    Allergy    Asthma    Back pain    CHF (congestive heart failure) (HCC)    COPD (chronic obstructive pulmonary disease) (HCC)    Degenerative joint disease of knee, left    Depression    Diabetes mellitus without complication (HCC)    Edema    Elevated lipids    GERD (gastroesophageal reflux disease)    Hidradenitis    History of colonic polyps    Hypertension    Hypothyroidism    IBS (irritable bowel syndrome)    Insomnia    Neuropathy    feet and legs   Obesity    Oxygen dependent    Pneumonia    PONV (postoperative nausea and vomiting)    PVD (peripheral vascular disease) (HCC)    Sinusitis, chronic    Sleep apnea    Thyroid disease    Vaginitis, atrophic    Vertigo    Vitamin D deficiency       Test Results: 05/2016 Echo Borderline left ventricle systolic function with left ventricle    ejection fraction approximately 50% visually although quality of    the study was suboptimal. There is wall motion abnormality    suggestive of coronary artery disease and mild diastolic    dysfunction.   Sleep Study AHI 60       Latest Ref  Rng & Units 10/07/2019    2:16 PM 09/29/2019    5:52 PM 07/15/2019    3:46 PM  CBC  WBC 4.0 - 10.5 K/uL 21.4  14.4  14.6   Hemoglobin 12.0 - 15.0 g/dL 13.8  12.6  13.6   Hematocrit 36.0 - 46.0 % 41.9  39.5  43.0   Platelets 150 - 400 K/uL 275  262  307        Latest Ref Rng & Units 05/12/2021    3:18 PM 01/17/2021   11:33 AM 09/29/2019    5:52 PM  BMP  Glucose 70 - 99 mg/dL 195  155  158   BUN 8 - 23 mg/dL 33  18  12   Creatinine 0.44 - 1.00 mg/dL 1.16  1.08  0.73   Sodium 135 - 145 mmol/L 132  137  137   Potassium 3.5 - 5.1 mmol/L 4.4  4.3  4.3   Chloride 98 - 111 mmol/L 98  101  101   CO2 22 - 32 mmol/L _0 Calcium 8.9 - 10.3 mg/dL 9.2  9.3  9.2     BNP    Component Value Date/Time  BNP 36.0 05/20/2016 2155    ProBNP No results found for: "PROBNP"  PFT No results found for: "FEV1PRE", "FEV1POST", "FVCPRE", "FVCPOST", "TLC", "DLCOUNC", "PREFEV1FVCRT", "PSTFEV1FVCRT"  MM DIAG BREAST TOMO UNI RIGHT  Result Date: 12/28/2021 CLINICAL DATA:  Callback for RIGHT breast calcifications. History of cysts. EXAM: DIGITAL DIAGNOSTIC UNILATERAL RIGHT MAMMOGRAM WITH TOMOSYNTHESIS TECHNIQUE: Right digital diagnostic mammography and breast tomosynthesis was performed. COMPARISON:  Previous exam(s). ACR Breast Density Category b: There are scattered areas of fibroglandular density. FINDINGS: Spot magnification views of the RIGHT breast demonstrate 2 adjacent groups of similar appearing amorphous calcifications in the RIGHT upper outer breast at middle to posterior depth. Dominant group spans approximately 16 mm; it is better appreciated on the true lateral. These are not definitively stable in comparison to prior mammograms. There is a smaller group best delineated on true lateral inferior from the dominant group which spans approximately 5 mm. In total, this area spans approximately 2.4 cm. IMPRESSION: There is a 16 mm group of calcifications in the RIGHT upper outer breast at middle  to posterior depth which are indeterminate. Recommend stereotactic guided biopsy for definitive characterization. RECOMMENDATION: RIGHT breast stereotactic guided biopsy x1 I have discussed the findings and recommendations with the patient. The biopsy procedure was discussed with the patient and questions were answered. Patient expressed their understanding of the biopsy recommendation. Patient will be scheduled for biopsy at her earliest convenience by the schedulers. Ordering provider will be notified. If applicable, a reminder letter will be sent to the patient regarding the next appointment. BI-RADS CATEGORY  4: Suspicious. Electronically Signed   By: Valentino Saxon M.D.   On: 12/28/2021 16:50    Past medical hx Past Medical History:  Diagnosis Date   Allergic rhinitis    Allergy    Asthma    Back pain    CHF (congestive heart failure) (HCC)    COPD (chronic obstructive pulmonary disease) (HCC)    Degenerative joint disease of knee, left    Depression    Diabetes mellitus without complication (HCC)    Edema    Elevated lipids    GERD (gastroesophageal reflux disease)    Hidradenitis    History of colonic polyps    Hypertension    Hypothyroidism    IBS (irritable bowel syndrome)    Insomnia    Neuropathy    feet and legs   Obesity    Oxygen dependent    Pneumonia    PONV (postoperative nausea and vomiting)    PVD (peripheral vascular disease) (HCC)    Sinusitis, chronic    Sleep apnea    Thyroid disease    Vaginitis, atrophic    Vertigo    Vitamin D deficiency      Social History   Tobacco Use   Smoking status: Former    Packs/day: 3.00    Years: 20.00    Total pack years: 60.00    Types: Cigarettes    Quit date: 12/18/1993    Years since quitting: 28.1   Smokeless tobacco: Never  Vaping Use   Vaping Use: Never used  Substance Use Topics   Alcohol use: No   Drug use: No    [name] [name] reports that she quit smoking about 28 years ago. Her smoking use  included cigarettes. She has a 60.00 pack-year smoking history. She has never used smokeless tobacco. She reports that she does not drink alcohol and does not use drugs.  Tobacco Cessation: Former smoker with a 35 pack year smoking history  Past surgical hx, Family hx, Social hx all reviewed.  Current Outpatient Medications on File Prior to Visit  Medication Sig   acetaminophen (TYLENOL) 500 MG tablet Take 500 mg by mouth every 6 (six) hours as needed. (Patient not taking: Reported on 01/12/2022)   albuterol (PROVENTIL HFA;VENTOLIN HFA) 108 (90 Base) MCG/ACT inhaler Inhale 2 puffs into the lungs every 6 (six) hours as needed for wheezing or shortness of breath.   aspirin EC 81 MG tablet Take 81 mg by mouth daily.   atorvastatin (LIPITOR) 20 MG tablet Take 20 mg by mouth at bedtime.    budesonide-formoterol (SYMBICORT) 160-4.5 MCG/ACT inhaler Inhale 2 puffs into the lungs 2 (two) times daily.   cetirizine (ZYRTEC) 10 MG tablet Take 10 mg by mouth daily. (Patient not taking: Reported on 01/13/2022)   clindamycin (CLINDAGEL) 1 % gel Apply to aa's under arms QD.   clobetasol (OLUX) 0.05 % topical foam Apply to aa's psoriasis on the scalp and body BID up to two weeks PRN. Avoid applying to face, groin, and axilla. Use as directed. Long-term use can cause thinning of the skin.   cyclobenzaprine (FLEXERIL) 10 MG tablet Take 10 mg by mouth at bedtime.   doxepin (SINEQUAN) 50 MG capsule Take 50 mg by mouth at bedtime.   DULoxetine (CYMBALTA) 20 MG capsule Take 40 mg by mouth daily.   empagliflozin (JARDIANCE) 10 MG TABS tablet Take 25 mg by mouth daily.   fluticasone (FLONASE) 50 MCG/ACT nasal spray    gabapentin (NEURONTIN) 300 MG capsule Take 600 mg by mouth 2 (two) times daily.   glucose blood test strip TEST three times a day   ibuprofen (ADVIL,MOTRIN) 800 MG tablet Take 1 tablet (800 mg total) by mouth every 8 (eight) hours as needed for mild pain or moderate pain (with food). (Patient not  taking: Reported on 01/12/2022)   ipratropium (ATROVENT HFA) 17 MCG/ACT inhaler Inhale 2 puffs into the lungs every 6 (six) hours as needed for wheezing. Patient uses once a day   ketoconazole (NIZORAL) 2 % shampoo Shampoo into scalp let sit 10 minutes then wash out. Use 3d/wk.   Lancets Misc. (ACCU-CHEK FASTCLIX LANCET) KIT    levocetirizine (XYZAL) 5 MG tablet TAKE 1 TABLET BY MOUTH EVERY EVENING   levothyroxine (SYNTHROID, LEVOTHROID) 200 MCG tablet Take 175 mcg by mouth daily before breakfast.   meclizine (ANTIVERT) 25 MG tablet Take 25 mg by mouth 3 (three) times daily as needed for dizziness.   Melatonin 5 MG CAPS Take 2 capsules by mouth daily.   mupirocin ointment (BACTROBAN) 2 % APPLY TOPICALLY EVERY DAY WITH DRESSING CHANGES (Patient not taking: Reported on 01/12/2022)   olopatadine (PATANOL) 0.1 % ophthalmic solution Place 1 drop into both eyes 2 (two) times daily.   omeprazole (PRILOSEC) 40 MG capsule Take 40 mg by mouth daily.   OXYGEN Inhale into the lungs. With CPAP   potassium citrate (UROCIT-K) 10 MEQ (1080 MG) SR tablet Take 1 tablet (10 mEq total) by mouth every morning.   roflumilast (DALIRESP) 500 MCG TABS tablet Take 500 mcg by mouth daily.   Semaglutide,0.25 or 0.5MG/DOS, (OZEMPIC, 0.25 OR 0.5 MG/DOSE,) 2 MG/1.5ML SOPN Inject into the skin once a week.   spironolactone (ALDACTONE) 25 MG tablet Take 1 tablet (25 mg total) by mouth daily.   torsemide (DEMADEX) 20 MG tablet TAKE ONE TABLET BY MOUTH DAILY   traZODone (DESYREL) 50 MG tablet Take 50-100 mg by mouth at bedtime as needed for sleep.  No current facility-administered medications on file prior to visit.     Allergies  Allergen Reactions   Codeine Itching   Levofloxacin Rash    "welts" & itching       Vital Signs There were no vitals taken for this visit.      Review of Systems: Gen:  Denies  fever, sweats, chills weight loss  HEENT: Denies blurred vision, double vision, ear pain, eye pain,  hearing loss, nose bleeds, sore throat Cardiac:  No dizziness, chest pain or heaviness, chest tightness,edema, No JVD Resp:   No cough, -sputum production, -shortness of breath,-wheezing, -hemoptysis,  Other:  All other systems negative    Physical Examination:   General Appearance: No distress  EYES PERRLA, EOM intact.   NECK Supple, No JVD Pulmonary: normal breath sounds, No wheezing.  CardiovascularNormal S1,S2.  No m/r/g.   Abdomen: Benign, Soft, non-tender. ALL OTHER ROS ARE NEGATIVE   Assessment/Plan   66 year old obese white female with severe OSA with AHI of 60 with chronic hypoxic respiratory failure in the setting of COPD   Follow-up assessment for sleep apnea Severe OSA AHI of 60 Auto CPAP 8 to 18 cm of water pressure Excellent compliance with CPAP 100% for days and greater than 4 hours AHI reduced to 1.5  COPD+exacerbation at this time Continue inhalers as prescribed Rinse mouth out after use Will prescribe Doxy and Prednisone  Chronic Hypoxic resp failure due to COPD -recommend using oxygen as prescribed -patient needs this for survival    COPD In the Symbicort as prescribed Rinse mouth after use Albuterol as needed  Obesity -recommend significant weight loss -recommend changing diet  Deconditioned state -Recommend increased daily activity and exercise     MEDICATION ADJUSTMENTS/LABS AND TESTS ORDERED: Continue CPAP as prescribed Continue inhalers as prescribed Continue oxygen as prescribed  Doxycyline  as prescribed Prednisone as prescribed  Patient satisfied with Plan of action and management. All questions answered  Follow-up 1 year  Time spent 22 minutes   Van[place]ope [place]more Patricia Pesa, M.D.  [facility]  Medical Director Ricardo Director [facility]io-Pulmonary Department

## 2022-01-31 ENCOUNTER — Inpatient Hospital Stay: Admission: RE | Admit: 2022-01-31 | Payer: Medicare Other | Source: Ambulatory Visit

## 2022-02-04 ENCOUNTER — Other Ambulatory Visit: Payer: Self-pay | Admitting: Internal Medicine

## 2022-02-04 DIAGNOSIS — J449 Chronic obstructive pulmonary disease, unspecified: Secondary | ICD-10-CM

## 2022-02-06 ENCOUNTER — Ambulatory Visit: Payer: Medicare Other | Admitting: Internal Medicine

## 2022-02-06 ENCOUNTER — Other Ambulatory Visit: Payer: Self-pay | Admitting: Family

## 2022-02-06 DIAGNOSIS — I5032 Chronic diastolic (congestive) heart failure: Secondary | ICD-10-CM

## 2022-02-12 NOTE — Progress Notes (Signed)
Patient ID: [name] [name], female    DOB: [birth year], 66 y.o.   MRN: [id number]   [name] [name] is a 66 y/o female with a history of vitamin D deficiency, obstructive sleep apnea, GERD, pneumonia, PVD, DM, depression, DJD, COPD with oxygen, allergies, past tobacco use and chronic heart failure.   Echo report on 04/04/21 reviewed and showed an EF of 60-65%, Left atrial size was moderately dilated. Echo report on 06/06/19 reviewed and showed an EF of 60-65%. Echo done on 05/21/16 and showed an EF of 50% along with trivial MR/TR/PR. EF has declined slightly from June 2017.  Has not been admitted or been in the ED in the last 6 months.   She presents today for a follow up appointment with a chief complaint of minimal fatigue upon moderate exertion. Describes this as chronic in nature having been present for several years. She has associated shortness of breath, pedal edema (left lower leg), light-headedness (all the time) & knee pain along with this. She denies any difficulty sleeping, abdominal distention, palpitations, chest pain, wheezing, cough or weight gain.   Says that she fell twice today getting out of the bathtub. She says that her feet were still wet when she stepped on the bathmat but slipped when she then stepped onto a towel that she has beside the bathmat leading towards the door. Denies losing consciousness or hitting her head. Says that she fell onto her knees.   Says that she knows that she's drinking too much fluids but that her mouth stays dry "all the time". She says that she drinks 4-6 water bottles daily along with apple juice.   She is unsure if she's still taking jardiance or not.   Past Medical History:  Diagnosis Date   Allergic rhinitis    Allergy    Asthma    Back pain    CHF (congestive heart failure) (HCC)    COPD (chronic obstructive pulmonary disease) (HCC)    Degenerative joint disease of knee, left    Depression    Diabetes mellitus without complication (HCC)     Edema    Elevated lipids    GERD (gastroesophageal reflux disease)    Hidradenitis    History of colonic polyps    Hypertension    Hypothyroidism    IBS (irritable bowel syndrome)    Insomnia    Neuropathy    feet and legs   Obesity    Oxygen dependent    Pneumonia    PONV (postoperative nausea and vomiting)    PVD (peripheral vascular disease) (HCC)    Sinusitis, chronic    Sleep apnea    Thyroid disease    Vaginitis, atrophic    Vertigo    Vitamin D deficiency    Past Surgical History:  Procedure Laterality Date   ABDOMINAL HYSTERECTOMY     AXILLARY HIDRADENITIS EXCISION     BREAST EXCISIONAL BIOPSY Bilateral 48/1856   RUPTURED FOLLICULAR CYSTS WITH ABSCESSES AND SCARRING, CONSISTENT WITH HIDRADENITIS SUPPURATIVA.    CATARACT EXTRACTION W/PHACO Right 07/27/2020   Procedure: CATARACT EXTRACTION PHACO AND INTRAOCULAR LENS PLACEMENT (IOC) RIGHT DIABETIC 17.13 01:23.9;  Surgeon: Birder Robson, MD;  Location: [place];  Service: Ophthalmology;  Laterality: Right;  Diabetic - insulin  sleep apnea wants to be last   CESAREAN SECTION     x 2   CHOLECYSTECTOMY     COLONOSCOPY WITH PROPOFOL N/A 09/15/2021   Procedure: COLONOSCOPY WITH PROPOFOL;  Surgeon: Lin Landsman, MD;  Location: [facility];  Service: Gastroenterology;  Laterality: N/A;   HEEL SPUR EXCISION N/A    HYDRADENITIS EXCISION Right 12/31/2015   Procedure: EXCISION HIDRADENITIS AXILLA;  Surgeon: Clayburn Pert, MD;  Location: [facility];  Service: General;  Laterality: Right;   KNEE SURGERY Left    1998   TONSILLECTOMY     Family History  Problem Relation Age of Onset   Rashes / Skin problems Father    Hypertension Father    Heart disease Father    Breast cancer Paternal Grandmother    COPD Mother    Kidney cancer Neg Hx    Bladder Cancer Neg Hx    Social History   Tobacco Use   Smoking status: Former    Packs/day: 3.00    Years: 20.00    Total pack years: 60.00    Types:  Cigarettes    Quit date: 12/18/1993    Years since quitting: 28.1   Smokeless tobacco: Never  Substance Use Topics   Alcohol use: No   Allergies  Allergen Reactions   Codeine Itching   Levofloxacin Rash    "welts" & itching   Prior to Admission medications   Medication Sig Start Date End Date Taking? Authorizing Provider  acetaminophen (TYLENOL) 500 MG tablet Take 500 mg by mouth every 6 (six) hours as needed.   Yes [provider]  albuterol (PROVENTIL HFA;VENTOLIN HFA) 108 (90 Base) MCG/ACT inhaler Inhale 2 puffs into the lungs every 6 (six) hours as needed for wheezing or shortness of breath.   Yes [provider]  aspirin EC 81 MG tablet Take 81 mg by mouth daily.   Yes [provider]  atorvastatin (LIPITOR) 20 MG tablet Take 20 mg by mouth at bedtime.    Yes [provider]  budesonide-formoterol (SYMBICORT) 160-4.5 MCG/ACT inhaler Inhale 2 puffs into the lungs 2 (two) times daily.   Yes [provider]  cetirizine (ZYRTEC) 10 MG tablet Take 10 mg by mouth daily.   Yes [provider]  clindamycin (CLINDAGEL) 1 % gel Apply to aa's under arms QD. 01/12/22  Yes Moye, [place], MD  clobetasol (OLUX) 0.05 % topical foam Apply to aa's psoriasis on the scalp and body BID up to two weeks PRN. Avoid applying to face, groin, and axilla. Use as directed. Long-term use can cause thinning of the skin. 01/12/22  Yes Moye, [place], MD  cyclobenzaprine (FLEXERIL) 10 MG tablet Take 10 mg by mouth at bedtime.   Yes [provider]  doxepin (SINEQUAN) 50 MG capsule Take 50 mg by mouth at bedtime.   Yes [provider]  DULoxetine (CYMBALTA) 20 MG capsule Take 40 mg by mouth daily.   Yes [provider]  fluticasone Asencion Islam) 50 MCG/ACT nasal spray  06/24/18  Yes [provider]  gabapentin (NEURONTIN) 300 MG capsule Take 600 mg by mouth 2 (two) times daily. 03/25/17  Yes [provider]  glucose blood test  strip TEST three times a day 12/30/15  Yes [provider]  ibuprofen (ADVIL,MOTRIN) 800 MG tablet Take 1 tablet (800 mg total) by mouth every 8 (eight) hours as needed for mild pain or moderate pain (with food). 12/21/16  Yes Eula Listen, MD  ipratropium (ATROVENT HFA) 17 MCG/ACT inhaler Inhale 2 puffs into the lungs every 6 (six) hours as needed for wheezing. Patient uses once a day   Yes [provider]  ipratropium-albuterol (DUONEB) 0.5-2.5 (3) MG/3ML SOLN Take 3 mLs by nebulization every 6 (six)  hours as needed. 01/25/22  Yes Kasa, Maretta Bees, MD  ketoconazole (NIZORAL) 2 % shampoo Shampoo into scalp let sit 10 minutes then wash out. Use 3d/wk. 01/12/22  Yes Moye, [place], MD  Lancets Misc. (ACCU-CHEK FASTCLIX LANCET) KIT  12/27/15  Yes [provider]  levocetirizine (XYZAL) 5 MG tablet TAKE 1 TABLET BY MOUTH EVERY EVENING 12/13/21  Yes Moye, [place], MD  levothyroxine (SYNTHROID, LEVOTHROID) 200 MCG tablet Take 175 mcg by mouth daily before breakfast.   Yes [provider]  meclizine (ANTIVERT) 25 MG tablet Take 25 mg by mouth 3 (three) times daily as needed for dizziness.   Yes [provider]  Melatonin 5 MG CAPS Take 2 capsules by mouth daily.   Yes [provider]  olopatadine (PATANOL) 0.1 % ophthalmic solution Place 1 drop into both eyes 2 (two) times daily.   Yes [provider]  omeprazole (PRILOSEC) 40 MG capsule Take 40 mg by mouth daily.   Yes [provider]  OXYGEN Inhale into the lungs. With CPAP   Yes [provider]  potassium citrate (UROCIT-K) 10 MEQ (1080 MG) SR tablet TAKE ONE TABLET BY MOUTH EVERY MORNING 02/06/22  Yes Brazen Domangue A, FNP  roflumilast (DALIRESP) 500 MCG TABS tablet Take 500 mcg by mouth daily.   Yes [provider]  Semaglutide,0.25 or 0.5MG/DOS, (OZEMPIC, 0.25 OR 0.5 MG/DOSE,) 2 MG/1.5ML SOPN Inject into the skin once a week.   Yes [provider]   spironolactone (ALDACTONE) 25 MG tablet Take 1 tablet (25 mg total) by mouth daily. 01/13/22  Yes Kate Sable, MD  torsemide (DEMADEX) 20 MG tablet TAKE ONE TABLET BY MOUTH DAILY 12/12/21  Yes Darylene Price A, FNP  traZODone (DESYREL) 50 MG tablet Take 50-100 mg by mouth at bedtime as needed for sleep.    Yes [provider]  empagliflozin (JARDIANCE) 10 MG TABS tablet Take 25 mg by mouth daily. Patient not taking: Reported on 02/13/2022    [provider]  mupirocin ointment (BACTROBAN) 2 % APPLY TOPICALLY EVERY DAY WITH DRESSING CHANGES Patient not taking: Reported on 02/13/2022 10/10/21   Alfonso Patten, MD   Review of Systems  Constitutional:  Positive for fatigue. Negative for appetite change.  HENT:  Negative for congestion, rhinorrhea and sore throat.   Eyes: Negative.   Respiratory:  Positive for shortness of breath (with moderate exertion). Negative for cough, chest tightness and wheezing.   Cardiovascular:  Positive for leg swelling (left lower leg). Negative for chest pain and palpitations.  Gastrointestinal:  Negative for abdominal distention and abdominal pain.  Endocrine: Negative.   Genitourinary: Negative.   Musculoskeletal:  Positive for arthralgias (knee pain). Negative for back pain and neck pain.  Skin: Negative.   Allergic/Immunologic: Negative.   Neurological:  Positive for light-headedness (with vertigo). Negative for dizziness.  Hematological:  Negative for adenopathy. Bruises/bleeds easily.  Psychiatric/Behavioral:  Negative for dysphoric mood and sleep disturbance (sleeping on 1 pillow with CPAP/ oxygen). The patient is not nervous/anxious.    Vitals:   02/13/22 1256  BP: (!) 139/50  Pulse: 88  Resp: 20  SpO2: 96%  Weight: 250 lb (113.4 kg)   Wt Readings from Last 3 Encounters:  02/13/22 250 lb (113.4 kg)  01/25/22 246 lb (111.6 kg)  01/13/22 249 lb 9.6 oz (113.2 kg)   Lab Results  Component Value Date   CREATININE 1.16 (H)  05/12/2021   CREATININE 1.08 (H) 01/17/2021   CREATININE 0.73 09/29/2019   Physical Exam Vitals  and nursing note reviewed.  Constitutional:      General: She is not in acute distress.    Appearance: She is well-developed. She is obese.  HENT:     Head: Normocephalic and atraumatic.  Neck:     Vascular: No JVD.  Cardiovascular:     Rate and Rhythm: Normal rate and regular rhythm.     Pulses: Normal pulses.  Pulmonary:     Effort: Pulmonary effort is normal.     Breath sounds: No wheezing or rales.  Abdominal:     General: There is no distension.     Palpations: Abdomen is soft.     Tenderness: There is no abdominal tenderness.  Musculoskeletal:        General: No tenderness.     Cervical back: Normal range of motion and neck supple.     Right lower leg: No tenderness. No edema.     Left lower leg: No tenderness. Edema (trace pitting) present.  Skin:    General: Skin is warm and dry.     Findings: Ecchymosis (both knees & arms & under right eye) present.  Neurological:     Mental Status: She is alert and oriented to person, place, and time.  Psychiatric:        Behavior: Behavior normal.        Thought Content: Thought content normal.   Assessment & Plan:  1: Chronic heart failure with preserved ejection fraction with structural changes- - NYHA class II - euvolemic today - weighing daily; reminded to call for an overnight weight gain of 2 lbs or a weekly weight gain of 5 lbs - weight unchanged from last visit here 7 months ago - not adding salt and is using a salt substitute. Using meals on wheels for food; reminded her to follow a 2000 mg sodium diet - saw cardiology ([place]) 01/13/22 - she is unsure if she's still taking jardiance; she says that she will call back after she looks when she returns home - drinking 64-96 ounces of water daily along with apple juice; reviewed the importance of keeping daily fluid intake to 60-64 ounces total and to talk with PCP about  options for her antidepressant that may have less effect on her dry mouth - entresto has been stopped due to hypotension - wearing CPAP nightly - BNP 05/20/16 was 36.0  2: HTN- - BP looks good (139/50) - follows with PCP at [facility] from 05/12/21 reviewed and showed sodium 132, potassium 4.4, creatinine 1.16 and GFR 53  3: Diabetes- - A1C on 05/21/16 was 7.0% - saw endocrinologist ([place]) 09/12/17   4: COPD- - confirms rinsing/ spitting after using her inhalers - wears oxygen at 2L at bedtime - saw pulmonologist ([place]) 01/25/22 - wearing CPAP nightly  Had 2 falls at home earlier today after stepping out of the tub onto a bathmat but then onto a towel that is laying on the floor. Emphasized that she pick up the towel and that she should only have a mat that has a nonstick surface to the back of it. She could also use rubber soled bath shoes/ crocs that were nonslip   Patient did not bring her medications nor a list. Each medication was verbally reviewed with the patient and she was encouraged to bring the bottles to every visit to confirm accuracy of list.  Return in 6 months, sooner if needed.

## 2022-02-13 ENCOUNTER — Encounter: Payer: Self-pay | Admitting: Family

## 2022-02-13 ENCOUNTER — Ambulatory Visit: Payer: Medicare Other | Attending: Family | Admitting: Family

## 2022-02-13 VITALS — BP 139/50 | HR 88 | Resp 20 | Wt 250.0 lb

## 2022-02-13 DIAGNOSIS — M25569 Pain in unspecified knee: Secondary | ICD-10-CM | POA: Insufficient documentation

## 2022-02-13 DIAGNOSIS — R5383 Other fatigue: Secondary | ICD-10-CM | POA: Insufficient documentation

## 2022-02-13 DIAGNOSIS — J449 Chronic obstructive pulmonary disease, unspecified: Secondary | ICD-10-CM | POA: Diagnosis not present

## 2022-02-13 DIAGNOSIS — Z87891 Personal history of nicotine dependence: Secondary | ICD-10-CM | POA: Insufficient documentation

## 2022-02-13 DIAGNOSIS — R0602 Shortness of breath: Secondary | ICD-10-CM | POA: Diagnosis not present

## 2022-02-13 DIAGNOSIS — Z794 Long term (current) use of insulin: Secondary | ICD-10-CM

## 2022-02-13 DIAGNOSIS — R682 Dry mouth, unspecified: Secondary | ICD-10-CM | POA: Insufficient documentation

## 2022-02-13 DIAGNOSIS — G4733 Obstructive sleep apnea (adult) (pediatric): Secondary | ICD-10-CM | POA: Diagnosis not present

## 2022-02-13 DIAGNOSIS — E119 Type 2 diabetes mellitus without complications: Secondary | ICD-10-CM

## 2022-02-13 DIAGNOSIS — Z9981 Dependence on supplemental oxygen: Secondary | ICD-10-CM | POA: Diagnosis not present

## 2022-02-13 DIAGNOSIS — E559 Vitamin D deficiency, unspecified: Secondary | ICD-10-CM | POA: Insufficient documentation

## 2022-02-13 DIAGNOSIS — I10 Essential (primary) hypertension: Secondary | ICD-10-CM | POA: Diagnosis not present

## 2022-02-13 DIAGNOSIS — R609 Edema, unspecified: Secondary | ICD-10-CM | POA: Diagnosis not present

## 2022-02-13 DIAGNOSIS — I5032 Chronic diastolic (congestive) heart failure: Secondary | ICD-10-CM | POA: Insufficient documentation

## 2022-02-13 DIAGNOSIS — K219 Gastro-esophageal reflux disease without esophagitis: Secondary | ICD-10-CM | POA: Diagnosis not present

## 2022-02-13 DIAGNOSIS — I110 Hypertensive heart disease with heart failure: Secondary | ICD-10-CM | POA: Insufficient documentation

## 2022-02-13 DIAGNOSIS — R42 Dizziness and giddiness: Secondary | ICD-10-CM | POA: Diagnosis not present

## 2022-02-13 NOTE — Patient Instructions (Addendum)
Continue weighing daily and call for an overnight weight gain of 3 pounds or more or a weekly weight gain of more than 5 pounds.   If you have voicemail, please make sure your mailbox is cleaned out so that we may leave a message and please make sure to listen to any voicemails.    If you receive a satisfaction survey regarding the [facility], please take the time to fill it out. This way we can continue to provide excellent care and make any changes that need to be made.    Decrease your fluid intake to closer to 64 ounces daily (4 bottles of water)

## 2022-02-22 ENCOUNTER — Ambulatory Visit
Admission: RE | Admit: 2022-02-22 | Discharge: 2022-02-22 | Disposition: A | Discharge status: Home / Self Care | Payer: Medicare Other | Source: Ambulatory Visit | Attending: Family Medicine | Admitting: Family Medicine

## 2022-02-22 DIAGNOSIS — R928 Other abnormal and inconclusive findings on diagnostic imaging of breast: Secondary | ICD-10-CM | POA: Diagnosis present

## 2022-02-22 DIAGNOSIS — R921 Mammographic calcification found on diagnostic imaging of breast: Secondary | ICD-10-CM | POA: Diagnosis present

## 2022-02-22 HISTORY — PX: BREAST BIOPSY: SHX20

## 2022-02-22 MED ORDER — LIDOCAINE HCL (PF) 1 % IJ SOLN
5.0000 mL | Freq: Once | INTRAMUSCULAR | Status: AC
  Administered 2022-02-22: 5 mL

## 2022-02-22 MED ORDER — LIDOCAINE-EPINEPHRINE 1 %-1:100000 IJ SOLN
10.0000 mL | Freq: Once | INTRAMUSCULAR | Status: AC
  Administered 2022-02-22: 10 mL

## 2022-02-23 LAB — SURGICAL PATHOLOGY

## 2022-02-27 ENCOUNTER — Ambulatory Visit: Payer: Medicare Other

## 2022-02-27 NOTE — Therapy (Deleted)
OUTPATIENT PHYSICAL THERAPY VESTIBULAR EVALUATION     Patient Name: [name] [name] MRN: [id number] DOB:11/21/1955, 66 y.o., female Today's Date: 02/27/2022  END OF SESSION:   Past Medical History:  Diagnosis Date   Allergic rhinitis    Allergy    Asthma    Back pain    CHF (congestive heart failure) (HCC)    COPD (chronic obstructive pulmonary disease) (HCC)    Degenerative joint disease of knee, left    Depression    Diabetes mellitus without complication (HCC)    Edema    Elevated lipids    GERD (gastroesophageal reflux disease)    Hidradenitis    History of colonic polyps    Hypertension    Hypothyroidism    IBS (irritable bowel syndrome)    Insomnia    Neuropathy    feet and legs   Obesity    Oxygen dependent    Pneumonia    PONV (postoperative nausea and vomiting)    PVD (peripheral vascular disease) (HCC)    Sinusitis, chronic    Sleep apnea    Thyroid disease    Vaginitis, atrophic    Vertigo    Vitamin D deficiency    Past Surgical History:  Procedure Laterality Date   ABDOMINAL HYSTERECTOMY     AXILLARY HIDRADENITIS EXCISION     BREAST BIOPSY Right 02/22/2022   stereo bx, calcs, "X" clip- path pending   BREAST BIOPSY Right 02/22/2022   MM RT BREAST BX W LOC DEV 1ST LESION IMAGE BX SPEC STEREO GUIDE 02/22/2022 [facility]   BREAST EXCISIONAL BIOPSY Bilateral 95/0932   RUPTURED FOLLICULAR CYSTS WITH ABSCESSES AND SCARRING, CONSISTENT WITH HIDRADENITIS SUPPURATIVA.    CATARACT EXTRACTION W/PHACO Right 07/27/2020   Procedure: CATARACT EXTRACTION PHACO AND INTRAOCULAR LENS PLACEMENT (IOC) RIGHT DIABETIC 17.13 01:23.9;  Surgeon: Birder Robson, MD;  Location: [place];  Service: Ophthalmology;  Laterality: Right;  Diabetic - insulin  sleep apnea wants to be last   CESAREAN SECTION     x 2   CHOLECYSTECTOMY     COLONOSCOPY WITH PROPOFOL N/A 09/15/2021   Procedure: COLONOSCOPY WITH PROPOFOL;  Surgeon: Lin Landsman, MD;   Location: [facility];  Service: Gastroenterology;  Laterality: N/A;   HEEL SPUR EXCISION N/A    HYDRADENITIS EXCISION Right 12/31/2015   Procedure: EXCISION HIDRADENITIS AXILLA;  Surgeon: Clayburn Pert, MD;  Location: [facility];  Service: General;  Laterality: Right;   [place]     Patient Active Problem List   Diagnosis Date Noted   OSA (obstructive sleep apnea) 07/12/2021   Rosacea 05/30/2019   COPD (chronic obstructive pulmonary disease) ([place]) 08/25/2016   Acute on chronic heart failure with normal ejection fraction (HCC) 08/20/2016   Chronic diastolic heart failure ([place]) 06/02/2016   Bilateral lower leg cellulitis 05/23/2016   Leukocytosis 05/23/2016   Iron deficiency anemia 05/23/2016   Thrombocytosis 05/23/2016   COPD with acute exacerbation ([place]) 05/21/2016   Pulmonary hypertension ([place]) 12/14/2015   Pneumonia 09/08/2015   Breast abscess 06/02/2015   Abnormal mammogram of both breasts 02/25/2015   Hidradenitis 12/30/2014   Diabetes ([place]) 12/30/2014   Essential hypertension 12/30/2014   Asthma 12/30/2014    PCP:  Denton Lank, MD      REFERRING PROVIDER:  Denton Lank, MD       REFERRING DIAG: R42 (ICD-10-CM) - Dizziness and giddiness   THERAPY DIAG:  No diagnosis found.  ONSET DATE: ***  Rationale for Evaluation  and Treatment: Rehabilitation  SUBJECTIVE:   SUBJECTIVE STATEMENT: Pt presents for vestibular evaluation. Per referral pt with significant history of falls. *** Pt accompanied by: {accompnied:27141}  PERTINENT HISTORY:   Pt is a 66 yo female referred for ongoing dizziness pf 10 years thas been improving. Pt reports the following regarding symptoms: tinnitus, daily symptoms, positionally provoked, occurs with lying flat, turning to L or R side, bending forward and looking down, pt with mild hearing loss per audiogram. Duration is minutes. Her symptoms improve if she stays still. Per referral pt negative  for recurrent otis externa and media. Hx of taking Meclizine. Referral reports pt positive for BPPV of L side with treatment of Epley.   PMH significant for allergies, back pain, CHF, COPD, degenerative joint disease of L knee, depression, DM, HTN, hypothyroidism, IBS, insomnia, neuropathy feet and legs, oxygen dependent, PVD, sleep apnea, sinusitis chronic, vitamin D deficiency, breast biopsy R 02/22/2022  PAIN:  Are you having pain? {OPRCPAIN:27236}  PRECAUTIONS: {Therapy precautions:24002}  WEIGHT BEARING RESTRICTIONS: {Yes ***/No:24003}  FALLS: Has patient fallen in last 6 months? {fallsyesno:27318}  LIVING ENVIRONMENT: Lives with: {OPRC lives with:25569::"lives with their family"} Lives in: {Lives in:25570} Stairs: {opstairs:27293} Has following equipment at home: {Assistive devices:23999}  PLOF: {PLOF:24004}  PATIENT GOALS: ***  OBJECTIVE:   DIAGNOSTIC FINDINGS:  No pertinent recent imaging per chart  COGNITION: Overall cognitive status: {cognition:24006}   SENSATION: {sensation:27233}  EDEMA:  {edema:24020}  MUSCLE TONE:  {LE tone:25568}  DTRs:  {DTR SITE:24025}  POSTURE:  {posture:25561}  Cervical ROM:    {AROM/PROM:27142} A/PROM (deg) eval  Flexion   Extension   Right lateral flexion   Left lateral flexion   Right rotation   Left rotation   (Blank rows = not tested)  STRENGTH: ***  LOWER EXTREMITY MMT:   MMT Right eval Left eval  Hip flexion    Hip abduction    Hip adduction    Hip internal rotation    Hip external rotation    Knee flexion    Knee extension    Ankle dorsiflexion    Ankle plantarflexion    Ankle inversion    Ankle eversion    (Blank rows = not tested)  BED MOBILITY:  {Bed mobility:24027}  TRANSFERS: Assistive device utilized: {Assistive devices:23999}  Sit to stand: {Levels of assistance:24026} Stand to sit: {Levels of assistance:24026} Chair to chair: {Levels of assistance:24026} Floor: {Levels of  assistance:24026}  RAMP: {Levels of assistance:24026}  CURB: {Levels of assistance:24026}  GAIT: Gait pattern: {gait characteristics:25376} Distance walked: *** Assistive device utilized: {Assistive devices:23999} Level of assistance: {Levels of assistance:24026} Comments: ***  FUNCTIONAL TESTS:  {Functional tests:24029}  PATIENT SURVEYS:  {rehab surveys:24030}  VESTIBULAR ASSESSMENT:  GENERAL OBSERVATION: ***   SYMPTOM BEHAVIOR:  Subjective history: ***  Non-Vestibular symptoms: {nonvestibular symptoms:25260}  Type of dizziness: {Type of Dizziness:25255}  Frequency: ***  Duration: ***  Aggravating factors: {Aggravating Factors:25258}  Relieving factors: {Relieving Factors:25259}  Progression of symptoms: {DESC; BETTER/WORSE:18575}  OCULOMOTOR EXAM:  Ocular Alignment: {Ocular Alignment:25262}  Ocular ROM: {RANGE OF MOTION:21649}  Spontaneous Nystagmus: {Spontaneous nystagmus:25263}  Gaze-Induced Nystagmus: {gaze-induced nystagmus:25264}  Smooth Pursuits: {smooth pursuit:25265}  Saccades: {saccades:25266}  Convergence/Divergence: *** cm   FRENZEL - FIXATION SUPRESSED:  Ocular Alignment: {Ocular Alignment:25262}  Spontaneous Nystagmus: {Spontaneous nystagmus:25263}  Gaze-Induced Nystagmus: {gaze-induced nystagmus:25264}  Horizontal head shaking - induced nystagmus: {head shaking induced nystagmus:25267}  Vertical head shaking - induced nystagmus: {head shaking induced nystagmus:25267}  Positional tests: {Positional tests:25271}  Pressure tests: {frenzel pressure tests:25268}  VESTIBULAR -  OCULAR REFLEX:   Slow VOR: {slow VOR:25290}  VOR Cancellation: {vor cancellation:25291}  Head-Impulse Test: {head impulse test:25272}  Dynamic Visual Acuity: {dynamic visual acuity:25273}   POSITIONAL TESTING: {Positional tests:25271}  MOTION SENSITIVITY:  Motion Sensitivity Quotient Intensity: 0 = none, 1 = Lightheaded, 2 = Mild, 3 = Moderate, 4 = Severe, 5 = Vomiting   Intensity  1. Sitting to supine   2. Supine to L side   3. Supine to R side   4. Supine to sitting   5. L Hallpike-Dix   6. Up from L    7. R Hallpike-Dix   8. Up from R    9. Sitting, head tipped to L knee   10. Head up from L knee   11. Sitting, head tipped to R knee   12. Head up from R knee   13. Sitting head turns x5   14.Sitting head nods x5   15. In stance, 180 turn to L    16. In stance, 180 turn to R     OTHOSTATICS: {Exam; orthostatics:31331}  FUNCTIONAL GAIT: {Functional tests:24029}   VESTIBULAR TREATMENT:                                                                                                   DATE: ***  Canalith Repositioning:  {Canalith Repositioning:25283} Gaze Adaptation:  {gaze adaptation:25286} Habituation:  {habituation:25288} Other: ***  PATIENT EDUCATION: Education details: *** Person educated: {Person educated:25204} Education method: {Education Method:25205} Education comprehension: {Education Comprehension:25206}  HOME EXERCISE PROGRAM:  GOALS: Goals reviewed with patient? {yes/no:20286}  SHORT TERM GOALS: Target date: 04/10/2022   Patient will be independent in home exercise program to improve strength/mobility for better functional independence with ADLs. Baseline: Goal status: INITIAL   LONG TERM GOALS: Target date: 05/22/2022    Patient will increase FOTO score to equal to or greater than     to demonstrate statistically significant improvement in mobility and quality of life.  Baseline:  Goal status: INITIAL  2.  Patient will reduce dizziness handicap inventory score to <50, for less dizziness with ADLs and increased safety with home and work tasks.  Baseline:  Goal status: INITIAL  3.  Patient will increase ABC scale score >80% to demonstrate better functional mobility and better confidence with ADLs.  Baseline:  Goal status: INITIAL  4.  Patient will increase dynamic gait index score to >19/24 as to demonstrate  reduced fall risk and improved dynamic gait balance for better safety with community/home ambulation.   Baseline:  Goal status: INITIAL   ASSESSMENT:  CLINICAL IMPRESSION: Patient is a pleasant 66 y.o. female who was seen today for physical therapy evaluation and treatment for dizziness. ***   OBJECTIVE IMPAIRMENTS: {opptimpairments:25111}.   ACTIVITY LIMITATIONS: {activitylimitations:27494}  PARTICIPATION LIMITATIONS: {participationrestrictions:25113}  PERSONAL FACTORS: {Personal factors:25162} are also affecting patient's functional outcome.   REHAB POTENTIAL: {rehabpotential:25112}  CLINICAL DECISION MAKING: {clinical decision making:25114}  EVALUATION COMPLEXITY: {Evaluation complexity:25115}   PLAN:  PT FREQUENCY: {rehab frequency:25116}  PT DURATION: {rehab duration:25117}  PLANNED INTERVENTIONS: Therapeutic exercises, Therapeutic activity, Neuromuscular re-education, Balance training, Gait training, Patient/Family education,  Self Care, Joint mobilization, Stair training, Vestibular training, Canalith repositioning, Visual/preceptual remediation/compensation, Orthotic/Fit training, DME instructions, Electrical stimulation, Wheelchair mobility training, Spinal mobilization, Cryotherapy, Moist heat, Splintting, Taping, Ultrasound, Manual therapy, and Re-evaluation  PLAN FOR NEXT SESSION: ***   Zollie Pee, PT 02/27/2022, 11:13 AM

## 2022-03-01 ENCOUNTER — Ambulatory Visit: Payer: Medicare Other

## 2022-03-01 ENCOUNTER — Other Ambulatory Visit: Payer: Self-pay | Admitting: Family Medicine

## 2022-03-01 ENCOUNTER — Ambulatory Visit: Payer: Medicare Other | Attending: Student

## 2022-03-01 DIAGNOSIS — R2681 Unsteadiness on feet: Secondary | ICD-10-CM

## 2022-03-01 DIAGNOSIS — R928 Other abnormal and inconclusive findings on diagnostic imaging of breast: Secondary | ICD-10-CM

## 2022-03-01 DIAGNOSIS — R262 Difficulty in walking, not elsewhere classified: Secondary | ICD-10-CM | POA: Diagnosis present

## 2022-03-01 DIAGNOSIS — R42 Dizziness and giddiness: Secondary | ICD-10-CM

## 2022-03-01 DIAGNOSIS — R921 Mammographic calcification found on diagnostic imaging of breast: Secondary | ICD-10-CM

## 2022-03-01 NOTE — Therapy (Signed)
OUTPATIENT PHYSICAL THERAPY VESTIBULAR EVALUATION     Patient Name: [name] [name] MRN: [id number] DOB:06/19/1955, 66 y.o., female Today's Date: 03/01/2022  END OF SESSION:  PT End of Session - 03/01/22 1730     Visit Number 1    Number of Visits 25    Date for PT Re-Evaluation 05/24/22    PT Start Time 2671    PT Stop Time 1657    PT Time Calculation (min) 67 min    Equipment Utilized During Treatment Gait belt    Activity Tolerance Patient tolerated treatment well    Behavior During Therapy WFL for tasks assessed/performed             Past Medical History:  Diagnosis Date   Allergic rhinitis    Allergy    Asthma    Back pain    CHF (congestive heart failure) (HCC)    COPD (chronic obstructive pulmonary disease) (HCC)    Degenerative joint disease of knee, left    Depression    Diabetes mellitus without complication (HCC)    Edema    Elevated lipids    GERD (gastroesophageal reflux disease)    Hidradenitis    History of colonic polyps    Hypertension    Hypothyroidism    IBS (irritable bowel syndrome)    Insomnia    Neuropathy    feet and legs   Obesity    Oxygen dependent    Pneumonia    PONV (postoperative nausea and vomiting)    PVD (peripheral vascular disease) (HCC)    Sinusitis, chronic    Sleep apnea    Thyroid disease    Vaginitis, atrophic    Vertigo    Vitamin D deficiency    Past Surgical History:  Procedure Laterality Date   ABDOMINAL HYSTERECTOMY     AXILLARY HIDRADENITIS EXCISION     BREAST BIOPSY Right 02/22/2022   stereo bx, calcs, "X" clip- path pending   BREAST BIOPSY Right 02/22/2022   MM RT BREAST BX W LOC DEV 1ST LESION IMAGE BX SPEC STEREO GUIDE 02/22/2022 [facility]   BREAST EXCISIONAL BIOPSY Bilateral 24/5809   RUPTURED FOLLICULAR CYSTS WITH ABSCESSES AND SCARRING, CONSISTENT WITH HIDRADENITIS SUPPURATIVA.    CATARACT EXTRACTION W/PHACO Right 07/27/2020   Procedure: CATARACT EXTRACTION PHACO AND INTRAOCULAR LENS  PLACEMENT (IOC) RIGHT DIABETIC 17.13 01:23.9;  Surgeon: Birder Robson, MD;  Location: [place];  Service: Ophthalmology;  Laterality: Right;  Diabetic - insulin  sleep apnea wants to be last   CESAREAN SECTION     x 2   CHOLECYSTECTOMY     COLONOSCOPY WITH PROPOFOL N/A 09/15/2021   Procedure: COLONOSCOPY WITH PROPOFOL;  Surgeon: Lin Landsman, MD;  Location: [facility];  Service: Gastroenterology;  Laterality: N/A;   HEEL SPUR EXCISION N/A    HYDRADENITIS EXCISION Right 12/31/2015   Procedure: EXCISION HIDRADENITIS AXILLA;  Surgeon: Clayburn Pert, MD;  Location: [facility];  Service: General;  Laterality: Right;   KNEE SURGERY Left    1998   TONSILLECTOMY     Patient Active Problem List   Diagnosis Date Noted   OSA (obstructive sleep apnea) 07/12/2021   Rosacea 05/30/2019   COPD (chronic obstructive pulmonary disease) ([place]) 08/25/2016   Acute on chronic heart failure with normal ejection fraction (HCC) 08/20/2016   Chronic diastolic heart failure ([place]) 06/02/2016   Bilateral lower leg cellulitis 05/23/2016   Leukocytosis 05/23/2016   Iron deficiency anemia 05/23/2016   Thrombocytosis 05/23/2016   COPD with acute exacerbation ([place])  05/21/2016   Pulmonary hypertension ([place]) 12/14/2015   Pneumonia 09/08/2015   Breast abscess 06/02/2015   Abnormal mammogram of both breasts 02/25/2015   Hidradenitis 12/30/2014   Diabetes ([place]) 12/30/2014   Essential hypertension 12/30/2014   Asthma 12/30/2014    PCP:  Denton Lank, MD     REFERRING PROVIDER:  Denton Lank, MD      REFERRING DIAG: R42 (ICD-10-CM) - Dizziness and giddiness   THERAPY DIAG:  Dizziness and giddiness  Unsteadiness on feet  Difficulty in walking, not elsewhere classified  ONSET DATE: ongoing for 10 years  Rationale for Evaluation and Treatment: Rehabilitation  SUBJECTIVE:   SUBJECTIVE STATEMENT: Pt presents for vestibular evaluation due to ongoing imbalance with frequent falls  and lingering dizziness. Per referral pt with significant history of falls and pt fell 3x over Thanksgiving where she hit her head and face, reports was able to get up by herself and ambulate later that day. She reports she's had a good week since having Epley performed by ENT, but still has occasional feelings of movement/unsteadiness. Pt has had some residual spinning the past week, but reports overall improvement.  Positions/activities that bring on symptoms: bending, turning head quickly, laying down (tilts bed up), looking up.  Pt has to look out the window in a car or she feels dizzy, reports scrolling on a phone does not make her dizzy. Pt denies recent lightheadedness, pt reports no ear pain.  Pt accompanied by: self  PERTINENT HISTORY:   Pt is a 66 yo female referred for ongoing dizziness of the past 10 years thas been improving since being treated with L Epley by ENT. Pt confirms the following regarding symptoms: tinnitus, thinks she could still trigger her symptoms with looking down, positionally provoked, occurs with lying flat, turning to L or R side, bending forward and looking down, pt with mild hearing loss per audiogram and per pt it is her R ear that has decreased hearing. Duration of symptoms when they occur is minutes, never lasts longer than this. Her symptoms improve if she stays still. Per referral pt negative for recurrent otis externa and media. Hx of taking Meclizine (taking prn). Referral reports pt positive for BPPV of L side with treatment of Epley.   PMH significant for allergies, back pain, CHF, COPD, gout, degenerative joint disease of L knee, depression, DM, HTN, hypothyroidism, IBS, insomnia, neuropathy feet and legs, oxygen dependent, PVD, sleep apnea, sinusitis chronic, vitamin D deficiency, iron deficiency anemia, breast biopsy R 02/22/2022   PAIN:  Are you having pain? No and Yes: NPRS scale: posterior neck soreness/not rated/10 Pain location: Some soreness felt  posterior neck, L side neck Pain description: chronic Aggravating factors:   Relieving factors:    PRECAUTIONS: Fall  WEIGHT BEARING RESTRICTIONS:  none  FALLS: Has patient fallen in last 6 months? Yes. Number of falls 7-8  LIVING ENVIRONMENT: Lives with:  Lives in:  Stairs:  Has following equipment at home:   PLOF: Independent  PATIENT GOALS: I don't want to be dizzy, I don't want to fall. I want to improve my walking   OBJECTIVE:   DIAGNOSTIC FINDINGS:  No pertinent recent imaging per chart  COGNITION: Overall cognitive status: Within functional limits for tasks assessed   SENSATION: deferred  POSTURE:  rounded shoulders and forward head   LOWER EXTREMITY MMT: deferred   BED MOBILITY:  Limited due to dizziness, reports sleeps slightly reclined  TRANSFERS: Assistive device utilized: Walker - 4 wheeled  Sit to stand: Modified  independence Stand to sit: Modified independence   GAIT: Gait pattern: decreased stride length Distance walked: clinic distances Assistive device utilized: Walker - 4 wheeled Level of assistance: SBA and CGA   FUNCTIONAL TESTS:  DGI and M-CTSIB to be performed future session   PATIENT SURVEYS:  ABC scale 24.4% DHI 52 FOTO 53  VESTIBULAR ASSESSMENT: SYMPTOM BEHAVIOR:  Subjective history: Recently seen by ENT and treated for L side BPPV, pt now reporting improvement with vertigo, but still unsteady and experiencing falls  Non-Vestibular symptoms: changes in hearing, neck pain, and tinnitus  Type of dizziness: feels inside of her head move  Frequency: with position  Duration: minutes  Aggravating factors:   Relieving factors:  remaining still  Progression of symptoms: better  OCULOMOTOR EXAM:  Ocular Alignment: normal  Ocular ROM: No Limitations  Spontaneous Nystagmus: absent  Gaze-Induced Nystagmus: absent  Smooth Pursuits: saccades  Saccades: extra eye movements  Convergence/Divergence: WNL    VESTIBULAR -  OCULAR REFLEX:   Slow VOR: Normal   Head-Impulse Test: HIT Right: positive, Left:negative - reported mild hearing loss on R side as well    POSITIONAL TESTING: Right Dix-Hallpike: no nystagmus Left Dix-Hallpike: no nystagmus Right Roll Test: no nystagmus Left Roll Test: no nystagmus    OTHOSTATICS: not done, deferred  FUNCTIONAL GAIT: Dynamic Gait Index: deferred   VESTIBULAR TREATMENT:                                                                                                   DATE:   Instructed in VORx1 (seated) HEP Seated VORx1, plain background, vertical and horizontal head turns 30 sec for each   PATIENT EDUCATION: Education details: exam findings, goals, plan Person educated: Patient Education method: Explanation, Demonstration, Verbal cues, and Handouts Education comprehension: verbalized understanding, returned demonstration, verbal cues required, and needs further education  HOME EXERCISE PROGRAM: Access Code: [email or web address] URL: [email or web address] Date: 03/01/2022 Prepared by: Ricard Dillon  Exercises - Seated Gaze Stabilization with Head Rotation  - 1 x daily - 7 x weekly - 3 sets - 2 reps - 30 sec  hold - Seated Gaze Stabilization with Head Nod  - 1 x daily - 7 x weekly - 3 sets - 2 reps - 30 sec hold  GOALS: Goals reviewed with patient? Yes  SHORT TERM GOALS: Target date: 04/12/2022   Patient will be independent in home exercise program to improve strength/mobility for better functional independence with ADLs. Baseline: initiated Goal status: INITIAL   LONG TERM GOALS: Target date: 05/24/2022    Patient will increase FOTO score to equal to or greater than   53  to demonstrate statistically significant improvement in mobility and quality of life.  Baseline: 46 Goal status: INITIAL  2.  Patient will reduce dizziness handicap inventory score to <50, for less dizziness with ADLs and increased safety with home and work tasks.  Baseline:   52 Goal status: INITIAL  3.  Patient will increase ABC scale score >80% to demonstrate better functional mobility and better confidence with ADLs.  Baseline: 24.4% Goal status: INITIAL  4.  Patient will  increase dynamic gait index score to >19/24 as to demonstrate reduced fall risk and improved dynamic gait balance for better safety with community/home ambulation.   Baseline: deferred due to time Goal status: INITIAL   ASSESSMENT:  CLINICAL IMPRESSION: Patient is a pleasant 66 y.o. female who was seen today for physical therapy evaluation and treatment for dizziness and imbalance. Pt recently treated by ENT (L Epley) and found to be negative with all positional testing today. Oculomotor exam revealed abnormal saccades and smooth pursuits, pt also with positive R HIT and does report mild hearing impairment found on R side as well. Pt with hx of frequent falls, and exhibits poor balance confidence AEB ABC scale, with perception of moderate handicap due to dizziness per DHI score. Formal balance testing to be completed next session. PT issued seated VORx1 HEP. The pt will benefit from further skilled vestibular rehab to improve balance, positionally provoked dizziness and to decrease fall risk and increase QOL.   OBJECTIVE IMPAIRMENTS: Abnormal gait, decreased balance, decreased mobility, difficulty walking, dizziness, improper body mechanics, postural dysfunction, obesity, and pain.   ACTIVITY LIMITATIONS: bending, standing, squatting, stairs, transfers, bed mobility, reach over head, and locomotion level  PARTICIPATION LIMITATIONS: cleaning, laundry, shopping, community activity, and yard work  PERSONAL FACTORS: Age, Sex, Social background, and 3+ comorbidities: PMH significant for allergies, back pain, CHF, COPD, gout, degenerative joint disease of L knee, depression, DM, HTN, hypothyroidism, IBS, insomnia, neuropathy feet and legs, oxygen dependent, PVD, sleep apnea, sinusitis chronic,  vitamin D deficiency, iron deficiency anemia, breast biopsy R 02/22/2022   are also affecting patient's functional outcome.   REHAB POTENTIAL: Good  CLINICAL DECISION MAKING: Evolving/moderate complexity  EVALUATION COMPLEXITY: Moderate   PLAN:  PT FREQUENCY: 2x/week  PT DURATION: 12 weeks  PLANNED INTERVENTIONS: Therapeutic exercises, Therapeutic activity, Neuromuscular re-education, Balance training, Gait training, Patient/Family education, Self Care, Joint mobilization, Stair training, Vestibular training, Canalith repositioning, Visual/preceptual remediation/compensation, Orthotic/Fit training, DME instructions, Electrical stimulation, Wheelchair mobility training, Spinal mobilization, Cryotherapy, Moist heat, Splintting, Taping, Ultrasound, Manual therapy, and Re-evaluation  PLAN FOR NEXT SESSION: DVA, DGI, MMT, VOR, balance   Zollie Pee, PT 03/01/2022, 5:31 PM

## 2022-03-06 ENCOUNTER — Ambulatory Visit: Payer: Medicare Other

## 2022-03-06 ENCOUNTER — Telehealth: Payer: Self-pay

## 2022-03-06 NOTE — Telephone Encounter (Signed)
PT contacted pt via secure phone line due to missed visit/no show today. Pt reports she did not realize she had an appointment. PT provided pt with information about next appointment date/time. Pt confirms understanding.  Ricard Dillon PT, DPT

## 2022-03-07 ENCOUNTER — Ambulatory Visit: Payer: Medicare Other

## 2022-03-08 ENCOUNTER — Ambulatory Visit: Payer: Medicare Other

## 2022-03-09 ENCOUNTER — Ambulatory Visit: Payer: Medicare Other

## 2022-03-14 NOTE — Therapy (Incomplete)
OUTPATIENT PHYSICAL THERAPY VESTIBULAR TREATMENT NOTE     Patient Name: [name] [name] MRN: [id number] DOB:06/02/1955, 66 y.o., female Today's Date: 03/01/2022  END OF SESSION:  PT End of Session - 03/01/22 1730     Visit Number 1    Number of Visits 25    Date for PT Re-Evaluation 05/24/22    PT Start Time 2297    PT Stop Time 1657    PT Time Calculation (min) 67 min    Equipment Utilized During Treatment Gait belt    Activity Tolerance Patient tolerated treatment well    Behavior During Therapy WFL for tasks assessed/performed             Past Medical History:  Diagnosis Date   Allergic rhinitis    Allergy    Asthma    Back pain    CHF (congestive heart failure) (HCC)    COPD (chronic obstructive pulmonary disease) (HCC)    Degenerative joint disease of knee, left    Depression    Diabetes mellitus without complication (HCC)    Edema    Elevated lipids    GERD (gastroesophageal reflux disease)    Hidradenitis    History of colonic polyps    Hypertension    Hypothyroidism    IBS (irritable bowel syndrome)    Insomnia    Neuropathy    feet and legs   Obesity    Oxygen dependent    Pneumonia    PONV (postoperative nausea and vomiting)    PVD (peripheral vascular disease) (HCC)    Sinusitis, chronic    Sleep apnea    Thyroid disease    Vaginitis, atrophic    Vertigo    Vitamin D deficiency    Past Surgical History:  Procedure Laterality Date   ABDOMINAL HYSTERECTOMY     AXILLARY HIDRADENITIS EXCISION     BREAST BIOPSY Right 02/22/2022   stereo bx, calcs, "X" clip- path pending   BREAST BIOPSY Right 02/22/2022   MM RT BREAST BX W LOC DEV 1ST LESION IMAGE BX SPEC STEREO GUIDE 02/22/2022 [facility]   BREAST EXCISIONAL BIOPSY Bilateral 98/9211   RUPTURED FOLLICULAR CYSTS WITH ABSCESSES AND SCARRING, CONSISTENT WITH HIDRADENITIS SUPPURATIVA.    CATARACT EXTRACTION W/PHACO Right 07/27/2020   Procedure: CATARACT EXTRACTION PHACO AND INTRAOCULAR LENS  PLACEMENT (IOC) RIGHT DIABETIC 17.13 01:23.9;  Surgeon: Birder Robson, MD;  Location: [place];  Service: Ophthalmology;  Laterality: Right;  Diabetic - insulin  sleep apnea wants to be last   CESAREAN SECTION     x 2   CHOLECYSTECTOMY     COLONOSCOPY WITH PROPOFOL N/A 09/15/2021   Procedure: COLONOSCOPY WITH PROPOFOL;  Surgeon: Lin Landsman, MD;  Location: [facility];  Service: Gastroenterology;  Laterality: N/A;   HEEL SPUR EXCISION N/A    HYDRADENITIS EXCISION Right 12/31/2015   Procedure: EXCISION HIDRADENITIS AXILLA;  Surgeon: Clayburn Pert, MD;  Location: [facility];  Service: General;  Laterality: Right;   KNEE SURGERY Left    1998   TONSILLECTOMY     Patient Active Problem List   Diagnosis Date Noted   OSA (obstructive sleep apnea) 07/12/2021   Rosacea 05/30/2019   COPD (chronic obstructive pulmonary disease) ([place]) 08/25/2016   Acute on chronic heart failure with normal ejection fraction (HCC) 08/20/2016   Chronic diastolic heart failure ([place]) 06/02/2016   Bilateral lower leg cellulitis 05/23/2016   Leukocytosis 05/23/2016   Iron deficiency anemia 05/23/2016   Thrombocytosis 05/23/2016   COPD with acute exacerbation (  [place]) 05/21/2016   Pulmonary hypertension ([place]) 12/14/2015   Pneumonia 09/08/2015   Breast abscess 06/02/2015   Abnormal mammogram of both breasts 02/25/2015   Hidradenitis 12/30/2014   Diabetes ([place]) 12/30/2014   Essential hypertension 12/30/2014   Asthma 12/30/2014    PCP:  Denton Lank, MD     REFERRING PROVIDER:  Denton Lank, MD      REFERRING DIAG: R42 (ICD-10-CM) - Dizziness and giddiness   THERAPY DIAG:  Dizziness and giddiness  Unsteadiness on feet  Difficulty in walking, not elsewhere classified  ONSET DATE: ongoing for 10 years  Rationale for Evaluation and Treatment: Rehabilitation  SUBJECTIVE:   SUBJECTIVE STATEMENT: Pt presents for vestibular evaluation due to ongoing imbalance with frequent falls  and lingering dizziness. Per referral pt with significant history of falls and pt fell 3x over Thanksgiving where she hit her head and face, reports was able to get up by herself and ambulate later that day. She reports she's had a good week since having Epley performed by ENT, but still has occasional feelings of movement/unsteadiness. Pt has had some residual spinning the past week, but reports overall improvement.  Positions/activities that bring on symptoms: bending, turning head quickly, laying down (tilts bed up), looking up.  Pt has to look out the window in a car or she feels dizzy, reports scrolling on a phone does not make her dizzy. Pt denies recent lightheadedness, pt reports no ear pain.  Pt accompanied by: self  PERTINENT HISTORY:   Pt is a 66 yo female referred for ongoing dizziness of the past 10 years that has been improving since being treated with L Epley by ENT. Pt confirms the following regarding symptoms: tinnitus, thinks she could still trigger her symptoms with looking down, positionally provoked, occurs with lying flat, turning to L or R side, bending forward and looking down, pt with mild hearing loss per audiogram and per pt it is her R ear that has decreased hearing. Duration of symptoms when they occur is minutes, never lasts longer than this. Her symptoms improve if she stays still. Per referral pt negative for recurrent otis externa and media. Hx of taking Meclizine (taking prn). Referral reports pt positive for BPPV of L side with treatment of Epley.   PMH significant for allergies, back pain, CHF, COPD, gout, degenerative joint disease of L knee, depression, DM, HTN, hypothyroidism, IBS, insomnia, neuropathy feet and legs, oxygen dependent, PVD, sleep apnea, sinusitis chronic, vitamin D deficiency, iron deficiency anemia, breast biopsy R 02/22/2022   PAIN:  Are you having pain? No and Yes: NPRS scale: posterior neck soreness/not rated/10 Pain location: Some soreness  felt posterior neck, L side neck Pain description: chronic Aggravating factors:   Relieving factors:    PRECAUTIONS: Fall  WEIGHT BEARING RESTRICTIONS:  none  FALLS: Has patient fallen in last 6 months? Yes. Number of falls 7-8  LIVING ENVIRONMENT: Lives with:  Lives in:  Stairs:  Has following equipment at home:   PLOF: Independent  PATIENT GOALS: I don't want to be dizzy, I don't want to fall. I want to improve my walking   OBJECTIVE:   DIAGNOSTIC FINDINGS:  No pertinent recent imaging per chart  COGNITION: Overall cognitive status: Within functional limits for tasks assessed   SENSATION: deferred  POSTURE:  rounded shoulders and forward head   LOWER EXTREMITY MMT: deferred   BED MOBILITY:  Limited due to dizziness, reports sleeps slightly reclined  TRANSFERS: Assistive device utilized: Walker - 4 wheeled  Sit to  stand: Modified independence Stand to sit: Modified independence   GAIT: Gait pattern: decreased stride length Distance walked: clinic distances Assistive device utilized: Walker - 4 wheeled Level of assistance: SBA and CGA   FUNCTIONAL TESTS:  DGI and M-CTSIB to be performed future session   PATIENT SURVEYS:  ABC scale 24.4% DHI 52 FOTO 53  VESTIBULAR ASSESSMENT: SYMPTOM BEHAVIOR:  Subjective history: Recently seen by ENT and treated for L side BPPV, pt now reporting improvement with vertigo, but still unsteady and experiencing falls  Non-Vestibular symptoms: changes in hearing, neck pain, and tinnitus  Type of dizziness: feels inside of her head move  Frequency: with position  Duration: minutes  Aggravating factors:   Relieving factors:  remaining still  Progression of symptoms: better  OCULOMOTOR EXAM:  Ocular Alignment: normal  Ocular ROM: No Limitations  Spontaneous Nystagmus: absent  Gaze-Induced Nystagmus: absent  Smooth Pursuits: saccades  Saccades: extra eye movements  Convergence/Divergence: WNL    VESTIBULAR -  OCULAR REFLEX:   Slow VOR: Normal   Head-Impulse Test: HIT Right: positive, Left:negative - reported mild hearing loss on R side as well    POSITIONAL TESTING: Right Dix-Hallpike: no nystagmus Left Dix-Hallpike: no nystagmus Right Roll Test: no nystagmus Left Roll Test: no nystagmus    OTHOSTATICS: not done, deferred  FUNCTIONAL GAIT: Dynamic Gait Index: deferred   VESTIBULAR TREATMENT:                                                                                                   DATE:  ***DGI and M-CTSIB to be performed future session ***Orthostatics Supine: Seated: Standing   DVA MMT LE MMT UE  previous Instructed in VORx1 (seated) HEP Seated VORx1, plain background, vertical and horizontal head turns 30 sec for each   PATIENT EDUCATION: Education details: exam findings, goals, plan Person educated: Patient Education method: Explanation, Demonstration, Verbal cues, and Handouts Education comprehension: verbalized understanding, returned demonstration, verbal cues required, and needs further education  HOME EXERCISE PROGRAM: Access Code: [email or web address] URL: [email or web address] Date: 03/01/2022 Prepared by: Ricard Dillon  Exercises - Seated Gaze Stabilization with Head Rotation  - 1 x daily - 7 x weekly - 3 sets - 2 reps - 30 sec  hold - Seated Gaze Stabilization with Head Nod  - 1 x daily - 7 x weekly - 3 sets - 2 reps - 30 sec hold  GOALS: Goals reviewed with patient? Yes  SHORT TERM GOALS: Target date: 04/12/2022   Patient will be independent in home exercise program to improve strength/mobility for better functional independence with ADLs. Baseline: initiated Goal status: INITIAL   LONG TERM GOALS: Target date: 05/24/2022    Patient will increase FOTO score to equal to or greater than   53  to demonstrate statistically significant improvement in mobility and quality of life.  Baseline: 46 Goal status: INITIAL  2.  Patient will reduce  dizziness handicap inventory score to <50, for less dizziness with ADLs and increased safety with home and work tasks.  Baseline:  52 Goal status: INITIAL  3.  Patient will increase ABC scale score >  80% to demonstrate better functional mobility and better confidence with ADLs.  Baseline: 24.4% Goal status: INITIAL  4.  Patient will increase dynamic gait index score to >19/24 as to demonstrate reduced fall risk and improved dynamic gait balance for better safety with community/home ambulation.   Baseline: deferred due to time Goal status: INITIAL   ASSESSMENT:  CLINICAL IMPRESSION: Patient is a pleasant 66 y.o. female who was seen today for physical therapy evaluation and treatment for dizziness and imbalance. Pt recently treated by ENT (L Epley) and found to be negative with all positional testing today. Oculomotor exam revealed abnormal saccades and smooth pursuits, pt also with positive R HIT and does report mild hearing impairment found on R side as well. Pt with hx of frequent falls, and exhibits poor balance confidence AEB ABC scale, with perception of moderate handicap due to dizziness per DHI score. Formal balance testing to be completed next session. PT issued seated VORx1 HEP. The pt will benefit from further skilled vestibular rehab to improve balance, positionally provoked dizziness and to decrease fall risk and increase QOL.   OBJECTIVE IMPAIRMENTS: Abnormal gait, decreased balance, decreased mobility, difficulty walking, dizziness, improper body mechanics, postural dysfunction, obesity, and pain.   ACTIVITY LIMITATIONS: bending, standing, squatting, stairs, transfers, bed mobility, reach over head, and locomotion level  PARTICIPATION LIMITATIONS: cleaning, laundry, shopping, community activity, and yard work  PERSONAL FACTORS: Age, Sex, Social background, and 3+ comorbidities: PMH significant for allergies, back pain, CHF, COPD, gout, degenerative joint disease of L knee,  depression, DM, HTN, hypothyroidism, IBS, insomnia, neuropathy feet and legs, oxygen dependent, PVD, sleep apnea, sinusitis chronic, vitamin D deficiency, iron deficiency anemia, breast biopsy R 02/22/2022   are also affecting patient's functional outcome.   REHAB POTENTIAL: Good  CLINICAL DECISION MAKING: Evolving/moderate complexity  EVALUATION COMPLEXITY: Moderate   PLAN:  PT FREQUENCY: 2x/week  PT DURATION: 12 weeks  PLANNED INTERVENTIONS: Therapeutic exercises, Therapeutic activity, Neuromuscular re-education, Balance training, Gait training, Patient/Family education, Self Care, Joint mobilization, Stair training, Vestibular training, Canalith repositioning, Visual/preceptual remediation/compensation, Orthotic/Fit training, DME instructions, Electrical stimulation, Wheelchair mobility training, Spinal mobilization, Cryotherapy, Moist heat, Splintting, Taping, Ultrasound, Manual therapy, and Re-evaluation  PLAN FOR NEXT SESSION: DVA, DGI, MMT, VOR, balance   Zollie Pee, PT 03/01/2022, 5:31 PM

## 2022-03-15 ENCOUNTER — Ambulatory Visit: Payer: Medicare Other

## 2022-03-22 ENCOUNTER — Ambulatory Visit: Payer: 59

## 2022-03-27 ENCOUNTER — Ambulatory Visit: Payer: 59 | Attending: Student

## 2022-03-27 ENCOUNTER — Ambulatory Visit: Payer: Medicare Other

## 2022-03-27 DIAGNOSIS — M6281 Muscle weakness (generalized): Secondary | ICD-10-CM | POA: Diagnosis present

## 2022-03-27 DIAGNOSIS — R42 Dizziness and giddiness: Secondary | ICD-10-CM | POA: Insufficient documentation

## 2022-03-27 DIAGNOSIS — R2681 Unsteadiness on feet: Secondary | ICD-10-CM | POA: Diagnosis present

## 2022-03-27 DIAGNOSIS — R262 Difficulty in walking, not elsewhere classified: Secondary | ICD-10-CM | POA: Insufficient documentation

## 2022-03-27 NOTE — Therapy (Signed)
OUTPATIENT PHYSICAL THERAPY VESTIBULAR TREATMENT NOTE     Patient Name: KITTIE KRIZAN MRN: [id number] DOB:01-Oct-1955, 67 y.o., female Today's Date: 03/27/2022  END OF SESSION:  PT End of Session - 03/27/22 1713     Visit Number 2    Number of Visits 25    Date for PT Re-Evaluation 05/24/22    PT Start Time 1440    PT Stop Time 1516    PT Time Calculation (min) 36 min    Equipment Utilized During Treatment Gait belt    Activity Tolerance Patient tolerated treatment well;No increased pain    Behavior During Therapy WFL for tasks assessed/performed             Past Medical History:  Diagnosis Date   Allergic rhinitis    Allergy    Asthma    Back pain    CHF (congestive heart failure) (HCC)    COPD (chronic obstructive pulmonary disease) (HCC)    Degenerative joint disease of knee, left    Depression    Diabetes mellitus without complication (HCC)    Edema    Elevated lipids    GERD (gastroesophageal reflux disease)    Hidradenitis    History of colonic polyps    Hypertension    Hypothyroidism    IBS (irritable bowel syndrome)    Insomnia    Neuropathy    feet and legs   Obesity    Oxygen dependent    Pneumonia    PONV (postoperative nausea and vomiting)    PVD (peripheral vascular disease) (HCC)    Sinusitis, chronic    Sleep apnea    Thyroid disease    Vaginitis, atrophic    Vertigo    Vitamin D deficiency    Past Surgical History:  Procedure Laterality Date   ABDOMINAL HYSTERECTOMY     AXILLARY HIDRADENITIS EXCISION     BREAST BIOPSY Right 02/22/2022   stereo bx, calcs, "X" clip- path pending   BREAST BIOPSY Right 02/22/2022   MM RT BREAST BX W LOC DEV 1ST LESION IMAGE BX SPEC STEREO GUIDE 02/22/2022 [facility]   BREAST EXCISIONAL BIOPSY Bilateral 82/9562   RUPTURED FOLLICULAR CYSTS WITH ABSCESSES AND SCARRING, CONSISTENT WITH HIDRADENITIS SUPPURATIVA.    CATARACT EXTRACTION W/PHACO Right 07/27/2020   Procedure: CATARACT EXTRACTION PHACO AND  INTRAOCULAR LENS PLACEMENT (IOC) RIGHT DIABETIC 17.13 01:23.9;  Surgeon: Birder Robson, MD;  Location: [place];  Service: Ophthalmology;  Laterality: Right;  Diabetic - insulin  sleep apnea wants to be last   CESAREAN SECTION     x 2   CHOLECYSTECTOMY     COLONOSCOPY WITH PROPOFOL N/A 09/15/2021   Procedure: COLONOSCOPY WITH PROPOFOL;  Surgeon: Lin Landsman, MD;  Location[facility] ENDOSCOPY;  Service: Gastroenterology;  Laterality: N/A;   HEEL SPUR EXCISION N/A    HYDRADENITIS EXCISION Right 12/31/2015   Procedure: EXCISION HIDRADENITIS AXILLA;  Surgeon: Clayburn Pert, MD;  Location: [facility];  Service: General;  Laterality: Right;   KNEE SURGERY Left    1998   TONSILLECTOMY     Patient Active Problem List   Diagnosis Date Noted   OSA (obstructive sleep apnea) 07/12/2021   Rosacea 05/30/2019   COPD (chronic obstructive pulmonary disease) ([place]) 08/25/2016   Acute on chronic heart failure with normal ejection fraction (HCC) 08/20/2016   Chronic diastolic heart failure ([place]) 06/02/2016   Bilateral lower leg cellulitis 05/23/2016   Leukocytosis 05/23/2016   Iron deficiency anemia 05/23/2016   Thrombocytosis 05/23/2016   COPD with  acute exacerbation ([place]) 05/21/2016   Pulmonary hypertension ([place]) 12/14/2015   Pneumonia 09/08/2015   Breast abscess 06/02/2015   Abnormal mammogram of both breasts 02/25/2015   Hidradenitis 12/30/2014   Diabetes ([place]) 12/30/2014   Essential hypertension 12/30/2014   Asthma 12/30/2014    PCP:  Denton Lank, MD     REFERRING PROVIDER:  Denton Lank, MD      REFERRING DIAG: R42 (ICD-10-CM) - Dizziness and giddiness   THERAPY DIAG:  Dizziness and giddiness  Unsteadiness on feet  Difficulty in walking, not elsewhere classified  Muscle weakness (generalized)  ONSET DATE: ongoing for 10 years  Rationale for Evaluation and Treatment: Rehabilitation  SUBJECTIVE:   SUBJECTIVE STATEMENT: Pt arrives early (wrong appt  time), however PT with opening and able to see pt early. The pt reports she just had cortisone shot in L shoulder and is getting one in RUE next week. Pt reports no stumbles/falls since last appointment. She reports she has been inconsistently doing HEP. Pt reports dizziness not as bad as before and states she has "not really" had any spinning. She thinks HEP is helping to improve her symptoms, feels balance is better.   Pt accompanied by: self  PERTINENT HISTORY:   Pt is a 67 yo female referred for ongoing dizziness of the past 10 years that has been improving since being treated with L Epley by ENT. Pt confirms the following regarding symptoms: tinnitus, thinks she could still trigger her symptoms with looking down, positionally provoked, occurs with lying flat, turning to L or R side, bending forward and looking down, pt with mild hearing loss per audiogram and per pt it is her R ear that has decreased hearing. Duration of symptoms when they occur is minutes, never lasts longer than this. Her symptoms improve if she stays still. Per referral pt negative for recurrent otis externa and media. Hx of taking Meclizine (taking prn). Referral reports pt positive for BPPV of L side with treatment of Epley.   Positions/activities that bring on symptoms: bending, turning head quickly, laying down (tilts bed up), looking up.  Pt has to look out the window in a car or she feels dizzy, reports scrolling on a phone does not make her dizzy. Pt denies recent lightheadedness, pt reports no ear pain.  PMH significant for allergies, back pain, CHF, COPD, gout, degenerative joint disease of L knee, depression, DM, HTN, hypothyroidism, IBS, insomnia, neuropathy feet and legs, oxygen dependent, PVD, sleep apnea, sinusitis chronic, vitamin D deficiency, iron deficiency anemia, breast biopsy R 02/22/2022   PAIN:  Are you having pain? No and Yes: NPRS scale: posterior neck soreness/not rated/10 Pain location: Some soreness  felt posterior neck, L side neck Pain description: chronic Aggravating factors:   Relieving factors:    PRECAUTIONS: Fall  WEIGHT BEARING RESTRICTIONS:  none  FALLS: Has patient fallen in last 6 months? Yes. Number of falls 7-8  LIVING ENVIRONMENT: Lives with:  Lives in:  Stairs:  Has following equipment at home:   PLOF: Independent  PATIENT GOALS: I don't want to be dizzy, I don't want to fall. I want to improve my walking   OBJECTIVE:   DIAGNOSTIC FINDINGS:  No pertinent recent imaging per chart  COGNITION: Overall cognitive status: Within functional limits for tasks assessed   SENSATION: deferred  POSTURE:  rounded shoulders and forward head   LOWER EXTREMITY MMT: deferred   BED MOBILITY:  Limited due to dizziness, reports sleeps slightly reclined  TRANSFERS: Assistive device utilized: Environmental consultant -  4 wheeled  Sit to stand: Modified independence Stand to sit: Modified independence   GAIT: Gait pattern: decreased stride length Distance walked: clinic distances Assistive device utilized: Environmental consultant - 4 wheeled Level of assistance: SBA and CGA   FUNCTIONAL TESTS:  DGI and M-CTSIB to be performed future session   PATIENT SURVEYS:  ABC scale 24.4% DHI 52 FOTO 53  VESTIBULAR ASSESSMENT: SYMPTOM BEHAVIOR:  Subjective history: Recently seen by ENT and treated for L side BPPV, pt now reporting improvement with vertigo, but still unsteady and experiencing falls  Non-Vestibular symptoms: changes in hearing, neck pain, and tinnitus  Type of dizziness: feels inside of her head move  Frequency: with position  Duration: minutes  Aggravating factors:   Relieving factors:  remaining still  Progression of symptoms: better  OCULOMOTOR EXAM:  Ocular Alignment: normal  Ocular ROM: No Limitations  Spontaneous Nystagmus: absent  Gaze-Induced Nystagmus: absent  Smooth Pursuits: saccades  Saccades: extra eye movements  Convergence/Divergence: WNL    VESTIBULAR -  OCULAR REFLEX:   Slow VOR: Normal   Head-Impulse Test: HIT Right: positive, Left:negative - reported mild hearing loss on R side as well    POSITIONAL TESTING: Right Dix-Hallpike: no nystagmus Left Dix-Hallpike: no nystagmus Right Roll Test: no nystagmus Left Roll Test: no nystagmus    OTHOSTATICS: not done, deferred  FUNCTIONAL GAIT: Dynamic Gait Index: deferred   VESTIBULAR TREATMENT:                                                                                                   DATE:   TherEx: MMT grossly 4+/5 in BLE  MMT UE: deferred secondary to recent cortisone injection  NMR: DGI - 16/24, indicates  increased fall risk -- The pt feels a bit out of breath with DGI testing, requires seated rest break for symptoms to improve. Pt SPO2% 98%, HR 82 bpm. Pt dizziness reaches around 5/10 with vertical and horizontal head turns part of testing. Symptoms improve quickly  Seated VORx1: -horizontal head turns x 30 sec, x 45 sec. Pt reports slight dizziness - Vertical head turns x 30 sec, x 45 sec Pt repots no dizziness   M-CTSIB: successfully completes condition 1, pt unable to complete conditions 2, 3 and 4   PATIENT EDUCATION: Education details: further assessment findings, indications, exercise technique Person educated: Patient Education method: Explanation, Demonstration, and Verbal cues Education comprehension: verbalized understanding, returned demonstration, verbal cues required, and needs further education  HOME EXERCISE PROGRAM: Access Code: [email or web address] URL: [email or web address] Date: 03/01/2022 Prepared by: Ricard Dillon  Exercises - Seated Gaze Stabilization with Head Rotation  - 1 x daily - 7 x weekly - 3 sets - 2 reps - 30 sec  hold - Seated Gaze Stabilization with Head Nod  - 1 x daily - 7 x weekly - 3 sets - 2 reps - 30 sec hold  GOALS: Goals reviewed with patient? Yes  SHORT TERM GOALS: Target date: 05/08/2022   Patient will be  independent in home exercise program to improve strength/mobility for better functional independence with ADLs. Baseline: initiated Goal status: INITIAL  LONG TERM GOALS: Target date: 06/19/2022    Patient will increase FOTO score to equal to or greater than   53  to demonstrate statistically significant improvement in mobility and quality of life.  Baseline: 46 Goal status: INITIAL  2.  Patient will reduce dizziness handicap inventory score to <50, for less dizziness with ADLs and increased safety with home and work tasks.  Baseline:  52 Goal status: INITIAL  3.  Patient will increase ABC scale score >80% to demonstrate better functional mobility and better confidence with ADLs.  Baseline: 24.4% Goal status: INITIAL  4.  Patient will increase dynamic gait index score to >19/24 as to demonstrate reduced fall risk and improved dynamic gait balance for better safety with community/home ambulation.   Baseline: deferred due to time; 03/27/22: 16/24 Goal status: INITIAL   ASSESSMENT:  CLINICAL IMPRESSION: Further assessment completed today. Pt DGI score (16/24), indicates increased fall risk. Pt also unable to complete conditions 2-3 of M-CTSIB, indicating impairment in ability to use proprioceptive and vestibular cues to maintain balance, and that pt likely relying heavily on visual input for balance. Pt did report while she has not consistently performed VOR HEP, she feels it has improved her dizziness and balance since last seen. The pt will benefit from further skilled vestibular rehab to improve balance, positionally provoked dizziness and to decrease fall risk and increase QOL.   OBJECTIVE IMPAIRMENTS: Abnormal gait, decreased balance, decreased mobility, difficulty walking, dizziness, improper body mechanics, postural dysfunction, obesity, and pain.   ACTIVITY LIMITATIONS: bending, standing, squatting, stairs, transfers, bed mobility, reach over head, and locomotion  level  PARTICIPATION LIMITATIONS: cleaning, laundry, shopping, community activity, and yard work  PERSONAL FACTORS: Age, Sex, Social background, and 3+ comorbidities: PMH significant for allergies, back pain, CHF, COPD, gout, degenerative joint disease of L knee, depression, DM, HTN, hypothyroidism, IBS, insomnia, neuropathy feet and legs, oxygen dependent, PVD, sleep apnea, sinusitis chronic, vitamin D deficiency, iron deficiency anemia, breast biopsy R 02/22/2022   are also affecting patient's functional outcome.   REHAB POTENTIAL: Good  CLINICAL DECISION MAKING: Evolving/moderate complexity  EVALUATION COMPLEXITY: Moderate   PLAN:  PT FREQUENCY: 2x/week  PT DURATION: 12 weeks  PLANNED INTERVENTIONS: Therapeutic exercises, Therapeutic activity, Neuromuscular re-education, Balance training, Gait training, Patient/Family education, Self Care, Joint mobilization, Stair training, Vestibular training, Canalith repositioning, Visual/preceptual remediation/compensation, Orthotic/Fit training, DME instructions, Electrical stimulation, Wheelchair mobility training, Spinal mobilization, Cryotherapy, Moist heat, Splintting, Taping, Ultrasound, Manual therapy, and Re-evaluation  PLAN FOR NEXT SESSION: VOR, endurance, balance, DVA, orthostatics    Zollie Pee, PT 03/27/2022, 5:21 PM

## 2022-03-28 ENCOUNTER — Ambulatory Visit: Payer: Medicare Other

## 2022-03-29 ENCOUNTER — Ambulatory Visit: Payer: 59

## 2022-03-29 ENCOUNTER — Ambulatory Visit: Payer: Medicare Other

## 2022-03-29 NOTE — Therapy (Incomplete)
OUTPATIENT PHYSICAL THERAPY VESTIBULAR TREATMENT NOTE     Patient Name: [name] [name] MRN: [id number] DOB:31-Oct-1955, 67 y.o., female Today's Date: 03/27/2022  END OF SESSION:  PT End of Session - 03/27/22 1713     Visit Number 2    Number of Visits 25    Date for PT Re-Evaluation 05/24/22    PT Start Time 1440    PT Stop Time 1516    PT Time Calculation (min) 36 min    Equipment Utilized During Treatment Gait belt    Activity Tolerance Patient tolerated treatment well;No increased pain    Behavior During Therapy WFL for tasks assessed/performed             Past Medical History:  Diagnosis Date   Allergic rhinitis    Allergy    Asthma    Back pain    CHF (congestive heart failure) (HCC)    COPD (chronic obstructive pulmonary disease) (HCC)    Degenerative joint disease of knee, left    Depression    Diabetes mellitus without complication (HCC)    Edema    Elevated lipids    GERD (gastroesophageal reflux disease)    Hidradenitis    History of colonic polyps    Hypertension    Hypothyroidism    IBS (irritable bowel syndrome)    Insomnia    Neuropathy    feet and legs   Obesity    Oxygen dependent    Pneumonia    PONV (postoperative nausea and vomiting)    PVD (peripheral vascular disease) (HCC)    Sinusitis, chronic    Sleep apnea    Thyroid disease    Vaginitis, atrophic    Vertigo    Vitamin D deficiency    Past Surgical History:  Procedure Laterality Date   ABDOMINAL HYSTERECTOMY     AXILLARY HIDRADENITIS EXCISION     BREAST BIOPSY Right 02/22/2022   stereo bx, calcs, "X" clip- path pending   BREAST BIOPSY Right 02/22/2022   MM RT BREAST BX W LOC DEV 1ST LESION IMAGE BX SPEC STEREO GUIDE 02/22/2022 [facility]   BREAST EXCISIONAL BIOPSY Bilateral 40/3474   RUPTURED FOLLICULAR CYSTS WITH ABSCESSES AND SCARRING, CONSISTENT WITH HIDRADENITIS SUPPURATIVA.    CATARACT EXTRACTION W/PHACO Right 07/27/2020   Procedure: CATARACT EXTRACTION PHACO AND  INTRAOCULAR LENS PLACEMENT (IOC) RIGHT DIABETIC 17.13 01:23.9;  Surgeon: Birder Robson, MD;  Location: [place];  Service: Ophthalmology;  Laterality: Right;  Diabetic - insulin  sleep apnea wants to be last   CESAREAN SECTION     x 2   CHOLECYSTECTOMY     COLONOSCOPY WITH PROPOFOL N/A 09/15/2021   Procedure: COLONOSCOPY WITH PROPOFOL;  Surgeon: Lin Landsman, MD;  Location: [facility];  Service: Gastroenterology;  Laterality: N/A;   HEEL SPUR EXCISION N/A    HYDRADENITIS EXCISION Right 12/31/2015   Procedure: EXCISION HIDRADENITIS AXILLA;  Surgeon: Clayburn Pert, MD;  Location: [facility];  Service: General;  Laterality: Right;   KNEE SURGERY Left    1998   TONSILLECTOMY     Patient Active Problem List   Diagnosis Date Noted   OSA (obstructive sleep apnea) 07/12/2021   Rosacea 05/30/2019   COPD (chronic obstructive pulmonary disease) ([place]) 08/25/2016   Acute on chronic heart failure with normal ejection fraction (HCC) 08/20/2016   Chronic diastolic heart failure ([place]) 06/02/2016   Bilateral lower leg cellulitis 05/23/2016   Leukocytosis 05/23/2016   Iron deficiency anemia 05/23/2016   Thrombocytosis 05/23/2016   COPD with  acute exacerbation ([place]) 05/21/2016   Pulmonary hypertension ([place]) 12/14/2015   Pneumonia 09/08/2015   Breast abscess 06/02/2015   Abnormal mammogram of both breasts 02/25/2015   Hidradenitis 12/30/2014   Diabetes ([place]) 12/30/2014   Essential hypertension 12/30/2014   Asthma 12/30/2014    PCP:  Denton Lank, MD     REFERRING PROVIDER:  Denton Lank, MD      REFERRING DIAG: R42 (ICD-10-CM) - Dizziness and giddiness   THERAPY DIAG:  Dizziness and giddiness  Unsteadiness on feet  Difficulty in walking, not elsewhere classified  Muscle weakness (generalized)  ONSET DATE: ongoing for 10 years  Rationale for Evaluation and Treatment: Rehabilitation  SUBJECTIVE:   SUBJECTIVE STATEMENT: Pt arrives early (wrong appt  time), however PT with opening and able to see pt early. The pt reports she just had cortisone shot in L shoulder and is getting one in RUE next week. Pt reports no stumbles/falls since last appointment. She reports she has been inconsistently doing HEP. Pt reports dizziness not as bad as before and states she has "not really" had any spinning. She thinks HEP is helping to improve her symptoms, feels balance is better.   Pt accompanied by: self  PERTINENT HISTORY:   Pt is a 67 yo female referred for ongoing dizziness of the past 10 years that has been improving since being treated with L Epley by ENT. Pt confirms the following regarding symptoms: tinnitus, thinks she could still trigger her symptoms with looking down, positionally provoked, occurs with lying flat, turning to L or R side, bending forward and looking down, pt with mild hearing loss per audiogram and per pt it is her R ear that has decreased hearing. Duration of symptoms when they occur is minutes, never lasts longer than this. Her symptoms improve if she stays still. Per referral pt negative for recurrent otis externa and media. Hx of taking Meclizine (taking prn). Referral reports pt positive for BPPV of L side with treatment of Epley.   Positions/activities that bring on symptoms: bending, turning head quickly, laying down (tilts bed up), looking up.  Pt has to look out the window in a car or she feels dizzy, reports scrolling on a phone does not make her dizzy. Pt denies recent lightheadedness, pt reports no ear pain.  PMH significant for allergies, back pain, CHF, COPD, gout, degenerative joint disease of L knee, depression, DM, HTN, hypothyroidism, IBS, insomnia, neuropathy feet and legs, oxygen dependent, PVD, sleep apnea, sinusitis chronic, vitamin D deficiency, iron deficiency anemia, breast biopsy R 02/22/2022   PAIN:  Are you having pain? No and Yes: NPRS scale: posterior neck soreness/not rated/10 Pain location: Some soreness  felt posterior neck, L side neck Pain description: chronic Aggravating factors:   Relieving factors:    PRECAUTIONS: Fall  WEIGHT BEARING RESTRICTIONS:  none  FALLS: Has patient fallen in last 6 months? Yes. Number of falls 7-8  LIVING ENVIRONMENT: Lives with:  Lives in:  Stairs:  Has following equipment at home:   PLOF: Independent  PATIENT GOALS: I don't want to be dizzy, I don't want to fall. I want to improve my walking   OBJECTIVE:   DIAGNOSTIC FINDINGS:  No pertinent recent imaging per chart  COGNITION: Overall cognitive status: Within functional limits for tasks assessed   SENSATION: deferred  POSTURE:  rounded shoulders and forward head   LOWER EXTREMITY MMT: deferred   BED MOBILITY:  Limited due to dizziness, reports sleeps slightly reclined  TRANSFERS: Assistive device utilized: Environmental consultant -  4 wheeled  Sit to stand: Modified independence Stand to sit: Modified independence   GAIT: Gait pattern: decreased stride length Distance walked: clinic distances Assistive device utilized: Environmental consultant - 4 wheeled Level of assistance: SBA and CGA   FUNCTIONAL TESTS:  DGI and M-CTSIB to be performed future session   PATIENT SURVEYS:  ABC scale 24.4% DHI 52 FOTO 53  VESTIBULAR ASSESSMENT: SYMPTOM BEHAVIOR:  Subjective history: Recently seen by ENT and treated for L side BPPV, pt now reporting improvement with vertigo, but still unsteady and experiencing falls  Non-Vestibular symptoms: changes in hearing, neck pain, and tinnitus  Type of dizziness: feels inside of her head move  Frequency: with position  Duration: minutes  Aggravating factors:   Relieving factors:  remaining still  Progression of symptoms: better  OCULOMOTOR EXAM:  Ocular Alignment: normal  Ocular ROM: No Limitations  Spontaneous Nystagmus: absent  Gaze-Induced Nystagmus: absent  Smooth Pursuits: saccades  Saccades: extra eye movements  Convergence/Divergence: WNL    VESTIBULAR -  OCULAR REFLEX:   Slow VOR: Normal   Head-Impulse Test: HIT Right: positive, Left:negative - reported mild hearing loss on R side as well    POSITIONAL TESTING: Right Dix-Hallpike: no nystagmus Left Dix-Hallpike: no nystagmus Right Roll Test: no nystagmus Left Roll Test: no nystagmus    OTHOSTATICS: not done, deferred  FUNCTIONAL GAIT: Dynamic Gait Index: deferred   VESTIBULAR TREATMENT:                                                                                                   DATE:   Monitor SPO2 throughout Orthostatics if indicate:  VRT with head turns, VOR,  Nustep endurance   BLE strengthening:  prev TherEx: MMT grossly 4+/5 in BLE  MMT UE: deferred secondary to recent cortisone injection  NMR: DGI - 16/24, indicates  increased fall risk -- The pt feels a bit out of breath with DGI testing, requires seated rest break for symptoms to improve. Pt SPO2% 98%, HR 82 bpm. Pt dizziness reaches around 5/10 with vertical and horizontal head turns part of testing. Symptoms improve quickly  Seated VORx1: -horizontal head turns x 30 sec, x 45 sec. Pt reports slight dizziness - Vertical head turns x 30 sec, x 45 sec Pt repots no dizziness   M-CTSIB: successfully completes condition 1, pt unable to complete conditions 2, 3 and 4   PATIENT EDUCATION: Education details: further assessment findings, indications, exercise technique Person educated: Patient Education method: Explanation, Demonstration, and Verbal cues Education comprehension: verbalized understanding, returned demonstration, verbal cues required, and needs further education  HOME EXERCISE PROGRAM: Access Code: [email or web address] URL: [email or web address] Date: 03/01/2022 Prepared by: Ricard Dillon  Exercises - Seated Gaze Stabilization with Head Rotation  - 1 x daily - 7 x weekly - 3 sets - 2 reps - 30 sec  hold - Seated Gaze Stabilization with Head Nod  - 1 x daily - 7 x weekly - 3 sets - 2 reps  - 30 sec hold  GOALS: Goals reviewed with patient? Yes  SHORT TERM GOALS: Target date: 05/08/2022   Patient will be  independent in home exercise program to improve strength/mobility for better functional independence with ADLs. Baseline: initiated Goal status: INITIAL   LONG TERM GOALS: Target date: 06/19/2022    Patient will increase FOTO score to equal to or greater than   53  to demonstrate statistically significant improvement in mobility and quality of life.  Baseline: 46 Goal status: INITIAL  2.  Patient will reduce dizziness handicap inventory score to <50, for less dizziness with ADLs and increased safety with home and work tasks.  Baseline:  52 Goal status: INITIAL  3.  Patient will increase ABC scale score >80% to demonstrate better functional mobility and better confidence with ADLs.  Baseline: 24.4% Goal status: INITIAL  4.  Patient will increase dynamic gait index score to >19/24 as to demonstrate reduced fall risk and improved dynamic gait balance for better safety with community/home ambulation.   Baseline: deferred due to time; 03/27/22: 16/24 Goal status: INITIAL   ASSESSMENT:  CLINICAL IMPRESSION: Further assessment completed today. Pt DGI score (16/24), indicates increased fall risk. Pt also unable to complete conditions 2-3 of M-CTSIB, indicating impairment in ability to use proprioceptive and vestibular cues to maintain balance, and that pt likely relying heavily on visual input for balance. Pt did report while she has not consistently performed VOR HEP, she feels it has improved her dizziness and balance since last seen. The pt will benefit from further skilled vestibular rehab to improve balance, positionally provoked dizziness and to decrease fall risk and increase QOL.   OBJECTIVE IMPAIRMENTS: Abnormal gait, decreased balance, decreased mobility, difficulty walking, dizziness, improper body mechanics, postural dysfunction, obesity, and pain.   ACTIVITY  LIMITATIONS: bending, standing, squatting, stairs, transfers, bed mobility, reach over head, and locomotion level  PARTICIPATION LIMITATIONS: cleaning, laundry, shopping, community activity, and yard work  PERSONAL FACTORS: Age, Sex, Social background, and 3+ comorbidities: PMH significant for allergies, back pain, CHF, COPD, gout, degenerative joint disease of L knee, depression, DM, HTN, hypothyroidism, IBS, insomnia, neuropathy feet and legs, oxygen dependent, PVD, sleep apnea, sinusitis chronic, vitamin D deficiency, iron deficiency anemia, breast biopsy R 02/22/2022   are also affecting patient's functional outcome.   REHAB POTENTIAL: Good  CLINICAL DECISION MAKING: Evolving/moderate complexity  EVALUATION COMPLEXITY: Moderate   PLAN:  PT FREQUENCY: 2x/week  PT DURATION: 12 weeks  PLANNED INTERVENTIONS: Therapeutic exercises, Therapeutic activity, Neuromuscular re-education, Balance training, Gait training, Patient/Family education, Self Care, Joint mobilization, Stair training, Vestibular training, Canalith repositioning, Visual/preceptual remediation/compensation, Orthotic/Fit training, DME instructions, Electrical stimulation, Wheelchair mobility training, Spinal mobilization, Cryotherapy, Moist heat, Splintting, Taping, Ultrasound, Manual therapy, and Re-evaluation  PLAN FOR NEXT SESSION: VOR, endurance, balance,   Zollie Pee, PT 03/27/2022, 5:21 PM

## 2022-04-03 ENCOUNTER — Ambulatory Visit: Payer: 59

## 2022-04-03 DIAGNOSIS — R42 Dizziness and giddiness: Secondary | ICD-10-CM | POA: Diagnosis not present

## 2022-04-03 DIAGNOSIS — M6281 Muscle weakness (generalized): Secondary | ICD-10-CM

## 2022-04-03 DIAGNOSIS — R262 Difficulty in walking, not elsewhere classified: Secondary | ICD-10-CM

## 2022-04-03 DIAGNOSIS — R2681 Unsteadiness on feet: Secondary | ICD-10-CM

## 2022-04-03 NOTE — Therapy (Signed)
OUTPATIENT PHYSICAL THERAPY VESTIBULAR TREATMENT NOTE     Patient Name: [name] [name] MRN: [id number] DOB:[birth year], 67 y.o., female Today's Date: 04/03/2022  END OF SESSION:  PT End of Session - 04/03/22 1418     Visit Number 3    Number of Visits 25    Date for PT Re-Evaluation 05/24/22    PT Start Time 1330    PT Stop Time 1414    PT Time Calculation (min) 44 min    Equipment Utilized During Treatment Gait belt    Activity Tolerance Patient tolerated treatment well;No increased pain    Behavior During Therapy WFL for tasks assessed/performed              Past Medical History:  Diagnosis Date   Allergic rhinitis    Allergy    Asthma    Back pain    CHF (congestive heart failure) (HCC)    COPD (chronic obstructive pulmonary disease) (HCC)    Degenerative joint disease of knee, left    Depression    Diabetes mellitus without complication (HCC)    Edema    Elevated lipids    GERD (gastroesophageal reflux disease)    Hidradenitis    History of colonic polyps    Hypertension    Hypothyroidism    IBS (irritable bowel syndrome)    Insomnia    Neuropathy    feet and legs   Obesity    Oxygen dependent    Pneumonia    PONV (postoperative nausea and vomiting)    PVD (peripheral vascular disease) (HCC)    Sinusitis, chronic    Sleep apnea    Thyroid disease    Vaginitis, atrophic    Vertigo    Vitamin D deficiency    Past Surgical History:  Procedure Laterality Date   ABDOMINAL HYSTERECTOMY     AXILLARY HIDRADENITIS EXCISION     BREAST BIOPSY Right 02/22/2022   stereo bx, calcs, "X" clip- path pending   BREAST BIOPSY Right 02/22/2022   MM RT BREAST BX W LOC DEV 1ST LESION IMAGE BX SPEC STEREO GUIDE 02/22/2022 [facility]   BREAST EXCISIONAL BIOPSY Bilateral 76/7341   RUPTURED FOLLICULAR CYSTS WITH ABSCESSES AND SCARRING, CONSISTENT WITH HIDRADENITIS SUPPURATIVA.    CATARACT EXTRACTION W/PHACO Right 07/27/2020   Procedure: CATARACT EXTRACTION PHACO  AND INTRAOCULAR LENS PLACEMENT (IOC) RIGHT DIABETIC 17.13 01:23.9;  Surgeon: Birder Robson, MD;  Location: [facility];  Service: Ophthalmology;  Laterality: Right;  Diabetic - insulin  sleep apnea wants to be last   CESAREAN SECTION     x 2   CHOLECYSTECTOMY     COLONOSCOPY WITH PROPOFOL N/A 09/15/2021   Procedure: COLONOSCOPY WITH PROPOFOL;  Surgeon: Lin Landsman, MD;  Location: [facility] ENDOSCOPY;  Service: Gastroenterology;  Laterality: N/A;   HEEL SPUR EXCISION N/A    HYDRADENITIS EXCISION Right 12/31/2015   Procedure: EXCISION HIDRADENITIS AXILLA;  Surgeon: Clayburn Pert, MD;  Location: [facility];  Service: General;  Laterality: Right;   KNEE SURGERY Left    1998   TONSILLECTOMY     Patient Active Problem List   Diagnosis Date Noted   OSA (obstructive sleep apnea) 07/12/2021   Rosacea 05/30/2019   COPD (chronic obstructive pulmonary disease) ([place]) 08/25/2016   Acute on chronic heart failure with normal ejection fraction (HCC) 08/20/2016   Chronic diastolic heart failure ([place]) 06/02/2016   Bilateral lower leg cellulitis 05/23/2016   Leukocytosis 05/23/2016   Iron deficiency anemia 05/23/2016   Thrombocytosis 05/23/2016   COPD  with acute exacerbation ([place]) 05/21/2016   Pulmonary hypertension ([place]) 12/14/2015   Pneumonia 09/08/2015   Breast abscess 06/02/2015   Abnormal mammogram of both breasts 02/25/2015   Hidradenitis 12/30/2014   Diabetes ([place]) 12/30/2014   Essential hypertension 12/30/2014   Asthma 12/30/2014    PCP:  Denton Lank, MD     REFERRING PROVIDER:  Denton Lank, MD      REFERRING DIAG: R42 (ICD-10-CM) - Dizziness and giddiness   THERAPY DIAG:  Unsteadiness on feet  Dizziness and giddiness  Difficulty in walking, not elsewhere classified  Muscle weakness (generalized)  ONSET DATE: ongoing for 10 years  Rationale for Evaluation and Treatment: Rehabilitation  SUBJECTIVE:   SUBJECTIVE STATEMENT: Pt reports another fall  since last appointment, did not get injured or hit her head. She says her grandson helped her up. She tripped over a vacuum cable, was not using RW. PT recommends she use RW in her home too at this time. She verbalizes understanding. Pt reports lightheadedness currently, says lightheadedness/dizziness is the same. Feeling. She reports no pain currently.  Pt accompanied by: self  PERTINENT HISTORY:   Pt is a 67 yo female referred for ongoing dizziness of the past 10 years that has been improving since being treated with L Epley by ENT. Pt confirms the following regarding symptoms: tinnitus, thinks she could still trigger her symptoms with looking down, positionally provoked, occurs with lying flat, turning to L or R side, bending forward and looking down, pt with mild hearing loss per audiogram and per pt it is her R ear that has decreased hearing. Duration of symptoms when they occur is minutes, never lasts longer than this. Her symptoms improve if she stays still. Per referral pt negative for recurrent otis externa and media. Hx of taking Meclizine (taking prn). Referral reports pt positive for BPPV of L side with treatment of Epley.   Positions/activities that bring on symptoms: bending, turning head quickly, laying down (tilts bed up), looking up.  Pt has to look out the window in a car or she feels dizzy, reports scrolling on a phone does not make her dizzy. Pt denies recent lightheadedness, pt reports no ear pain.  PMH significant for allergies, back pain, CHF, COPD, gout, degenerative joint disease of L knee, depression, DM, HTN, hypothyroidism, IBS, insomnia, neuropathy feet and legs, oxygen dependent, PVD, sleep apnea, sinusitis chronic, vitamin D deficiency, iron deficiency anemia, breast biopsy R 02/22/2022   PAIN:  Are you having pain? No and Yes: NPRS scale: posterior neck soreness/not rated/10 Pain location: Some soreness felt posterior neck, L side neck Pain description:  chronic Aggravating factors:   Relieving factors:    PRECAUTIONS: Fall  WEIGHT BEARING RESTRICTIONS:  none  FALLS: Has patient fallen in last 6 months? Yes. Number of falls 7-8  LIVING ENVIRONMENT: Lives with:  Lives in:  Stairs:  Has following equipment at home:   PLOF: Independent  PATIENT GOALS: I don't want to be dizzy, I don't want to fall. I want to improve my walking   OBJECTIVE:   DIAGNOSTIC FINDINGS:  No pertinent recent imaging per chart  COGNITION: Overall cognitive status: Within functional limits for tasks assessed   SENSATION: deferred  POSTURE:  rounded shoulders and forward head   LOWER EXTREMITY MMT: deferred   BED MOBILITY:  Limited due to dizziness, reports sleeps slightly reclined  TRANSFERS: Assistive device utilized: Walker - 4 wheeled  Sit to stand: Modified independence Stand to sit: Modified independence   GAIT: Gait pattern:  decreased stride length Distance walked: clinic distances Assistive device utilized: Environmental consultant - 4 wheeled Level of assistance: SBA and CGA   FUNCTIONAL TESTS:  DGI and M-CTSIB to be performed future session   PATIENT SURVEYS:  ABC scale 24.4% DHI 52 FOTO 53  VESTIBULAR ASSESSMENT: SYMPTOM BEHAVIOR:  Subjective history: Recently seen by ENT and treated for L side BPPV, pt now reporting improvement with vertigo, but still unsteady and experiencing falls  Non-Vestibular symptoms: changes in hearing, neck pain, and tinnitus  Type of dizziness: feels inside of her head move  Frequency: with position  Duration: minutes  Aggravating factors:   Relieving factors:  remaining still  Progression of symptoms: better  OCULOMOTOR EXAM:  Ocular Alignment: normal  Ocular ROM: No Limitations  Spontaneous Nystagmus: absent  Gaze-Induced Nystagmus: absent  Smooth Pursuits: saccades  Saccades: extra eye movements  Convergence/Divergence: WNL    VESTIBULAR - OCULAR REFLEX:   Slow VOR: Normal   Head-Impulse  Test: HIT Right: positive, Left:negative - reported mild hearing loss on R side as well    POSITIONAL TESTING: Right Dix-Hallpike: no nystagmus Left Dix-Hallpike: no nystagmus Right Roll Test: no nystagmus Left Roll Test: no nystagmus    OTHOSTATICS: not done, deferred  FUNCTIONAL GAIT: Dynamic Gait Index: deferred   VESTIBULAR TREATMENT:                                                                                                   DATE:   Gait belt donned and CGA provided unless otherwise stated NMR: In // bars  Static stand, WBOS with vertical and horizontal head turns 3x10 for each. Requires intermittent UE support and min assist due to unsteadiness to correct for LOB. Lightheaded/dizziness reaches 5-6/10.   NBOS EO 30 sec - increased sway NBOS EC 1x30 sec, 1x 60 sec requires finger-touch support and up to min a to correct for LOB  Semi-tandem gait FWD then followed by retro-gait in // bars x multiple reps length of bars. Initially attempted without UE support, requires min a and at least UUE support to maintain balance. Very challenging.  Standing WBOS with UUE support on bar, VORx1 with vertical and horizontal head turns 2x30 sec of each, requires up to min a, TC for technique  with each  Gait with vertical and horizontal head turns 10x for each, performed length each of bars. Requires at least UUE support and min a due to dizziness and unsteadiness.   TherEx: Elastogel donned to B shoulders while pt performs the following (reports elastogel feels good, no adverse reaction to treatment, skin WNL prior to placement and upon removal of elastogel) STS 3x6  - pt steady, rates easy  2.5# AW donned each LE LAQ 3x10 each LE  March 1x15, 1x5 each LE. Challenging        PATIENT EDUCATION: Education details:Pt educated throughout session about proper posture and technique with exercises. Improved exercise technique, movement at target joints, use of target muscles after  min to mod verbal, visual, tactile cues. Plan for next visit  Person educated: Patient Education method: Explanation, Demonstration, and Verbal cues  Education comprehension: verbalized understanding, returned demonstration, verbal cues required, and needs further education  HOME EXERCISE PROGRAM: Access Code: [email or web address] URL: [email or web address] Date: 03/01/2022 Prepared by: Ricard Dillon  Exercises - Seated Gaze Stabilization with Head Rotation  - 1 x daily - 7 x weekly - 3 sets - 2 reps - 30 sec  hold - Seated Gaze Stabilization with Head Nod  - 1 x daily - 7 x weekly - 3 sets - 2 reps - 30 sec hold  GOALS: Goals reviewed with patient? Yes  SHORT TERM GOALS: Target date: 05/15/2022   Patient will be independent in home exercise program to improve strength/mobility for better functional independence with ADLs. Baseline: initiated Goal status: INITIAL   LONG TERM GOALS: Target date: 06/26/2022    Patient will increase FOTO score to equal to or greater than   53  to demonstrate statistically significant improvement in mobility and quality of life.  Baseline: 46 Goal status: INITIAL  2.  Patient will reduce dizziness handicap inventory score to <50, for less dizziness with ADLs and increased safety with home and work tasks.  Baseline:  52 Goal status: INITIAL  3.  Patient will increase ABC scale score >80% to demonstrate better functional mobility and better confidence with ADLs.  Baseline: 24.4% Goal status: INITIAL  4.  Patient will increase dynamic gait index score to >19/24 as to demonstrate reduced fall risk and improved dynamic gait balance for better safety with community/home ambulation.   Baseline: deferred due to time; 03/27/22: 16/24 Goal status: INITIAL   ASSESSMENT:  CLINICAL IMPRESSION: Pt with 1 fall since last seen. She also presents with reports of ongoing lightheaded/dizzy sensations. Pt noted to be challenged with all activities involving head  turns, including VOR. She generally required min assist and at least UUE support to prevent LOB. Due to recent fall and increased difficulty with head-turn activities, PT and pt discussed performing positional testing again next visit to confirm there is no new peripheral issue. The pt agreeable to plan and verbalized understanding. The pt will benefit from further skilled vestibular rehab to improve balance, positionally provoked dizziness and to decrease fall risk and increase QOL.   OBJECTIVE IMPAIRMENTS: Abnormal gait, decreased balance, decreased mobility, difficulty walking, dizziness, improper body mechanics, postural dysfunction, obesity, and pain.   ACTIVITY LIMITATIONS: bending, standing, squatting, stairs, transfers, bed mobility, reach over head, and locomotion level  PARTICIPATION LIMITATIONS: cleaning, laundry, shopping, community activity, and yard work  PERSONAL FACTORS: Age, Sex, Social background, and 3+ comorbidities: PMH significant for allergies, back pain, CHF, COPD, gout, degenerative joint disease of L knee, depression, DM, HTN, hypothyroidism, IBS, insomnia, neuropathy feet and legs, oxygen dependent, PVD, sleep apnea, sinusitis chronic, vitamin D deficiency, iron deficiency anemia, breast biopsy R 02/22/2022   are also affecting patient's functional outcome.   REHAB POTENTIAL: Good  CLINICAL DECISION MAKING: Evolving/moderate complexity  EVALUATION COMPLEXITY: Moderate   PLAN:  PT FREQUENCY: 2x/week  PT DURATION: 12 weeks  PLANNED INTERVENTIONS: Therapeutic exercises, Therapeutic activity, Neuromuscular re-education, Balance training, Gait training, Patient/Family education, Self Care, Joint mobilization, Stair training, Vestibular training, Canalith repositioning, Visual/preceptual remediation/compensation, Orthotic/Fit training, DME instructions, Electrical stimulation, Wheelchair mobility training, Spinal mobilization, Cryotherapy, Moist heat, Splintting, Taping,  Ultrasound, Manual therapy, and Re-evaluation  PLAN FOR NEXT SESSION: VOR, endurance, balance, positional testing   Zollie Pee, PT 04/03/2022, 2:30 PM

## 2022-04-04 ENCOUNTER — Ambulatory Visit
Admission: RE | Admit: 2022-04-04 | Discharge: 2022-04-04 | Disposition: A | Discharge status: Home / Self Care | Payer: 59 | Source: Ambulatory Visit | Attending: Family Medicine | Admitting: Family Medicine

## 2022-04-04 DIAGNOSIS — R928 Other abnormal and inconclusive findings on diagnostic imaging of breast: Secondary | ICD-10-CM

## 2022-04-04 DIAGNOSIS — R921 Mammographic calcification found on diagnostic imaging of breast: Secondary | ICD-10-CM | POA: Diagnosis present

## 2022-04-04 HISTORY — PX: BREAST BIOPSY: SHX20

## 2022-04-04 MED ORDER — LIDOCAINE-EPINEPHRINE 1 %-1:100000 IJ SOLN
20.0000 mL | Freq: Once | INTRAMUSCULAR | Status: AC
  Administered 2022-04-04: 20 mL

## 2022-04-04 MED ORDER — LIDOCAINE HCL (PF) 1 % IJ SOLN
5.0000 mL | Freq: Once | INTRAMUSCULAR | Status: AC
  Administered 2022-04-04: 5 mL

## 2022-04-05 ENCOUNTER — Ambulatory Visit: Payer: Medicare Other

## 2022-04-05 LAB — SURGICAL PATHOLOGY

## 2022-04-06 ENCOUNTER — Ambulatory Visit: Payer: 59

## 2022-04-06 ENCOUNTER — Encounter: Payer: Self-pay | Admitting: Ophthalmology

## 2022-04-07 ENCOUNTER — Encounter: Payer: Self-pay | Admitting: *Deleted

## 2022-04-07 NOTE — Progress Notes (Signed)
Referral recieved from [facility] for benign breast mass.  Called Ms. [name] [name] to discuss surgeon referral.  Voicemail has been left x2 for return call.

## 2022-04-10 ENCOUNTER — Ambulatory Visit: Payer: Medicare Other

## 2022-04-10 ENCOUNTER — Encounter: Payer: Self-pay | Admitting: *Deleted

## 2022-04-10 DIAGNOSIS — D241 Benign neoplasm of right breast: Secondary | ICD-10-CM

## 2022-04-10 NOTE — Progress Notes (Signed)
Spoke with Ms. [name] [name], referral will be sent to [facility].  Their office will call with the appt.

## 2022-04-10 NOTE — Discharge Instructions (Signed)
   Cataract Surgery, Care After ? ?This sheet gives you information about how to care for yourself after your surgery.  Your ophthalmologist may also give you more specific instructions.  If you have problems or questions, contact your doctor at [facility], [phone]. ? ?What can I expect after the surgery? ?It is common to have: ?Itching ?Foreign body sensation (feels like a grain of sand in the eye) ?Watery discharge (excess tearing) ?Sensitivity to light and touch ?Bruising in or around the eye ?Mild blurred vision ? ?Follow these instructions at home: ?Do not touch or rub your eyes. ?You may be told to wear a protective shield or sunglasses to protect your eyes. ?Do not put a contact lens in the operative eye unless your doctor approves. ?Keep the lids and face clean and dry. ?Do not allow water to hit you directly in the face while showering. ?Keep soap and shampoo out of your eyes. ?Do not use eye makeup for 1 week. ? ?Check your eye every day for signs of infection.  Watch for: ?Redness, swelling, or pain. ?Fluid, blood or pus. ?Worsening vision. ?Worsening sensitivity to light or touch. ? ?Activity: ?During the first day, avoid bending over and reading.  You may resume reading and bending the next day. ?Do not drive or use heavy machinery for at least 24 hours. ?Avoid strenuous activities for 1 week.  Activities such as walking, treadmill, exercise bike, and climbing stairs are okay. ?Do not lift heavy (>20 pound) objects for 1 week. ?Do not do yardwork, gardening, or dirty housework (mopping, cleaning bathrooms, vacuuming, etc.) for 1 week. ?Do not swim or use a hot tub for 2 weeks. ?Ask your doctor when you can return to work. ? ?General Instructions: ?Take or apply prescription and over-the-counter medicines as directed by your doctor, including eyedrops and ointments. ?Resume medications discontinued prior to surgery, unless told otherwise by your doctor. ?Keep all follow up appointments as  scheduled. ? ?Contact a health care provider if: ?You have increased bruising around your eye. ?You have pain that is not helped with medication. ?You have a fever. ?You have fluid, pus, or blood coming from your eye or incision. ?Your sensitivity to light gets worse. ?You have spots (floaters) of flashing lights in your vision. ?You have nausea or vomiting. ? ?Go to the nearest emergency room or call 911 if: ?You have sudden loss of vision. ?You have severe, worsening eye pain. ? ?

## 2022-04-10 NOTE — Anesthesia Preprocedure Evaluation (Addendum)
Anesthesia Evaluation  Patient identified by MRN, date of birth, ID band Patient awake    Reviewed: Allergy & Precautions, NPO status , Patient's Chart, lab work & pertinent test results  History of Anesthesia Complications (+) PONV and history of anesthetic complications  Airway Mallampati: IV   Neck ROM: Full    Dental  (+) Missing   Pulmonary asthma , sleep apnea and Continuous Positive Airway Pressure Ventilation , COPD (on 2.5L home O2 at night),  oxygen dependent, former smoker (quit 1995)   Pulmonary exam normal breath sounds clear to auscultation       Cardiovascular hypertension, + Peripheral Vascular Disease and +CHF  Normal cardiovascular exam Rhythm:Regular Rate:Normal     Neuro/Psych  Headaches PSYCHIATRIC DISORDERS  Depression    Vertigo   Neuromuscular disease (neuropathy)    GI/Hepatic ,GERD  ,,  Endo/Other  diabetes, Type 2Hypothyroidism  Class 3 obesity  Renal/GU negative Renal ROS     Musculoskeletal   Abdominal   Peds  Hematology  (+) Blood dyscrasia, anemia   Anesthesia Other Findings   Reproductive/Obstetrics                             Anesthesia Physical Anesthesia Plan  ASA: 3  Anesthesia Plan: MAC   Post-op Pain Management:    Induction: Intravenous  PONV Risk Score and Plan: 3 and Treatment may vary due to age or medical condition, Midazolam and TIVA  Airway Management Planned: Natural Airway and Nasal Cannula  Additional Equipment:   Intra-op Plan:   Post-operative Plan:   Informed Consent: I have reviewed the patients History and Physical, chart, labs and discussed the procedure including the risks, benefits and alternatives for the proposed anesthesia with the patient or authorized representative who has indicated his/her understanding and acceptance.     Dental advisory given  Plan Discussed with: CRNA  Anesthesia Plan Comments: (LMA/GETA  backup discussed.  Patient consented for risks of anesthesia including but not limited to:  - adverse reactions to medications - damage to eyes, teeth, lips or other oral mucosa - nerve damage due to positioning  - sore throat or hoarseness - damage to heart, brain, nerves, lungs, other parts of body or loss of life  Informed patient about role of CRNA in peri- and intra-operative care.  Patient voiced understanding.)       Anesthesia Quick Evaluation

## 2022-04-11 ENCOUNTER — Ambulatory Visit
Admission: RE | Admit: 2022-04-11 | Discharge: 2022-04-11 | Disposition: A | Discharge status: Home / Self Care | Payer: 59 | Attending: Ophthalmology | Admitting: Ophthalmology

## 2022-04-11 ENCOUNTER — Ambulatory Visit: Payer: Medicare Other

## 2022-04-11 ENCOUNTER — Encounter
Admission: RE | Disposition: A | Discharge status: Home / Self Care | Payer: Self-pay | Source: Home / Self Care | Attending: Ophthalmology

## 2022-04-11 ENCOUNTER — Ambulatory Visit: Payer: 59 | Admitting: Anesthesiology

## 2022-04-11 ENCOUNTER — Other Ambulatory Visit: Payer: Self-pay

## 2022-04-11 DIAGNOSIS — J4489 Other specified chronic obstructive pulmonary disease: Secondary | ICD-10-CM | POA: Insufficient documentation

## 2022-04-11 DIAGNOSIS — Z9981 Dependence on supplemental oxygen: Secondary | ICD-10-CM | POA: Insufficient documentation

## 2022-04-11 DIAGNOSIS — Z87891 Personal history of nicotine dependence: Secondary | ICD-10-CM | POA: Diagnosis not present

## 2022-04-11 DIAGNOSIS — G4730 Sleep apnea, unspecified: Secondary | ICD-10-CM | POA: Diagnosis not present

## 2022-04-11 DIAGNOSIS — E119 Type 2 diabetes mellitus without complications: Secondary | ICD-10-CM

## 2022-04-11 DIAGNOSIS — E1151 Type 2 diabetes mellitus with diabetic peripheral angiopathy without gangrene: Secondary | ICD-10-CM | POA: Diagnosis not present

## 2022-04-11 DIAGNOSIS — Z7984 Long term (current) use of oral hypoglycemic drugs: Secondary | ICD-10-CM | POA: Insufficient documentation

## 2022-04-11 DIAGNOSIS — I509 Heart failure, unspecified: Secondary | ICD-10-CM | POA: Diagnosis not present

## 2022-04-11 DIAGNOSIS — Z7985 Long-term (current) use of injectable non-insulin antidiabetic drugs: Secondary | ICD-10-CM | POA: Diagnosis not present

## 2022-04-11 DIAGNOSIS — E1142 Type 2 diabetes mellitus with diabetic polyneuropathy: Secondary | ICD-10-CM | POA: Diagnosis not present

## 2022-04-11 DIAGNOSIS — K219 Gastro-esophageal reflux disease without esophagitis: Secondary | ICD-10-CM | POA: Insufficient documentation

## 2022-04-11 DIAGNOSIS — H2512 Age-related nuclear cataract, left eye: Secondary | ICD-10-CM | POA: Insufficient documentation

## 2022-04-11 DIAGNOSIS — Z6841 Body Mass Index (BMI) 40.0 and over, adult: Secondary | ICD-10-CM | POA: Diagnosis not present

## 2022-04-11 DIAGNOSIS — Z8249 Family history of ischemic heart disease and other diseases of the circulatory system: Secondary | ICD-10-CM | POA: Insufficient documentation

## 2022-04-11 DIAGNOSIS — E039 Hypothyroidism, unspecified: Secondary | ICD-10-CM | POA: Diagnosis not present

## 2022-04-11 DIAGNOSIS — E1136 Type 2 diabetes mellitus with diabetic cataract: Secondary | ICD-10-CM | POA: Diagnosis present

## 2022-04-11 DIAGNOSIS — I110 Hypertensive heart disease with heart failure: Secondary | ICD-10-CM | POA: Insufficient documentation

## 2022-04-11 HISTORY — PX: CATARACT EXTRACTION W/PHACO: SHX586

## 2022-04-11 LAB — GLUCOSE, CAPILLARY: Glucose-Capillary: 109 mg/dL — ABNORMAL HIGH (ref 70–99)

## 2022-04-11 SURGERY — PHACOEMULSIFICATION, CATARACT, WITH IOL INSERTION
Anesthesia: Monitor Anesthesia Care | Site: Eye | Laterality: Left

## 2022-04-11 MED ORDER — FENTANYL CITRATE (PF) 100 MCG/2ML IJ SOLN
INTRAMUSCULAR | Status: DC | PRN
  Administered 2022-04-11: 25 ug via INTRAVENOUS

## 2022-04-11 MED ORDER — TETRACAINE HCL 0.5 % OP SOLN
1.0000 [drp] | OPHTHALMIC | Status: DC | PRN
  Administered 2022-04-11 (×3): 1 [drp] via OPHTHALMIC

## 2022-04-11 MED ORDER — ARMC OPHTHALMIC DILATING DROPS
1.0000 | OPHTHALMIC | Status: DC | PRN
  Administered 2022-04-11 (×3): 1 via OPHTHALMIC

## 2022-04-11 MED ORDER — MIDAZOLAM HCL 2 MG/2ML IJ SOLN
INTRAMUSCULAR | Status: DC | PRN
  Administered 2022-04-11: 1 mg via INTRAVENOUS

## 2022-04-11 MED ORDER — ONDANSETRON HCL 4 MG/2ML IJ SOLN: 4.0000 mg | Freq: Once | INTRAMUSCULAR | Status: DC | PRN

## 2022-04-11 MED ORDER — ACETAMINOPHEN 160 MG/5ML PO SOLN: 325.0000 mg | ORAL | Status: DC | PRN

## 2022-04-11 MED ORDER — SIGHTPATH DOSE#1 BSS IO SOLN
INTRAOCULAR | Status: DC | PRN
  Administered 2022-04-11: 2 mL

## 2022-04-11 MED ORDER — BRIMONIDINE TARTRATE-TIMOLOL 0.2-0.5 % OP SOLN
OPHTHALMIC | Status: DC | PRN
  Administered 2022-04-11: 1 [drp] via OPHTHALMIC

## 2022-04-11 MED ORDER — SIGHTPATH DOSE#1 BSS IO SOLN
INTRAOCULAR | Status: DC | PRN
  Administered 2022-04-11: 15 mL via INTRAOCULAR

## 2022-04-11 MED ORDER — LACTATED RINGERS IV SOLN: INTRAVENOUS | Status: DC

## 2022-04-11 MED ORDER — SIGHTPATH DOSE#1 BSS IO SOLN
INTRAOCULAR | Status: DC | PRN
  Administered 2022-04-11: 77 mL via OPHTHALMIC

## 2022-04-11 MED ORDER — CEFUROXIME OPHTHALMIC INJECTION 1 MG/0.1 ML
INJECTION | OPHTHALMIC | Status: DC | PRN
  Administered 2022-04-11: .1 mL via INTRACAMERAL

## 2022-04-11 MED ORDER — ACETAMINOPHEN 325 MG PO TABS: 650.0000 mg | ORAL_TABLET | Freq: Once | ORAL | Status: DC | PRN

## 2022-04-11 MED ORDER — SIGHTPATH DOSE#1 NA CHONDROIT SULF-NA HYALURON 40-17 MG/ML IO SOLN
INTRAOCULAR | Status: DC | PRN
  Administered 2022-04-11: 1 mL via INTRAOCULAR

## 2022-04-11 SURGICAL SUPPLY — 15 items
CANNULA ANT/CHMB 27G (MISCELLANEOUS) IMPLANT
CANNULA ANT/CHMB 27GA (MISCELLANEOUS) IMPLANT
CATARACT SUITE SIGHTPATH (MISCELLANEOUS) ×1 IMPLANT
FEE CATARACT SUITE SIGHTPATH (MISCELLANEOUS) ×1 IMPLANT
GLOVE BIOGEL PI IND STRL 8 (GLOVE) ×1 IMPLANT
GLOVE SURG ENC TEXT LTX SZ8 (GLOVE) ×1 IMPLANT
LENS IOL TECNIS EYHANCE 22.5 (Intraocular Lens) IMPLANT
NDL FILTER BLUNT 18X1 1/2 (NEEDLE) ×1 IMPLANT
NEEDLE FILTER BLUNT 18X1 1/2 (NEEDLE) ×1 IMPLANT
PACK VIT ANT 23G (MISCELLANEOUS) IMPLANT
RING MALYGIN (MISCELLANEOUS) IMPLANT
SUT ETHILON 10-0 CS-B-6CS-B-6 (SUTURE)
SUTURE EHLN 10-0 CS-B-6CS-B-6 (SUTURE) IMPLANT
SYR 3ML LL SCALE MARK (SYRINGE) ×1 IMPLANT
WATER STERILE IRR 250ML POUR (IV SOLUTION) ×1 IMPLANT

## 2022-04-11 NOTE — Transfer of Care (Signed)
Immediate Anesthesia Transfer of Care Note  Patient: [name] [name]  Procedure(s) Performed: CATARACT EXTRACTION PHACO AND INTRAOCULAR LENS PLACEMENT (IOC) LEFT DIABETIC 15.05 01:18.6 (Left: Eye)  Patient Location: PACU  Anesthesia Type:MAC  Level of Consciousness: awake, alert , and oriented  Airway & Oxygen Therapy: Patient Spontanous Breathing  Post-op Assessment: Post -op Vital signs reviewed and stable  Post vital signs: Reviewed  Last Vitals:  Vitals Value Taken Time  BP 117/54 04/11/22 0815  Temp 36.1 C 04/11/22 0815  Pulse 80 04/11/22 0815  Resp 20 04/11/22 0815  SpO2 94 % 04/11/22 0815    Last Pain:  Vitals:   04/11/22 0815  TempSrc:   PainSc: 0-No pain         Complications: No notable events documented.

## 2022-04-11 NOTE — Op Note (Signed)
PREOPERATIVE DIAGNOSIS:  Nuclear sclerotic cataract of the left eye.   POSTOPERATIVE DIAGNOSIS:  Nuclear sclerotic cataract of the left eye.   OPERATIVE PROCEDURE:ORPROCALL@   SURGEON:  Birder Robson, MD.   ANESTHESIA:  Anesthesiologist: Darrin Nipper, MD CRNA: Gigi Gin, CRNA  1.      Managed anesthesia care. 2.     0.44m of Shugarcaine was instilled following the paracentesis   COMPLICATIONS:  None.   TECHNIQUE:   Stop and chop   DESCRIPTION OF PROCEDURE:  The patient was examined and consented in the preoperative holding area where the aforementioned topical anesthesia was applied to the left eye and then brought back to the Operating Room where the left eye was prepped and draped in the usual sterile ophthalmic fashion and a lid speculum was placed. A paracentesis was created with the side port blade and the anterior chamber was filled with viscoelastic. A near clear corneal incision was performed with the steel keratome. A continuous curvilinear capsulorrhexis was performed with a cystotome followed by the capsulorrhexis forceps. Hydrodissection and hydrodelineation were carried out with BSS on a blunt cannula. The lens was removed in a stop and chop  technique and the remaining cortical material was removed with the irrigation-aspiration handpiece. The capsular bag was inflated with viscoelastic and the Technis [id number] lens was placed in the capsular bag without complication. The remaining viscoelastic was removed from the eye with the irrigation-aspiration handpiece. The wounds were hydrated. The anterior chamber was flushed with BSS and the eye was inflated to physiologic pressure. 0.130mVigamox was placed in the anterior chamber. The wounds were found to be water tight. The eye was dressed with Combigan. The patient was given protective glasses to wear throughout the day and a shield with which to sleep tonight. The patient was also given drops with which to begin a drop regimen  today and will follow-up with me in one day. Implant Name Type Inv. Item Serial No. Manufacturer Lot No. LRB No. Used Action  LENS IOL TECNIS EYHANCE 22.5 - [id number] Lens LENS IOL TECNIS EYHANCE 22.5 [id number]  Left 1 Implanted    Procedure(s) with comments: CATARACT EXTRACTION PHACO AND INTRAOCULAR LENS PLACEMENT (IOC) LEFT DIABETIC 15.05 01:18.6 (Left) - Diabetic  Sleep apnea  Electronically signed: WiBirder Robson/23/2024 8:14 AM

## 2022-04-11 NOTE — Anesthesia Postprocedure Evaluation (Signed)
Anesthesia Post Note  Patient: [name] [name]  Procedure(s) Performed: CATARACT EXTRACTION PHACO AND INTRAOCULAR LENS PLACEMENT (IOC) LEFT DIABETIC 15.05 01:18.6 (Left: Eye)  Patient location during evaluation: PACU Anesthesia Type: MAC Level of consciousness: awake and alert, oriented and patient cooperative Pain management: pain level controlled Vital Signs Assessment: post-procedure vital signs reviewed and stable Respiratory status: spontaneous breathing, nonlabored ventilation and respiratory function stable Cardiovascular status: blood pressure returned to baseline and stable Postop Assessment: adequate PO intake Anesthetic complications: no   No notable events documented.   Last Vitals:  Vitals:   04/11/22 0815 04/11/22 0821  BP: (!) 117/54 119/61  Pulse: 80 81  Resp: 20 20  Temp: (!) 36.1 C (!) 36.1 C  SpO2: 94% 94%    Last Pain:  Vitals:   04/11/22 0821  TempSrc:   PainSc: 0-No pain                 Darrin Nipper

## 2022-04-11 NOTE — H&P (Signed)
[facility]   Primary Care Physician:  Denton Lank, MD Ophthalmologist: Dr. George Ina  Pre-Procedure History & Physical: HPI:  [name] [name] is a 67 y.o. female here for cataract surgery.   Past Medical History:  Diagnosis Date   Allergic rhinitis    Allergy    Asthma    Back pain    CHF (congestive heart failure) (HCC)    COPD (chronic obstructive pulmonary disease) (HCC)    Degenerative joint disease of knee, left    Depression    Diabetes mellitus without complication (HCC)    Edema    Elevated lipids    GERD (gastroesophageal reflux disease)    Hidradenitis    History of colonic polyps    Hypertension    Hypothyroidism    IBS (irritable bowel syndrome)    Insomnia    Neuropathy    feet and legs   Obesity    Oxygen dependent    with CPAP at night   Pneumonia    PONV (postoperative nausea and vomiting)    PVD (peripheral vascular disease) (HCC)    Sinusitis, chronic    Sleep apnea    CPAP   Thyroid disease    Vaginitis, atrophic    Vertigo    Vitamin D deficiency     Past Surgical History:  Procedure Laterality Date   ABDOMINAL HYSTERECTOMY     AXILLARY HIDRADENITIS EXCISION     BREAST BIOPSY Right 02/22/2022   stereo bx, calcs, "X" clip- ATYPICAL INTRADUCTAL PAPILLARY PROLIFERATION WITH SCLEROSIS AND CALCIFICATION PROLIFERATION WITH SCLEROSIS AND CALCIFICATION   BREAST BIOPSY Right 02/22/2022   MM RT BREAST BX W LOC DEV 1ST LESION IMAGE BX SPEC STEREO GUIDE 02/22/2022 [facility]   BREAST BIOPSY Right 04/04/2022   rt br calcs coil clip path pending   BREAST BIOPSY Right 04/04/2022   MM RT BREAST BX W LOC DEV 1ST LESION IMAGE BX SPEC STEREO GUIDE 04/04/2022 [facility]   BREAST EXCISIONAL BIOPSY Bilateral 67/8938   RUPTURED FOLLICULAR CYSTS WITH ABSCESSES AND SCARRING, CONSISTENT WITH HIDRADENITIS SUPPURATIVA.    CATARACT EXTRACTION W/PHACO Right 07/27/2020   Procedure: CATARACT EXTRACTION PHACO AND INTRAOCULAR LENS PLACEMENT (IOC) RIGHT  DIABETIC 17.13 01:23.9;  Surgeon: Birder Robson, MD;  Location: [place];  Service: Ophthalmology;  Laterality: Right;  Diabetic - insulin  sleep apnea wants to be last   CESAREAN SECTION     x 2   CHOLECYSTECTOMY     COLONOSCOPY WITH PROPOFOL N/A 09/15/2021   Procedure: COLONOSCOPY WITH PROPOFOL;  Surgeon: Lin Landsman, MD;  Location: [facility];  Service: Gastroenterology;  Laterality: N/A;   HEEL SPUR EXCISION N/A    HYDRADENITIS EXCISION Right 12/31/2015   Procedure: EXCISION HIDRADENITIS AXILLA;  Surgeon: Clayburn Pert, MD;  Location: [facility];  Service: General;  Laterality: Right;   KNEE SURGERY Left    1998   TONSILLECTOMY      Prior to Admission medications   Medication Sig Start Date End Date Taking? Authorizing Provider  acetaminophen (TYLENOL) 500 MG tablet Take 500 mg by mouth every 6 (six) hours as needed.   Yes [provider]  albuterol (PROVENTIL HFA;VENTOLIN HFA) 108 (90 Base) MCG/ACT inhaler Inhale 2 puffs into the lungs every 6 (six) hours as needed for wheezing or shortness of breath.   Yes [provider]  aspirin EC 81 MG tablet Take 81 mg by mouth daily.   Yes [provider]  atorvastatin (LIPITOR) 20 MG tablet Take 20 mg by mouth  at bedtime.    Yes [provider]  budesonide-formoterol (SYMBICORT) 160-4.5 MCG/ACT inhaler Inhale 2 puffs into the lungs 2 (two) times daily.   Yes [provider]  cetirizine (ZYRTEC) 10 MG tablet Take 10 mg by mouth daily.   Yes [provider]  clindamycin (CLINDAGEL) 1 % gel Apply to aa's under arms QD. 01/12/22  Yes Moye, [place], MD  clobetasol (OLUX) 0.05 % topical foam Apply to aa's psoriasis on the scalp and body BID up to two weeks PRN. Avoid applying to face, groin, and axilla. Use as directed. Long-term use can cause thinning of the skin. 01/12/22  Yes Moye, [place], MD  cyclobenzaprine (FLEXERIL) 10 MG tablet Take 10 mg by mouth at bedtime.    Yes [provider]  doxepin (SINEQUAN) 50 MG capsule Take 50 mg by mouth at bedtime.   Yes [provider]  DULoxetine (CYMBALTA) 20 MG capsule Take 40 mg by mouth daily.   Yes [provider]  empagliflozin (JARDIANCE) 10 MG TABS tablet Take 25 mg by mouth daily.   Yes [provider]  fluticasone Asencion Islam) 50 MCG/ACT nasal spray  06/24/18  Yes [provider]  gabapentin (NEURONTIN) 300 MG capsule Take 600 mg by mouth 2 (two) times daily. 03/25/17  Yes [provider]  glucose blood test strip TEST three times a day 12/30/15  Yes [provider]  ibuprofen (ADVIL,MOTRIN) 800 MG tablet Take 1 tablet (800 mg total) by mouth every 8 (eight) hours as needed for mild pain or moderate pain (with food). 12/21/16  Yes Eula Listen, MD  ipratropium (ATROVENT HFA) 17 MCG/ACT inhaler Inhale 2 puffs into the lungs every 6 (six) hours as needed for wheezing. Patient uses once a day   Yes [provider]  ipratropium-albuterol (DUONEB) 0.5-2.5 (3) MG/3ML SOLN Take 3 mLs by nebulization every 6 (six) hours as needed. 01/25/22  Yes Kasa, Maretta Bees, MD  ketoconazole (NIZORAL) 2 % shampoo Shampoo into scalp let sit 10 minutes then wash out. Use 3d/wk. 01/12/22  Yes Moye, [place], MD  Lancets Misc. (ACCU-CHEK FASTCLIX LANCET) KIT  12/27/15  Yes [provider]  levocetirizine (XYZAL) 5 MG tablet TAKE 1 TABLET BY MOUTH EVERY EVENING 12/13/21  Yes Moye, [place], MD  levothyroxine (SYNTHROID, LEVOTHROID) 200 MCG tablet Take 175 mcg by mouth daily before breakfast.   Yes [provider]  Melatonin 5 MG CAPS Take 2 capsules by mouth daily.   Yes [provider]  mupirocin ointment (BACTROBAN) 2 % APPLY TOPICALLY EVERY DAY WITH DRESSING CHANGES 10/10/21  Yes Moye, [place], MD  olopatadine (PATANOL) 0.1 % ophthalmic solution Place 1 drop into both eyes 2 (two) times daily.   Yes [provider]  omeprazole  (PRILOSEC) 40 MG capsule Take 40 mg by mouth daily.   Yes [provider]  OXYGEN Inhale into the lungs. With CPAP   Yes [provider]  potassium citrate (UROCIT-K) 10 MEQ (1080 MG) SR tablet TAKE ONE TABLET BY MOUTH EVERY MORNING 02/06/22  Yes Hackney, Tina A, FNP  roflumilast (DALIRESP) 500 MCG TABS tablet Take 500 mcg by mouth daily.   Yes [provider]  spironolactone (ALDACTONE) 25 MG tablet Take 1 tablet (25 mg total) by mouth daily. 01/13/22  Yes Agbor-Etang, Aaron Edelman, MD  tirzepatide [facility]) 5 MG/0.5ML Pen Inject 5 mg into the skin once a week. Saturday   Yes [provider]  torsemide (DEMADEX) 20 MG tablet TAKE ONE TABLET BY MOUTH DAILY 12/12/21  Yes Darylene Price A, FNP  traZODone (DESYREL) 50 MG tablet Take 50-100 mg by mouth at bedtime as needed for sleep.    Yes [provider]  meclizine (ANTIVERT) 25 MG tablet Take 25 mg by mouth 3 (three) times daily as needed for dizziness. Patient not taking: Reported on 04/06/2022    [provider]  Semaglutide,0.25 or 0.'5MG'$ /DOS, (OZEMPIC, 0.25 OR 0.5 MG/DOSE,) 2 MG/1.5ML SOPN Inject into the skin once a week. Patient not taking: Reported on 04/06/2022    [provider]    Allergies as of 03/06/2022 - Review Complete 03/01/2022  Allergen Reaction Noted   Codeine Itching 09/15/2014   Levofloxacin Rash 07/27/2020    Family History  Problem Relation Age of Onset   Rashes / Skin problems Father    Hypertension Father    Heart disease Father    Breast cancer Paternal Grandmother    COPD Mother    Kidney cancer Neg Hx    Bladder Cancer Neg Hx     Social History   Socioeconomic History   Marital status: Divorced    Spouse name: Not on file   Number of children: Not on file   Years of education: Not on file   Highest education level: Not on file  Occupational History   Occupation: disabled  Tobacco Use   Smoking status: Former    Packs/day: 3.00    Years: 20.00     Total pack years: 60.00    Types: Cigarettes    Quit date: 12/18/1993    Years since quitting: 28.3   Smokeless tobacco: Never  Vaping Use   Vaping Use: Never used  Substance and Sexual Activity   Alcohol use: No   Drug use: No   Sexual activity: Never    Birth control/protection: Abstinence  Other Topics Concern   Not on file  Social History Narrative   Not on file   Social Determinants of Health   Financial Resource Strain: Low Risk  (03/09/2017)   Overall Financial Resource Strain (CARDIA)    Difficulty of Paying Living Expenses: Not hard at all  Food Insecurity: Unknown (03/09/2017)   Hunger Vital Sign    Worried About Running Out of Food in the Last Year: Patient refused    Ran Out of Food in the Last Year: Patient refused  Transportation Needs: Unknown (03/09/2017)   [place] - Transportation    Lack of Transportation (Medical): Patient refused    Lack of Transportation (Non-Medical): Patient refused  Physical Activity: Unknown (03/09/2017)   Exercise Vital Sign    Days of Exercise per Week: Patient refused    Minutes of Exercise per Session: Patient refused  Stress: No Stress Concern Present (03/09/2017)   [place] of Stress : Not at all  Social Connections: Somewhat Isolated (03/09/2017)   Social Connection and Isolation Panel [NHANES]    Frequency of Communication with Friends and Family: More than three times a week    Frequency of Social Gatherings with Friends and Family: Once a week    Attends Religious Services: More than 4 times per year    Active Member of [employer] or Organizations: No    Attends Archivist Meetings: Never    Marital Status: Divorced  Human resources officer Violence: Unknown (03/09/2017)   Humiliation, Afraid, Rape, and Kick questionnaire    Fear of Current or Ex-Partner: Patient refused    Emotionally Abused: Patient refused  Physically Abused: Patient  refused    Sexually Abused: Patient refused    Review of Systems: See HPI, otherwise negative ROS  Physical Exam: BP 125/68   Pulse 83   Temp 97.7 F (36.5 C) (Temporal)   Resp 20   Ht '5\' 3"'$  (1.6 m)   Wt 114.8 kg   SpO2 95%   BMI 44.82 kg/m  General:   Alert, cooperative in NAD Head:  Normocephalic and atraumatic. Respiratory:  Normal work of breathing. Cardiovascular:  RRR  Impression/Plan: [name] [name] is here for cataract surgery.  Risks, benefits, limitations, and alternatives regarding cataract surgery have been reviewed with the patient.  Questions have been answered.  All parties agreeable.   Birder Robson, MD  04/11/2022, 7:46 AM

## 2022-04-12 ENCOUNTER — Ambulatory Visit: Payer: Medicare Other

## 2022-04-13 ENCOUNTER — Encounter: Payer: Self-pay | Admitting: Ophthalmology

## 2022-04-13 ENCOUNTER — Ambulatory Visit: Payer: Medicare Other

## 2022-04-17 ENCOUNTER — Ambulatory Visit: Payer: Medicare Other

## 2022-04-17 ENCOUNTER — Ambulatory Visit: Payer: Medicare Other | Admitting: Cardiology

## 2022-04-18 ENCOUNTER — Ambulatory Visit: Payer: 59

## 2022-04-19 ENCOUNTER — Telehealth: Payer: Self-pay

## 2022-04-19 ENCOUNTER — Encounter: Payer: Self-pay | Admitting: Surgery

## 2022-04-19 ENCOUNTER — Ambulatory Visit (INDEPENDENT_AMBULATORY_CARE_PROVIDER_SITE_OTHER): Payer: 59 | Admitting: Surgery

## 2022-04-19 ENCOUNTER — Ambulatory Visit: Payer: Medicare Other

## 2022-04-19 VITALS — BP 131/71 | HR 92 | Temp 97.9°F | Ht 63.0 in | Wt 254.4 lb

## 2022-04-19 DIAGNOSIS — D241 Benign neoplasm of right breast: Secondary | ICD-10-CM

## 2022-04-19 DIAGNOSIS — N6091 Unspecified benign mammary dysplasia of right breast: Secondary | ICD-10-CM | POA: Diagnosis not present

## 2022-04-19 NOTE — Telephone Encounter (Signed)
Faxed medical clearance to Dr. Denton Lank at [phone].

## 2022-04-19 NOTE — H&P (View-Only) (Signed)
04/19/2022  Reason for Visit:  Right breast ADH and intraductal papilloma  Requesting Provider:  Denton Lank, MD  History of Present Illness: [name] [name] is a 67 y.o. 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  The patient had a screening mammogram on 12/16/21 which showed suspicious calcifications in the right breast.  This was confirmed with a diagnostic mammogram, showing calcifications in the right upper outer quadrant.  One area which was more worrisome was biopsied on 02/22/22 which showed atypical intraductal papillary proliferation which could represent an intraductal papilloma with atypical ductal hyperplasia.  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  Recommendation has been made to excise the first site.    The patient has significant medical history for CHF, HTN, DM, COPD, OSA on CPAP with oxygen, GERD, and neuropathy.  She's followed by her PCP as well as cardiology and pulmonology.  She has a history of hidradenitis and has had an area excised in the right axilla in 2017.  She has gone through menopause and has had a hysterectomy 20 years ago.  There is history of breast cancer in her grandmother.  Did not feel any lump or skin changes.  Past Medical History: Past Medical History:  Diagnosis Date   Allergic rhinitis    Allergy    Asthma    Back pain    CHF (congestive heart failure) (HCC)    COPD (chronic obstructive pulmonary disease) (HCC)    Degenerative joint disease of knee, left    Depression    Diabetes mellitus without complication (HCC)    Edema    Elevated lipids    GERD (gastroesophageal reflux disease)    Hidradenitis    History of colonic polyps    Hypertension    Hypothyroidism    IBS (irritable bowel syndrome)    Insomnia    Neuropathy    feet and legs   Obesity    Oxygen dependent    with CPAP  at night   Pneumonia    PONV (postoperative nausea and vomiting)    PVD (peripheral vascular disease) (HCC)    Sinusitis, chronic    Sleep apnea    CPAP   Thyroid disease    Vaginitis, atrophic    Vertigo    Vitamin D deficiency      Past Surgical History: Past Surgical History:  Procedure Laterality Date   ABDOMINAL HYSTERECTOMY     AXILLARY HIDRADENITIS EXCISION     BREAST BIOPSY Right 02/22/2022   stereo bx, calcs, "X" clip- ATYPICAL INTRADUCTAL PAPILLARY PROLIFERATION WITH SCLEROSIS AND CALCIFICATION PROLIFERATION WITH SCLEROSIS AND CALCIFICATION   BREAST BIOPSY Right 02/22/2022   MM RT BREAST BX W LOC DEV 1ST LESION IMAGE BX SPEC STEREO GUIDE 02/22/2022 [facility]   BREAST BIOPSY Right 04/04/2022   rt br calcs coil clip path pending   BREAST BIOPSY Right 04/04/2022   MM RT BREAST BX W LOC DEV 1ST LESION IMAGE BX SPEC STEREO GUIDE 04/04/2022 [facility]   BREAST EXCISIONAL BIOPSY Bilateral [id number]   RUPTURED FOLLICULAR CYSTS WITH ABSCESSES AND SCARRING, CONSISTENT WITH HIDRADENITIS SUPPURATIVA.    CATARACT EXTRACTION W/PHACO Right 07/27/2020   Procedure: CATARACT EXTRACTION PHACO AND INTRAOCULAR LENS PLACEMENT (IOC) RIGHT DIABETIC 17.13 01:23.9;  Surgeon: Birder Robson, MD;  Location: [place];  Service: Ophthalmology;  Laterality: Right;  Diabetic - insulin  sleep apnea wants to be last  CATARACT EXTRACTION W/PHACO Left 04/11/2022   Procedure: CATARACT EXTRACTION PHACO AND INTRAOCULAR LENS PLACEMENT (IOC) LEFT DIABETIC 15.05 01:18.6;  Surgeon: Birder Robson, MD;  Location: [place];  Service: Ophthalmology;  Laterality: Left;  Diabetic  Sleep apnea   CESAREAN SECTION     x 2   CHOLECYSTECTOMY     COLONOSCOPY WITH PROPOFOL N/A 09/15/2021   Procedure: COLONOSCOPY WITH PROPOFOL;  Surgeon: Lin Landsman, MD;  Location: [facility];  Service: Gastroenterology;  Laterality: N/A;   HEEL SPUR EXCISION N/A    HYDRADENITIS EXCISION  Right 12/31/2015   Procedure: EXCISION HIDRADENITIS AXILLA;  Surgeon: Clayburn Pert, MD;  Location: [facility];  Service: General;  Laterality: Right;   KNEE SURGERY Left    1998   TONSILLECTOMY      Home Medications: Prior to Admission medications   Medication Sig Start Date End Date Taking? Authorizing Provider  acetaminophen (TYLENOL) 500 MG tablet Take 500 mg by mouth every 6 (six) hours as needed.   Yes [provider]  albuterol (PROVENTIL HFA;VENTOLIN HFA) 108 (90 Base) MCG/ACT inhaler Inhale 2 puffs into the lungs every 6 (six) hours as needed for wheezing or shortness of breath.   Yes [provider]  aspirin EC 81 MG tablet Take 81 mg by mouth daily.   Yes [provider]  atorvastatin (LIPITOR) 20 MG tablet Take 20 mg by mouth at bedtime.    Yes [provider]  budesonide-formoterol (SYMBICORT) 160-4.5 MCG/ACT inhaler Inhale 2 puffs into the lungs 2 (two) times daily.   Yes [provider]  clindamycin (CLINDAGEL) 1 % gel Apply to aa's under arms QD. 01/12/22  Yes Moye, [place], MD  doxepin (SINEQUAN) 50 MG capsule Take 50 mg by mouth at bedtime.   Yes [provider]  DULoxetine (CYMBALTA) 20 MG capsule Take 40 mg by mouth daily.   Yes [provider]  empagliflozin (JARDIANCE) 10 MG TABS tablet Take 25 mg by mouth daily.   Yes [provider]  fluticasone Asencion Islam) 50 MCG/ACT nasal spray  06/24/18  Yes [provider]  gabapentin (NEURONTIN) 300 MG capsule Take 600 mg by mouth 2 (two) times daily. 03/25/17  Yes [provider]  glucose blood test strip TEST three times a day 12/30/15  Yes [provider]  ibuprofen (ADVIL,MOTRIN) 800 MG tablet Take 1 tablet (800 mg total) by mouth every 8 (eight) hours as needed for mild pain or moderate pain (with food). 12/21/16  Yes Eula Listen, MD  ipratropium (ATROVENT HFA) 17 MCG/ACT inhaler Inhale 2 puffs into the lungs every 6 (six)  hours as needed for wheezing. Patient uses once a day   Yes [provider]  ipratropium-albuterol (DUONEB) 0.5-2.5 (3) MG/3ML SOLN Take 3 mLs by nebulization every 6 (six) hours as needed. 01/25/22  Yes Kasa, Maretta Bees, MD  ketoconazole (NIZORAL) 2 % shampoo Shampoo into scalp let sit 10 minutes then wash out. Use 3d/wk. 01/12/22  Yes Moye, [place], MD  Lancets Misc. (ACCU-CHEK FASTCLIX LANCET) KIT  12/27/15  Yes [provider]  levocetirizine (XYZAL) 5 MG tablet TAKE 1 TABLET BY MOUTH EVERY EVENING 12/13/21  Yes Moye, [place], MD  levothyroxine (SYNTHROID, LEVOTHROID) 200 MCG tablet Take 175 mcg by mouth daily before breakfast.   Yes [provider]  Melatonin 5 MG CAPS Take 2 capsules by mouth daily.   Yes [provider]  mupirocin ointment (BACTROBAN) 2 % APPLY TOPICALLY EVERY DAY WITH DRESSING CHANGES 10/10/21  Yes [place], [place],  MD  olopatadine (PATANOL) 0.1 % ophthalmic solution Place 1 drop into both eyes 2 (two) times daily.   Yes [provider]  omeprazole (PRILOSEC) 40 MG capsule Take 40 mg by mouth daily.   Yes [provider]  OXYGEN Inhale into the lungs. With CPAP   Yes [provider]  potassium citrate (UROCIT-K) 10 MEQ (1080 MG) SR tablet TAKE ONE TABLET BY MOUTH EVERY MORNING 02/06/22  Yes Hackney, Tina A, FNP  roflumilast (DALIRESP) 500 MCG TABS tablet Take 500 mcg by mouth daily.   Yes [provider]  tirzepatide Darcel Bayley) 5 MG/0.5ML Pen Inject 5 mg into the skin once a week. Saturday   Yes [provider]  torsemide (DEMADEX) 20 MG tablet TAKE ONE TABLET BY MOUTH DAILY 12/12/21  Yes Darylene Price A, FNP  traZODone (DESYREL) 50 MG tablet Take 50-100 mg by mouth at bedtime as needed for sleep.    Yes [provider]    Allergies: Allergies  Allergen Reactions   Codeine Itching   Levofloxacin Rash    "welts" & itching    Social History:  reports that she quit smoking about 28 years  ago. Her smoking use included cigarettes. She has a 60.00 pack-year smoking history. She has never used smokeless tobacco. She reports that she does not drink alcohol and does not use drugs.   Family History: Family History  Problem Relation Age of Onset   Rashes / Skin problems Father    Hypertension Father    Heart disease Father    Breast cancer Paternal Grandmother    COPD Mother    Kidney cancer Neg Hx    Bladder Cancer Neg Hx     Review of Systems: Review of Systems  Constitutional:  Negative for chills and fever.  HENT:  Negative for hearing loss.   Respiratory:  Negative for shortness of breath.   Cardiovascular:  Negative for chest pain.  Gastrointestinal:  Negative for nausea and vomiting.  Genitourinary:  Negative for dysuria.  Musculoskeletal:  Negative for myalgias.  Skin:  Negative for rash.  Neurological:  Negative for dizziness.  Psychiatric/Behavioral:  Negative for depression.     Physical Exam BP 131/71   Pulse 92   Temp 97.9 F (36.6 C) (Oral)   Ht 5' 3"$  (1.6 m)   Wt 254 lb 6.4 oz (115.4 kg)   SpO2 96%   BMI 45.06 kg/m  CONSTITUTIONAL: No acute distress. HEENT:  Normocephalic, atraumatic, extraocular motion intact. NECK: Trachea is midline, and there is no jugular venous distension.  RESPIRATORY:  Lungs are clear, and breath sounds are equal bilaterally. Normal respiratory effort without pathologic use of accessory muscles. CARDIOVASCULAR: Heart is regular without murmurs, gallops, or rubs. BREAST:  Right breast s/p two biopsies via lateral approach, with biopsy skin sites healing well.  No evidence of hematoma or infection.  No palpable masses, skin changes, or nipple changes.  No right axillary lymphadenopathy.  Right axilla scar from hidradenitis is well healed.  Left breast without any palpable masses, skin changes except for another hidradenitis scar inferiorly, or nipple changes.  No left axillary lymphadenopathy. MUSCULOSKELETAL:  Normal muscle  strength and tone in all four extremities.  Patient does have some edema of lower extremities. SKIN: Skin turgor is normal. There are no pathologic skin lesions.  NEUROLOGIC:  Motor and sensation is grossly normal.  Cranial nerves are grossly intact. PSYCH:  Alert and oriented to person, place and time. Affect is normal.  Laboratory Analysis: Right  breast biopsy on 02/22/22: DIAGNOSIS:  A. BREAST CALCIFICATIONS, RIGHT UPPER OUTER QUADRANT; STEREOTACTIC BIOPSY:  - ATYPICAL INTRADUCTAL PAPILLARY PROLIFERATION WITH SCLEROSIS AND CALCIFICATION.  - SEE COMMENT   Comment:  The differential diagnosis for the above findings includes a focally  sclerotic intraductal papilloma with at least atypical ductal hyperplasia (ADH).   Right breast biopsy on 04/04/22: DIAGNOSIS:  A. BREAST, RIGHT LATERAL; STEREOTACTIC CORE NEEDLE BIOPSY:  - BENIGN MAMMARY PARENCHYMA DISPLAYING FIBROCYSTIC CHANGES, COLUMNAR CELL CHANGE WITHOUT ATYPIA, AND PSEUDOANGIOMATOUS STROMAL HYPERPLASIA, WITH ASSOCIATED COARSE DYSTROPHIC CALCIFICATIONS.  - NEGATIVE FOR ATYPICAL PROLIFERATIVE BREAST DISEASE.   Imaging: Right breast mammogram on 12/28/21: FINDINGS: Spot magnification views of the RIGHT breast demonstrate 2 adjacent groups of similar appearing amorphous calcifications in the RIGHT upper outer breast at middle to posterior depth. Dominant group spans approximately 16 mm; it is better appreciated on the true lateral. These are not definitively stable in comparison to prior mammograms. There is a smaller group best delineated on true lateral inferior from the dominant group which spans approximately 5 mm. In total, this area spans approximately 2.4 cm.   IMPRESSION: There is a 16 mm group of calcifications in the RIGHT upper outer breast at middle to posterior depth which are indeterminate. Recommend stereotactic guided biopsy for definitive characterization.   RECOMMENDATION: RIGHT breast stereotactic guided biopsy  x1   I have discussed the findings and recommendations with the patient. The biopsy procedure was discussed with the patient and questions were answered. Patient expressed their understanding of the biopsy recommendation. Patient will be scheduled for biopsy at her earliest convenience by the schedulers. Ordering provider will be notified. If applicable, a reminder letter will be sent to the patient regarding the next appointment.   BI-RADS CATEGORY  4: Suspicious.   Assessment and Plan: This is a 67 y.o. female with right breast ADH and IDP.  --Discussed with the patient the findings on her mammogram and the two biopsies that she's had.  The areas are relatively close together, and although the 2nd biopsy did not show any atypical changes, I discussed with the patient the option for resecting both areas together in one specimen.  She is in agreement with this and reports would have more peace of mind.   --Discussed with her the process going forwards, and that she would need insertion of RF tags in order to localize both areas more accurately.  This would be done preoperatively on a different date at [facility] and then we would do the lumpectomy at [facility] in the OR.  Discussed the plan for RF tag localized lumpectomy of two areas, and reviewed the surgery at length with her including the planned incision to incorporate both areas together, the risks of bleeding, infection, injury to surrounding structures, the possibility of needing more excision for margins, that this would be an outpatient surgery, post-operative use of a breast binder, pain control, and she's willing to proceed. --She is concerned about needing endotracheal intubation and reports having had very raw throat after her last surgery.  Discussed with her that I would defer to the anesthesia team on this, particularly given her history of COPD and OSA. --As a precaution, I will also send for medical, cardiology, and  pulmonary clearance for surgery.  She understands that this may result in postponement of her surgery date. --For now, will tentatively schedule her surgery for 05/09/21.  All of her questions have been answered.  I spent 55 minutes dedicated to the care of this  patient on the date of this encounter to include pre-visit review of records, face-to-face time with the patient discussing diagnosis and management, and any post-visit coordination of care.   Melvyn Neth, [facility]

## 2022-04-19 NOTE — Patient Instructions (Signed)
Our surgery scheduler Barbara will call you within 24-48 hours to get you scheduled. If you have not heard from her after 48 hours, please call our office. Have the blue sheet available when she calls to write down important information.   If you have any concerns or questions, please feel free to call our office.  Lumpectomy  A lumpectomy, sometimes called a partial mastectomy, is surgery to remove a cancerous tumor or mass (the lump) from a breast. It is a form of breast-conserving or breast-preservation surgery. This means that the cancerous tissue is removed but the breast remains intact. During a lumpectomy, the portion of the breast that contains the tumor is removed. Lymph nodes under your arm may also be removed. Lymph nodes are part of the body's disease-fighting system (immune system) and are usually the first place where breast cancer spreads. Tell a health care provider about: Any allergies you have. All medicines you are taking, including vitamins, herbs, eye drops, creams, and over-the-counter medicines. Any problems you or family members have had with anesthetic medicines. Any bleeding problems you have. Any surgeries you have had. Any medical conditions you have. Whether you are pregnant or may be pregnant. What are the risks? Generally, this is a safe procedure. However, problems may occur, including: Bleeding. Infection. Allergic reaction to medicines. Pain, swelling, weakness, or numbness in the arm on the side of your surgery. Temporary swelling. Change in the shape of the breast, especially if a large portion is removed. Scar tissue that forms at the surgical site and feels hard to the touch. Blood clots. What happens before the procedure? When to stop eating and drinking Follow instructions from your health care provider about what you may eat and drink. These may include: 8 hours before your procedure Stop eating most foods. Do not eat meat, fried foods, or fatty  foods. Eat only light foods, such as toast or crackers. All liquids are okay except energy drinks and alcohol. 6 hours before your procedure Stop eating. Drink only clear liquids, such as water, clear fruit juice, black coffee, plain tea, and sports drinks. Do not drink energy drinks or alcohol. 2 hours before your procedure Stop drinking all liquids. You may be allowed to take medicines with small sips of water. If you do not follow your health care provider's instructions, your procedure may be delayed or canceled. Medicines Ask your health care provider about: Changing or stopping your regular medicines. These include any diabetes medicines or blood thinners you take. Taking medicines such as aspirin and ibuprofen. These medicines can thin your blood. Do not take them unless your health care provider tells you to. Taking over-the-counter medicines, vitamins, herbs, and supplements. Surgery safety Ask your health care provider how your surgery site will be marked. A procedure may be done to locate and mark the tumor area in the breast (localization). This will guide the surgeon to where the incision will be made. This may be done with: Imaging, such as a mammogram, ultrasound, or MRI. Insertion of a small wire, clip, or seed, or an implant that will reflect a radar signal. Also, ask what steps will be taken to help prevent infection. These may include: Washing skin with a germ-killing soap. Taking antibiotic medicine. General instructions You may have screening tests or exams to get normal measurements of your arm, also called baseline measurements. These can be compared to measurements done after surgery to monitor for swelling (lymphedema) that can develop after having lymph nodes removed. If you   will be going home right after the procedure, plan to have a responsible adult: Take you home from the hospital or clinic. You will not be allowed to drive. Care for you for the time you are  told. What happens during the procedure?  An IV will be inserted into one of your veins. You may be given: A sedative. This helps you relax. Anesthesia. This will: Numb certain areas of your body. Make you fall asleep for surgery. An electric scalpel will be used to reduce bleeding (electrocautery knife). A curved incision that follows the natural curve of your breast will be made. The tumor will be removed along with some of the tissue around it. This will be sent to the lab for testing. Your health care provider may also remove lymph nodes at this time if needed. If the tumor is close to the muscles over your chest, some muscle tissue may also be removed. A small drain tube may be inserted into your breast area or armpit to collect fluid that may build up after surgery. This tube will be connected to a suction bulb on the outside of your body to remove the fluid. The incision will be closed with stitches (sutures). A bandage (dressing) may be placed over the incision. The procedure may vary among health care providers and hospitals. What happens after the procedure? Your blood pressure, heart rate, breathing rate, and blood oxygen level will be monitored until you leave the hospital or clinic. You will be given medicine for pain as needed. You will be encouraged to get up and walk as soon as you can. This will improve blood flow and breathing. Ask for help if you feel weak or unsteady. You may have a drain tube in place for 2-3 days to prevent a collection of blood (hematoma) from developing in the breast. You may have a pressure bandage applied for 1-2 days to prevent bleeding or swelling. Ask your health care provider how to care for your bandage at home. You may be given a tight sleeve to wear over your arm on the side of your surgery. Wear the sleeve as told by your health care provider. Do not drive or operate machinery until your health care provider says that it is safe. Where to  find more information American Cancer Society: cancer.org National Cancer Institute: cancer.gov Summary A lumpectomy, sometimes called a partial mastectomy, is surgery to remove a cancerous tumor or mass (the lump) from a breast. During a lumpectomy, the portion of the breast that contains the tumor is removed. Plan to have someone take you home from the hospital or clinic. You may have a drain tube in place for 2-3 days to prevent a collection of blood (hematoma) from developing in the breast. This information is not intended to replace advice given to you by your health care provider. Make sure you discuss any questions you have with your health care provider. Document Revised: 05/30/2021 Document Reviewed: 05/15/2021 Elsevier Patient Education  2023 Elsevier Inc.  

## 2022-04-19 NOTE — Telephone Encounter (Signed)
Faxed cardiac clearance to Dr. Brian Agbor-Etang at [phone]. 

## 2022-04-19 NOTE — Progress Notes (Signed)
04/19/2022  Reason for Visit:  Right breast ADH and intraductal papilloma  Requesting Provider:  Sarah Patel, MD  History of Present Illness: [name] [name] is a 67 y.o. 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  The patient had a screening mammogram on 12/16/21 which showed suspicious calcifications in the right breast.  This was confirmed with a diagnostic mammogram, showing calcifications in the right upper outer quadrant.  One area which was more worrisome was biopsied on 02/22/22 which showed atypical intraductal papillary proliferation which could represent an intraductal papilloma with atypical ductal hyperplasia.  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  Recommendation has been made to excise the first site.    The patient has significant medical history for CHF, HTN, DM, COPD, OSA on CPAP with oxygen, GERD, and neuropathy.  She's followed by her PCP as well as cardiology and pulmonology.  She has a history of hidradenitis and has had an area excised in the right axilla in 2017.  She has gone through menopause and has had a hysterectomy 20 years ago.  There is history of breast cancer in her grandmother.  Did not feel any lump or skin changes.  Past Medical History: Past Medical History:  Diagnosis Date   Allergic rhinitis    Allergy    Asthma    Back pain    CHF (congestive heart failure) (HCC)    COPD (chronic obstructive pulmonary disease) (HCC)    Degenerative joint disease of knee, left    Depression    Diabetes mellitus without complication (HCC)    Edema    Elevated lipids    GERD (gastroesophageal reflux disease)    Hidradenitis    History of colonic polyps    Hypertension    Hypothyroidism    IBS (irritable bowel syndrome)    Insomnia    Neuropathy    feet and legs   Obesity    Oxygen dependent    with CPAP  at night   Pneumonia    PONV (postoperative nausea and vomiting)    PVD (peripheral vascular disease) (HCC)    Sinusitis, chronic    Sleep apnea    CPAP   Thyroid disease    Vaginitis, atrophic    Vertigo    Vitamin D deficiency      Past Surgical History: Past Surgical History:  Procedure Laterality Date   ABDOMINAL HYSTERECTOMY     AXILLARY HIDRADENITIS EXCISION     BREAST BIOPSY Right 02/22/2022   stereo bx, calcs, "X" clip- ATYPICAL INTRADUCTAL PAPILLARY PROLIFERATION WITH SCLEROSIS AND CALCIFICATION PROLIFERATION WITH SCLEROSIS AND CALCIFICATION   BREAST BIOPSY Right 02/22/2022   MM RT BREAST BX W LOC DEV 1ST LESION IMAGE BX SPEC STEREO GUIDE 02/22/2022 [facility]   BREAST BIOPSY Right 04/04/2022   rt br calcs coil clip path pending   BREAST BIOPSY Right 04/04/2022   MM RT BREAST BX W LOC DEV 1ST LESION IMAGE BX SPEC STEREO GUIDE 04/04/2022 [facility]   BREAST EXCISIONAL BIOPSY Bilateral 12/2015   RUPTURED FOLLICULAR CYSTS WITH ABSCESSES AND SCARRING, CONSISTENT WITH HIDRADENITIS SUPPURATIVA.    CATARACT EXTRACTION W/PHACO Right 07/27/2020   Procedure: CATARACT EXTRACTION PHACO AND INTRAOCULAR LENS PLACEMENT (IOC) RIGHT DIABETIC 17.13 01:23.9;  Surgeon: Porfilio, William, MD;  Location: [facility];  Service: Ophthalmology;  Laterality: Right;  Diabetic - insulin  sleep apnea wants to be last     CATARACT EXTRACTION W/PHACO Left 04/11/2022   Procedure: CATARACT EXTRACTION PHACO AND INTRAOCULAR LENS PLACEMENT (IOC) LEFT DIABETIC 15.05 01:18.6;  Surgeon: Porfilio, William, MD;  Location: [facility];  Service: Ophthalmology;  Laterality: Left;  Diabetic  Sleep apnea   CESAREAN SECTION     x 2   CHOLECYSTECTOMY     COLONOSCOPY WITH PROPOFOL N/A 09/15/2021   Procedure: COLONOSCOPY WITH PROPOFOL;  Surgeon: Vanga, Rohini Reddy, MD;  Location: [facility];  Service: Gastroenterology;  Laterality: N/A;   HEEL SPUR EXCISION N/A    HYDRADENITIS EXCISION  Right 12/31/2015   Procedure: EXCISION HIDRADENITIS AXILLA;  Surgeon: Charles Woodham, MD;  Location: [facility];  Service: General;  Laterality: Right;   KNEE SURGERY Left    1998   TONSILLECTOMY      Home Medications: Prior to Admission medications   Medication Sig Start Date End Date Taking? Authorizing Provider  acetaminophen (TYLENOL) 500 MG tablet Take 500 mg by mouth every 6 (six) hours as needed.   Yes [provider]  albuterol (PROVENTIL HFA;VENTOLIN HFA) 108 (90 Base) MCG/ACT inhaler Inhale 2 puffs into the lungs every 6 (six) hours as needed for wheezing or shortness of breath.   Yes [provider]  aspirin EC 81 MG tablet Take 81 mg by mouth daily.   Yes [provider]  atorvastatin (LIPITOR) 20 MG tablet Take 20 mg by mouth at bedtime.    Yes [provider]  budesonide-formoterol (SYMBICORT) 160-4.5 MCG/ACT inhaler Inhale 2 puffs into the lungs 2 (two) times daily.   Yes [provider]  clindamycin (CLINDAGEL) 1 % gel Apply to aa's under arms QD. 01/12/22  Yes Moye, Virginia, MD  doxepin (SINEQUAN) 50 MG capsule Take 50 mg by mouth at bedtime.   Yes [provider]  DULoxetine (CYMBALTA) 20 MG capsule Take 40 mg by mouth daily.   Yes [provider]  empagliflozin (JARDIANCE) 10 MG TABS tablet Take 25 mg by mouth daily.   Yes [provider]  fluticasone (FLONASE) 50 MCG/ACT nasal spray  06/24/18  Yes [provider]  gabapentin (NEURONTIN) 300 MG capsule Take 600 mg by mouth 2 (two) times daily. 03/25/17  Yes [provider]  glucose blood test strip TEST three times a day 12/30/15  Yes [provider]  ibuprofen (ADVIL,MOTRIN) 800 MG tablet Take 1 tablet (800 mg total) by mouth every 8 (eight) hours as needed for mild pain or moderate pain (with food). 12/21/16  Yes Norman, Anne-Caroline, MD  ipratropium (ATROVENT HFA) 17 MCG/ACT inhaler Inhale 2 puffs into the lungs every 6 (six)  hours as needed for wheezing. Patient uses once a day   Yes [provider]  ipratropium-albuterol (DUONEB) 0.5-2.5 (3) MG/3ML SOLN Take 3 mLs by nebulization every 6 (six) hours as needed. 01/25/22  Yes Kasa, Kurian, MD  ketoconazole (NIZORAL) 2 % shampoo Shampoo into scalp let sit 10 minutes then wash out. Use 3d/wk. 01/12/22  Yes Moye, Virginia, MD  Lancets Misc. (ACCU-CHEK FASTCLIX LANCET) KIT  12/27/15  Yes [provider]  levocetirizine (XYZAL) 5 MG tablet TAKE 1 TABLET BY MOUTH EVERY EVENING 12/13/21  Yes Moye, Virginia, MD  levothyroxine (SYNTHROID, LEVOTHROID) 200 MCG tablet Take 175 mcg by mouth daily before breakfast.   Yes [provider]  Melatonin 5 MG CAPS Take 2 capsules by mouth daily.   Yes [provider]  mupirocin ointment (BACTROBAN) 2 % APPLY TOPICALLY EVERY DAY WITH DRESSING CHANGES 10/10/21  Yes Moye, Virginia,   MD  olopatadine (PATANOL) 0.1 % ophthalmic solution Place 1 drop into both eyes 2 (two) times daily.   Yes [provider]  omeprazole (PRILOSEC) 40 MG capsule Take 40 mg by mouth daily.   Yes [provider]  OXYGEN Inhale into the lungs. With CPAP   Yes [provider]  potassium citrate (UROCIT-K) 10 MEQ (1080 MG) SR tablet TAKE ONE TABLET BY MOUTH EVERY MORNING 02/06/22  Yes Hackney, Tina A, FNP  roflumilast (DALIRESP) 500 MCG TABS tablet Take 500 mcg by mouth daily.   Yes [provider]  tirzepatide (MOUNJARO) 5 MG/0.5ML Pen Inject 5 mg into the skin once a week. Saturday   Yes [provider]  torsemide (DEMADEX) 20 MG tablet TAKE ONE TABLET BY MOUTH DAILY 12/12/21  Yes Hackney, Tina A, FNP  traZODone (DESYREL) 50 MG tablet Take 50-100 mg by mouth at bedtime as needed for sleep.    Yes [provider]    Allergies: Allergies  Allergen Reactions   Codeine Itching   Levofloxacin Rash    "welts" & itching    Social History:  reports that she quit smoking about 28 years  ago. Her smoking use included cigarettes. She has a 60.00 pack-year smoking history. She has never used smokeless tobacco. She reports that she does not drink alcohol and does not use drugs.   Family History: Family History  Problem Relation Age of Onset   Rashes / Skin problems Father    Hypertension Father    Heart disease Father    Breast cancer Paternal Grandmother    COPD Mother    Kidney cancer Neg Hx    Bladder Cancer Neg Hx     Review of Systems: Review of Systems  Constitutional:  Negative for chills and fever.  HENT:  Negative for hearing loss.   Respiratory:  Negative for shortness of breath.   Cardiovascular:  Negative for chest pain.  Gastrointestinal:  Negative for nausea and vomiting.  Genitourinary:  Negative for dysuria.  Musculoskeletal:  Negative for myalgias.  Skin:  Negative for rash.  Neurological:  Negative for dizziness.  Psychiatric/Behavioral:  Negative for depression.     Physical Exam BP 131/71   Pulse 92   Temp 97.9 F (36.6 C) (Oral)   Ht 5' 3" (1.6 m)   Wt 254 lb 6.4 oz (115.4 kg)   SpO2 96%   BMI 45.06 kg/m  CONSTITUTIONAL: No acute distress. HEENT:  Normocephalic, atraumatic, extraocular motion intact. NECK: Trachea is midline, and there is no jugular venous distension.  RESPIRATORY:  Lungs are clear, and breath sounds are equal bilaterally. Normal respiratory effort without pathologic use of accessory muscles. CARDIOVASCULAR: Heart is regular without murmurs, gallops, or rubs. BREAST:  Right breast s/p two biopsies via lateral approach, with biopsy skin sites healing well.  No evidence of hematoma or infection.  No palpable masses, skin changes, or nipple changes.  No right axillary lymphadenopathy.  Right axilla scar from hidradenitis is well healed.  Left breast without any palpable masses, skin changes except for another hidradenitis scar inferiorly, or nipple changes.  No left axillary lymphadenopathy. MUSCULOSKELETAL:  Normal muscle  strength and tone in all four extremities.  Patient does have some edema of lower extremities. SKIN: Skin turgor is normal. There are no pathologic skin lesions.  NEUROLOGIC:  Motor and sensation is grossly normal.  Cranial nerves are grossly intact. PSYCH:  Alert and oriented to person, place and time. Affect is normal.  Laboratory Analysis: Right   breast biopsy on 02/22/22: DIAGNOSIS:  A. BREAST CALCIFICATIONS, RIGHT UPPER OUTER QUADRANT; STEREOTACTIC BIOPSY:  - ATYPICAL INTRADUCTAL PAPILLARY PROLIFERATION WITH SCLEROSIS AND CALCIFICATION.  - SEE COMMENT   Comment:  The differential diagnosis for the above findings includes a focally  sclerotic intraductal papilloma with at least atypical ductal hyperplasia (ADH).   Right breast biopsy on 04/04/22: DIAGNOSIS:  A. BREAST, RIGHT LATERAL; STEREOTACTIC CORE NEEDLE BIOPSY:  - BENIGN MAMMARY PARENCHYMA DISPLAYING FIBROCYSTIC CHANGES, COLUMNAR CELL CHANGE WITHOUT ATYPIA, AND PSEUDOANGIOMATOUS STROMAL HYPERPLASIA, WITH ASSOCIATED COARSE DYSTROPHIC CALCIFICATIONS.  - NEGATIVE FOR ATYPICAL PROLIFERATIVE BREAST DISEASE.   Imaging: Right breast mammogram on 12/28/21: FINDINGS: Spot magnification views of the RIGHT breast demonstrate 2 adjacent groups of similar appearing amorphous calcifications in the RIGHT upper outer breast at middle to posterior depth. Dominant group spans approximately 16 mm; it is better appreciated on the true lateral. These are not definitively stable in comparison to prior mammograms. There is a smaller group best delineated on true lateral inferior from the dominant group which spans approximately 5 mm. In total, this area spans approximately 2.4 cm.   IMPRESSION: There is a 16 mm group of calcifications in the RIGHT upper outer breast at middle to posterior depth which are indeterminate. Recommend stereotactic guided biopsy for definitive characterization.   RECOMMENDATION: RIGHT breast stereotactic guided biopsy  x1   I have discussed the findings and recommendations with the patient. The biopsy procedure was discussed with the patient and questions were answered. Patient expressed their understanding of the biopsy recommendation. Patient will be scheduled for biopsy at her earliest convenience by the schedulers. Ordering provider will be notified. If applicable, a reminder letter will be sent to the patient regarding the next appointment.   BI-RADS CATEGORY  4: Suspicious.   Assessment and Plan: This is a 66 y.o. female with right breast ADH and IDP.  --Discussed with the patient the findings on her mammogram and the two biopsies that she's had.  The areas are relatively close together, and although the 2nd biopsy did not show any atypical changes, I discussed with the patient the option for resecting both areas together in one specimen.  She is in agreement with this and reports would have more peace of mind.   --Discussed with her the process going forwards, and that she would need insertion of RF tags in order to localize both areas more accurately.  This would be done preoperatively on a different date at [facility] and then we would do the lumpectomy at [facility] in the OR.  Discussed the plan for RF tag localized lumpectomy of two areas, and reviewed the surgery at length with her including the planned incision to incorporate both areas together, the risks of bleeding, infection, injury to surrounding structures, the possibility of needing more excision for margins, that this would be an outpatient surgery, post-operative use of a breast binder, pain control, and she's willing to proceed. --She is concerned about needing endotracheal intubation and reports having had very raw throat after her last surgery.  Discussed with her that I would defer to the anesthesia team on this, particularly given her history of COPD and OSA. --As a precaution, I will also send for medical, cardiology, and  pulmonary clearance for surgery.  She understands that this may result in postponement of her surgery date. --For now, will tentatively schedule her surgery for 05/09/21.  All of her questions have been answered.  I spent 55 minutes dedicated to the care of this   patient on the date of this encounter to include pre-visit review of records, face-to-face time with the patient discussing diagnosis and management, and any post-visit coordination of care.   Madelena Maturin Luis Xzayvier Fagin, MD [facility]    

## 2022-04-19 NOTE — Telephone Encounter (Signed)
Faxed pulmonary clearance to Dr. Flora Lipps at [phone].

## 2022-04-20 ENCOUNTER — Ambulatory Visit: Payer: 59

## 2022-04-20 ENCOUNTER — Other Ambulatory Visit: Payer: Self-pay | Admitting: Surgery

## 2022-04-20 ENCOUNTER — Telehealth: Payer: Self-pay | Admitting: Cardiology

## 2022-04-20 DIAGNOSIS — R928 Other abnormal and inconclusive findings on diagnostic imaging of breast: Secondary | ICD-10-CM

## 2022-04-20 NOTE — Telephone Encounter (Signed)
Patient is scheduled to see MD tomorrow, will defer final clearance to Dr. Garen Lah.

## 2022-04-20 NOTE — Telephone Encounter (Signed)
   Pre-operative Risk Assessment    Patient Name: [name] [name]  DOB: [birth year] MRN: [id number]      Request for Surgical Clearance    Procedure:  Right Breast Lumpectomy  Date of Surgery:  Clearance 05/09/22                                 Surgeon:  Dr. Olean Ree Surgeon's Group or Practice Name:  [facility] Phone number:  [phone] Fax number:  [phone]   Type of Clearance Requested:   - Medical    Type of Anesthesia:  General    Additional requests/questions:    Signed, Maxwell Caul   04/20/2022, 7:51 AM

## 2022-04-21 ENCOUNTER — Encounter: Payer: Self-pay | Admitting: Cardiology

## 2022-04-21 ENCOUNTER — Ambulatory Visit: Payer: 59 | Attending: Cardiology | Admitting: Cardiology

## 2022-04-21 VITALS — BP 110/58 | HR 85 | Ht 63.0 in | Wt 255.0 lb

## 2022-04-21 DIAGNOSIS — I10 Essential (primary) hypertension: Secondary | ICD-10-CM | POA: Diagnosis not present

## 2022-04-21 DIAGNOSIS — Z01810 Encounter for preprocedural cardiovascular examination: Secondary | ICD-10-CM | POA: Diagnosis not present

## 2022-04-21 DIAGNOSIS — I5032 Chronic diastolic (congestive) heart failure: Secondary | ICD-10-CM

## 2022-04-21 NOTE — Patient Instructions (Signed)
Medication Instructions:   Your physician recommends that you continue on your current medications as directed. Please refer to the Current Medication list given to you today.  *If you need a refill on your cardiac medications before your next appointment, please call your pharmacy*   Lab Work:  None ordered  If you have labs (blood work) drawn today and your tests are completely normal, you will receive your results only by: MyChart Message (if you have MyChart) OR A paper copy in the mail If you have any lab test that is abnormal or we need to change your treatment, we will call you to review the results.   Testing/Procedures:  None ordered    Follow-Up: At [facility], you and your health needs are our priority.  As part of our continuing mission to provide you with exceptional heart care, we have created designated Provider Care Teams.  These Care Teams include your primary Cardiologist (physician) and Advanced Practice Providers (APPs -  Physician Assistants and Nurse Practitioners) who all work together to provide you with the care you need, when you need it.  We recommend signing up for the patient portal called "MyChart".  Sign up information is provided on this After Visit Summary.  MyChart is used to connect with patients for Virtual Visits (Telemedicine).  Patients are able to view lab/test results, encounter notes, upcoming appointments, etc.  Non-urgent messages can be sent to your provider as well.   To learn more about what you can do with MyChart, go to [email or web address].com.    Your next appointment:   6 month(s)  Provider:   You may see Brian Agbor-Etang, MD or one of the following Advanced Practice Providers on your designated Care Team:   Christopher Berge, NP Ryan Dunn, PA-C Cadence Furth, PA-C Sheri Hammock, NP 

## 2022-04-21 NOTE — Progress Notes (Signed)
Cardiology Office Note:    Date:  04/21/2022   ID:  [name] [name], DOB 02/14/1956, MRN [id number]  PCP:  Denton Lank, MD  Cardiologist:  Kate Sable, MD  Electrophysiologist:  None   Referring MD: Denton Lank, MD   Chief Complaint  Patient presents with   Follow-up    3 month F/U, Pre-Op clearance BREAST LUMPECTOMY 05-09-22    History of Present Illness:    [name] [name] is a 67 y.o. female with a hx of HFpEF, hypertension, former smoker x30+ years, COPD, diabetes, , morbid obesity, OSA on CPAP who presents for preop evaluation and follow-up.  Being seen for HFpEF, tolerating torsemide as prescribed.  Edema well-controlled with torsemide.  Blood pressures were running low with systolics in the 572I, Entresto was stopped, Aldactone reduce to 25 mg daily and eventually stopped.  Right breast lump was found, lumpectomy being planned.  Tolerating current medications as prescribed, blood pressures normal.  Feels well, no concerns at this time.  Prior notes Echo 03/2021 EF 60 to 20%, grade 2 diastolic dysfunction Myoview 05/2019 no evidence for ischemia.   Past Medical History:  Diagnosis Date   Allergic rhinitis    Allergy    Asthma    Back pain    CHF (congestive heart failure) (HCC)    COPD (chronic obstructive pulmonary disease) (HCC)    Degenerative joint disease of knee, left    Depression    Diabetes mellitus without complication (HCC)    Edema    Elevated lipids    GERD (gastroesophageal reflux disease)    Hidradenitis    History of colonic polyps    Hypertension    Hypothyroidism    IBS (irritable bowel syndrome)    Insomnia    Neuropathy    feet and legs   Obesity    Oxygen dependent    with CPAP at night   Pneumonia    PONV (postoperative nausea and vomiting)    PVD (peripheral vascular disease) (HCC)    Sinusitis, chronic    Sleep apnea    CPAP   Thyroid disease    Vaginitis, atrophic    Vertigo    Vitamin D deficiency     Past Surgical  History:  Procedure Laterality Date   ABDOMINAL HYSTERECTOMY     AXILLARY HIDRADENITIS EXCISION     BREAST BIOPSY Right 02/22/2022   stereo bx, calcs, "X" clip- ATYPICAL INTRADUCTAL PAPILLARY PROLIFERATION WITH SCLEROSIS AND CALCIFICATION PROLIFERATION WITH SCLEROSIS AND CALCIFICATION   BREAST BIOPSY Right 02/22/2022   MM RT BREAST BX W LOC DEV 1ST LESION IMAGE BX SPEC STEREO GUIDE 02/22/2022 [facility]   BREAST BIOPSY Right 04/04/2022   rt br calcs coil clip path pending   BREAST BIOPSY Right 04/04/2022   MM RT BREAST BX W LOC DEV 1ST LESION IMAGE BX SPEC STEREO GUIDE 04/04/2022 [facility]   BREAST EXCISIONAL BIOPSY Bilateral 35/5974   RUPTURED FOLLICULAR CYSTS WITH ABSCESSES AND SCARRING, CONSISTENT WITH HIDRADENITIS SUPPURATIVA.    CATARACT EXTRACTION W/PHACO Right 07/27/2020   Procedure: CATARACT EXTRACTION PHACO AND INTRAOCULAR LENS PLACEMENT (IOC) RIGHT DIABETIC 17.13 01:23.9;  Surgeon: Birder Robson, MD;  Location: [place];  Service: Ophthalmology;  Laterality: Right;  Diabetic - insulin  sleep apnea wants to be last   CATARACT EXTRACTION W/PHACO Left 04/11/2022   Procedure: CATARACT EXTRACTION PHACO AND INTRAOCULAR LENS PLACEMENT (IOC) LEFT DIABETIC 15.05 01:18.6;  Surgeon: Birder Robson, MD;  Location: [place];  Service: Ophthalmology;  Laterality: Left;  Diabetic  Sleep apnea   CESAREAN SECTION     x 2   CHOLECYSTECTOMY     COLONOSCOPY WITH PROPOFOL N/A 09/15/2021   Procedure: COLONOSCOPY WITH PROPOFOL;  Surgeon: Lin Landsman, MD;  Location: [facility];  Service: Gastroenterology;  Laterality: N/A;   HEEL SPUR EXCISION N/A    HYDRADENITIS EXCISION Right 12/31/2015   Procedure: EXCISION HIDRADENITIS AXILLA;  Surgeon: Clayburn Pert, MD;  Location: [facility];  Service: General;  Laterality: Right;   KNEE SURGERY Left    1998   TONSILLECTOMY      Current Medications: Current Meds  Medication Sig   acetaminophen (TYLENOL)  500 MG tablet Take 500 mg by mouth every 6 (six) hours as needed.   albuterol (PROVENTIL HFA;VENTOLIN HFA) 108 (90 Base) MCG/ACT inhaler Inhale 2 puffs into the lungs every 6 (six) hours as needed for wheezing or shortness of breath.   aspirin EC 81 MG tablet Take 81 mg by mouth daily.   atorvastatin (LIPITOR) 20 MG tablet Take 20 mg by mouth at bedtime.    budesonide-formoterol (SYMBICORT) 160-4.5 MCG/ACT inhaler Inhale 2 puffs into the lungs 2 (two) times daily.   clindamycin (CLINDAGEL) 1 % gel Apply to aa's under arms QD.   doxepin (SINEQUAN) 50 MG capsule Take 50 mg by mouth at bedtime.   DULoxetine (CYMBALTA) 20 MG capsule Take 40 mg by mouth daily.   empagliflozin (JARDIANCE) 10 MG TABS tablet Take 25 mg by mouth daily.   fluticasone (FLONASE) 50 MCG/ACT nasal spray    gabapentin (NEURONTIN) 300 MG capsule Take 600 mg by mouth 2 (two) times daily.   glucose blood test strip TEST three times a day   ibuprofen (ADVIL,MOTRIN) 800 MG tablet Take 1 tablet (800 mg total) by mouth every 8 (eight) hours as needed for mild pain or moderate pain (with food).   ipratropium (ATROVENT HFA) 17 MCG/ACT inhaler Inhale 2 puffs into the lungs every 6 (six) hours as needed for wheezing. Patient uses once a day   ipratropium-albuterol (DUONEB) 0.5-2.5 (3) MG/3ML SOLN Take 3 mLs by nebulization every 6 (six) hours as needed.   ketoconazole (NIZORAL) 2 % shampoo Shampoo into scalp let sit 10 minutes then wash out. Use 3d/wk.   Lancets Misc. (ACCU-CHEK FASTCLIX LANCET) KIT    levocetirizine (XYZAL) 5 MG tablet TAKE 1 TABLET BY MOUTH EVERY EVENING   levothyroxine (SYNTHROID, LEVOTHROID) 200 MCG tablet Take 175 mcg by mouth daily before breakfast.   Melatonin 5 MG CAPS Take 2 capsules by mouth daily.   mupirocin ointment (BACTROBAN) 2 % APPLY TOPICALLY EVERY DAY WITH DRESSING CHANGES   olopatadine (PATANOL) 0.1 % ophthalmic solution Place 1 drop into both eyes 2 (two) times daily.   omeprazole (PRILOSEC) 40 MG  capsule Take 40 mg by mouth daily.   OXYGEN Inhale into the lungs. With CPAP   potassium citrate (UROCIT-K) 10 MEQ (1080 MG) SR tablet TAKE ONE TABLET BY MOUTH EVERY MORNING   roflumilast (DALIRESP) 500 MCG TABS tablet Take 500 mcg by mouth daily.   tirzepatide [facility]) 5 MG/0.5ML Pen Inject 5 mg into the skin once a week. Saturday   torsemide (DEMADEX) 20 MG tablet TAKE ONE TABLET BY MOUTH DAILY   traZODone (DESYREL) 50 MG tablet Take 50-100 mg by mouth at bedtime as needed for sleep.      Allergies:   Codeine and Levofloxacin   Social History   Socioeconomic History   Marital status: Divorced    Spouse name: Not on file  Number of children: Not on file   Years of education: Not on file   Highest education level: Not on file  Occupational History   Occupation: disabled  Tobacco Use   Smoking status: Former    Packs/day: 3.00    Years: 20.00    Total pack years: 60.00    Types: Cigarettes    Quit date: 12/18/1993    Years since quitting: 28.3   Smokeless tobacco: Never  Vaping Use   Vaping Use: Never used  Substance and Sexual Activity   Alcohol use: No   Drug use: No    Types: Marijuana   Sexual activity: Never    Birth control/protection: Abstinence  Other Topics Concern   Not on file  Social History Narrative   Not on file   Social Determinants of Health   Financial Resource Strain: Low Risk  (03/09/2017)   Overall Financial Resource Strain (CARDIA)    Difficulty of Paying Living Expenses: Not hard at all  Food Insecurity: Unknown (03/09/2017)   Hunger Vital Sign    Worried About [place] in the Last Year: Patient refused    [place] in the Last Year: Patient refused  Transportation Needs: Unknown (03/09/2017)   [place] - Transportation    Lack of Transportation (Medical): Patient refused    Lack of Transportation (Non-Medical): Patient refused  Physical Activity: Unknown (03/09/2017)   Exercise Vital Sign    Days of Exercise per Week:  Patient refused    Minutes of Exercise per Session: Patient refused  Stress: No Stress Concern Present (03/09/2017)   [place] of Stress : Not at all  Social Connections: Somewhat Isolated (03/09/2017)   Social Connection and Isolation Panel [NHANES]    Frequency of Communication with Friends and Family: More than three times a week    Frequency of Social Gatherings with Friends and Family: Once a week    Attends Religious Services: More than 4 times per year    Active Member of [employer] or Organizations: No    Attends Music therapist: Never    Marital Status: Divorced     Family History: The patient's family history includes Breast cancer in her paternal grandmother; COPD in her mother; Heart disease in her father; Hypertension in her father; Rashes / Skin problems in her father. There is no history of Kidney cancer or Bladder Cancer.  ROS:   Please see the history of present illness.     All other systems reviewed and are negative.  EKGs/Labs/Other Studies Reviewed:    The following studies were reviewed today:   EKG:  EKG is  ordered today.  The ekg ordered today demonstrates sinus rhythm, RBBB, left anterior fascicular block.  Recent Labs: 05/12/2021: BUN 33; Creatinine, Ser 1.16; Potassium 4.4; Sodium 132  Recent Lipid Panel    Component Value Date/Time   CHOL 107 09/18/2012 0622   TRIG 282 (H) 09/18/2012 0622   HDL 29 (L) 09/18/2012 0622   VLDL 56 (H) 09/18/2012 0622   LDLCALC 22 09/18/2012 0622    Physical Exam:    VS:  BP (!) 110/58 (BP Location: Right Arm, Patient Position: Sitting, Cuff Size: Normal)   Pulse 85   Ht '5\' 3"'$  (1.6 m)   Wt 255 lb (115.7 kg)   SpO2 95%   BMI 45.17 kg/m     Wt Readings from Last 3 Encounters:  04/21/22 255 lb (115.7  kg)  04/19/22 254 lb 6.4 oz (115.4 kg)  04/11/22 253 lb (114.8 kg)     GEN:  Well nourished, well developed in no acute  distress, obese HEENT: Normal NECK: No JVD; No carotid bruits CARDIAC: RRR, no murmurs, rubs, gallops RESPIRATORY:  Clear to auscultation without rales, wheezing or rhonchi  ABDOMEN: Soft, non-tender, distended MUSCULOSKELETAL: Trace edema; No deformity  SKIN: Warm and dry NEUROLOGIC:  Alert and oriented x 3 PSYCHIATRIC:  Normal affect   ASSESSMENT:    1. Pre-operative cardiovascular examination   2. Chronic diastolic heart failure ([place])   3. Morbid obesity ([place])   4. Primary hypertension    PLAN:     Preop evaluation, right breast lumpectomy being planned.  Denies chest pain, echo 03/2021 with normal EF 60 to 65%.  Lumpectomy deemed low risk from a cardiac perspective.  Okay to proceed with procedure, no Additional cardiac testing or intervention needed. HFpEF, appears euvolemic, continue torsemide. Morbid obesity, low-calorie diet, weight loss advised.  Continue Mounjaro. hypertension, BP previously running low.  Improved with stopping Entresto and Aldactone.  Follow-up in 6 months.  This note was generated in part or whole with voice recognition software. Voice recognition is usually quite accurate but there are transcription errors that can and very often do occur. I apologize for any typographical errors that were not detected and corrected.  Medication Adjustments/Labs and Tests Ordered: Current medicines are reviewed at length with the patient today.  Concerns regarding medicines are outlined above.  Orders Placed This Encounter  Procedures   EKG 12-Lead   No orders of the defined types were placed in this encounter.   Patient Instructions  Medication Instructions:   Your physician recommends that you continue on your current medications as directed. Please refer to the Current Medication list given to you today.  *If you need a refill on your cardiac medications before your next appointment, please call your pharmacy*   Lab Work:  None ordered  If you have  labs (blood work) drawn today and your tests are completely normal, you will receive your results only by: [place] (if you have MyChart) OR A paper copy in the mail If you have any lab test that is abnormal or we need to change your treatment, we will call you to review the results.   Testing/Procedures:  None ordered   Follow-Up: At [facility], you and your health needs are our priority.  As part of our continuing mission to provide you with exceptional heart care, we have created designated Provider Care Teams.  These Care Teams include your primary Cardiologist (physician) and Advanced Practice Providers (APPs -  Physician Assistants and Nurse Practitioners) who all work together to provide you with the care you need, when you need it.  We recommend signing up for the patient portal called "MyChart".  Sign up information is provided on this After Visit Summary.  MyChart is used to connect with patients for Virtual Visits (Telemedicine).  Patients are able to view lab/test results, encounter notes, upcoming appointments, etc.  Non-urgent messages can be sent to your provider as well.   To learn more about what you can do with MyChart, go to [email or web address].    Your next appointment:   6 month(s)  Provider:   You may see Kate Sable, MD or one of the following Advanced Practice Providers on your designated Care Team:   Murray Hodgkins, NP Christell Faith, PA-C Cadence Kathlen Mody, PA-C Gerrie Nordmann, NP  Signed, Kate Sable, MD  04/21/2022 4:26 PM    [place]

## 2022-04-24 ENCOUNTER — Ambulatory Visit: Payer: 59

## 2022-04-25 ENCOUNTER — Telehealth: Payer: Self-pay | Admitting: Surgery

## 2022-04-25 NOTE — Telephone Encounter (Signed)
Unable to leave a voice message, please inform patient of the following regarding scheduled surgery with Dr. Hampton Abbot.   Pre-Admission date/time, and Surgery date at [facility].  Surgery Date: 05/09/22 Preadmission Testing Date: 05/02/22 (phone 1p-5p)  Also patient will need to call at [phone], between 1-3:00pm the day before surgery, to find out what time to arrive for surgery.    Patient also scheduled for RF tag placement at [facility] 04/26/22

## 2022-04-26 ENCOUNTER — Ambulatory Visit
Admission: RE | Admit: 2022-04-26 | Discharge: 2022-04-26 | Disposition: A | Discharge status: Home / Self Care | Payer: 59 | Source: Ambulatory Visit | Attending: Surgery | Admitting: Surgery

## 2022-04-26 ENCOUNTER — Ambulatory Visit: Payer: 59

## 2022-04-26 ENCOUNTER — Telehealth: Payer: Self-pay | Admitting: Cardiology

## 2022-04-26 DIAGNOSIS — R928 Other abnormal and inconclusive findings on diagnostic imaging of breast: Secondary | ICD-10-CM

## 2022-04-26 HISTORY — PX: BREAST BIOPSY: SHX20

## 2022-04-26 MED ORDER — LIDOCAINE HCL (PF) 1 % IJ SOLN
10.0000 mL | Freq: Once | INTRAMUSCULAR | Status: AC
  Administered 2022-04-26: 10 mL

## 2022-04-26 NOTE — Telephone Encounter (Signed)
Called patient again, she is now aware of all dates regarding her surgery.  

## 2022-04-26 NOTE — Telephone Encounter (Signed)
Patient states she plans to board a cruise ship in July and she would like to have an order placed for CPAP equipment that can board a ship.

## 2022-04-27 NOTE — Telephone Encounter (Signed)
Spoke to patient. She is aware that current cpap machine is portable and can be taken on the ship with her. She wears 2L bled into cpap and is requesting travel O2. Patient is aware that POC can not be worn during sleep. She is going to contact [place] to see what options are available for her and she call back with update.

## 2022-04-27 NOTE — Telephone Encounter (Signed)
Hey, is this something you can help with?

## 2022-05-01 ENCOUNTER — Telehealth: Payer: Self-pay

## 2022-05-01 ENCOUNTER — Ambulatory Visit: Payer: 59

## 2022-05-01 NOTE — Telephone Encounter (Signed)
Medical clearance received from Dr.Patel-low risk-patient optimized for surgery.

## 2022-05-02 ENCOUNTER — Other Ambulatory Visit: Payer: Self-pay

## 2022-05-02 ENCOUNTER — Encounter
Admission: RE | Admit: 2022-05-02 | Discharge: 2022-05-02 | Disposition: A | Discharge status: Home / Self Care | Payer: 59 | Source: Ambulatory Visit | Attending: Surgery | Admitting: Surgery

## 2022-05-02 DIAGNOSIS — Z01812 Encounter for preprocedural laboratory examination: Secondary | ICD-10-CM

## 2022-05-02 HISTORY — DX: Cardiac arrhythmia, unspecified: I49.9

## 2022-05-02 HISTORY — DX: Myoneural disorder, unspecified: G70.9

## 2022-05-02 HISTORY — DX: Headache, unspecified: R51.9

## 2022-05-02 HISTORY — DX: Dyspnea, unspecified: R06.00

## 2022-05-02 NOTE — Patient Instructions (Addendum)
Your procedure is scheduled on: 05/09/22 - Tuesday Report to the Registration Desk on the 1st floor of the [facility]. To find out your arrival time, please call [phone] between 1PM - 3PM on: 05/08/22 - Monday If your arrival time is 6:00 am, do not arrive before that time as the [place] entrance doors do not open until 6:00 am.  REMEMBER: Instructions that are not followed completely may result in serious medical risk, up to and including death; or upon the discretion of your surgeon and anesthesiologist your surgery may need to be rescheduled.  Do not eat food after midnight the night before surgery.  No gum chewing or hard candies.  You may however, drink CLEAR liquids up to 2 hours before you are scheduled to arrive for your surgery. Do not drink anything within 2 hours of your scheduled arrival time.  Type 1 and Type 2 diabetics should only drink water.  One week prior to surgery: Stop Anti-inflammatories (NSAIDS) such as Advil, Aleve, Ibuprofen, Motrin, Naproxen, Naprosyn and Aspirin based products such as Excedrin, Goody's Powder, BC Powder.  Stop beginning 05/03/22, ANY OVER THE COUNTER supplements until after surgery: BIOTIN   You may however, continue to take Tylenol if needed for pain up until the day of surgery.  Continue taking all prescribed medications with the exception of the following:  - empagliflozin (JARDIANCE) hold 3 days, beginning 05/06/22. - tirzepatide [facility]) hold 7 days beginning 05/02/22. - Hold aspirin EC beginning 05/03/22.   TAKE ONLY THESE MEDICATIONS THE MORNING OF SURGERY WITH A SIP OF WATER:  SYMBICORT  DULoxetine (CYMBALTA)  gabapentin (NEURONTIN)  4.   levothyroxine (SYNTHROID, LEVOTHROID)  5.   olopatadine (PATANOL)  6.  omeprazole (PRILOSEC) - (take one the night before and one on the morning of surgery - helps to prevent nausea after surgery.) 7.  roflumilast (DALIRESP)   Use albuterol (PROVENTIL HFA;VENTOLIN HFA)  on  the day of surgery and bring to the hospital.  No Alcohol for 24 hours before or after surgery.  No Smoking including e-cigarettes for 24 hours before surgery.  No chewable tobacco products for at least 6 hours before surgery.  No nicotine patches on the day of surgery.  Do not use any "recreational" drugs for at least a week (preferably 2 weeks) before your surgery.  Please be advised that the combination of cocaine and anesthesia may have negative outcomes, up to and including death. If you test positive for cocaine, your surgery will be cancelled.  On the morning of surgery brush your teeth with toothpaste and water, you may rinse your mouth with mouthwash if you wish. Do not swallow any toothpaste or mouthwash.  Use CHG Soap or wipes as directed on instruction sheet.  Do not wear jewelry, make-up, hairpins, clips or nail polish.  Do not wear lotions, powders, or perfumes.   Do not shave body hair from the neck down 48 hours before surgery.  Contact lenses, hearing aids and dentures may not be worn into surgery.  Do not bring valuables to the hospital. [facility] is not responsible for any missing/lost belongings or valuables.   Bring your C-PAP to the hospital in case you may have to spend the night.   Notify your doctor if there is any change in your medical condition (cold, fever, infection).  Wear comfortable clothing (specific to your surgery type) to the hospital.  After surgery, you can help prevent lung complications by doing breathing exercises.  Take deep breaths and  cough every 1-2 hours. Your doctor may order a device called an Incentive Spirometer to help you take deep breaths. When coughing or sneezing, hold a pillow firmly against your incision with both hands. This is called "splinting." Doing this helps protect your incision. It also decreases belly discomfort.  If you are being admitted to the hospital overnight, leave your suitcase in the car. After surgery  it may be brought to your room.  In case of increased patient census, it may be necessary for you, the patient, to continue your postoperative care in the Same Day Surgery department.  If you are being discharged the day of surgery, you will not be allowed to drive home. You will need a responsible individual to drive you home and stay with you for 24 hours after surgery.   If you are taking public transportation, you will need to have a responsible individual with you.  Please call the [place] Dept. at [phone] if you have any questions about these instructions.  Surgery Visitation Policy:  Patients undergoing a surgery or procedure may have two family members or support persons with them as long as the person is not COVID-19 positive or experiencing its symptoms.   Inpatient Visitation:    Visiting hours are 7 a.m. to 8 p.m. Up to four visitors are allowed at one time in a patient room. The visitors may rotate out with other people during the day. One designated support person (adult) may remain overnight.  Due to an increase in RSV and influenza rates and associated hospitalizations, children ages 61 and under will not be able to visit patients in [facility]. Masks continue to be strongly recommended.

## 2022-05-03 ENCOUNTER — Encounter: Payer: Self-pay | Admitting: Surgery

## 2022-05-03 ENCOUNTER — Ambulatory Visit: Payer: 59

## 2022-05-03 NOTE — Progress Notes (Unsigned)
Pulmonary Clearance has been received from Dr Mortimer Fries. The patient is cleared at Medium risk for surgery.

## 2022-05-03 NOTE — Progress Notes (Signed)
Perioperative / Anesthesia Services  Pre-Admission Testing Clinical Review / Preoperative Anesthesia Consult  Date: 05/08/22  Patient Demographics:  Name: [name] [name] DOB:   30-Jan-1956 MRN:   [id number]  Planned Surgical Procedure(s):    Case: [id number] Date/Time: 05/09/22 1220   Procedure: BREAST LUMPECTOMY WITH RADIOFREQUENCY TAG IDENTIFICATION (Right)   Anesthesia type: General   Pre-op diagnosis: right breast intraductal papilloma   Location: [facility] OR ROOM 04 / [place] [facility] FOR [facility]EST[facility]IA [facility]   Surgeons: Olean Ree, MD   NOTE: Available PAT nursing documentation and vital signs have been reviewed. Clinical nursing staff has updated patient's PMH/PSHx, current medication list, and drug allergies/intolerances to ensure comprehensive history available to assist in medical decision making as it pertains to the aforementioned surgical procedure and anticipated anesthetic course. Extensive review of available clinical information personally performed. [name] [name] PMH and PSHx updated with any diagnoses/procedures that  may have been inadvertently omitted during her intake with the pre-admission testing department's nursing staff.  Clinical Discussion:  [name] [name] is a 67 y.o. female who is submitted for pre-surgical anesthesia review and clearance prior to her undergoing the above procedure. Patient is a Former Smoker (60 pack years; quit 12/1993). Pertinent PMH includes: HFpEF, mitral valve stenosis, PVD, aortic atherosclerosis, bifascicular block (RBBB + LAFB), HTN, HLD, hypothyroidism, asthma, COPD, OSAH (requires nocturnal PAP therapy), GERD (on daily PPI), IDA, RIGHT breast papilloma, OA, DJD, peripheral neuropathy, depression, insomnia.   Patient is followed by cardiology Garen Lah, MD). She was last seen in the cardiology clinic on 04/21/2022; notes reviewed. At the time of her clinic visit, patient doing well overall from a cardiovascular perspective. Patient denied any chest  pain, shortness of breath, PND, orthopnea, palpitations, significant peripheral edema, weakness, fatigue, vertiginous symptoms, or presyncope/syncope. Patient with a past medical history significant for cardiovascular diagnoses. Documented physical exam was grossly benign, providing no evidence of acute exacerbation and/or decompensation of the patient's known cardiovascular conditions.  Most recent myocardial perfusion imaging study was performed on 05/26/2019 revealing a normal left ventricular systolic function with an EF of 55-65%.  There was no evidence of significant stress-induced myocardial ischemia or arrhythmia; no scintigraphic evidence of scar.  Study suboptimal overall due to body habitus and significant GI uptake.  Study determined to be normal and low risk.  Most recent TTE was performed on 04/04/2021 revealing a normal left ventricular systolic function with EF of 60-65%.  There were no regional wall motion abnormalities. Left ventricular diastolic Doppler parameters consistent with pseudonormalization (G2DD).  Right ventricular size and function normal.  There was mild to moderate biatrial enlargement.  There was no evidence of any significant valvular regurgitation noted.  There was moderate mitral annular calcification with resulting mild stenosis of the mitral valve.  Mean transmitral valve gradient 5.0 mmHg.    Blood pressure well controlled at 110/58 mmHg on currently prescribed diuretic (torsemide) monotherapy.  Patient is on atorvastatin for her HLD diagnosis and ASCVD.  In the setting of known HFpEF, patient is on added protection using an SGLT2i (empagliflozin).  Patient does have a significant OSAH diagnosis and is on prescribed nocturnal PAP therapy.  Patient requires supplemental oxygen to be bled in with her PAP therapy at night.  Blood pressures previously running a low, therefore interventions for her HFpEF (Entresto and Aldactone) discontinued. She does not have an OSAH  diagnosis. Functional capacity somewhat limited by obesity and multiple medical comorbidities, however with that being said, patient still felt to be able to achieve at least  4 METS of physical activity without experiencing any degree of angina/anginal equivalent symptoms. No changes were made to her medication regimen during her visit with cardiology.  Patient scheduled to follow-up with outpatient cardiology in 3 months or sooner if needed.  [name] [name] is scheduled for a RIGHT BREAST LUMPECTOMY WITH RADIOFREQUENCY TAG IDENTIFICATION on [id number] with Dr. Olean Ree, MD. Given patient's past medical history significant for cardiovascular/cardiopulmonary diagnoses, in addition to her multiple medical comorbidities, presurgical clearances were sought from patient's PCP, cardiologist, and pulmonary medicine provider.  Clearances were obtained as follows.  Per family/internal medicine Posey Pronto, MD), "patient is optimized for surgery and may proceed with the planned procedural course with a LOW risk of significant perioperative complications".  Per pulmonary medicine Mortimer Fries, MD), "this patient is optimized for surgery and may proceed with the planned procedural course with a MODERATE to HIGH risk of significant perioperative cardiopulmonary complications".  Per cardiology Garen Lah, MD), "patient denies chest pain. Echocardiogram in 03/2021 with normal EF 60-65%. Lumpectomy deemed low risk from a cardiac perspective.  Okay to proceed with procedure with no additional cardiac testing or intervention needed".  In review of her medication reconciliation, it is noted that patient is currently on prescribed daily antiplatelet therapy. She has been instructed on recommendations for holding her daily low dose ASA for 5 days prior to her procedure with plans to restart as soon as postoperative bleeding risk felt to be minimized by her attending surgeon. The patient has been instructed that her last dose of her  ASA should be on 05/03/2022.  Patient reports previous perioperative complications with anesthesia in the past. Patient has a PMH (+) for PONV. Symptoms and history of PONV will be discussed with patient by anesthesia team on the day of her procedure. Interventions will be ordered as deemed necessary based on patient's individual care needs as determined by anesthesiologist. In review of the available records, it is noted that patient underwent a MAC anesthetic course at [facility] (ASA III) in 03/2022 without documented complications.      04/21/2022    4:04 PM 04/19/2022    2:51 PM 04/11/2022    8:21 AM  Vitals with BMI  Height 5' 3"$  5' 3"$    Weight 255 lbs 254 lbs 6 oz   BMI 0000000 [id number]   Systolic [id number] [id number]  Diastolic 58 71 61  Pulse 85 92 81    Providers/Specialists:   NOTE: Primary physician provider listed below. Patient may have been seen by APP or partner within same practice.   PROVIDER ROLE / SPECIALTY LAST Delight Stare, MD General Surgery (Surgeon) 04/19/2022  Denton Lank, MD Primary Care Provider ???  Kate Sable, MD Cardiology 04/21/2022  Flora Lipps, MD Pulmonary Medicine 01/25/2022  Darylene Price, FNP-C Advanced Heart Failure 02/13/2022   Allergies:  Codeine and Levofloxacin  Current Home Medications:   No current facility-administered medications for this encounter.    acetaminophen (TYLENOL) 500 MG tablet   albuterol (PROVENTIL HFA;VENTOLIN HFA) 108 (90 Base) MCG/ACT inhaler   aspirin EC 81 MG tablet   atorvastatin (LIPITOR) 20 MG tablet   BIOTIN MAXIMUM PO   budesonide-formoterol (SYMBICORT) 160-4.5 MCG/ACT inhaler   doxepin (SINEQUAN) 50 MG capsule   DULoxetine (CYMBALTA) 20 MG capsule   empagliflozin (JARDIANCE) 10 MG TABS tablet   fluticasone (FLONASE) 50 MCG/ACT nasal spray   gabapentin (NEURONTIN) 300 MG capsule   glucose blood test strip   ibuprofen (ADVIL,MOTRIN) 800 MG tablet   ipratropium (ATROVENT HFA)  17 MCG/ACT  inhaler   ipratropium-albuterol (DUONEB) 0.5-2.5 (3) MG/3ML SOLN   ketoconazole (NIZORAL) 2 % shampoo   Lancets Misc. (ACCU-CHEK FASTCLIX LANCET) KIT   levocetirizine (XYZAL) 5 MG tablet   levothyroxine (SYNTHROID, LEVOTHROID) 200 MCG tablet   Melatonin 5 MG CAPS   olopatadine (PATANOL) 0.1 % ophthalmic solution   omeprazole (PRILOSEC) 40 MG capsule   OXYGEN   potassium citrate (UROCIT-K) 10 MEQ (1080 MG) SR tablet   psyllium (METAMUCIL) 58.6 % powder   roflumilast (DALIRESP) 500 MCG TABS tablet   tirzepatide (MOUNJARO) 5 MG/0.5ML Pen   torsemide (DEMADEX) 20 MG tablet   traZODone (DESYREL) 50 MG tablet   History:   Past Medical History:  Diagnosis Date   (HFpEF) heart failure with preserved ejection fraction (HCC)    a.) TTE 05/21/2016: EF 50%, mild LV dil, ant HK, mild RAE, G1DD; b.) TTE 06/06/2019: EF 60-65%, G2DD; c.) TTE 04/04/2021: EF 60-65%, mod LAE, mild RAE, mod MAC, mild MV stenosis, G2DD   Allergic rhinitis    Allergy    Aortic atherosclerosis (HCC)    Asthma    Chronic back pain    COPD (chronic obstructive pulmonary disease) (HCC)    Degenerative joint disease of knee, left    Depression    Diverticulosis    Dyspnea    Edema    GERD (gastroesophageal reflux disease)    Headache    Hidradenitis    History of bilateral cataract extraction    History of colonic polyps    HLD (hyperlipidemia)    Hypertension    Hypothyroidism    IBS (irritable bowel syndrome)    IDA (iron deficiency anemia)    Insomnia    a.) takes trazodone PRN   Intraductal papilloma of breast, right    Mitral valve stenosis 04/04/2021   a.) TTE 04/04/2021: mild MS (MPG 5.0 mm Hg).   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    Sinusitis, chronic    T2DM  (type 2 diabetes mellitus) ([place])    Vaginitis, atrophic    Vertigo    Vitamin D deficiency    Past Surgical History:  Procedure Laterality Date   ABDOMINAL HYSTERECTOMY     AXILLARY HIDRADENITIS EXCISION     BREAST BIOPSY Right 02/22/2022   stereo bx, calcs, "X" clip- ATYPICAL INTRADUCTAL PAPILLARY PROLIFERATION WITH SCLEROSIS AND CALCIFICATION PROLIFERATION WITH SCLEROSIS AND CALCIFICATION   BREAST BIOPSY Right 02/22/2022   MM RT BREAST BX W LOC DEV 1ST LESION IMAGE BX SPEC STEREO GUIDE 02/22/2022 [facility]   BREAST BIOPSY Right 04/04/2022   rt br calcs coil clip, PASH   BREAST BIOPSY Right 04/04/2022   MM RT BREAST BX W LOC DEV 1ST LESION IMAGE BX SPEC STEREO GUIDE 04/04/2022 [facility]   BREAST BIOPSY Right 04/26/2022   MM RT RADIO FREQUENCY TAG EA ADD LESION LOC MAMMO GUIDE 04/26/2022 [facility]   BREAST BIOPSY Right 04/26/2022   MM RT RADIO FREQUENCY TAG LOC MAMMO GUIDE 04/26/2022 [facility]   BREAST EXCISIONAL BIOPSY Bilateral [id number]   RUPTURED FOLLICULAR CYSTS WITH ABSCESSES AND SCARRING, CONSISTENT WITH HIDRADENITIS SUPPURATIVA.    CATARACT EXTRACTION W/PHACO Right 07/27/2020   Procedure: CATARACT EXTRACTION PHACO AND INTRAOCULAR LENS PLACEMENT (IOC) RIGHT DIABETIC  17.13 01:23.9;  Surgeon: Birder Robson, MD;  Location: [place];  Service: Ophthalmology;  Laterality: Right   CATARACT EXTRACTION W/PHACO Left 04/11/2022   Procedure: CATARACT EXTRACTION PHACO AND INTRAOCULAR LENS PLACEMENT (IOC) LEFT DIABETIC 15.05 01:18.6;  Surgeon: Birder Robson, MD;  Location: [place];  Service: Ophthalmology;  Laterality: Left   CESAREAN SECTION     x 2   CHOLECYSTECTOMY     COLONOSCOPY WITH PROPOFOL N/A 09/15/2021   Procedure: COLONOSCOPY WITH PROPOFOL;  Surgeon: Lin Landsman, MD;  Location: [facility];  Service: Gastroenterology;  Laterality: N/A;   HEEL SPUR EXCISION N/A    HYDRADENITIS EXCISION Right 12/31/2015   Procedure:  EXCISION HIDRADENITIS AXILLA;  Surgeon: Clayburn Pert, MD;  Location: [facility];  Service: General;  Laterality: Right;   KNEE SURGERY Left    1998   TONSILLECTOMY     Family History  Problem Relation Age of Onset   Rashes / Skin problems Father    Hypertension Father    Heart disease Father    Breast cancer Paternal Grandmother    COPD Mother    Kidney cancer Neg Hx    Bladder Cancer Neg Hx    Social History   Tobacco Use   Smoking status: Former    Packs/day: 3.00    Years: 20.00    Total pack years: 60.00    Types: Cigarettes    Quit date: 12/18/1993    Years since quitting: 28.4   Smokeless tobacco: Never  Vaping Use   Vaping Use: Never used  Substance Use Topics   Alcohol use: No   Drug use: No    Types: Marijuana    Pertinent Clinical Results:  LABS:    Ref Range & Units 02/21/2022  WBC (White Blood Cell Count) 4.1 - 10.2 10^3/uL 10.8 High   RBC (Red Blood Cell Count) 4.04 - 5.48 10^6/uL 5.19  Hemoglobin 12.0 - 15.0 gm/dL 14.6  Hematocrit 35.0 - 47.0 % 45.4  MCV (Mean Corpuscular Volume) 80.0 - 100.0 fl 87.5  MCH (Mean Corpuscular Hemoglobin) 27.0 - 31.2 pg 28.1  MCHC (Mean Corpuscular Hemoglobin Concentration) 32.0 - 36.0 gm/dL 32.2  Platelet Count 150 - 450 10^3/uL 263  RDW-CV (Red Cell Distribution Width) 11.6 - 14.8 % 13.2  MPV (Mean Platelet Volume) 9.4 - 12.4 fl 8.9 Low   Neutrophils 1.50 - 7.80 10^3/uL 9.37 High   Lymphocytes 1.00 - 3.60 10^3/uL 0.97 Low   Monocytes 0.00 - 1.50 10^3/uL 0.31  Eosinophils 0.00 - 0.55 10^3/uL 0.02  Basophils 0.00 - 0.09 10^3/uL 0.05  Neutrophil % 32.0 - 70.0 % 86.9 High   Lymphocyte % 10.0 - 50.0 % 9.0 Low   Monocyte % 4.0 - 13.0 % 2.9 Low   Eosinophil % 1.0 - 5.0 % 0.2 Low   Basophil% 0.0 - 2.0 % 0.5  Immature Granulocyte % <=0.7 % 0.5  Immature Granulocyte Count <=0.06 10^3/L 0.05  Resulting Agency  [facility] - LAB  Specimen Collected: 02/21/22 16:26   Performed by: [facility] - LAB Last  Resulted: 02/21/22 17:08  Received From: [place]  Result Received: 03/06/22 08:49    Ref Range & Units 02/21/2022  Glucose 70 - 110 mg/dL 279 High   Sodium 136 - 145 mmol/L 137  Potassium 3.6 - 5.1 mmol/L 4.0  Chloride 97 - 109 mmol/L 97  Carbon Dioxide (CO2) 22.0 - 32.0 mmol/L 33.8 High   Urea Nitrogen (BUN) 7 - 25 mg/dL 17  Creatinine 0.6 - 1.1 mg/dL 1.1  Glomerular Filtration Rate (eGFR) >60 mL/min/1.73sq m 56 Low   Calcium 8.7 - 10.3 mg/dL 9.8  AST 8 - 39 U/L 16  ALT 5 - 38 U/L 23  Alk Phos (alkaline Phosphatase) 34 - 104 U/L 153 High   Albumin 3.5 - 4.8 g/dL 4.1  Bilirubin, Total 0.3 - 1.2 mg/dL 0.5  Protein, Total 6.1 - 7.9 g/dL 6.9  A/G Ratio 1.0 - 5.0 gm/dL 1.5  Resulting Agency  [facility] - LAB  Specimen Collected: 02/21/22 16:26   Performed by: [place]: 02/21/22 17:46  Received From: [place]  Result Received: 03/06/22 08:49    ECG: Date: 04/21/2022 Time ECG obtained: 0409 PM Rate: 85 bpm Rhythm: normal sinus; RBBB; LAFB Axis (leads I and aVF): Normal Intervals: PR 184 ms. QRS 132 ms. QTc 444 ms. ST segment and T wave changes: No evidence of acute ST segment elevation or depression. Evidence of an age undetermined lateral infarct present.  Comparison: Similar to previous tracing obtained on 11/11/2021   IMAGING / PROCEDURES: TRANSTHORACIC ECHOCARDIOGRAM performed on 04/04/2021 Normal left ventricular systolic function with an EF of 60-65% There were no regional wall motion abnormalities Left ventricular diastolic Doppler parameters consistent with pseudonormalization (G2DD). Normal right ventricular systolic function Left atrium moderately dilated Right atrium mildly dilated Moderate mitral annular calcification Mild mitral valve stenosis with a mean pressure gradient of 5.0 mmHg No significant valvular regurgitation No pericardial effusion  MYOCARDIAL PERFUSION IMAGING  STUDY (LEXISCAN) performed on 05/26/2019 Normal left ventricular systolic function with an EF of 55-65% No evidence of stress induced myocardial ischemia or arrhythmia suboptimal study due to body habitus and significant GI uptake Normal low risk study  Impression and Plan:  [name] [name] has been referred for pre-anesthesia review and clearance prior to her undergoing the planned anesthetic and procedural courses. Available labs, pertinent testing, and imaging results were personally reviewed by me in preparation for upcoming operative/procedural course. [facility] medical record has been updated following extensive record review and patient interview with PAT staff.   This patient has been appropriately cleared by internal/family medicine (LOW), cardiology (LOW), and by pulmonary medicine (MODERATE to HIGH) with the individually indicated risk of significant perioperative complications. Based on clinical review performed today (05/08/22), barring any significant acute changes in the patient's overall condition, it is anticipated that she will be able to proceed with the planned surgical intervention. Any acute changes in clinical condition may necessitate her procedure being postponed and/or cancelled. Patient will meet with anesthesia team (MD and/or CRNA) on the day of her procedure for preoperative evaluation/assessment. Questions regarding anesthetic course will be fielded at that time.   Pre-surgical instructions were reviewed with the patient during her PAT appointment, and questions were fielded to satisfaction by PAT clinical staff. She has been instructed on which medications that she will need to hold prior to surgery, as well as the ones that have been deemed safe/appropriate to take of the day of her procedure. As part of the general education provided by PAT, patient made aware both verbally and in writing, that she would need to abstain from the use of any illegal substances during her  perioperative course.  She was advised that failure to follow the provided instructions could necessitate case cancellation or result serious perioperative complications up to and including death. Patient encouraged to contact PAT and/or her surgeon's office to discuss any questions or concerns that  may arise prior to surgery; verbalized understanding.   Honor Loh, MSN, APRN, FNP-C, CEN [facility]  Peri-operative Services Nurse Practitioner Phone: [phone] Fax: [phone] 05/08/22 9:47 AM  NOTE: This note has been prepared using Dragon dictation software. Despite my best ability to proofread, there is always the potential that unintentional transcriptional errors may still occur from this process.

## 2022-05-04 NOTE — Progress Notes (Signed)
  Perioperative Services Pre-Admission/Anesthesia Testing    Date: 05/04/22  Name: [name] [name] MRN:   [id number]  Re: GLP-1 clearance and provider recommendations   Planned Surgical Procedure(s):    Case: [id number] Date/Time: 05/09/22 1220   Procedure: BREAST LUMPECTOMY WITH RADIOFREQUENCY TAG IDENTIFICATION (Right)   Anesthesia type: General   Pre-op diagnosis: RIGHT breast intraductal papilloma   Location: [facility] OR ROOM 04 / [place] [facility]   Surgeons: Olean Ree, MD   Clinical Notes:  Patient is scheduled for the above procedure with the indicated provider/surgeon. In review of her medication reconciliation it was noted that patient is on a prescribed GLP-1 medication. Per guidelines issued by the American Society of Anesthesiologists (ASA), it is recommended that these medications be held for 7 days prior to the patient undergoing any type of elective surgical procedure. The patient is taking the following GLP-1 medication:  '[]'$  SEMAGLUTIDE   '[]'$  EXENATIDE  '[]'$  LIRAGLUTIDE   '[]'$  LIXISENATIDE  '[]'$  DULAGLUTIDE     '[x]'$  OTHER GLP-1/GIP medication: tirzepatide  Reached out to prescribing provider Posey Pronto, MD) to make them aware of the guidelines from anesthesia. Given that this patient takes the prescribed GLP-1 medication for her  diabetes diagnosis, rather than for weight loss, recommendations from the prescribing provider were solicited. Prescribing provider made aware of the following so that informed decision/POC can be developed for this patient that may be taking medications belonging to these drug classes:  Oral GLP-1 medications will be held 1 day prior to surgery.  Injectable GLP-1 medications will be held 7 days prior to surgery.  Metformin is routinely held 48 hours prior to surgery due to renal concerns, potential need for contrasted imaging perioperatively, and the potential for tissue hypoxia leading to drug induced lactic acidosis.  All SGLT2i  medications are held 72 hours prior to surgery as they can be associated with the increased potential for developing euglycemic diabetic ketoacidosis (EDKA).   Impression and Plan:  [name] is on a prescribed GLP-1 medication, which induces the known side effect of decreased gastric emptying. Efforts are bring made to mitigate the risk of perioperative hyperglycemic events, as elevated blood glucose levels have been found to contribute to intra/postoperative complications. Additionally, hyperglycemic extremes can potentially necessitate the postponing of a patient's elective case in order to better optimize perioperative glycemic control, again with the aforementioned guidelines in place. With this in mind, recommendations have been sought from the prescribing provider, who has cleared patient to proceed with holding the prescribed GLP-1/GIP as per the guidelines from the ASA.   Provider recommending: no further recommendations received from the prescribing provider.  Copy of signed clearance and recommendations placed on patient's chart for inclusion in their medical record and for review by the surgical/anesthetic team on the day of her procedure.   Honor Loh, MSN, APRN, FNP-C, CEN [facility]  Peri-operative Services Nurse Practitioner Phone: [phone] 05/04/22 1:11 PM  NOTE: This note has been prepared using Dragon dictation software. Despite my best ability to proofread, there is always the potential that unintentional transcriptional errors may still occur from this process.

## 2022-05-08 ENCOUNTER — Ambulatory Visit: Payer: 59

## 2022-05-09 ENCOUNTER — Other Ambulatory Visit: Payer: Self-pay

## 2022-05-09 ENCOUNTER — Ambulatory Visit
Admission: RE | Admit: 2022-05-09 | Discharge: 2022-05-09 | Disposition: A | Discharge status: Home / Self Care | Payer: 59 | Source: Ambulatory Visit | Attending: Surgery | Admitting: Surgery

## 2022-05-09 ENCOUNTER — Ambulatory Visit: Payer: 59 | Admitting: Urgent Care

## 2022-05-09 ENCOUNTER — Encounter
Admission: RE | Disposition: A | Discharge status: Home / Self Care | Payer: Self-pay | Source: Ambulatory Visit | Attending: Surgery

## 2022-05-09 ENCOUNTER — Encounter: Payer: Self-pay | Admitting: Surgery

## 2022-05-09 DIAGNOSIS — N6091 Unspecified benign mammary dysplasia of right breast: Secondary | ICD-10-CM | POA: Diagnosis not present

## 2022-05-09 DIAGNOSIS — E785 Hyperlipidemia, unspecified: Secondary | ICD-10-CM | POA: Insufficient documentation

## 2022-05-09 DIAGNOSIS — I452 Bifascicular block: Secondary | ICD-10-CM | POA: Insufficient documentation

## 2022-05-09 DIAGNOSIS — I5032 Chronic diastolic (congestive) heart failure: Secondary | ICD-10-CM | POA: Insufficient documentation

## 2022-05-09 DIAGNOSIS — Z90710 Acquired absence of both cervix and uterus: Secondary | ICD-10-CM | POA: Insufficient documentation

## 2022-05-09 DIAGNOSIS — K219 Gastro-esophageal reflux disease without esophagitis: Secondary | ICD-10-CM | POA: Insufficient documentation

## 2022-05-09 DIAGNOSIS — Z7984 Long term (current) use of oral hypoglycemic drugs: Secondary | ICD-10-CM | POA: Diagnosis not present

## 2022-05-09 DIAGNOSIS — Z803 Family history of malignant neoplasm of breast: Secondary | ICD-10-CM | POA: Diagnosis not present

## 2022-05-09 DIAGNOSIS — F32A Depression, unspecified: Secondary | ICD-10-CM | POA: Diagnosis not present

## 2022-05-09 DIAGNOSIS — J449 Chronic obstructive pulmonary disease, unspecified: Secondary | ICD-10-CM | POA: Insufficient documentation

## 2022-05-09 DIAGNOSIS — E1140 Type 2 diabetes mellitus with diabetic neuropathy, unspecified: Secondary | ICD-10-CM | POA: Insufficient documentation

## 2022-05-09 DIAGNOSIS — Z01812 Encounter for preprocedural laboratory examination: Secondary | ICD-10-CM

## 2022-05-09 DIAGNOSIS — I700 Atherosclerosis of aorta: Secondary | ICD-10-CM | POA: Diagnosis not present

## 2022-05-09 DIAGNOSIS — N62 Hypertrophy of breast: Secondary | ICD-10-CM | POA: Diagnosis not present

## 2022-05-09 DIAGNOSIS — D241 Benign neoplasm of right breast: Secondary | ICD-10-CM

## 2022-05-09 DIAGNOSIS — D0511 Intraductal carcinoma in situ of right breast: Secondary | ICD-10-CM | POA: Diagnosis present

## 2022-05-09 DIAGNOSIS — E1151 Type 2 diabetes mellitus with diabetic peripheral angiopathy without gangrene: Secondary | ICD-10-CM | POA: Diagnosis not present

## 2022-05-09 DIAGNOSIS — R928 Other abnormal and inconclusive findings on diagnostic imaging of breast: Secondary | ICD-10-CM

## 2022-05-09 DIAGNOSIS — Z79890 Hormone replacement therapy (postmenopausal): Secondary | ICD-10-CM | POA: Insufficient documentation

## 2022-05-09 DIAGNOSIS — E039 Hypothyroidism, unspecified: Secondary | ICD-10-CM | POA: Diagnosis not present

## 2022-05-09 DIAGNOSIS — I110 Hypertensive heart disease with heart failure: Secondary | ICD-10-CM | POA: Insufficient documentation

## 2022-05-09 DIAGNOSIS — G4733 Obstructive sleep apnea (adult) (pediatric): Secondary | ICD-10-CM | POA: Diagnosis not present

## 2022-05-09 DIAGNOSIS — Z87891 Personal history of nicotine dependence: Secondary | ICD-10-CM | POA: Insufficient documentation

## 2022-05-09 DIAGNOSIS — Z7951 Long term (current) use of inhaled steroids: Secondary | ICD-10-CM | POA: Diagnosis not present

## 2022-05-09 DIAGNOSIS — Z6841 Body Mass Index (BMI) 40.0 and over, adult: Secondary | ICD-10-CM | POA: Insufficient documentation

## 2022-05-09 DIAGNOSIS — J45909 Unspecified asthma, uncomplicated: Secondary | ICD-10-CM | POA: Diagnosis not present

## 2022-05-09 HISTORY — DX: Diverticulosis of intestine, part unspecified, without perforation or abscess without bleeding: K57.90

## 2022-05-09 HISTORY — DX: Polyneuropathy, unspecified: G62.9

## 2022-05-09 HISTORY — DX: Hyperlipidemia, unspecified: E78.5

## 2022-05-09 HISTORY — DX: Cataract extraction status, right eye: Z98.41

## 2022-05-09 HISTORY — DX: Unspecified diastolic (congestive) heart failure: I50.30

## 2022-05-09 HISTORY — DX: Other chronic pain: G89.29

## 2022-05-09 HISTORY — DX: Type 2 diabetes mellitus without complications: E11.9

## 2022-05-09 HISTORY — PX: BREAST LUMPECTOMY WITH RADIOFREQUENCY TAG IDENTIFICATION: SHX6884

## 2022-05-09 HISTORY — DX: Benign neoplasm of right breast: D24.1

## 2022-05-09 HISTORY — DX: Bifascicular block: I45.2

## 2022-05-09 HISTORY — DX: Iron deficiency anemia, unspecified: D50.9

## 2022-05-09 HISTORY — DX: Atherosclerosis of aorta: I70.0

## 2022-05-09 HISTORY — DX: Cataract extraction status, right eye: Z98.42

## 2022-05-09 HISTORY — DX: Obstructive sleep apnea (adult) (pediatric): G47.33

## 2022-05-09 LAB — GLUCOSE, CAPILLARY
Glucose-Capillary: 283 mg/dL — ABNORMAL HIGH (ref 70–99)
Glucose-Capillary: 247 mg/dL — ABNORMAL HIGH (ref 70–99)
Glucose-Capillary: 271 mg/dL — ABNORMAL HIGH (ref 70–99)
Glucose-Capillary: 240 mg/dL — ABNORMAL HIGH (ref 70–99)

## 2022-05-09 SURGERY — BREAST LUMPECTOMY WITH RADIOFREQUENCY TAG IDENTIFICATION
Anesthesia: General | Laterality: Right

## 2022-05-09 MED ORDER — DEXMEDETOMIDINE HCL IN NACL 80 MCG/20ML IV SOLN
INTRAVENOUS | Status: DC | PRN
  Administered 2022-05-09: 12 ug via BUCCAL
  Administered 2022-05-09: 8 ug via BUCCAL

## 2022-05-09 MED ORDER — GABAPENTIN 300 MG PO CAPS: 300.0000 mg | ORAL_CAPSULE | ORAL | Status: DC

## 2022-05-09 MED ORDER — IPRATROPIUM-ALBUTEROL 0.5-2.5 (3) MG/3ML IN SOLN
3.0000 mL | RESPIRATORY_TRACT | Status: DC
  Administered 2022-05-09: 3 mL via RESPIRATORY_TRACT

## 2022-05-09 MED ORDER — ACETAMINOPHEN 10 MG/ML IV SOLN: 1000.0000 mg | Freq: Once | INTRAVENOUS | Status: AC

## 2022-05-09 MED ORDER — SUCCINYLCHOLINE CHLORIDE 200 MG/10ML IV SOSY
PREFILLED_SYRINGE | INTRAVENOUS | Status: AC
  Filled 2022-05-09: qty 10

## 2022-05-09 MED ORDER — ACETAMINOPHEN 500 MG PO TABS: 1000.0000 mg | ORAL_TABLET | ORAL | Status: AC

## 2022-05-09 MED ORDER — ONDANSETRON HCL 4 MG/2ML IJ SOLN
INTRAMUSCULAR | Status: DC | PRN
  Administered 2022-05-09: 4 mg via INTRAVENOUS

## 2022-05-09 MED ORDER — ROCURONIUM BROMIDE 100 MG/10ML IV SOLN
INTRAVENOUS | Status: DC | PRN
  Administered 2022-05-09: 50 mg via INTRAVENOUS
  Administered 2022-05-09: 10 mg via INTRAVENOUS

## 2022-05-09 MED ORDER — EPINEPHRINE PF 1 MG/ML IJ SOLN
INTRAMUSCULAR | Status: AC
  Filled 2022-05-09: qty 1

## 2022-05-09 MED ORDER — OXYCODONE HCL 5 MG PO TABS
5.0000 mg | ORAL_TABLET | Freq: Once | ORAL | Status: AC | PRN
  Administered 2022-05-09: 5 mg via ORAL

## 2022-05-09 MED ORDER — PROPOFOL 10 MG/ML IV BOLUS
INTRAVENOUS | Status: AC
  Filled 2022-05-09: qty 20

## 2022-05-09 MED ORDER — INSULIN ASPART 100 UNIT/ML IJ SOLN
INTRAMUSCULAR | Status: AC
  Administered 2022-05-09: 5 [IU]
  Filled 2022-05-09: qty 1

## 2022-05-09 MED ORDER — INSULIN REGULAR HUMAN 100 UNIT/ML IJ SOLN: 5.0000 [IU] | Freq: Once | INTRAMUSCULAR | Status: DC

## 2022-05-09 MED ORDER — EPHEDRINE SULFATE (PRESSORS) 50 MG/ML IJ SOLN
INTRAMUSCULAR | Status: DC | PRN
  Administered 2022-05-09 (×3): 5 mg via INTRAVENOUS
  Administered 2022-05-09: 10 mg via INTRAVENOUS
  Administered 2022-05-09 (×2): 5 mg via INTRAVENOUS

## 2022-05-09 MED ORDER — CHLORHEXIDINE GLUCONATE 0.12 % MT SOLN: 15.0000 mL | Freq: Once | OROMUCOSAL | Status: AC

## 2022-05-09 MED ORDER — STERILE WATER FOR IRRIGATION IR SOLN
Status: DC | PRN
  Administered 2022-05-09: 100 mL

## 2022-05-09 MED ORDER — INSULIN ASPART 100 UNIT/ML IJ SOLN
5.0000 [IU] | Freq: Once | INTRAMUSCULAR | Status: AC
  Administered 2022-05-09: 5 [IU] via SUBCUTANEOUS

## 2022-05-09 MED ORDER — ALBUTEROL SULFATE HFA 108 (90 BASE) MCG/ACT IN AERS
INHALATION_SPRAY | RESPIRATORY_TRACT | Status: DC | PRN
  Administered 2022-05-09: 4 via RESPIRATORY_TRACT

## 2022-05-09 MED ORDER — MIDAZOLAM HCL 2 MG/2ML IJ SOLN
INTRAMUSCULAR | Status: AC
  Filled 2022-05-09: qty 2

## 2022-05-09 MED ORDER — CEFAZOLIN SODIUM-DEXTROSE 2-4 GM/100ML-% IV SOLN
INTRAVENOUS | Status: AC
  Filled 2022-05-09: qty 100

## 2022-05-09 MED ORDER — BUPIVACAINE LIPOSOME 1.3 % IJ SUSP: 20.0000 mL | Freq: Once | INTRAMUSCULAR | Status: DC

## 2022-05-09 MED ORDER — CHLORHEXIDINE GLUCONATE 0.12 % MT SOLN
OROMUCOSAL | Status: AC
  Administered 2022-05-09: 15 mL via OROMUCOSAL
  Filled 2022-05-09: qty 15

## 2022-05-09 MED ORDER — PHENYLEPHRINE HCL (PRESSORS) 10 MG/ML IV SOLN
INTRAVENOUS | Status: DC | PRN
  Administered 2022-05-09: 80 ug via INTRAVENOUS
  Administered 2022-05-09: 160 ug via INTRAVENOUS
  Administered 2022-05-09: 80 ug via INTRAVENOUS
  Administered 2022-05-09: 160 ug via INTRAVENOUS
  Administered 2022-05-09: 80 ug via INTRAVENOUS

## 2022-05-09 MED ORDER — ONDANSETRON HCL 4 MG/2ML IJ SOLN
INTRAMUSCULAR | Status: AC
  Filled 2022-05-09: qty 2

## 2022-05-09 MED ORDER — LIDOCAINE HCL (CARDIAC) PF 100 MG/5ML IV SOSY
PREFILLED_SYRINGE | INTRAVENOUS | Status: DC | PRN
  Administered 2022-05-09: 3 mL via INTRAVENOUS

## 2022-05-09 MED ORDER — MIDAZOLAM HCL 2 MG/2ML IJ SOLN
INTRAMUSCULAR | Status: DC | PRN
  Administered 2022-05-09: 1 mg via INTRAVENOUS

## 2022-05-09 MED ORDER — SODIUM CHLORIDE 0.9 % IV SOLN: INTRAVENOUS | Status: DC

## 2022-05-09 MED ORDER — LACTATED RINGERS IV SOLN: INTRAVENOUS | Status: DC

## 2022-05-09 MED ORDER — FENTANYL CITRATE (PF) 100 MCG/2ML IJ SOLN
INTRAMUSCULAR | Status: AC
  Filled 2022-05-09: qty 2

## 2022-05-09 MED ORDER — PROPOFOL 10 MG/ML IV BOLUS
INTRAVENOUS | Status: DC | PRN
  Administered 2022-05-09: 170 mg via INTRAVENOUS
  Administered 2022-05-09: 30 mg via INTRAVENOUS

## 2022-05-09 MED ORDER — SCOPOLAMINE 1 MG/3DAYS TD PT72
MEDICATED_PATCH | TRANSDERMAL | Status: AC
  Administered 2022-05-09: 1.5 mg via TRANSDERMAL
  Filled 2022-05-09: qty 1

## 2022-05-09 MED ORDER — IPRATROPIUM-ALBUTEROL 0.5-2.5 (3) MG/3ML IN SOLN
RESPIRATORY_TRACT | Status: AC
  Administered 2022-05-09: 3 mL via RESPIRATORY_TRACT
  Filled 2022-05-09: qty 3

## 2022-05-09 MED ORDER — CEFAZOLIN SODIUM-DEXTROSE 2-3 GM-%(50ML) IV SOLR
INTRAVENOUS | Status: DC | PRN
  Administered 2022-05-09: 2 g via INTRAVENOUS

## 2022-05-09 MED ORDER — ACETAMINOPHEN 500 MG PO TABS
ORAL_TABLET | ORAL | Status: AC
  Administered 2022-05-09: 1000 mg via ORAL
  Filled 2022-05-09: qty 2

## 2022-05-09 MED ORDER — PHENYLEPHRINE 80 MCG/ML (10ML) SYRINGE FOR IV PUSH (FOR BLOOD PRESSURE SUPPORT)
PREFILLED_SYRINGE | INTRAVENOUS | Status: DC | PRN
  Administered 2022-05-09 (×2): 160 ug via INTRAVENOUS

## 2022-05-09 MED ORDER — LIDOCAINE HCL (PF) 2 % IJ SOLN
INTRAMUSCULAR | Status: AC
  Filled 2022-05-09: qty 5

## 2022-05-09 MED ORDER — CEFAZOLIN SODIUM-DEXTROSE 2-4 GM/100ML-% IV SOLN: 2.0000 g | INTRAVENOUS | Status: DC

## 2022-05-09 MED ORDER — ORAL CARE MOUTH RINSE: 15.0000 mL | Freq: Once | OROMUCOSAL | Status: AC

## 2022-05-09 MED ORDER — FENTANYL CITRATE (PF) 100 MCG/2ML IJ SOLN
INTRAMUSCULAR | Status: DC | PRN
  Administered 2022-05-09 (×2): 50 ug via INTRAVENOUS

## 2022-05-09 MED ORDER — ROCURONIUM BROMIDE 10 MG/ML (PF) SYRINGE
PREFILLED_SYRINGE | INTRAVENOUS | Status: AC
  Filled 2022-05-09: qty 10

## 2022-05-09 MED ORDER — DEXAMETHASONE SODIUM PHOSPHATE 10 MG/ML IJ SOLN
INTRAMUSCULAR | Status: AC
  Filled 2022-05-09: qty 1

## 2022-05-09 MED ORDER — ONDANSETRON HCL 4 MG/2ML IJ SOLN: 4.0000 mg | Freq: Once | INTRAMUSCULAR | Status: DC | PRN

## 2022-05-09 MED ORDER — ACETAMINOPHEN 10 MG/ML IV SOLN: 1000.0000 mg | Freq: Once | INTRAVENOUS | Status: DC | PRN

## 2022-05-09 MED ORDER — ETOMIDATE 2 MG/ML IV SOLN
INTRAVENOUS | Status: AC
  Filled 2022-05-09: qty 20

## 2022-05-09 MED ORDER — CHLORHEXIDINE GLUCONATE CLOTH 2 % EX PADS: 6.0000 | MEDICATED_PAD | Freq: Once | CUTANEOUS | Status: DC

## 2022-05-09 MED ORDER — IBUPROFEN 800 MG PO TABS: 800.0000 mg | ORAL_TABLET | Freq: Three times a day (TID) | ORAL | 1 refills | Status: DC | PRN

## 2022-05-09 MED ORDER — LACTATED RINGERS IV SOLN: INTRAVENOUS | Status: DC | PRN

## 2022-05-09 MED ORDER — BUPIVACAINE-EPINEPHRINE (PF) 0.5% -1:200000 IJ SOLN
INTRAMUSCULAR | Status: DC | PRN
  Administered 2022-05-09: 40 mL

## 2022-05-09 MED ORDER — ACETAMINOPHEN 10 MG/ML IV SOLN
INTRAVENOUS | Status: AC
  Administered 2022-05-09: 1000 mg
  Filled 2022-05-09: qty 100

## 2022-05-09 MED ORDER — SUGAMMADEX SODIUM 200 MG/2ML IV SOLN
INTRAVENOUS | Status: DC | PRN
  Administered 2022-05-09: 200 mg via INTRAVENOUS

## 2022-05-09 MED ORDER — INSULIN ASPART 100 UNIT/ML IJ SOLN
INTRAMUSCULAR | Status: AC
  Filled 2022-05-09: qty 1

## 2022-05-09 MED ORDER — BUPIVACAINE LIPOSOME 1.3 % IJ SUSP
INTRAMUSCULAR | Status: AC
  Filled 2022-05-09: qty 10

## 2022-05-09 MED ORDER — OXYCODONE HCL 5 MG PO TABS
ORAL_TABLET | ORAL | Status: AC
  Filled 2022-05-09: qty 1

## 2022-05-09 MED ORDER — FENTANYL CITRATE (PF) 100 MCG/2ML IJ SOLN: 25.0000 ug | INTRAMUSCULAR | Status: DC | PRN

## 2022-05-09 MED ORDER — OXYCODONE HCL 5 MG PO TABS: 5.0000 mg | ORAL_TABLET | ORAL | 0 refills | Status: DC | PRN

## 2022-05-09 MED ORDER — OXYCODONE HCL 5 MG/5ML PO SOLN: 5.0000 mg | Freq: Once | ORAL | Status: AC | PRN

## 2022-05-09 MED ORDER — ACETAMINOPHEN 500 MG PO TABS: 1000.0000 mg | ORAL_TABLET | Freq: Four times a day (QID) | ORAL | Status: DC | PRN

## 2022-05-09 MED ORDER — SCOPOLAMINE 1 MG/3DAYS TD PT72: 1.0000 | MEDICATED_PATCH | TRANSDERMAL | Status: DC

## 2022-05-09 MED ORDER — BUPIVACAINE HCL (PF) 0.5 % IJ SOLN
INTRAMUSCULAR | Status: AC
  Filled 2022-05-09: qty 30

## 2022-05-09 MED ORDER — CHLORHEXIDINE GLUCONATE 0.12 % MT SOLN
OROMUCOSAL | Status: AC
  Filled 2022-05-09: qty 15

## 2022-05-09 MED ORDER — IPRATROPIUM-ALBUTEROL 0.5-2.5 (3) MG/3ML IN SOLN
RESPIRATORY_TRACT | Status: AC
  Filled 2022-05-09: qty 3

## 2022-05-09 SURGICAL SUPPLY — 55 items
ADH SKN CLS APL DERMABOND .7 (GAUZE/BANDAGES/DRESSINGS) ×1
APL PRP STRL LF DISP 70% ISPRP (MISCELLANEOUS) ×1
APPLIER CLIP 9.375 SM OPEN (CLIP)
APR CLP SM 9.3 20 MLT OPN (CLIP)
BINDER BREAST LRG (GAUZE/BANDAGES/DRESSINGS) IMPLANT
BINDER BREAST MEDIUM (GAUZE/BANDAGES/DRESSINGS) IMPLANT
BINDER BREAST XLRG (GAUZE/BANDAGES/DRESSINGS) IMPLANT
BINDER BREAST XXLRG (GAUZE/BANDAGES/DRESSINGS) IMPLANT
BLADE PHOTON ILLUMINATED (MISCELLANEOUS) ×1 IMPLANT
BLADE SURG 15 STRL LF DISP TIS (BLADE) ×2 IMPLANT
BLADE SURG 15 STRL SS (BLADE) ×2
CHLORAPREP W/TINT 26 (MISCELLANEOUS) ×1 IMPLANT
CLIP APPLIE 9.375 SM OPEN (CLIP) IMPLANT
CNTNR URN SCR LID CUP LEK RST (MISCELLANEOUS) IMPLANT
CONT SPEC 4OZ STRL OR WHT (MISCELLANEOUS)
DERMABOND ADVANCED .7 DNX12 (GAUZE/BANDAGES/DRESSINGS) ×1 IMPLANT
DEVICE DUBIN SPECIMEN MAMMOGRA (MISCELLANEOUS) ×1 IMPLANT
DRAPE LAPAROTOMY 100X77 ABD (DRAPES) ×1 IMPLANT
DRSG GAUZE FLUFF 36X18 (GAUZE/BANDAGES/DRESSINGS) ×1 IMPLANT
ELECT REM PT RETURN 9FT ADLT (ELECTROSURGICAL) ×1
ELECTRODE REM PT RTRN 9FT ADLT (ELECTROSURGICAL) ×1 IMPLANT
GAUZE 4X4 16PLY ~~LOC~~+RFID DBL (SPONGE) ×1 IMPLANT
GLOVE SURG SYN 7.0 (GLOVE) ×1 IMPLANT
GLOVE SURG SYN 7.0 PF PI (GLOVE) ×1 IMPLANT
GLOVE SURG SYN 7.5  E (GLOVE) ×1
GLOVE SURG SYN 7.5 E (GLOVE) ×1 IMPLANT
GLOVE SURG SYN 7.5 PF PI (GLOVE) ×1 IMPLANT
GOWN STRL REUS W/ TWL LRG LVL3 (GOWN DISPOSABLE) ×2 IMPLANT
GOWN STRL REUS W/TWL LRG LVL3 (GOWN DISPOSABLE) ×2
HANDLE YANKAUER SUCT BULB TIP (MISCELLANEOUS) IMPLANT
KIT MARKER MARGIN INK (KITS) IMPLANT
KIT TURNOVER KIT A (KITS) ×1 IMPLANT
LABEL OR SOLS (LABEL) ×1 IMPLANT
MANIFOLD NEPTUNE II (INSTRUMENTS) ×1 IMPLANT
MARGIN MAP 10MM (MISCELLANEOUS) IMPLANT
MARKER MARGIN CORRECT CLIP (MARKER) IMPLANT
NEEDLE HYPO 22GX1.5 SAFETY (NEEDLE) ×1 IMPLANT
PACK BASIN MINOR ARMC (MISCELLANEOUS) ×1 IMPLANT
SET LOCALIZER 20 PROBE US (MISCELLANEOUS) ×1 IMPLANT
SUT ETHILON 3-0 FS-10 30 BLK (SUTURE)
SUT MNCRL 4-0 (SUTURE) ×1
SUT MNCRL 4-0 27XMFL (SUTURE) ×1
SUT SILK 3 0 SH 30 (SUTURE) ×1 IMPLANT
SUT VIC AB 2-0 SH 27 (SUTURE) ×1
SUT VIC AB 2-0 SH 27XBRD (SUTURE) IMPLANT
SUT VIC AB 3-0 SH 27 (SUTURE) ×1
SUT VIC AB 3-0 SH 27X BRD (SUTURE) ×1 IMPLANT
SUTURE EHLN 3-0 FS-10 30 BLK (SUTURE) IMPLANT
SUTURE MNCRL 4-0 27XMF (SUTURE) ×1 IMPLANT
SYR 10ML LL (SYRINGE) ×1 IMPLANT
TAPE TRANSPORE STRL 2 31045 (GAUZE/BANDAGES/DRESSINGS) IMPLANT
TRAP FLUID SMOKE EVACUATOR (MISCELLANEOUS) ×1 IMPLANT
TRAP NEPTUNE SPECIMEN COLLECT (MISCELLANEOUS) ×1 IMPLANT
WATER STERILE IRR 1000ML POUR (IV SOLUTION) ×1 IMPLANT
WATER STERILE IRR 500ML POUR (IV SOLUTION) ×1 IMPLANT

## 2022-05-09 NOTE — Progress Notes (Signed)
At [place] patient sat forward, stated she needed a cough drop, proceded to turn red in color and have a laryngpspasm, Dr Erenest Rasher at bedside immediately , 2 DUO nebs given , also blood sugar remains elevatedand insulin given . Patient responded well to the neb treatments and is breathing comfortably at this time

## 2022-05-09 NOTE — Discharge Instructions (Addendum)
Discharge Instructions: 1.  Patient may shower, but do not scrub wounds heavily and dab dry only. 2.  Do not submerge wounds in pool/tub until fully healed. 3.  Do not apply ointments or hydrogen peroxide to the wounds. 4.  May apply ice packs to the wounds for comfort. 5.  Please wear breast binder at all times for the next two weeks.  May apply fluffed gauze over incision to help with comfort/padding.  If the binder is too tight or cannot tolerate having it on, then please change to a sports bra that is a bit tighter to allow for some compression.  May remove binder for showers. 6.  Do not drive while taking narcotics for pain control.  Prior to driving, make sure you are able to rotate right and left to look at blindspots without significant pain or discomfort. 7.  Avoid strenuous activity with the right arm for two weeks.  AMBULATORY SURGERY  DISCHARGE INSTRUCTIONS   The drugs that you were given will stay in your system until tomorrow so for the next 24 hours you should not:  Drive an automobile Make any legal decisions Drink any alcoholic beverage   You may resume regular meals tomorrow.  Today it is better to start with liquids and gradually work up to solid foods.  You may eat anything you prefer, but it is better to start with liquids, then soup and crackers, and gradually work up to solid foods.   Please notify your doctor immediately if you have any unusual bleeding, trouble breathing, redness and pain at the surgery site, drainage, fever, or pain not relieved by medication.    Additional Instructions: PLEASE LEAVE GREEN/TEAL ARMBAND ON FOR 4 DAYS    Please contact your physician with any problems or Same Day Surgery at [phone], Monday through Friday 6 am to 4 pm, or [place] [facility] number at [phone].

## 2022-05-09 NOTE — Anesthesia Preprocedure Evaluation (Addendum)
Anesthesia Evaluation  Patient identified by MRN, date of birth, ID band Patient awake    Reviewed: Allergy & Precautions, NPO status , Patient's Chart, lab work & pertinent test results  History of Anesthesia Complications (+) PONV and history of anesthetic complications  Airway Mallampati: IV   Neck ROM: Full    Dental  (+) Missing   Pulmonary asthma , sleep apnea and Continuous Positive Airway Pressure Ventilation , COPD (on 2.5L home O2 at night),  oxygen dependent, former smoker   Pulmonary exam normal breath sounds clear to auscultation       Cardiovascular hypertension, + Peripheral Vascular Disease and +CHF (diastolic)  Normal cardiovascular exam Rhythm:Regular Rate:Normal  ECG 04/21/22: sinus rhythm, RBBB, left anterior fascicular block  Echo 03/2021: EF 60 to 123456, grade 2 diastolic dysfunction  Myoview 05/2019: no evidence for ischemia   Neuro/Psych  Headaches PSYCHIATRIC DISORDERS  Depression    Vertigo   Neuromuscular disease (neuropathy)    GI/Hepatic ,GERD  ,,  Endo/Other  diabetes, Type 2Hypothyroidism  Class 3 obesity  Renal/GU negative Renal ROS     Musculoskeletal   Abdominal   Peds  Hematology  (+) Blood dyscrasia, anemia   Anesthesia Other Findings Last Mounjaro dose greater than 7 days ago.    Reviewed and agree with Bayard Males pre-anesthesia clinical review note.    Cardiology note 04/21/22:  1. Preop evaluation, right breast lumpectomy being planned.  Denies chest pain, echo 03/2021 with normal EF 60 to 65%.  Lumpectomy deemed low risk from a cardiac perspective.  Okay to proceed with procedure, no Additional cardiac testing or intervention needed. 2. HFpEF, appears euvolemic, continue torsemide. 3. Morbid obesity, low-calorie diet, weight loss advised.  Continue Mounjaro. 4. hypertension, BP previously running low.  Improved with stopping Entresto and Aldactone.   Follow-up in 6 months.    Reproductive/Obstetrics                             Anesthesia Physical Anesthesia Plan  ASA: 3  Anesthesia Plan: General   Post-op Pain Management:    Induction: Intravenous  PONV Risk Score and Plan: 4 or greater and Ondansetron, Dexamethasone and Treatment may vary due to age or medical condition  Airway Management Planned: Oral ETT  Additional Equipment:   Intra-op Plan:   Post-operative Plan: Extubation in OR  Informed Consent: I have reviewed the patients History and Physical, chart, labs and discussed the procedure including the risks, benefits and alternatives for the proposed anesthesia with the patient or authorized representative who has indicated his/her understanding and acceptance.     Dental advisory given  Plan Discussed with: CRNA  Anesthesia Plan Comments: (Patient consented for risks of anesthesia including but not limited to:  - adverse reactions to medications - damage to eyes, teeth, lips or other oral mucosa - nerve damage due to positioning  - sore throat or hoarseness - damage to heart, brain, nerves, lungs, other parts of body or loss of life  Informed patient about role of CRNA in peri- and intra-operative care.  Patient voiced understanding.)       Anesthesia Quick Evaluation

## 2022-05-09 NOTE — Interval H&P Note (Signed)
History and Physical Interval Note:  05/09/2022 2:19 PM  [name] [name]  has presented today for surgery, with the diagnosis of right breast intraductal pipilloma.  The various methods of treatment have been discussed with the patient and family. After consideration of risks, benefits and other options for treatment, the patient has consented to  Procedure(s): BREAST LUMPECTOMY WITH RADIOFREQUENCY TAG IDENTIFICATION (Right) as a surgical intervention.  The patient's history has been reviewed, patient examined, no change in status, stable for surgery.  I have reviewed the patient's chart and labs.  Questions were answered to the patient's satisfaction.     Amonie Wisser

## 2022-05-09 NOTE — Transfer of Care (Signed)
Immediate Anesthesia Transfer of Care Note  Patient: [name] [name]  Procedure(s) Performed: BREAST LUMPECTOMY WITH RADIOFREQUENCY TAG IDENTIFICATION (Right)  Patient Location: PACU  Anesthesia Type:General  Level of Consciousness: awake  Airway & Oxygen Therapy: Patient Spontanous Breathing and Patient connected to face mask oxygen  Post-op Assessment: Report given to RN and Post -op Vital signs reviewed and stable  Post vital signs: Reviewed  Last Vitals:  Vitals Value Taken Time  BP 131/46 05/09/22 1630  Temp 58F   Pulse 93 05/09/22 1633  Resp 19 05/09/22 1633  SpO2 95 % 05/09/22 1633  Vitals shown include unvalidated device data.  Last Pain:  Vitals:   05/09/22 1142  PainSc: 0-No pain         Complications: No notable events documented.

## 2022-05-09 NOTE — Op Note (Signed)
  Procedure Date:  05/09/2022  Pre-operative Diagnosis:  Right breast ADH and PASH  Post-operative Diagnosis: Right breast ADH and PASH  Procedure:  Right breast RF tag-localized lumpectomy -- two tags removed.  Surgeon:  Melvyn Neth, MD  Anesthesia:  General endotracheal  Estimated Blood Loss:  10 ml  Specimens:  Right breast ADH and PASH  Complications:  None  Indications for Procedure:  This is a 67 y.o. 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  She had two separate areas biopsied in the right breast and both will be removed.  The risks of bleeding, infection, injury to surrounding structures, hematoma, seroma, open wound, cosmetic deformity, and the need for further surgery were all discussed with the patient and was willing to proceed.  Prior to this procedure, the patient had undergone RF tag localization of both sites.  Description of Procedure: The patient was correctly identified in the preoperative area and brought into the operating room.  The patient was placed supine with VTE prophylaxis in place.  Appropriate time-outs were performed.  Anesthesia was induced and the patient was intubated.  Appropriate antibiotics were infused.  The right chest was prepped and draped in usual sterile fashion.  The RF tag localization sites were determined using the Hologic probe to be in the outer lower quadrant, close to each other.  A  6 cm incision was made overlying the tags and clips.  Hologic probe was used to guide our dissection using electrocautery, and a partial mastectomy was performed with adequate margins.  MarginMarker was used to ink each of the sides of the specimen.  The specimen was then imaged to confirm that the areas of concern, biopsy clips, and RF tags were included in the excision.  This was then sent to pathology.  The cavity was irrigated and hemostasis was assured with electrocautery.  Local anesthetic was  infiltrated into the skin and subcutaneous tissue of the cavity.  The wound was then closed in three layers with 2-0 Vicryl, 3-0 Vicryl and 4-0 Monocryl and sealed with DermaBond.  Breast binder was placed.  The patient was emerged from anesthesia and extubated and brought to the recovery room for further management.  The patient tolerated the procedure well and all counts were correct at the end of the case.   Melvyn Neth, MD

## 2022-05-09 NOTE — Anesthesia Procedure Notes (Signed)
Procedure Name: Intubation Date/Time: 05/09/2022 2:50 PM  Performed by: Otho Perl, CRNAPre-anesthesia Checklist: Patient identified, Patient being monitored, Timeout performed, Emergency Drugs available and Suction available Patient Re-evaluated:Patient Re-evaluated prior to induction Oxygen Delivery Method: Circle system utilized Preoxygenation: Pre-oxygenation with 100% oxygen Induction Type: IV induction Ventilation: Mask ventilation without difficulty Laryngoscope Size: 3 and McGraph Grade View: Grade II Tube type: Oral Tube size: 7.0 mm Number of attempts: 1 Airway Equipment and Method: Stylet Placement Confirmation: ETT inserted through vocal cords under direct vision, positive ETCO2 and breath sounds checked- equal and bilateral Secured at: 21 cm Tube secured with: Tape Dental Injury: Teeth and Oropharynx as per pre-operative assessment

## 2022-05-09 NOTE — Anesthesia Postprocedure Evaluation (Signed)
Anesthesia Post Note  Patient: [name] [name]  Procedure(s) Performed: BREAST LUMPECTOMY WITH RADIOFREQUENCY TAG IDENTIFICATION (Right)  Patient location during evaluation: PACU Anesthesia Type: General Level of consciousness: awake and alert, oriented and patient cooperative Pain management: pain level controlled Vital Signs Assessment: post-procedure vital signs reviewed and stable Respiratory status: spontaneous breathing, nonlabored ventilation and respiratory function stable Cardiovascular status: blood pressure returned to baseline and stable Postop Assessment: adequate PO intake Anesthetic complications: yes   Encounter Notable Events  Notable Event Outcome Phase Comment  Laryngospasm Resolved in Lab Postprocedure, before discharge Partial laryngospasm in PACU, resolved with high flow supplemental O2     Last Vitals:  Vitals:   05/09/22 1632 05/09/22 1645  BP:    Pulse:    Resp:    Temp: (!) 35.9 C (!) 35.9 C  SpO2:      Last Pain:  Vitals:   05/09/22 1632  PainSc: 0-No pain                 Darrin Nipper

## 2022-05-10 ENCOUNTER — Ambulatory Visit: Payer: 59

## 2022-05-10 ENCOUNTER — Encounter: Payer: Self-pay | Admitting: Surgery

## 2022-05-12 ENCOUNTER — Other Ambulatory Visit: Payer: Self-pay | Admitting: Anatomic Pathology & Clinical Pathology

## 2022-05-12 NOTE — Progress Notes (Signed)
05/12/22  Called patient today to discuss pathology results.  Her right breast lumpectomy had been for intraductal papilloma with ADH and a separate area of [place].  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  All margins were negative.  Discussed with her the recommendation for radiation therapy and endocrine therapy.  Referrals to medical oncology and radiation oncology will be placed.  She has an appointment with me on 3/6 for post-op.  Olean Ree, MD

## 2022-05-15 ENCOUNTER — Ambulatory Visit: Payer: Medicare Other

## 2022-05-15 ENCOUNTER — Encounter: Payer: Self-pay | Admitting: *Deleted

## 2022-05-15 ENCOUNTER — Other Ambulatory Visit: Payer: Self-pay

## 2022-05-15 DIAGNOSIS — D0511 Intraductal carcinoma in situ of right breast: Secondary | ICD-10-CM

## 2022-05-15 LAB — SURGICAL PATHOLOGY

## 2022-05-15 NOTE — Progress Notes (Signed)
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 Hampton Abbot.   Ms. Garrick will see Dr. Grayland Ormond and Dr. Baruch Gouty on March 7 at 10:00.   Appt. Details given to her.

## 2022-05-17 ENCOUNTER — Ambulatory Visit (INDEPENDENT_AMBULATORY_CARE_PROVIDER_SITE_OTHER): Payer: 59 | Admitting: Dermatology

## 2022-05-17 DIAGNOSIS — L299 Pruritus, unspecified: Secondary | ICD-10-CM

## 2022-05-17 DIAGNOSIS — R21 Rash and other nonspecific skin eruption: Secondary | ICD-10-CM | POA: Diagnosis not present

## 2022-05-17 DIAGNOSIS — L219 Seborrheic dermatitis, unspecified: Secondary | ICD-10-CM

## 2022-05-17 MED ORDER — CLOBETASOL PROPIONATE 0.05 % EX FOAM: Freq: Two times a day (BID) | CUTANEOUS | 0 refills | Status: DC

## 2022-05-17 NOTE — Patient Instructions (Signed)
Recommend OTC Gold Bond Rapid Relief Anti-Itch cream (pramoxine + menthol), CeraVe Anti-itch cream or lotion (pramoxine), Sarna lotion (Original- menthol + camphor or Sensitive- pramoxine) or Eucerin 12 hour Itch Relief lotion (menthol) up to 3 times per day to areas on body that are itchy.  Continue clobetasol  0.05% twice daily to itchy areas at scalp. Avoid applying to face, groin, and axilla. Use as directed. Long-term use can cause thinning of the skin.  Topical steroids (such as triamcinolone, fluocinolone, fluocinonide, mometasone, clobetasol, halobetasol, betamethasone, hydrocortisone) can cause thinning and lightening of the skin if they are used for too long in the same area. Your physician has selected the right strength medicine for your problem and area affected on the body. Please use your medication only as directed by your physician to prevent side effects.   Due to recent changes in healthcare laws, you may see results of your pathology and/or laboratory studies on MyChart before the doctors have had a chance to review them. We understand that in some cases there may be results that are confusing or concerning to you. Please understand that not all results are received at the same time and often the doctors may need to interpret multiple results in order to provide you with the best plan of care or course of treatment. Therefore, we ask that you please give [place] 2 business days to thoroughly review all your results before contacting the office for clarification. Should we see a critical lab result, you will be contacted sooner.   If You Need Anything After Your Visit  If you have any questions or concerns for your doctor, please call our main line at [phone] and press option 4 to reach your doctor's medical assistant. If no one answers, please leave a voicemail as directed and we will return your call as soon as possible. Messages left after 4 pm will be answered the following business  day.   You may also send [place] a message via [place]. We typically respond to MyChart messages within 1-2 business days.  For prescription refills, please ask your pharmacy to contact our office. Our fax number is [phone].  If you have an urgent issue when the clinic is closed that cannot wait until the next business day, you can page your doctor at the number below.    Please note that while we do our best to be available for urgent issues outside of office hours, we are not available 24/7.   If you have an urgent issue and are unable to reach [place], you may choose to seek medical care at your doctor's office, retail clinic, urgent care center, or emergency room.  If you have a medical emergency, please immediately call 911 or go to the emergency department.  Pager Numbers  - Dr. Nehemiah Massed: [phone]  - Dr. Laurence Ferrari: [phone]  - Dr. Nicole Kindred: [phone]  In the event of inclement weather, please call our main line at [phone] for an update on the status of any delays or closures.  Dermatology Medication Tips: Please keep the boxes that topical medications come in in order to help keep track of the instructions about where and how to use these. Pharmacies typically print the medication instructions only on the boxes and not directly on the medication tubes.   If your medication is too expensive, please contact our office at [phone] option 4 or send [place] a message through [place].   We are unable to tell what your co-pay for medications will be in  advance as this is different depending on your insurance coverage. However, we may be able to find a substitute medication at lower cost or fill out paperwork to get insurance to cover a needed medication.   If a prior authorization is required to get your medication covered by your insurance company, please allow [place] 1-2 business days to complete this process.  Drug prices often vary depending on where the prescription is filled and  some pharmacies may offer cheaper prices.  The website www.goodrx.com contains coupons for medications through different pharmacies. The prices here do not account for what the cost may be with help from insurance (it may be cheaper with your insurance), but the website can give you the price if you did not use any insurance.  - You can print the associated coupon and take it with your prescription to the pharmacy.  - You may also stop by our office during regular business hours and pick up a GoodRx coupon card.  - If you need your prescription sent electronically to a different pharmacy, notify our office through [facility] or by phone at [phone] option 4.     Si Usted Necesita Algo Despus de Su Visita  Tambin puede enviarnos un mensaje a travs de Pharmacist, community. Por lo general respondemos a los mensajes de MyChart en el transcurso de 1 a 2 das hbiles.  Para renovar recetas, por favor pida a su farmacia que se ponga en contacto con nuestra oficina. Harland Dingwall de fax es [place] [phone].  Si tiene un asunto urgente cuando la clnica est cerrada y que no puede esperar hasta el siguiente da hbil, puede llamar/localizar a su doctor(a) al nmero que aparece a continuacin.   Por favor, tenga en cuenta que aunque hacemos todo lo posible para estar disponibles para asuntos urgentes fuera del horario de [place], no estamos disponibles las 24 horas del da, los 7 das de la [place].   Si tiene un problema urgente y no puede comunicarse con nosotros, puede optar por buscar atencin mdica  en el consultorio de su doctor(a), en una clnica privada, en un centro de atencin urgente o en una sala de emergencias.  Si tiene Engineering geologist, por favor llame inmediatamente al 911 o vaya a la sala de emergencias.  Nmeros de bper  - Dr. Nehemiah Massed: [phone]  - Dra. Moye: [phone]  - Dra. Nicole Kindred: [phone]  En caso de inclemencias del [place], por favor llame a Johnsie Kindred principal al [phone] para una actualizacin sobre el [place] de cualquier retraso o cierre.  Consejos para la medicacin en dermatologa: Por favor, guarde las cajas en las que vienen los medicamentos de uso tpico para ayudarle a seguir las instrucciones sobre dnde y cmo usarlos. Las farmacias generalmente imprimen las instrucciones del medicamento slo en las cajas y no directamente en los tubos del [place].   Si su medicamento es muy caro, por favor, pngase en contacto con Zigmund Daniel llamando al [phone] y presione la opcin 4 o envenos un mensaje a travs de Pharmacist, community.   No podemos decirle cul ser su copago por los medicamentos por adelantado ya que esto es diferente dependiendo de la cobertura de su seguro. Sin embargo, es posible que podamos encontrar un medicamento sustituto a Electrical engineer un formulario para que el seguro cubra el medicamento que se considera necesario.   Si se requiere una autorizacin previa para que su compaa de seguros [place] su medicamento, por favor permtanos de 1 a 2  das hbiles para completar [place].  Los precios de los medicamentos varan con frecuencia dependiendo del Environmental consultant de dnde se surte la receta y alguna farmacias pueden ofrecer precios ms baratos.  El sitio web www.goodrx.com tiene cupones para medicamentos de Airline pilot. Los precios aqu no tienen en cuenta lo que podra costar con la ayuda del seguro (puede ser ms barato con su seguro), pero el sitio web puede darle el precio si no utiliz Research scientist (physical sciences).  - Puede imprimir el cupn correspondiente y llevarlo con su receta a la farmacia.  - Tambin puede pasar por nuestra oficina durante el horario de atencin regular y Charity fundraiser una tarjeta de cupones de GoodRx.  - Si necesita que su receta se enve electrnicamente a una farmacia diferente, informe a nuestra oficina a travs de MyChart de [place] o por telfono llamando al [phone] y presione la  opcin 4.

## 2022-05-17 NOTE — Progress Notes (Signed)
   Follow-Up Visit   Subjective  [name] [name] is a 67 y.o. female who presents for the following: Psoriasis Marland KitchenChinita [place] [facility]. Patient with itching at scalp, face, ears. She is using clobetasol foam and ketoconazole shampoo. Patient did see rheumatology and is scheduled for follow up 06/26/22. ) and Follow-up (AK's treated at chest with LN2. ).    The following portions of the chart were reviewed this encounter and updated as appropriate:       Review of Systems:  No other skin or systemic complaints except as noted in HPI or Assessment and Plan.  Objective  Well appearing patient in no apparent distress; mood and affect are within normal limits.  A focused examination was performed including scalp, arms and hands, chest. Relevant physical exam findings are noted in the Assessment and Plan.  Scalp Scale and erythema at scalp  No rash at hands, upper back, chest. Pt denies rash at hips.  Arms, legs 3/5 strength    Assessment & Plan  Rash Scalp  Recommend OTC Gold Bond Rapid Relief Anti-Itch cream (pramoxine + menthol), CeraVe Anti-itch cream or lotion (pramoxine), Sarna lotion (Original- menthol + camphor or Sensitive- pramoxine) or Eucerin 12 hour Itch Relief lotion (menthol) up to 3 times per day to areas on body that are itchy.  Continue clobetasol  0.05% twice daily to itchy areas at scalp. Avoid applying to face, groin, and axilla. Use as directed. Long-term use can cause thinning of the skin.  Topical steroids (such as triamcinolone, fluocinolone, fluocinonide, mometasone, clobetasol, halobetasol, betamethasone, hydrocortisone) can cause thinning and lightening of the skin if they are used for too long in the same area. Your physician has selected the right strength medicine for your problem and area affected on the body. Please use your medication only as directed by your physician to prevent side effects.    Skin / nail biopsy - Scalp Type of biopsy: punch   Informed consent:  discussed and consent obtained   Timeout: patient name, date of birth, surgical site, and procedure verified   Procedure prep:  Patient was prepped and draped in usual sterile fashion Prep type:  Isopropyl alcohol Anesthesia: the lesion was anesthetized in a standard fashion   Anesthetic:  1% lidocaine w/ epinephrine 1-100,000 buffered w/ 8.4% NaHCO3 Punch size:  4 mm Suture size:  3-0 Suture type: nylon   Suture removal (days):  14 Hemostasis achieved with: suture   Outcome: patient tolerated procedure well   Post-procedure details: wound care instructions given   Additional details:  Petrolatum and a pressure dressing were applied  clobetasol (OLUX) 0.05 % topical foam - Scalp Apply topically 2 (two) times daily.  Specimen 1 - Surgical pathology Differential Diagnosis: r/o Dermatomyositis vs Seb Derm vs Psoriasis  Check Margins: No Scale and erythema at scalp  Itch Scalp   Return in about 2 weeks (around 05/31/2022) for Suture Removal.  Graciella Belton, RMA, am acting as scribe for Forest Gleason, MD .

## 2022-05-18 ENCOUNTER — Ambulatory Visit: Payer: Medicare Other

## 2022-05-22 ENCOUNTER — Ambulatory Visit: Payer: 59

## 2022-05-22 ENCOUNTER — Encounter: Payer: Self-pay | Admitting: Dermatology

## 2022-05-22 NOTE — Progress Notes (Signed)
error 

## 2022-05-24 ENCOUNTER — Ambulatory Visit: Payer: 59

## 2022-05-24 ENCOUNTER — Other Ambulatory Visit: Payer: Self-pay

## 2022-05-24 ENCOUNTER — Encounter: Payer: Self-pay | Admitting: Surgery

## 2022-05-24 ENCOUNTER — Ambulatory Visit (INDEPENDENT_AMBULATORY_CARE_PROVIDER_SITE_OTHER): Payer: 59 | Admitting: Surgery

## 2022-05-24 VITALS — BP 123/65 | HR 82 | Temp 98.0°F | Ht 64.0 in | Wt 255.0 lb

## 2022-05-24 DIAGNOSIS — D241 Benign neoplasm of right breast: Secondary | ICD-10-CM

## 2022-05-24 DIAGNOSIS — Z09 Encounter for follow-up examination after completed treatment for conditions other than malignant neoplasm: Secondary | ICD-10-CM

## 2022-05-24 DIAGNOSIS — D0511 Intraductal carcinoma in situ of right breast: Secondary | ICD-10-CM

## 2022-05-24 NOTE — Progress Notes (Signed)
05/24/2022  HPI: [name] [name] is a 67 y.o. female s/p right breast RF tag lumpectomy (two tags), on 05/09/22 for intraductal papilloma with ADH.  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  Patient presents for follow up.  She reports she's been doing well from the breast standpoint.  Has had some discomfort at the incision, but no worsening pain or swelling.  She has felt fatigued still since surgery.  In PACU, on same day of surgery, she did have an airway spasm which required nebulizer treatments, but was able to discharge home the same day.  Vital signs: BP 123/65   Pulse 82   Temp 98 F (36.7 C) (Oral)   Ht '5\' 4"'$  (1.626 m)   Wt 255 lb (115.7 kg)   SpO2 96%   BMI 43.77 kg/m    Physical Exam: Constitutional: No acute distress Breast:   Right breast s/p lumpectomy with healing scar in the outer lower quadrant.  Some palpable firmness at the scar consistent with scar tissue.  Minimal ecchymosis that's resolving.    Assessment/Plan: This is a 67 y.o. female s/p right breast lumpectomy  --Discussed with her again the new diagnosis of DCIS.  Had explained to her over the phone already but reviewed again.  Discussed that this is a non-invasive form of breast cancer, otherwise called stage 0 breast cancer.  Discussed the plan for evaluation by Oncology team for endocrine therapy and Radiation team for radiation therapy and she has appointments with Dr. Grayland Ormond and Dr. Baruch Gouty this week.   --Follow up in 6 months.   Melvyn Neth, [facility]

## 2022-05-24 NOTE — Patient Instructions (Addendum)
We will contact you in 6 months to schedule an appointment with Dr.Piscoya. If you do not hear from our office please call.         Please call with any questions or concerns.   GENERAL POST-OPERATIVE PATIENT INSTRUCTIONS   WOUND CARE INSTRUCTIONS:  Keep a dry clean dressing on the wound if there is drainage. The initial bandage may be removed after 24 hours.  Once the wound has quit draining you may leave it open to air.  If clothing rubs against the wound or causes irritation and the wound is not draining you may cover it with a dry dressing during the daytime.  Try to keep the wound dry and avoid ointments on the wound unless directed to do so.  If the wound becomes bright red and painful or starts to drain infected material that is not clear, please contact your physician immediately.  If the wound is mildly pink and has a thick firm ridge underneath it, this is normal, and is referred to as a healing ridge.  This will resolve over the next 4-6 weeks.  BATHING: You may shower if you have been informed of this by your surgeon. However, Please do not submerge in a tub, hot tub, or pool until incisions are completely sealed or have been told by your surgeon that you may do so.  DIET:  You may eat any foods that you can tolerate.  It is a good idea to eat a high fiber diet and take in plenty of fluids to prevent constipation.  If you do become constipated you may want to take a mild laxative or take ducolax tablets on a daily basis until your bowel habits are regular.  Constipation can be very uncomfortable, along with straining, after recent surgery.  ACTIVITY:  You are encouraged to cough and deep breath or use your incentive spirometer if you were given one, every 15-30 minutes when awake.  This will help prevent respiratory complications and low grade fevers post-operatively if you had a general anesthetic.  You may want to hug a pillow when coughing and sneezing to add additional support to  the surgical area, if you had abdominal or chest surgery, which will decrease pain during these times.  You are encouraged to walk and engage in light activity for the next two weeks.  You should not lift more than 20 pounds for 6 weeks total after surgery as it could put you at increased risk for complications.  Twenty pounds is roughly equivalent to a plastic bag of groceries. At that time- Listen to your body when lifting, if you have pain when lifting, stop and then try again in a few days. Soreness after doing exercises or activities of daily living is normal as you get back in to your normal routine.  MEDICATIONS:  Try to take narcotic medications and anti-inflammatory medications, such as tylenol, ibuprofen, naprosyn, etc., with food.  This will minimize stomach upset from the medication.  Should you develop nausea and vomiting from the pain medication, or develop a rash, please discontinue the medication and contact your physician.  You should not drive, make important decisions, or operate machinery when taking narcotic pain medication.  SUNBLOCK Use sun block to incision area over the next year if this area will be exposed to sun. This helps decrease scarring and will allow you avoid a permanent darkened area over your incision.  QUESTIONS:  Please feel free to call our office if you have any questions,  and we will be glad to assist you. [phone]

## 2022-05-25 ENCOUNTER — Encounter: Payer: Self-pay | Admitting: Radiation Oncology

## 2022-05-25 ENCOUNTER — Ambulatory Visit
Admission: RE | Admit: 2022-05-25 | Discharge: 2022-05-25 | Disposition: A | Discharge status: Home / Self Care | Payer: 59 | Source: Ambulatory Visit | Attending: Radiation Oncology | Admitting: Radiation Oncology

## 2022-05-25 ENCOUNTER — Inpatient Hospital Stay (HOSPITAL_BASED_OUTPATIENT_CLINIC_OR_DEPARTMENT_OTHER): Payer: 59 | Admitting: Oncology

## 2022-05-25 ENCOUNTER — Encounter: Payer: Self-pay | Admitting: Oncology

## 2022-05-25 ENCOUNTER — Encounter: Payer: Self-pay | Admitting: *Deleted

## 2022-05-25 VITALS — BP 112/54 | HR 83 | Temp 97.7°F | Resp 20 | Ht 64.0 in | Wt 255.0 lb

## 2022-05-25 DIAGNOSIS — D0511 Intraductal carcinoma in situ of right breast: Secondary | ICD-10-CM

## 2022-05-25 DIAGNOSIS — Z510 Encounter for antineoplastic radiation therapy: Secondary | ICD-10-CM | POA: Insufficient documentation

## 2022-05-25 DIAGNOSIS — Z87891 Personal history of nicotine dependence: Secondary | ICD-10-CM | POA: Insufficient documentation

## 2022-05-25 NOTE — Progress Notes (Signed)
Accompanied patient to initial medical oncology appointment.   Reviewed Breast Cancer treatment handbook.   Care plan summary given to patient.

## 2022-05-25 NOTE — Consult Note (Signed)
NEW PATIENT EVALUATION  Name: [name] [name]  MRN: [id number]  Date:   05/25/2022     DOB: 10-Nov-1955   This 67 y.o. 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  REFERRING PHYSICIAN: Denton Lank, MD  CHIEF COMPLAINT:  Chief Complaint  Patient presents with   Intraductal carcinoma of right breast    DIAGNOSIS: The encounter diagnosis was Intraductal carcinoma in situ of right breast.   PREVIOUS INVESTIGATIONS:  Pathology reports reviewed Mammogram and ultrasound reviewed Clinical notes reviewed  HPI: Patient is a 67 year old female who presents with an abnormal mammogram of her right breast.  There was a 16 mm group of calcifications in the right breast upper outer quadrant posterior depth for which stereotactic guided biopsy was performed.  Initial ultrasound-guided biopsy was positive for atypical intraductal papillary proliferation with sclerosis and calcification.  She went on to have a wide local excision showing ductal carcinoma in situ intermediate grade focally involving intraductal papilloma with associated calcifications.  Margins were clear at 5 mm.  No regional lymph nodes were submitted.  Tumor was overall grade nuclear grade 2.  She has multiple medical conditions including COPD necessitating wheelchair transportation.  She is doing well from a surgical standpoint specifically denies breast tenderness cough or bone pain.  PLANNED TREATMENT REGIMEN: Hypofractionated right whole breast radiation  PAST MEDICAL HISTORY:  has a past medical history of (HFpEF) heart failure with preserved ejection fraction ([place]), Allergic rhinitis, Allergy, Aortic atherosclerosis ([place]), Asthma, Chronic back pain, COPD (chronic obstructive pulmonary disease) ([place]), Degenerative joint disease of knee, left, Depression, Diverticulosis, Dyspnea, Edema, GERD (gastroesophageal  reflux disease), Headache, Hidradenitis, History of bilateral cataract extraction, History of colonic polyps, HLD (hyperlipidemia), Hypertension, Hypothyroidism, IBS (irritable bowel syndrome), IDA (iron deficiency anemia), Insomnia, Intraductal papilloma of breast, right, Mitral valve stenosis (04/04/2021), Obesity, OSA on CPAP, Peripheral neuropathy, Pneumonia, PONV (postoperative nausea and vomiting), PVD (peripheral vascular disease) ([place]), 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    PAST SURGICAL HISTORY:  Past Surgical History:  Procedure Laterality Date   ABDOMINAL HYSTERECTOMY     AXILLARY HIDRADENITIS EXCISION     BREAST BIOPSY Right 02/22/2022   stereo bx, calcs, "X" clip- ATYPICAL INTRADUCTAL PAPILLARY PROLIFERATION WITH SCLEROSIS AND CALCIFICATION PROLIFERATION WITH SCLEROSIS AND CALCIFICATION   BREAST BIOPSY Right 02/22/2022   MM RT BREAST BX W LOC DEV 1ST LESION IMAGE BX SPEC STEREO GUIDE 02/22/2022 [facility]   BREAST BIOPSY Right 04/04/2022   rt br calcs coil clip, PASH   BREAST BIOPSY Right 04/04/2022   MM RT BREAST BX W LOC DEV 1ST LESION IMAGE BX SPEC STEREO GUIDE 04/04/2022 [facility]   BREAST BIOPSY Right 04/26/2022   MM RT RADIO FREQUENCY TAG EA ADD LESION LOC MAMMO GUIDE 04/26/2022 [facility]   BREAST BIOPSY Right 04/26/2022   MM RT RADIO FREQUENCY TAG LOC MAMMO GUIDE 04/26/2022 [facility]   BREAST EXCISIONAL BIOPSY Bilateral [id number]   RUPTURED FOLLICULAR CYSTS WITH ABSCESSES AND SCARRING, CONSISTENT WITH HIDRADENITIS SUPPURATIVA.    BREAST LUMPECTOMY WITH RADIOFREQUENCY TAG IDENTIFICATION Right 05/09/2022   Procedure: BREAST LUMPECTOMY WITH RADIOFREQUENCY TAG IDENTIFICATION;  Surgeon: Olean Ree, MD;  Location: [facility];  Service: General;  Laterality: Right;   CATARACT EXTRACTION W/PHACO Right 07/27/2020    Procedure: CATARACT EXTRACTION PHACO AND INTRAOCULAR  LENS PLACEMENT (IOC) RIGHT DIABETIC 17.13 01:23.9;  Surgeon: Birder Robson, MD;  Location: [place];  Service: Ophthalmology;  Laterality: Right   CATARACT EXTRACTION W/PHACO Left 04/11/2022   Procedure: CATARACT EXTRACTION PHACO AND INTRAOCULAR LENS PLACEMENT (IOC) LEFT DIABETIC 15.05 01:18.6;  Surgeon: Birder Robson, MD;  Location: [place];  Service: Ophthalmology;  Laterality: Left   CESAREAN SECTION     x 2   CHOLECYSTECTOMY     COLONOSCOPY WITH PROPOFOL N/A 09/15/2021   Procedure: COLONOSCOPY WITH PROPOFOL;  Surgeon: Lin Landsman, MD;  [facility];  Service: Gastroenterology;  Laterality: N/A;   HEEL SPUR EXCISION N/A    HYDRADENITIS EXCISION Right 12/31/2015   Procedure: EXCISION HIDRADENITIS AXILLA;  Surgeon: Clayburn Pert, MD;  Location: [facility];  Service: General;  Laterality: Right;   KNEE SURGERY Left    1998   TONSILLECTOMY      FAMILY HISTORY: family history includes Breast cancer in her paternal grandmother; COPD in her mother; Heart disease in her father; Hypertension in her father; Rashes / Skin problems in her father.  SOCIAL HISTORY:  reports that she quit smoking about 28 years ago. Her smoking use included cigarettes. She has a 60.00 pack-year smoking history. She has never used smokeless tobacco. She reports that she does not drink alcohol and does not use drugs.  ALLERGIES: Codeine and Levofloxacin  MEDICATIONS:  Current Outpatient Medications  Medication Sig Dispense Refill   acetaminophen (TYLENOL) 500 MG tablet Take 500 mg by mouth every 6 (six) hours as needed.     acetaminophen (TYLENOL) 500 MG tablet Take 2 tablets (1,000 mg total) by mouth every 6 (six) hours as needed for mild pain.     albuterol (PROVENTIL HFA;VENTOLIN HFA) 108 (90 Base) MCG/ACT inhaler Inhale 2 puffs into the lungs every 6 (six) hours as needed for wheezing or shortness of breath.      aspirin EC 81 MG tablet Take 81 mg by mouth daily.     atorvastatin (LIPITOR) 20 MG tablet Take 20 mg by mouth at bedtime.      BIOTIN MAXIMUM PO Take by mouth daily.     budesonide-formoterol (SYMBICORT) 160-4.5 MCG/ACT inhaler Inhale 2 puffs into the lungs 2 (two) times daily.     clobetasol (OLUX) 0.05 % topical foam Apply topically 2 (two) times daily. 50 g 0   doxepin (SINEQUAN) 50 MG capsule Take 50 mg by mouth at bedtime.     DULoxetine (CYMBALTA) 20 MG capsule Take 40 mg by mouth daily.     empagliflozin (JARDIANCE) 10 MG TABS tablet Take 25 mg by mouth daily.     fluticasone (FLONASE) 50 MCG/ACT nasal spray as needed.     gabapentin (NEURONTIN) 300 MG capsule Take 600 mg by mouth 2 (two) times daily.  0   glucose blood test strip TEST three times a day     ibuprofen (ADVIL) 800 MG tablet Take 1 tablet (800 mg total) by mouth every 8 (eight) hours as needed for moderate pain. 60 tablet 1   ibuprofen (ADVIL,MOTRIN) 800 MG tablet Take 1 tablet (800 mg total) by mouth every 8 (eight) hours as needed for mild pain or moderate pain (with food). 20 tablet 0   ipratropium (ATROVENT HFA) 17 MCG/ACT inhaler Inhale 2 puffs into the lungs every 6 (six) hours as needed for wheezing. Patient uses once a day     ipratropium-albuterol (DUONEB) 0.5-2.5 (3) MG/3ML SOLN Take 3 mLs by nebulization every 6 (six) hours as  needed. 360 mL 10   ketoconazole (NIZORAL) 2 % shampoo Shampoo into scalp let sit 10 minutes then wash out. Use 3d/wk. 120 mL 11   Lancets Misc. (ACCU-CHEK FASTCLIX LANCET) KIT      levocetirizine (XYZAL) 5 MG tablet TAKE 1 TABLET BY MOUTH EVERY EVENING 30 tablet 5   levothyroxine (SYNTHROID, LEVOTHROID) 200 MCG tablet Take 175 mcg by mouth daily before breakfast.  1   Melatonin 5 MG CAPS Take 2 capsules by mouth daily.     olopatadine (PATANOL) 0.1 % ophthalmic solution Place 1 drop into both eyes 2 (two) times daily.     omeprazole (PRILOSEC) 40 MG capsule Take 40 mg by mouth daily.      OXYGEN Inhale into the lungs. With CPAP     potassium citrate (UROCIT-K) 10 MEQ (1080 MG) SR tablet TAKE ONE TABLET BY MOUTH EVERY MORNING 90 tablet 3   psyllium (METAMUCIL) 58.6 % powder Take 1 packet by mouth as needed.     roflumilast (DALIRESP) 500 MCG TABS tablet Take 500 mcg by mouth daily.     tirzepatide [facility]) 5 MG/0.5ML Pen Inject 5 mg into the skin once a week. Saturday     torsemide (DEMADEX) 20 MG tablet TAKE ONE TABLET BY MOUTH DAILY 90 tablet 3   traZODone (DESYREL) 50 MG tablet Take 50-100 mg by mouth at bedtime. Takes 150 mg     No current facility-administered medications for this encounter.    ECOG PERFORMANCE STATUS:  0 - Asymptomatic  REVIEW OF SYSTEMS: Patient has a history of COPD. Patient denies any weight loss, fatigue, weakness, fever, chills or night sweats. Patient denies any loss of vision, blurred vision. Patient denies any ringing  of the ears or hearing loss. No irregular heartbeat. Patient denies heart murmur or history of fainting. Patient denies any chest pain or pain radiating to her upper extremities. Patient denies any shortness of breath, difficulty breathing at night, cough or hemoptysis. Patient denies any swelling in the lower legs. Patient denies any nausea vomiting, vomiting of blood, or coffee ground material in the vomitus. Patient denies any stomach pain. Patient states has had normal bowel movements no significant constipation or diarrhea. Patient denies any dysuria, hematuria or significant nocturia. Patient denies any problems walking, swelling in the joints or loss of balance. Patient denies any skin changes, loss of hair or loss of weight. Patient denies any excessive worrying or anxiety or significant depression. Patient denies any problems with insomnia. Patient denies excessive thirst, polyuria, polydipsia. Patient denies any swollen glands, patient denies easy bruising or easy bleeding. Patient denies any recent infections, allergies or URI.  Patient "s visual fields have not changed significantly in recent time.   PHYSICAL EXAM: There were no vitals taken for this visit. She is a wide local excision of right breast which is well-healed.  No dominant masses noted in either breast no axillary or supraclavicular adenopathy is identified.  Patient is on nasal oxygen wheelchair-bound.  Well-developed well-nourished patient in NAD. HEENT reveals PERLA, EOMI, discs not visualized.  Oral cavity is clear. No oral mucosal lesions are identified. Neck is clear without evidence of cervical or supraclavicular adenopathy. Lungs are clear to A&P. Cardiac examination is essentially unremarkable with regular rate and rhythm without murmur rub or thrill. Abdomen is benign with no organomegaly or masses noted. Motor sensory and DTR levels are equal and symmetric in the upper and lower extremities. Cranial nerves II through XII are grossly intact. Proprioception is intact. No peripheral  adenopathy or edema is identified. No motor or sensory levels are noted. Crude visual fields are within normal range.  LABORATORY DATA: Pathology reports reviewed    RADIOLOGY RESULTS: Mammogram and ultrasound reviewed compatible with above-stated findings   IMPRESSION: ER positive ductal carcinoma in situ of the right breast status post wide local excision ER positive in 67 year old female  PLAN: At this time of recommended hypofractionated course of whole breast radiation over 3 weeks would also boost her scar another 1000 centigrade using a photon boost.  Risks and benefits of treatment including skin reaction fatigue alteration blood counts possible inclusion of superficial lung all were discussed in detail with the patient.  I personally set up and ordered CT simulation for early next week.  Patient comprehends my recommendations well.  She would benefit from endocrine therapy after completion of radiation.  I would like to take this opportunity to thank you for  allowing me to participate in the care of your patient.Noreene Filbert, MD

## 2022-05-25 NOTE — Progress Notes (Signed)
[place]  [phone] [phone]  ID: [name] [name] OB: April 04, 1955  MR#: [id number]  [id number]  Patient Care Team: Denton Lank, MD as PCP - General (Family Medicine) Kate Sable, MD as PCP - Cardiology (Cardiology) Alisa Graff, FNP as Nurse Practitioner (Family Medicine) Erby Pian, MD as Consulting Physician (Pulmonary Disease) [name] Javier Docker, MD as Consulting Physician (Cardiology) Daiva Huge, RN as Oncology Nurse Navigator  CHIEF COMPLAINT: DCIS, right breast.  INTERVAL HISTORY: Patient is a 67 year old female who recently underwent lumpectomy for ADH, but for pathology revealed noninvasive DCIS.  She is referred for further evaluation.  Patient currently feels well and is asymptomatic.  She has no neurologic complaints.  She denies any recent fevers or illnesses.  She has a good appetite and denies weight loss.  She has no chest pain, shortness of breath, cough, or hemoptysis.  She denies any nausea, vomiting, constipation, or diarrhea.  She has no urinary complaints.  Patient feels at her baseline offers no specific complaints today.  REVIEW OF SYSTEMS:   Review of Systems  Constitutional: Negative.  Negative for fever, malaise/fatigue and weight loss.  Respiratory: Negative.  Negative for cough, hemoptysis and shortness of breath.   Cardiovascular: Negative.  Negative for chest pain and leg swelling.  Gastrointestinal: Negative.  Negative for abdominal pain.  Genitourinary: Negative.  Negative for dysuria.  Musculoskeletal: Negative.  Negative for back pain.  Skin: Negative.  Negative for rash.  Neurological: Negative.  Negative for dizziness, focal weakness, weakness and headaches.  Psychiatric/Behavioral: Negative.  The patient is not nervous/anxious.     As per HPI. Otherwise, a complete review of systems is negative.  PAST MEDICAL HISTORY: Past Medical History:  Diagnosis Date   (HFpEF) heart failure  with preserved ejection fraction ([place])    a.) TTE 05/21/2016: EF 50%, mild LV dil, ant HK, mild RAE, G1DD; b.) TTE 06/06/2019: EF 60-65%, G2DD; c.) TTE 04/04/2021: EF 60-65%, mod LAE, mild RAE, mod MAC, mild MV stenosis, G2DD   Allergic rhinitis    Allergy    Aortic atherosclerosis (HCC)    Asthma    Chronic back pain    COPD (chronic obstructive pulmonary disease) (HCC)    Degenerative joint disease of knee, left    Depression    Diverticulosis    Dyspnea    Edema    GERD (gastroesophageal reflux disease)    Headache    Hidradenitis    History of bilateral cataract extraction    History of colonic polyps    HLD (hyperlipidemia)    Hypertension    Hypothyroidism    IBS (irritable bowel syndrome)    IDA (iron deficiency anemia)    Insomnia    a.) takes trazodone PRN   Intraductal papilloma of breast, right    Mitral valve stenosis 04/04/2021   a.) TTE 04/04/2021: mild MS (MPG 5.0 mm Hg).   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    Sinusitis, chronic    T2DM (type 2 diabetes mellitus) ([place])    Vaginitis, atrophic    Vertigo    Vitamin D deficiency     PAST SURGICAL HISTORY: Past Surgical History:  Procedure Laterality Date  ABDOMINAL HYSTERECTOMY     AXILLARY HIDRADENITIS EXCISION     BREAST BIOPSY Right 02/22/2022   stereo bx, calcs, "X" clip- ATYPICAL INTRADUCTAL PAPILLARY PROLIFERATION WITH SCLEROSIS AND CALCIFICATION PROLIFERATION WITH SCLEROSIS AND CALCIFICATION   BREAST BIOPSY Right 02/22/2022   MM RT BREAST BX W LOC DEV 1ST LESION IMAGE BX SPEC STEREO GUIDE 02/22/2022 [facility]   BREAST BIOPSY Right 04/04/2022   rt br calcs coil clip, [place]   BREAST BIOPSY Right 04/04/2022   MM RT BREAST BX W LOC DEV 1ST LESION IMAGE BX SPEC  STEREO GUIDE 04/04/2022 [facility]   BREAST BIOPSY Right 04/26/2022   MM RT RADIO FREQUENCY TAG EA ADD LESION LOC MAMMO GUIDE 04/26/2022 [facility]   BREAST BIOPSY Right 04/26/2022   MM RT RADIO FREQUENCY TAG LOC MAMMO GUIDE 04/26/2022 [facility]   BREAST EXCISIONAL BIOPSY Bilateral [id number]   RUPTURED FOLLICULAR CYSTS WITH ABSCESSES AND SCARRING, CONSISTENT WITH HIDRADENITIS SUPPURATIVA.    BREAST LUMPECTOMY WITH RADIOFREQUENCY TAG IDENTIFICATION Right 05/09/2022   Procedure: BREAST LUMPECTOMY WITH RADIOFREQUENCY TAG IDENTIFICATION;  Surgeon: Olean Ree, MD;  Location: [facility];  Service: General;  Laterality: Right;   CATARACT EXTRACTION W/PHACO Right 07/27/2020   Procedure: CATARACT EXTRACTION PHACO AND INTRAOCULAR LENS PLACEMENT ([place]) RIGHT DIABETIC 17.13 01:23.9;  Surgeon: Birder Robson, MD;  Location: [place];  Service: Ophthalmology;  Laterality: Right   CATARACT EXTRACTION W/PHACO Left 04/11/2022   Procedure: CATARACT EXTRACTION PHACO AND INTRAOCULAR LENS PLACEMENT ([facility]) LEFT DIABETIC 15.05 01:18.6;  Surgeon: Birder Robson, MD;  Location: [place];  Service: Ophthalmology;  Laterality: Left   CESAREAN SECTION     x 2   CHOLECYSTECTOMY     COLONOSCOPY WITH PROPOFOL N/A 09/15/2021   Procedure: COLONOSCOPY WITH PROPOFOL;  Surgeon: Lin Landsman, MD;  [facility];  Service: Gastroenterology;  Laterality: N/A;   HEEL SPUR EXCISION N/A    HYDRADENITIS EXCISION Right 12/31/2015   Procedure: EXCISION HIDRADENITIS AXILLA;  Surgeon: Clayburn Pert, MD;  Location: [facility];  Service: General;  Laterality: Right;   KNEE SURGERY Left    1998   TONSILLECTOMY      FAMILY HISTORY: Family History  Problem Relation Age of Onset   Rashes / Skin problems Father    Hypertension Father    Heart disease Father    Breast cancer Paternal Grandmother    COPD Mother    Kidney cancer Neg Hx    Bladder Cancer Neg Hx     ADVANCED  DIRECTIVES (Y/N):  N  HEALTH MAINTENANCE: Social History   Tobacco Use   Smoking status: Former    Packs/day: 3.00    Years: 20.00    Total pack years: 60.00    Types: Cigarettes    Quit date: 12/18/1993    Years since quitting: 28.4   Smokeless tobacco: Never  Vaping Use   Vaping Use: Never used  Substance Use Topics   Alcohol use: No   Drug use: No    Types: Marijuana     Colonoscopy:  PAP:  Bone density:  Lipid panel:  Allergies  Allergen Reactions   Codeine Itching   Levofloxacin Rash    "welts" & itching    Current Outpatient Medications  Medication Sig Dispense Refill   acetaminophen (TYLENOL) 500 MG tablet Take 500 mg by mouth every 6 (six) hours as needed.     acetaminophen (TYLENOL) 500 MG tablet Take 2 tablets (1,000 mg total) by mouth every 6 (six) hours as needed for mild pain.  albuterol (PROVENTIL HFA;VENTOLIN HFA) 108 (90 Base) MCG/ACT inhaler Inhale 2 puffs into the lungs every 6 (six) hours as needed for wheezing or shortness of breath.     aspirin EC 81 MG tablet Take 81 mg by mouth daily.     atorvastatin (LIPITOR) 20 MG tablet Take 20 mg by mouth at bedtime.      BIOTIN MAXIMUM PO Take by mouth daily.     budesonide-formoterol (SYMBICORT) 160-4.5 MCG/ACT inhaler Inhale 2 puffs into the lungs 2 (two) times daily.     clobetasol (OLUX) 0.05 % topical foam Apply topically 2 (two) times daily. 50 g 0   doxepin (SINEQUAN) 50 MG capsule Take 50 mg by mouth at bedtime.     DULoxetine (CYMBALTA) 20 MG capsule Take 40 mg by mouth daily.     empagliflozin (JARDIANCE) 10 MG TABS tablet Take 25 mg by mouth daily.     fluticasone (FLONASE) 50 MCG/ACT nasal spray as needed.     gabapentin (NEURONTIN) 300 MG capsule Take 600 mg by mouth 2 (two) times daily.  0   glucose blood test strip TEST three times a day     ibuprofen (ADVIL) 800 MG tablet Take 1 tablet (800 mg total) by mouth every 8 (eight) hours as needed for moderate pain. 60 tablet 1   ibuprofen  (ADVIL,MOTRIN) 800 MG tablet Take 1 tablet (800 mg total) by mouth every 8 (eight) hours as needed for mild pain or moderate pain (with food). 20 tablet 0   ipratropium (ATROVENT HFA) 17 MCG/ACT inhaler Inhale 2 puffs into the lungs every 6 (six) hours as needed for wheezing. Patient uses once a day     ipratropium-albuterol (DUONEB) 0.5-2.5 (3) MG/3ML SOLN Take 3 mLs by nebulization every 6 (six) hours as needed. 360 mL 10   ketoconazole (NIZORAL) 2 % shampoo Shampoo into scalp let sit 10 minutes then wash out. Use 3d/wk. 120 mL 11   Lancets Misc. (ACCU-CHEK FASTCLIX LANCET) KIT      levocetirizine (XYZAL) 5 MG tablet TAKE 1 TABLET BY MOUTH EVERY EVENING 30 tablet 5   levothyroxine (SYNTHROID, LEVOTHROID) 200 MCG tablet Take 175 mcg by mouth daily before breakfast.  1   Melatonin 5 MG CAPS Take 2 capsules by mouth daily.     olopatadine (PATANOL) 0.1 % ophthalmic solution Place 1 drop into both eyes 2 (two) times daily.     omeprazole (PRILOSEC) 40 MG capsule Take 40 mg by mouth daily.     OXYGEN Inhale into the lungs. With CPAP     potassium citrate (UROCIT-K) 10 MEQ (1080 MG) SR tablet TAKE ONE TABLET BY MOUTH EVERY MORNING 90 tablet 3   psyllium (METAMUCIL) 58.6 % powder Take 1 packet by mouth as needed.     roflumilast (DALIRESP) 500 MCG TABS tablet Take 500 mcg by mouth daily.     tirzepatide [facility]) 5 MG/0.5ML Pen Inject 5 mg into the skin once a week. Saturday     torsemide (DEMADEX) 20 MG tablet TAKE ONE TABLET BY MOUTH DAILY 90 tablet 3   traZODone (DESYREL) 50 MG tablet Take 50-100 mg by mouth at bedtime. Takes 150 mg     No current facility-administered medications for this visit.    OBJECTIVE: Vitals:   05/25/22 1001  BP: (!) 112/54  Pulse: 83  Resp: 20  Temp: 97.7 F (36.5 C)  SpO2: 98%     Body mass index is 43.77 kg/m.    ECOG FS:0 - Asymptomatic  General:  Well-developed, well-nourished, no acute distress. Eyes: Pink conjunctiva, anicteric sclera. HEENT:  Normocephalic, moist mucous membranes. Breasts: Exam deferred today. Lungs: No audible wheezing or coughing. Heart: Regular rate and rhythm. Abdomen: Soft, nontender, no obvious distention. Musculoskeletal: No edema, cyanosis, or clubbing. Neuro: Alert, answering all questions appropriately. Cranial nerves grossly intact. Skin: No rashes or petechiae noted. Psych: Normal affect. Lymphatics: No cervical, calvicular, axillary or inguinal LAD.   LAB RESULTS:  Lab Results  Component Value Date   NA 132 (L) 05/12/2021   K 4.4 05/12/2021   CL 98 05/12/2021   CO2 25 05/12/2021   GLUCOSE 195 (H) 05/12/2021   BUN 33 (H) 05/12/2021   CREATININE 1.16 (H) 05/12/2021   CALCIUM 9.2 05/12/2021   PROT 7.4 06/25/2017   ALBUMIN 3.4 (L) 06/25/2017   AST 19 06/25/2017   ALT 11 (L) 06/25/2017   ALKPHOS 122 06/25/2017   BILITOT 0.6 06/25/2017   GFRNONAA 53 (L) 05/12/2021   GFRAA >60 09/29/2019    Lab Results  Component Value Date   WBC 21.4 (H) 10/07/2019   NEUTROABS 17.3 (H) 10/07/2019   HGB 13.8 10/07/2019   HCT 41.9 10/07/2019   MCV 80.3 10/07/2019   PLT 275 10/07/2019     STUDIES: MM Breast Surgical Specimen  Result Date: 05/09/2022 CLINICAL DATA:  Status post radiofrequency tag localized right breast excision. EXAM: SPECIMEN RADIOGRAPH OF THE RIGHT BREAST COMPARISON:  Previous exam(s). FINDINGS: Status post excision of the right breast. Both radiofrequency tag and adjacent ex and coil shaped biopsy marking clips are present within the specimen. IMPRESSION: Specimen radiograph of the right breast. Electronically Signed   By: Everlean Alstrom M.D.   On: 05/09/2022 16:11  MM RT RADIO FREQUENCY TAG LOC MAMMO GUIDE  Result Date: 04/26/2022 CLINICAL DATA:  67 year old female with biopsy-proven ADH (X clip) and a nearby biopsied area with benign pathology (coil clip), presenting for a 2 site localization prior to lumpectomy. EXAM: MAMMOGRAPHIC GUIDED RADIOFREQUENCY DEVICE LOCALIZATION OF  THE RIGHT BREAST COMPARISON:  Previous exam(s). FINDINGS: Patient presents for radiofrequency device localization prior to lumpectomy. I met with the patient and we discussed the procedure of radiofrequency device localization including benefits and alternatives. We discussed the high likelihood of a successful procedure. We discussed the risks of the procedure including infection, bleeding, tissue injury and further surgery. Informed, written consent was given. The usual time-out protocol was performed immediately prior to the procedure. Using mammographic guidance, sterile technique, 1% lidocaine as local anesthesia, a radiofrequency tag was used to localize the X shaped clip and coil shaped clip using a lateral approach. The follow-up mammogram images confirm that the RF devices are in the expected location and are marked for Dr. Hampton Abbot. The patient tolerated the procedure well and was released from the [facility]. IMPRESSION: Radiofrequency device localization of the RIGHT breast x2. No apparent complications. Electronically Signed   By: Beryle Flock M.D.   On: 04/26/2022 16:24  MM RT RADIO FREQUENCY TAG EA ADD LESION LOC MAMMO GUIDE  Result Date: 04/26/2022 CLINICAL DATA:  67 year old female with biopsy-proven ADH (X clip) and a nearby biopsied area with benign pathology (coil clip), presenting for a 2 site localization prior to lumpectomy. EXAM: MAMMOGRAPHIC GUIDED RADIOFREQUENCY DEVICE LOCALIZATION OF THE RIGHT BREAST COMPARISON:  Previous exam(s). FINDINGS: Patient presents for radiofrequency device localization prior to lumpectomy. I met with the patient and we discussed the procedure of radiofrequency device localization including benefits and alternatives. We discussed the high likelihood of a successful procedure. We  discussed the risks of the procedure including infection, bleeding, tissue injury and further surgery. Informed, written consent was given. The usual time-out protocol was  performed immediately prior to the procedure. Using mammographic guidance, sterile technique, 1% lidocaine as local anesthesia, a radiofrequency tag was used to localize the X shaped clip and coil shaped clip using a lateral approach. The follow-up mammogram images confirm that the RF devices are in the expected location and are marked for Dr. Hampton Abbot. The patient tolerated the procedure well and was released from the Breast Center. IMPRESSION: Radiofrequency device localization of the RIGHT breast x2. No apparent complications. Electronically Signed   By: Beryle Flock M.D.   On: 04/26/2022 16:24   ASSESSMENT: DCIS, right breast.  PLAN:    DCIS, right breast: Pathology reviewed independently.  Patient underwent lumpectomy on May 09, 2022.  Given the noninvasive nature of her pathology, she does not require adjuvant chemotherapy.  She has an appointment with radiation oncology later this morning to discuss adjuvant XRT.  At the conclusion of her radiation treatments that she will benefit from tamoxifen for a total of 5 years.  Return to clinic during the last week of her XRT for further evaluation and initiation of tamoxifen. Dermatomyositis: Patient reports she has an appointment with rheumatology in the near future.  I spent a total of 60 minutes reviewing chart data, face-to-face evaluation with the patient, counseling and coordination of care as detailed above.   Patient expressed understanding and was in agreement with this plan. She also understands that She can call clinic at any time with any questions, concerns, or complaints.    Cancer Staging  Ductal carcinoma in situ (DCIS) of right breast Staging form: Breast, AJCC 8th Edition - Clinical stage from 05/25/2022: Stage 0 (cTis (DCIS), cN0, cM0, ER+, PR: Not Assessed, HER2: Not Assessed) - Signed by Lloyd Huger, MD on 05/25/2022 Stage prefix: Initial diagnosis Nuclear grade: G2   Lloyd Huger, MD   05/25/2022 11:15  AM

## 2022-05-29 ENCOUNTER — Ambulatory Visit: Payer: 59

## 2022-05-30 ENCOUNTER — Encounter: Payer: Self-pay | Admitting: *Deleted

## 2022-05-30 ENCOUNTER — Ambulatory Visit
Admission: RE | Admit: 2022-05-30 | Discharge: 2022-05-30 | Disposition: A | Discharge status: Home / Self Care | Payer: 59 | Source: Ambulatory Visit | Attending: Radiation Oncology | Admitting: Radiation Oncology

## 2022-05-30 DIAGNOSIS — Z510 Encounter for antineoplastic radiation therapy: Secondary | ICD-10-CM | POA: Diagnosis not present

## 2022-05-31 ENCOUNTER — Ambulatory Visit: Payer: 59

## 2022-06-01 ENCOUNTER — Ambulatory Visit (INDEPENDENT_AMBULATORY_CARE_PROVIDER_SITE_OTHER): Payer: 59 | Admitting: Dermatology

## 2022-06-01 DIAGNOSIS — M3390 Dermatopolymyositis, unspecified, organ involvement unspecified: Secondary | ICD-10-CM

## 2022-06-01 NOTE — Progress Notes (Signed)
   Follow-Up Visit   Subjective  [name] [name] is a 67 y.o. female who presents for the following: Suture / Staple Removal (Here for suture removal at scalp. Discuss pathology results and treatment options).  The following portions of the chart were reviewed this encounter and updated as appropriate:  Tobacco  Allergies  Meds  Problems  Med Hx  Surg Hx  Fam Hx      Review of Systems: No other skin or systemic complaints except as noted in HPI or Assessment and Plan.   Objective  Well appearing patient in no apparent distress; mood and affect are within normal limits.  A focused examination was performed including head, including the scalp, face, neck, nose, ears, eyelids, and lips. Relevant physical exam findings are noted in the Assessment and Plan.   Assessment & Plan   Dermatomyositis  Chronic and persistent condition with expected duration over one year. Condition is bothersome/symptomatic for patient. Currently flared.  Reviewed notes from surgery and oncology.  Patient with known underlying ductal carcinoma in situ, currently in treatment.  Discussed result with patient  Dermatomyositis is an multisystem autoimmune condition that most often causes muscle weakness, tiredness, and rashes. It can also cause difficulty swallowing and affecting the lungs or heart. Up to 40% of adults with dermatomyositis have an underlying cancer.   Dermatomyositis is often managed together with dermatology, rheumatology and other specialists as needed.   Cancer screening is recommended at the time of diagnosis and then serially for 2-3 years.   Bloodwork can help know what organ systems may be affected and how high cancer risk may be.   Dermatomyositis can also occur together with other autoimmune conditions.  Will call Dr. Posey Pronto (rheumatology) to discuss and see if he would like [place] to initiate workup or have her see him first, and see when he would like to see her.  Encounter for  Removal of Sutures - Incision site at the scalp is clean, dry and intact - Wound cleansed, sutures removed, wound cleansed and steri strips applied.  - Discussed pathology results showing Dermatomyositis  - Patient advised to keep steri-strips dry until they fall off. - Scars remodel for a full year. - Once steri-strips fall off, patient can apply over-the-counter silicone scar cream each night to help with scar remodeling if desired. - Patient advised to call with any concerns or if they notice any new or changing lesions.   Return for Follow Up in 3-4 months.  I, Emelia Salisbury, CMA, am acting as scribe for Forest Gleason, MD.  Documentation: I have reviewed the above documentation for accuracy and completeness, and I agree with the above.  Forest Gleason, MD

## 2022-06-01 NOTE — Patient Instructions (Signed)
Due to recent changes in healthcare laws, you may see results of your pathology and/or laboratory studies on MyChart before the doctors have had a chance to review them. We understand that in some cases there may be results that are confusing or concerning to you. Please understand that not all results are received at the same time and often the doctors may need to interpret multiple results in order to provide you with the best plan of care or course of treatment. Therefore, we ask that you please give us 2 business days to thoroughly review all your results before contacting the office for clarification. Should we see a critical lab result, you will be contacted sooner.   If You Need Anything After Your Visit  If you have any questions or concerns for your doctor, please call our main line at [phone] and press option 4 to reach your doctor's medical assistant. If no one answers, please leave a voicemail as directed and we will return your call as soon as possible. Messages left after 4 pm will be answered the following business day.   You may also send us a message via MyChart. We typically respond to MyChart messages within 1-2 business days.  For prescription refills, please ask your pharmacy to contact our office. Our fax number is [phone].  If you have an urgent issue when the clinic is closed that cannot wait until the next business day, you can page your doctor at the number below.    Please note that while we do our best to be available for urgent issues outside of office hours, we are not available 24/7.   If you have an urgent issue and are unable to reach us, you may choose to seek medical care at your doctor's office, retail clinic, urgent care center, or emergency room.  If you have a medical emergency, please immediately call 911 or go to the emergency department.  Pager Numbers  - Dr. Kowalski: [phone]  - Dr. Moye: [phone]  - Dr. Stewart:  [phone]  In the event of inclement weather, please call our main line at [phone] for an update on the status of any delays or closures.  Dermatology Medication Tips: Please keep the boxes that topical medications come in in order to help keep track of the instructions about where and how to use these. Pharmacies typically print the medication instructions only on the boxes and not directly on the medication tubes.   If your medication is too expensive, please contact our office at [phone] option 4 or send us a message through MyChart.   We are unable to tell what your co-pay for medications will be in advance as this is different depending on your insurance coverage. However, we may be able to find a substitute medication at lower cost or fill out paperwork to get insurance to cover a needed medication.   If a prior authorization is required to get your medication covered by your insurance company, please allow us 1-2 business days to complete this process.  Drug prices often vary depending on where the prescription is filled and some pharmacies may offer cheaper prices.  The website www.goodrx.com contains coupons for medications through different pharmacies. The prices here do not account for what the cost may be with help from insurance (it may be cheaper with your insurance), but the website can give you the price if you did not use any insurance.  - You can print the associated coupon and take it with   your prescription to the pharmacy.  - You may also stop by our office during regular business hours and pick up a GoodRx coupon card.  - If you need your prescription sent electronically to a different pharmacy, notify our office through [place] MyChart or by phone at [phone] option 4.     Si Usted Necesita Algo Despus de Su Visita  Tambin puede enviarnos un mensaje a travs de MyChart. Por lo general respondemos a los mensajes de MyChart en el transcurso de 1 a 2  das hbiles.  Para renovar recetas, por favor pida a su farmacia que se ponga en contacto con nuestra oficina. Nuestro nmero de fax es el [phone].  Si tiene un asunto urgente cuando la clnica est cerrada y que no puede esperar hasta el siguiente da hbil, puede llamar/localizar a su doctor(a) al nmero que aparece a continuacin.   Por favor, tenga en cuenta que aunque hacemos todo lo posible para estar disponibles para asuntos urgentes fuera del horario de oficina, no estamos disponibles las 24 horas del da, los 7 das de la semana.   Si tiene un problema urgente y no puede comunicarse con nosotros, puede optar por buscar atencin mdica  en el consultorio de su doctor(a), en una clnica privada, en un centro de atencin urgente o en una sala de emergencias.  Si tiene una emergencia mdica, por favor llame inmediatamente al 911 o vaya a la sala de emergencias.  Nmeros de bper  - Dr. Kowalski: [phone]  - Dra. Moye: [phone]  - Dra. Stewart: [phone]  En caso de inclemencias del tiempo, por favor llame a nuestra lnea principal al [phone] para una actualizacin sobre el estado de cualquier retraso o cierre.  Consejos para la medicacin en dermatologa: Por favor, guarde las cajas en las que vienen los medicamentos de uso tpico para ayudarle a seguir las instrucciones sobre dnde y cmo usarlos. Las farmacias generalmente imprimen las instrucciones del medicamento slo en las cajas y no directamente en los tubos del medicamento.   Si su medicamento es muy caro, por favor, pngase en contacto con nuestra oficina llamando al [phone] y presione la opcin 4 o envenos un mensaje a travs de MyChart.   No podemos decirle cul ser su copago por los medicamentos por adelantado ya que esto es diferente dependiendo de la cobertura de su seguro. Sin embargo, es posible que podamos encontrar un medicamento sustituto a menor costo o llenar un formulario para que el  seguro cubra el medicamento que se considera necesario.   Si se requiere una autorizacin previa para que su compaa de seguros cubra su medicamento, por favor permtanos de 1 a 2 das hbiles para completar este proceso.  Los precios de los medicamentos varan con frecuencia dependiendo del lugar de dnde se surte la receta y alguna farmacias pueden ofrecer precios ms baratos.  El sitio web www.goodrx.com tiene cupones para medicamentos de diferentes farmacias. Los precios aqu no tienen en cuenta lo que podra costar con la ayuda del seguro (puede ser ms barato con su seguro), pero el sitio web puede darle el precio si no utiliz ningn seguro.  - Puede imprimir el cupn correspondiente y llevarlo con su receta a la farmacia.  - Tambin puede pasar por nuestra oficina durante el horario de atencin regular y recoger una tarjeta de cupones de GoodRx.  - Si necesita que su receta se enve electrnicamente a una farmacia diferente, informe a nuestra oficina a travs de MyChart de [place]   o por telfono llamando al [phone] y presione la opcin 4.  

## 2022-06-02 ENCOUNTER — Other Ambulatory Visit: Payer: Self-pay | Admitting: *Deleted

## 2022-06-02 DIAGNOSIS — D0511 Intraductal carcinoma in situ of right breast: Secondary | ICD-10-CM

## 2022-06-05 ENCOUNTER — Ambulatory Visit: Payer: Medicare Other

## 2022-06-05 ENCOUNTER — Encounter: Payer: Self-pay | Admitting: Dermatology

## 2022-06-05 DIAGNOSIS — Z510 Encounter for antineoplastic radiation therapy: Secondary | ICD-10-CM | POA: Diagnosis not present

## 2022-06-06 ENCOUNTER — Ambulatory Visit: Admission: RE | Admit: 2022-06-06 | Payer: 59 | Source: Ambulatory Visit

## 2022-06-06 DIAGNOSIS — Z510 Encounter for antineoplastic radiation therapy: Secondary | ICD-10-CM | POA: Diagnosis not present

## 2022-06-07 ENCOUNTER — Ambulatory Visit: Payer: 59

## 2022-06-07 ENCOUNTER — Ambulatory Visit
Admission: RE | Admit: 2022-06-07 | Discharge: 2022-06-07 | Disposition: A | Discharge status: Home / Self Care | Payer: 59 | Source: Ambulatory Visit | Attending: Radiation Oncology | Admitting: Radiation Oncology

## 2022-06-07 ENCOUNTER — Other Ambulatory Visit: Payer: Self-pay

## 2022-06-07 DIAGNOSIS — Z510 Encounter for antineoplastic radiation therapy: Secondary | ICD-10-CM | POA: Diagnosis not present

## 2022-06-07 LAB — RAD ONC ARIA SESSION SUMMARY
Course Elapsed Days: 0
Plan Fractions Treated to Date: 1
Plan Prescribed Dose Per Fraction: 2.66 Gy
Plan Total Fractions Prescribed: 16
Plan Total Prescribed Dose: 42.56 Gy
Reference Point Dosage Given to Date: 2.66 Gy
Reference Point Session Dosage Given: 2.66 Gy
Session Number: 1

## 2022-06-08 ENCOUNTER — Other Ambulatory Visit: Payer: Self-pay

## 2022-06-08 ENCOUNTER — Ambulatory Visit
Admission: RE | Admit: 2022-06-08 | Discharge: 2022-06-08 | Disposition: A | Discharge status: Home / Self Care | Payer: 59 | Source: Ambulatory Visit | Attending: Radiation Oncology | Admitting: Radiation Oncology

## 2022-06-08 DIAGNOSIS — Z510 Encounter for antineoplastic radiation therapy: Secondary | ICD-10-CM | POA: Diagnosis not present

## 2022-06-08 LAB — RAD ONC ARIA SESSION SUMMARY
Course Elapsed Days: 1
Plan Fractions Treated to Date: 2
Plan Prescribed Dose Per Fraction: 2.66 Gy
Plan Total Fractions Prescribed: 16
Plan Total Prescribed Dose: 42.56 Gy
Reference Point Dosage Given to Date: 5.32 Gy
Reference Point Session Dosage Given: 2.66 Gy
Session Number: 2

## 2022-06-09 ENCOUNTER — Ambulatory Visit
Admission: RE | Admit: 2022-06-09 | Discharge: 2022-06-09 | Disposition: A | Discharge status: Home / Self Care | Payer: 59 | Source: Ambulatory Visit | Attending: Radiation Oncology | Admitting: Radiation Oncology

## 2022-06-09 ENCOUNTER — Other Ambulatory Visit: Payer: Self-pay

## 2022-06-09 DIAGNOSIS — Z510 Encounter for antineoplastic radiation therapy: Secondary | ICD-10-CM | POA: Diagnosis not present

## 2022-06-09 LAB — RAD ONC ARIA SESSION SUMMARY
Course Elapsed Days: 2
Plan Fractions Treated to Date: 3
Plan Prescribed Dose Per Fraction: 2.66 Gy
Plan Total Fractions Prescribed: 16
Plan Total Prescribed Dose: 42.56 Gy
Reference Point Dosage Given to Date: 7.98 Gy
Reference Point Session Dosage Given: 2.66 Gy
Session Number: 3

## 2022-06-12 ENCOUNTER — Ambulatory Visit
Admission: RE | Admit: 2022-06-12 | Discharge: 2022-06-12 | Disposition: A | Discharge status: Home / Self Care | Payer: 59 | Source: Ambulatory Visit | Attending: Radiation Oncology | Admitting: Radiation Oncology

## 2022-06-12 ENCOUNTER — Other Ambulatory Visit: Payer: Self-pay

## 2022-06-12 DIAGNOSIS — Z510 Encounter for antineoplastic radiation therapy: Secondary | ICD-10-CM | POA: Diagnosis not present

## 2022-06-12 LAB — RAD ONC ARIA SESSION SUMMARY
Course Elapsed Days: 5
Plan Fractions Treated to Date: 4
Plan Prescribed Dose Per Fraction: 2.66 Gy
Plan Total Fractions Prescribed: 16
Plan Total Prescribed Dose: 42.56 Gy
Reference Point Dosage Given to Date: 10.64 Gy
Reference Point Session Dosage Given: 2.66 Gy
Session Number: 4

## 2022-06-13 ENCOUNTER — Other Ambulatory Visit: Payer: Self-pay

## 2022-06-13 ENCOUNTER — Ambulatory Visit
Admission: RE | Admit: 2022-06-13 | Discharge: 2022-06-13 | Disposition: A | Discharge status: Home / Self Care | Payer: 59 | Source: Ambulatory Visit | Attending: Radiation Oncology | Admitting: Radiation Oncology

## 2022-06-13 DIAGNOSIS — Z510 Encounter for antineoplastic radiation therapy: Secondary | ICD-10-CM | POA: Diagnosis not present

## 2022-06-13 LAB — RAD ONC ARIA SESSION SUMMARY
Course Elapsed Days: 6
Plan Fractions Treated to Date: 5
Plan Prescribed Dose Per Fraction: 2.66 Gy
Plan Total Fractions Prescribed: 16
Plan Total Prescribed Dose: 42.56 Gy
Reference Point Dosage Given to Date: 13.3 Gy
Reference Point Session Dosage Given: 2.66 Gy
Session Number: 5

## 2022-06-14 ENCOUNTER — Ambulatory Visit
Admission: RE | Admit: 2022-06-14 | Discharge: 2022-06-14 | Disposition: A | Discharge status: Home / Self Care | Payer: 59 | Source: Ambulatory Visit | Attending: Radiation Oncology | Admitting: Radiation Oncology

## 2022-06-14 ENCOUNTER — Other Ambulatory Visit: Payer: Self-pay

## 2022-06-14 DIAGNOSIS — Z510 Encounter for antineoplastic radiation therapy: Secondary | ICD-10-CM | POA: Diagnosis not present

## 2022-06-14 LAB — RAD ONC ARIA SESSION SUMMARY
Course Elapsed Days: 7
Plan Fractions Treated to Date: 6
Plan Prescribed Dose Per Fraction: 2.66 Gy
Plan Total Fractions Prescribed: 16
Plan Total Prescribed Dose: 42.56 Gy
Reference Point Dosage Given to Date: 15.96 Gy
Reference Point Session Dosage Given: 2.66 Gy
Session Number: 6

## 2022-06-15 ENCOUNTER — Other Ambulatory Visit: Payer: Self-pay

## 2022-06-15 ENCOUNTER — Inpatient Hospital Stay: Payer: 59

## 2022-06-15 ENCOUNTER — Ambulatory Visit
Admission: RE | Admit: 2022-06-15 | Discharge: 2022-06-15 | Disposition: A | Discharge status: Home / Self Care | Payer: 59 | Source: Ambulatory Visit | Attending: Radiation Oncology | Admitting: Radiation Oncology

## 2022-06-15 DIAGNOSIS — Z510 Encounter for antineoplastic radiation therapy: Secondary | ICD-10-CM | POA: Diagnosis not present

## 2022-06-15 LAB — RAD ONC ARIA SESSION SUMMARY
Course Elapsed Days: 8
Plan Fractions Treated to Date: 7
Plan Prescribed Dose Per Fraction: 2.66 Gy
Plan Total Fractions Prescribed: 16
Plan Total Prescribed Dose: 42.56 Gy
Reference Point Dosage Given to Date: 18.62 Gy
Reference Point Session Dosage Given: 2.66 Gy
Session Number: 7

## 2022-06-16 ENCOUNTER — Ambulatory Visit
Admission: RE | Admit: 2022-06-16 | Discharge: 2022-06-16 | Disposition: A | Discharge status: Home / Self Care | Payer: 59 | Source: Ambulatory Visit | Attending: Radiation Oncology | Admitting: Radiation Oncology

## 2022-06-16 ENCOUNTER — Other Ambulatory Visit: Payer: Self-pay

## 2022-06-16 DIAGNOSIS — Z510 Encounter for antineoplastic radiation therapy: Secondary | ICD-10-CM | POA: Diagnosis not present

## 2022-06-16 LAB — RAD ONC ARIA SESSION SUMMARY
Course Elapsed Days: 9
Plan Fractions Treated to Date: 8
Plan Prescribed Dose Per Fraction: 2.66 Gy
Plan Total Fractions Prescribed: 16
Plan Total Prescribed Dose: 42.56 Gy
Reference Point Dosage Given to Date: 21.28 Gy
Reference Point Session Dosage Given: 2.66 Gy
Session Number: 8

## 2022-06-19 ENCOUNTER — Other Ambulatory Visit: Payer: Self-pay

## 2022-06-19 ENCOUNTER — Ambulatory Visit
Admission: RE | Admit: 2022-06-19 | Discharge: 2022-06-19 | Disposition: A | Discharge status: Home / Self Care | Payer: 59 | Source: Ambulatory Visit | Attending: Radiation Oncology | Admitting: Radiation Oncology

## 2022-06-19 DIAGNOSIS — D0511 Intraductal carcinoma in situ of right breast: Secondary | ICD-10-CM | POA: Diagnosis not present

## 2022-06-19 LAB — RAD ONC ARIA SESSION SUMMARY
Course Elapsed Days: 12
Plan Fractions Treated to Date: 9
Plan Prescribed Dose Per Fraction: 2.66 Gy
Plan Total Fractions Prescribed: 16
Plan Total Prescribed Dose: 42.56 Gy
Reference Point Dosage Given to Date: 23.94 Gy
Reference Point Session Dosage Given: 2.66 Gy
Session Number: 9

## 2022-06-20 ENCOUNTER — Other Ambulatory Visit: Payer: Self-pay

## 2022-06-20 ENCOUNTER — Ambulatory Visit
Admission: RE | Admit: 2022-06-20 | Discharge: 2022-06-20 | Disposition: A | Discharge status: Home / Self Care | Payer: 59 | Source: Ambulatory Visit | Attending: Radiation Oncology | Admitting: Radiation Oncology

## 2022-06-20 DIAGNOSIS — D0511 Intraductal carcinoma in situ of right breast: Secondary | ICD-10-CM | POA: Diagnosis not present

## 2022-06-20 LAB — RAD ONC ARIA SESSION SUMMARY
Course Elapsed Days: 13
Plan Fractions Treated to Date: 10
Plan Prescribed Dose Per Fraction: 2.66 Gy
Plan Total Fractions Prescribed: 16
Plan Total Prescribed Dose: 42.56 Gy
Reference Point Dosage Given to Date: 26.6 Gy
Reference Point Session Dosage Given: 2.66 Gy
Session Number: 10

## 2022-06-21 ENCOUNTER — Ambulatory Visit
Admission: RE | Admit: 2022-06-21 | Discharge: 2022-06-21 | Disposition: A | Discharge status: Home / Self Care | Payer: 59 | Source: Ambulatory Visit | Attending: Radiation Oncology | Admitting: Radiation Oncology

## 2022-06-21 ENCOUNTER — Other Ambulatory Visit: Payer: Self-pay

## 2022-06-21 DIAGNOSIS — D0511 Intraductal carcinoma in situ of right breast: Secondary | ICD-10-CM | POA: Diagnosis not present

## 2022-06-21 LAB — RAD ONC ARIA SESSION SUMMARY
Course Elapsed Days: 14
Plan Fractions Treated to Date: 11
Plan Prescribed Dose Per Fraction: 2.66 Gy
Plan Total Fractions Prescribed: 16
Plan Total Prescribed Dose: 42.56 Gy
Reference Point Dosage Given to Date: 29.26 Gy
Reference Point Session Dosage Given: 2.66 Gy
Session Number: 11

## 2022-06-22 ENCOUNTER — Ambulatory Visit: Payer: 59

## 2022-06-22 ENCOUNTER — Ambulatory Visit
Admission: RE | Admit: 2022-06-22 | Discharge: 2022-06-22 | Disposition: A | Discharge status: Home / Self Care | Payer: 59 | Source: Ambulatory Visit | Attending: Radiation Oncology | Admitting: Radiation Oncology

## 2022-06-22 ENCOUNTER — Other Ambulatory Visit: Payer: Self-pay

## 2022-06-22 DIAGNOSIS — D0511 Intraductal carcinoma in situ of right breast: Secondary | ICD-10-CM | POA: Diagnosis not present

## 2022-06-22 LAB — RAD ONC ARIA SESSION SUMMARY
Course Elapsed Days: 15
Plan Fractions Treated to Date: 12
Plan Prescribed Dose Per Fraction: 2.66 Gy
Plan Total Fractions Prescribed: 16
Plan Total Prescribed Dose: 42.56 Gy
Reference Point Dosage Given to Date: 31.92 Gy
Reference Point Session Dosage Given: 2.66 Gy
Session Number: 12

## 2022-06-23 ENCOUNTER — Other Ambulatory Visit: Payer: Self-pay

## 2022-06-23 ENCOUNTER — Ambulatory Visit: Payer: 59

## 2022-06-23 ENCOUNTER — Ambulatory Visit
Admission: RE | Admit: 2022-06-23 | Discharge: 2022-06-23 | Disposition: A | Discharge status: Home / Self Care | Payer: 59 | Source: Ambulatory Visit | Attending: Radiation Oncology | Admitting: Radiation Oncology

## 2022-06-23 DIAGNOSIS — D0511 Intraductal carcinoma in situ of right breast: Secondary | ICD-10-CM | POA: Diagnosis not present

## 2022-06-23 LAB — RAD ONC ARIA SESSION SUMMARY
Course Elapsed Days: 16
Plan Fractions Treated to Date: 13
Plan Prescribed Dose Per Fraction: 2.66 Gy
Plan Total Fractions Prescribed: 16
Plan Total Prescribed Dose: 42.56 Gy
Reference Point Dosage Given to Date: 34.58 Gy
Reference Point Session Dosage Given: 2.66 Gy
Session Number: 13

## 2022-06-26 ENCOUNTER — Other Ambulatory Visit: Payer: Self-pay

## 2022-06-26 ENCOUNTER — Ambulatory Visit
Admission: RE | Admit: 2022-06-26 | Discharge: 2022-06-26 | Disposition: A | Discharge status: Home / Self Care | Payer: 59 | Source: Ambulatory Visit | Attending: Radiation Oncology | Admitting: Radiation Oncology

## 2022-06-26 DIAGNOSIS — D0511 Intraductal carcinoma in situ of right breast: Secondary | ICD-10-CM | POA: Diagnosis not present

## 2022-06-26 LAB — RAD ONC ARIA SESSION SUMMARY
Course Elapsed Days: 19
Plan Fractions Treated to Date: 14
Plan Prescribed Dose Per Fraction: 2.66 Gy
Plan Total Fractions Prescribed: 16
Plan Total Prescribed Dose: 42.56 Gy
Reference Point Dosage Given to Date: 37.24 Gy
Reference Point Session Dosage Given: 2.66 Gy
Session Number: 14

## 2022-06-27 ENCOUNTER — Ambulatory Visit
Admission: RE | Admit: 2022-06-27 | Discharge: 2022-06-27 | Disposition: A | Discharge status: Home / Self Care | Payer: 59 | Source: Ambulatory Visit | Attending: Radiation Oncology | Admitting: Radiation Oncology

## 2022-06-27 ENCOUNTER — Other Ambulatory Visit: Payer: Self-pay

## 2022-06-27 DIAGNOSIS — D0511 Intraductal carcinoma in situ of right breast: Secondary | ICD-10-CM | POA: Diagnosis not present

## 2022-06-27 LAB — RAD ONC ARIA SESSION SUMMARY
Course Elapsed Days: 20
Plan Fractions Treated to Date: 15
Plan Prescribed Dose Per Fraction: 2.66 Gy
Plan Total Fractions Prescribed: 16
Plan Total Prescribed Dose: 42.56 Gy
Reference Point Dosage Given to Date: 39.9 Gy
Reference Point Session Dosage Given: 2.66 Gy
Session Number: 15

## 2022-06-28 ENCOUNTER — Ambulatory Visit: Admission: RE | Admit: 2022-06-28 | Payer: 59 | Source: Ambulatory Visit

## 2022-06-28 ENCOUNTER — Ambulatory Visit
Admission: RE | Admit: 2022-06-28 | Discharge: 2022-06-28 | Disposition: A | Discharge status: Home / Self Care | Payer: 59 | Source: Ambulatory Visit | Attending: Radiation Oncology | Admitting: Radiation Oncology

## 2022-06-28 ENCOUNTER — Other Ambulatory Visit: Payer: Self-pay

## 2022-06-28 DIAGNOSIS — D0511 Intraductal carcinoma in situ of right breast: Secondary | ICD-10-CM | POA: Diagnosis not present

## 2022-06-28 LAB — RAD ONC ARIA SESSION SUMMARY
Course Elapsed Days: 21
Plan Fractions Treated to Date: 16
Plan Prescribed Dose Per Fraction: 2.66 Gy
Plan Total Fractions Prescribed: 16
Plan Total Prescribed Dose: 42.56 Gy
Reference Point Dosage Given to Date: 42.56 Gy
Reference Point Session Dosage Given: 2.66 Gy
Session Number: 16

## 2022-06-29 ENCOUNTER — Inpatient Hospital Stay: Payer: 59

## 2022-06-29 ENCOUNTER — Other Ambulatory Visit: Payer: Self-pay

## 2022-06-29 ENCOUNTER — Ambulatory Visit
Admission: RE | Admit: 2022-06-29 | Discharge: 2022-06-29 | Disposition: A | Discharge status: Home / Self Care | Payer: 59 | Source: Ambulatory Visit | Attending: Radiation Oncology | Admitting: Radiation Oncology

## 2022-06-29 DIAGNOSIS — Z79899 Other long term (current) drug therapy: Secondary | ICD-10-CM | POA: Insufficient documentation

## 2022-06-29 DIAGNOSIS — D0511 Intraductal carcinoma in situ of right breast: Secondary | ICD-10-CM | POA: Insufficient documentation

## 2022-06-29 DIAGNOSIS — M3390 Dermatopolymyositis, unspecified, organ involvement unspecified: Secondary | ICD-10-CM | POA: Insufficient documentation

## 2022-06-29 DIAGNOSIS — Z7982 Long term (current) use of aspirin: Secondary | ICD-10-CM | POA: Insufficient documentation

## 2022-06-29 DIAGNOSIS — Z923 Personal history of irradiation: Secondary | ICD-10-CM | POA: Insufficient documentation

## 2022-06-29 DIAGNOSIS — Z87891 Personal history of nicotine dependence: Secondary | ICD-10-CM | POA: Insufficient documentation

## 2022-06-29 LAB — RAD ONC ARIA SESSION SUMMARY
Course Elapsed Days: 22
Plan Fractions Treated to Date: 1
Plan Prescribed Dose Per Fraction: 2 Gy
Plan Total Fractions Prescribed: 5
Plan Total Prescribed Dose: 10 Gy
Reference Point Dosage Given to Date: 2 Gy
Reference Point Session Dosage Given: 2 Gy
Session Number: 17

## 2022-06-29 LAB — CBC (CANCER CENTER ONLY)
HCT: 43.6 % (ref 36.0–46.0)
Hemoglobin: 13.8 g/dL (ref 12.0–15.0)
MCH: 26.8 pg (ref 26.0–34.0)
MCHC: 31.7 g/dL (ref 30.0–36.0)
MCV: 84.7 fL (ref 80.0–100.0)
Platelet Count: 213 10*3/uL (ref 150–400)
RBC: 5.15 MIL/uL — ABNORMAL HIGH (ref 3.87–5.11)
RDW: 14.6 % (ref 11.5–15.5)
WBC Count: 6.3 10*3/uL (ref 4.0–10.5)
nRBC: 0 % (ref 0.0–0.2)

## 2022-06-30 ENCOUNTER — Ambulatory Visit
Admission: RE | Admit: 2022-06-30 | Discharge: 2022-06-30 | Disposition: A | Discharge status: Home / Self Care | Payer: 59 | Source: Ambulatory Visit | Attending: Radiation Oncology | Admitting: Radiation Oncology

## 2022-06-30 ENCOUNTER — Other Ambulatory Visit: Payer: Self-pay

## 2022-06-30 DIAGNOSIS — D0511 Intraductal carcinoma in situ of right breast: Secondary | ICD-10-CM | POA: Diagnosis not present

## 2022-06-30 LAB — RAD ONC ARIA SESSION SUMMARY
Course Elapsed Days: 23
Plan Fractions Treated to Date: 2
Plan Prescribed Dose Per Fraction: 2 Gy
Plan Total Fractions Prescribed: 5
Plan Total Prescribed Dose: 10 Gy
Reference Point Dosage Given to Date: 4 Gy
Reference Point Session Dosage Given: 2 Gy
Session Number: 18

## 2022-07-03 ENCOUNTER — Other Ambulatory Visit: Payer: Self-pay

## 2022-07-03 ENCOUNTER — Ambulatory Visit
Admission: RE | Admit: 2022-07-03 | Discharge: 2022-07-03 | Disposition: A | Discharge status: Home / Self Care | Payer: 59 | Source: Ambulatory Visit | Attending: Radiation Oncology | Admitting: Radiation Oncology

## 2022-07-03 DIAGNOSIS — D0511 Intraductal carcinoma in situ of right breast: Secondary | ICD-10-CM | POA: Diagnosis not present

## 2022-07-03 LAB — RAD ONC ARIA SESSION SUMMARY
Course Elapsed Days: 26
Plan Fractions Treated to Date: 3
Plan Prescribed Dose Per Fraction: 2 Gy
Plan Total Fractions Prescribed: 5
Plan Total Prescribed Dose: 10 Gy
Reference Point Dosage Given to Date: 6 Gy
Reference Point Session Dosage Given: 2 Gy
Session Number: 19

## 2022-07-04 ENCOUNTER — Other Ambulatory Visit: Payer: Self-pay

## 2022-07-04 ENCOUNTER — Other Ambulatory Visit: Payer: Self-pay | Admitting: *Deleted

## 2022-07-04 ENCOUNTER — Ambulatory Visit
Admission: RE | Admit: 2022-07-04 | Discharge: 2022-07-04 | Disposition: A | Discharge status: Home / Self Care | Payer: 59 | Source: Ambulatory Visit | Attending: Radiation Oncology | Admitting: Radiation Oncology

## 2022-07-04 DIAGNOSIS — D0511 Intraductal carcinoma in situ of right breast: Secondary | ICD-10-CM | POA: Diagnosis not present

## 2022-07-04 LAB — RAD ONC ARIA SESSION SUMMARY
Course Elapsed Days: 27
Plan Fractions Treated to Date: 4
Plan Prescribed Dose Per Fraction: 2 Gy
Plan Total Fractions Prescribed: 5
Plan Total Prescribed Dose: 10 Gy
Reference Point Dosage Given to Date: 8 Gy
Reference Point Session Dosage Given: 2 Gy
Session Number: 20

## 2022-07-04 MED ORDER — SILVER SULFADIAZINE 1 % EX CREA: 1.0000 | TOPICAL_CREAM | Freq: Two times a day (BID) | CUTANEOUS | 0 refills | Status: DC

## 2022-07-04 MED ORDER — SILVER SULFADIAZINE 1 % EX CREA: 1.0000 | TOPICAL_CREAM | Freq: Two times a day (BID) | CUTANEOUS | 3 refills | Status: AC

## 2022-07-05 ENCOUNTER — Other Ambulatory Visit: Payer: Self-pay

## 2022-07-05 ENCOUNTER — Ambulatory Visit
Admission: RE | Admit: 2022-07-05 | Discharge: 2022-07-05 | Disposition: A | Discharge status: Home / Self Care | Payer: 59 | Source: Ambulatory Visit | Attending: Radiation Oncology | Admitting: Radiation Oncology

## 2022-07-05 ENCOUNTER — Encounter: Payer: Self-pay | Admitting: Oncology

## 2022-07-05 ENCOUNTER — Other Ambulatory Visit: Payer: Self-pay | Admitting: *Deleted

## 2022-07-05 ENCOUNTER — Inpatient Hospital Stay (HOSPITAL_BASED_OUTPATIENT_CLINIC_OR_DEPARTMENT_OTHER): Payer: 59 | Admitting: Oncology

## 2022-07-05 ENCOUNTER — Encounter: Payer: Self-pay | Admitting: *Deleted

## 2022-07-05 VITALS — BP 125/48 | HR 78 | Temp 97.5°F | Resp 16 | Ht 64.0 in | Wt 254.0 lb

## 2022-07-05 DIAGNOSIS — D0511 Intraductal carcinoma in situ of right breast: Secondary | ICD-10-CM

## 2022-07-05 LAB — RAD ONC ARIA SESSION SUMMARY
Course Elapsed Days: 28
Plan Fractions Treated to Date: 5
Plan Prescribed Dose Per Fraction: 2 Gy
Plan Total Fractions Prescribed: 5
Plan Total Prescribed Dose: 10 Gy
Reference Point Dosage Given to Date: 10 Gy
Reference Point Session Dosage Given: 2 Gy
Session Number: 21

## 2022-07-05 MED ORDER — LETROZOLE 2.5 MG PO TABS: 2.5000 mg | ORAL_TABLET | Freq: Every day | ORAL | 3 refills | Status: DC

## 2022-07-05 NOTE — Progress Notes (Signed)
[facility]  [phone] [phone]  ID: [name] [name] OB: 1955-05-30  MR#: [id number]  UXL#:[id number]  Patient Care Team: Hillery Aldo, MD as PCP - General (Family Medicine) Debbe Odea, MD as PCP - Cardiology (Cardiology) Delma Freeze, FNP as Nurse Practitioner (Family Medicine) Mertie Moores, MD as Consulting Physician (Pulmonary Disease) Lady Gary Darlin Priestly, MD as Consulting Physician (Cardiology) Hulen Luster, RN as Oncology Nurse Navigator  CHIEF COMPLAINT: DCIS, right breast.  INTERVAL HISTORY: Patient returns to clinic today for further evaluation at the end of her XRT and to initiate letrozole.  She tolerated treatments well with only some mild discomfort. She has no neurologic complaints.  She denies any recent fevers or illnesses.  She has a good appetite and denies weight loss.  She has no chest pain, shortness of breath, cough, or hemoptysis.  She denies any nausea, vomiting, constipation, or diarrhea.  She has no urinary complaints.  Patient offers no specific complaints today.  REVIEW OF SYSTEMS:   Review of Systems  Constitutional: Negative.  Negative for fever, malaise/fatigue and weight loss.  Respiratory: Negative.  Negative for cough, hemoptysis and shortness of breath.   Cardiovascular: Negative.  Negative for chest pain and leg swelling.  Gastrointestinal: Negative.  Negative for abdominal pain.  Genitourinary: Negative.  Negative for dysuria.  Musculoskeletal: Negative.  Negative for back pain.  Skin: Negative.  Negative for rash.  Neurological: Negative.  Negative for dizziness, focal weakness, weakness and headaches.  Psychiatric/Behavioral: Negative.  The patient is not nervous/anxious.     As per HPI. Otherwise, a complete review of systems is negative.  PAST MEDICAL HISTORY: Past Medical History:  Diagnosis Date   (HFpEF) heart failure with preserved ejection fraction    a.) TTE 05/21/2016: EF 50%, mild  LV dil, ant HK, mild RAE, G1DD; b.) TTE 06/06/2019: EF 60-65%, G2DD; c.) TTE 04/04/2021: EF 60-65%, mod LAE, mild RAE, mod MAC, mild MV stenosis, G2DD   Allergic rhinitis    Allergy    Aortic atherosclerosis    Asthma    Chronic back pain    COPD (chronic obstructive pulmonary disease)    Degenerative joint disease of knee, left    Depression    Diverticulosis    Dyspnea    Edema    GERD (gastroesophageal reflux disease)    Headache    Hidradenitis    History of bilateral cataract extraction    History of colonic polyps    HLD (hyperlipidemia)    Hypertension    Hypothyroidism    IBS (irritable bowel syndrome)    IDA (iron deficiency anemia)    Insomnia    a.) takes trazodone PRN   Intraductal papilloma of breast, right    Mitral valve stenosis 04/04/2021   a.) TTE 04/04/2021: mild MS (MPG 5.0 mm Hg).   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    Sinusitis, chronic    T2DM (type 2 diabetes mellitus)    Vaginitis, atrophic    Vertigo    Vitamin D deficiency     PAST SURGICAL HISTORY: Past Surgical History:  Procedure Laterality Date   ABDOMINAL HYSTERECTOMY     AXILLARY HIDRADENITIS EXCISION     BREAST  BIOPSY Right 02/22/2022   stereo bx, calcs, "X" clip- ATYPICAL INTRADUCTAL PAPILLARY PROLIFERATION WITH SCLEROSIS AND CALCIFICATION PROLIFERATION WITH SCLEROSIS AND CALCIFICATION   BREAST BIOPSY Right 02/22/2022   MM RT BREAST BX W LOC DEV 1ST LESION IMAGE BX SPEC STEREO GUIDE 02/22/2022 [facility]   BREAST BIOPSY Right 04/04/2022   rt br calcs coil clip, PASH   BREAST BIOPSY Right 04/04/2022   MM RT BREAST BX W LOC DEV 1ST LESION IMAGE BX SPEC STEREO GUIDE 04/04/2022 [facility]   BREAST BIOPSY Right 04/26/2022   MM RT RADIO FREQUENCY  TAG EA ADD LESION LOC MAMMO GUIDE 04/26/2022 [facility]   BREAST BIOPSY Right 04/26/2022   MM RT RADIO FREQUENCY TAG LOC MAMMO GUIDE 04/26/2022 [facility]   BREAST EXCISIONAL BIOPSY Bilateral 12/2015   RUPTURED FOLLICULAR CYSTS WITH ABSCESSES AND SCARRING, CONSISTENT WITH HIDRADENITIS SUPPURATIVA.    BREAST LUMPECTOMY WITH RADIOFREQUENCY TAG IDENTIFICATION Right 05/09/2022   Procedure: BREAST LUMPECTOMY WITH RADIOFREQUENCY TAG IDENTIFICATION;  Surgeon: Henrene Dodge, MD;  Location: [facility];  Service: General;  Laterality: Right;   CATARACT EXTRACTION W/PHACO Right 07/27/2020   Procedure: CATARACT EXTRACTION PHACO AND INTRAOCULAR LENS PLACEMENT (IOC) RIGHT DIABETIC 17.13 01:23.9;  Surgeon: Galen Manila, MD;  Location: [facility];  Service: Ophthalmology;  Laterality: Right   CATARACT EXTRACTION W/PHACO Left 04/11/2022   Procedure: CATARACT EXTRACTION PHACO AND INTRAOCULAR LENS PLACEMENT (IOC) LEFT DIABETIC 15.05 01:18.6;  Surgeon: Galen Manila, MD;  Location: [facility];  Service: Ophthalmology;  Laterality: Left   CESAREAN SECTION     x 2   CHOLECYSTECTOMY     COLONOSCOPY WITH PROPOFOL N/A 09/15/2021   Procedure: COLONOSCOPY WITH PROPOFOL;  Surgeon: Toney Reil, MD;  Location: [facility];  Service: Gastroenterology;  Laterality: N/A;   HEEL SPUR EXCISION N/A    HYDRADENITIS EXCISION Right 12/31/2015   Procedure: EXCISION HIDRADENITIS AXILLA;  Surgeon: Ricarda Frame, MD;  Location: [facility];  Service: General;  Laterality: Right;   KNEE SURGERY Left    1998   TONSILLECTOMY      FAMILY HISTORY: Family History  Problem Relation Age of Onset   Rashes / Skin problems Father    Hypertension Father    Heart disease Father    Breast cancer Paternal Grandmother    COPD Mother    Kidney cancer Neg Hx    Bladder Cancer Neg Hx     ADVANCED DIRECTIVES (Y/N):  N  HEALTH MAINTENANCE: Social History   Tobacco Use   Smoking status: Former     Packs/day: 3.00    Years: 20.00    Additional pack years: 0.00    Total pack years: 60.00    Types: Cigarettes    Quit date: 12/18/1993    Years since quitting: 28.5   Smokeless tobacco: Never  Vaping Use   Vaping Use: Never used  Substance Use Topics   Alcohol use: No   Drug use: No    Types: Marijuana     Colonoscopy:  PAP:  Bone density:  Lipid panel:  Allergies  Allergen Reactions   Codeine Itching   Levofloxacin Rash    "welts" & itching    Current Outpatient Medications  Medication Sig Dispense Refill   acetaminophen (TYLENOL) 500 MG tablet Take 500 mg by mouth every 6 (six) hours as needed.     acetaminophen (TYLENOL) 500 MG tablet Take 2 tablets (1,000 mg total) by mouth every 6 (six) hours as needed for mild pain.     albuterol (PROVENTIL HFA;VENTOLIN HFA) 108 (  90 Base) MCG/ACT inhaler Inhale 2 puffs into the lungs every 6 (six) hours as needed for wheezing or shortness of breath.     aspirin EC 81 MG tablet Take 81 mg by mouth daily.     atorvastatin (LIPITOR) 20 MG tablet Take 20 mg by mouth at bedtime.      BIOTIN MAXIMUM PO Take by mouth daily.     budesonide-formoterol (SYMBICORT) 160-4.5 MCG/ACT inhaler Inhale 2 puffs into the lungs 2 (two) times daily.     clobetasol (OLUX) 0.05 % topical foam Apply topically 2 (two) times daily. 50 g 0   doxepin (SINEQUAN) 50 MG capsule Take 50 mg by mouth at bedtime.     DULoxetine (CYMBALTA) 20 MG capsule Take 40 mg by mouth daily.     empagliflozin (JARDIANCE) 10 MG TABS tablet Take 25 mg by mouth daily.     fluticasone (FLONASE) 50 MCG/ACT nasal spray as needed.     gabapentin (NEURONTIN) 300 MG capsule Take 600 mg by mouth 2 (two) times daily.  0   glucose blood test strip TEST three times a day     ibuprofen (ADVIL) 800 MG tablet Take 1 tablet (800 mg total) by mouth every 8 (eight) hours as needed for moderate pain. 60 tablet 1   ibuprofen (ADVIL,MOTRIN) 800 MG tablet Take 1 tablet (800 mg total) by mouth every 8  (eight) hours as needed for mild pain or moderate pain (with food). 20 tablet 0   ipratropium (ATROVENT HFA) 17 MCG/ACT inhaler Inhale 2 puffs into the lungs every 6 (six) hours as needed for wheezing. Patient uses once a day     ipratropium-albuterol (DUONEB) 0.5-2.5 (3) MG/3ML SOLN Take 3 mLs by nebulization every 6 (six) hours as needed. 360 mL 10   ketoconazole (NIZORAL) 2 % shampoo Shampoo into scalp let sit 10 minutes then wash out. Use 3d/wk. 120 mL 11   Lancets Misc. (ACCU-CHEK FASTCLIX LANCET) KIT      levocetirizine (XYZAL) 5 MG tablet TAKE 1 TABLET BY MOUTH EVERY EVENING 30 tablet 5   levothyroxine (SYNTHROID, LEVOTHROID) 200 MCG tablet Take 175 mcg by mouth daily before breakfast.  1   Melatonin 5 MG CAPS Take 2 capsules by mouth daily.     meloxicam (MOBIC) 7.5 MG tablet Take 7.5 mg by mouth daily.     olopatadine (PATANOL) 0.1 % ophthalmic solution Place 1 drop into both eyes 2 (two) times daily.     omeprazole (PRILOSEC) 40 MG capsule Take 40 mg by mouth daily.     OXYGEN Inhale into the lungs. With CPAP     potassium citrate (UROCIT-K) 10 MEQ (1080 MG) SR tablet TAKE ONE TABLET BY MOUTH EVERY MORNING 90 tablet 3   psyllium (METAMUCIL) 58.6 % powder Take 1 packet by mouth as needed.     roflumilast (DALIRESP) 500 MCG TABS tablet Take 500 mcg by mouth daily.     silver sulfADIAZINE (SILVADENE) 1 % cream Apply 1 Application topically 2 (two) times daily. 50 g 3   tirzepatide (MOUNJARO) 5 MG/0.5ML Pen Inject 5 mg into the skin once a week. Saturday     torsemide (DEMADEX) 20 MG tablet TAKE ONE TABLET BY MOUTH DAILY 90 tablet 3   traZODone (DESYREL) 50 MG tablet Take 50-100 mg by mouth at bedtime. Takes 150 mg     No current facility-administered medications for this visit.    OBJECTIVE: Vitals:   07/05/22 1056  BP: (!) 125/48  Pulse: 78  Resp:  16  Temp: (!) 97.5 F (36.4 C)  SpO2: 97%     Body mass index is 43.6 kg/m.    ECOG FS:0 - Asymptomatic  General:  Well-developed, well-nourished, no acute distress.  Sitting in a wheelchair. Eyes: Pink conjunctiva, anicteric sclera. HEENT: Normocephalic, moist mucous membranes. Lungs: No audible wheezing or coughing. Heart: Regular rate and rhythm. Abdomen: Soft, nontender, no obvious distention. Musculoskeletal: No edema, cyanosis, or clubbing. Neuro: Alert, answering all questions appropriately. Cranial nerves grossly intact. Skin: No rashes or petechiae noted. Psych: Normal affect.  LAB RESULTS:  Lab Results  Component Value Date   NA 132 (L) 05/12/2021   K 4.4 05/12/2021   CL 98 05/12/2021   CO2 25 05/12/2021   GLUCOSE 195 (H) 05/12/2021   BUN 33 (H) 05/12/2021   CREATININE 1.16 (H) 05/12/2021   CALCIUM 9.2 05/12/2021   PROT 7.4 06/25/2017   ALBUMIN 3.4 (L) 06/25/2017   AST 19 06/25/2017   ALT 11 (L) 06/25/2017   ALKPHOS 122 06/25/2017   BILITOT 0.6 06/25/2017   GFRNONAA 53 (L) 05/12/2021   GFRAA >60 09/29/2019    Lab Results  Component Value Date   WBC 6.3 06/29/2022   NEUTROABS 17.3 (H) 10/07/2019   HGB 13.8 06/29/2022   HCT 43.6 06/29/2022   MCV 84.7 06/29/2022   PLT 213 06/29/2022     STUDIES: No results found.  ASSESSMENT: DCIS, right breast.  PLAN:    DCIS, right breast: Pathology reviewed independently.  Patient underwent lumpectomy on May 09, 2022.  Given the noninvasive nature of her pathology, she did not require adjuvant chemotherapy.  She completed adjuvant XRT on July 05, 2022.  Because of interaction with Cymbalta, patient cannot take tamoxifen therefore was given a prescription for letrozole which she will take for a total of 5 years completing treatment in April 2029.  Return to clinic in 3 months for routine evaluation.  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 Bone health: Will get a baseline bone mineral density in the next several weeks.   Patient expressed understanding and was in agreement with this plan. She  also understands that She can call clinic at any time with any questions, concerns, or complaints.    Cancer Staging  Ductal carcinoma in situ (DCIS) of right breast Staging form: Breast, AJCC 8th Edition - Clinical stage from 05/25/2022: Stage 0 (cTis (DCIS), cN0, cM0, ER+, PR: Not Assessed, HER2: Not Assessed) - Signed by Jeralyn Ruths, MD on 05/25/2022 Stage prefix: Initial diagnosis Nuclear grade: G2   Jeralyn Ruths, MD   07/05/2022 11:18 AM

## 2022-07-10 ENCOUNTER — Telehealth: Payer: Self-pay | Admitting: *Deleted

## 2022-07-10 ENCOUNTER — Ambulatory Visit: Payer: 59 | Admitting: Radiation Oncology

## 2022-07-10 NOTE — Telephone Encounter (Signed)
Per v/o Dr. Donneta Romberg- attempted to reach patient to schedule apt in [facility] to evaluate radiation skin changes.  Pt called back and already has an apt with Dr. Marena Chancy at [phone]. She wanted to change the time to the afternoon. Radiation RNs provided new apt time for patient at 2pm. Pt aware of new apt

## 2022-07-11 ENCOUNTER — Encounter: Payer: Self-pay | Admitting: Radiation Oncology

## 2022-07-11 ENCOUNTER — Ambulatory Visit
Admission: RE | Admit: 2022-07-11 | Discharge: 2022-07-11 | Disposition: A | Discharge status: Home / Self Care | Payer: 59 | Source: Ambulatory Visit | Attending: Radiation Oncology | Admitting: Radiation Oncology

## 2022-07-11 VITALS — BP 131/69 | HR 73 | Temp 97.6°F | Resp 16

## 2022-07-11 DIAGNOSIS — D0511 Intraductal carcinoma in situ of right breast: Secondary | ICD-10-CM | POA: Diagnosis not present

## 2022-07-11 NOTE — Progress Notes (Signed)
Radiation Oncology Follow up Note  Name: [name] [name]   Date:   07/11/2022 MRN:  [id number] DOB: [birth year]    This 67 y.o. female presents to the clinic today for skin evaluation and patient recently completing radiation therapy to her right breast for ductal carcinoma in situ.  REFERRING PROVIDER: Hillery Aldo, MD  HPI: Patient seen today at her own request she is having some slight desquamation of skin in the inframammary fold having just completed whole breast radiation to her right breast for ER positive ductal carcinoma in situ.  COMPLICATIONS OF TREATMENT: none  FOLLOW UP COMPLIANCE: keeps appointments   PHYSICAL EXAM:  BP 131/69 (BP Location: Right Wrist, Patient Position: Sitting, Cuff Size: Small)   Pulse 73   Temp 97.6 F (36.4 C)   Resp 16  Patient has erythematous change in the skin of the right breast and small area of the dry desquamation in the inframammary fold.  RADIOLOGY RESULTS: No current films for review  PLAN: At this time I have encouraged her that her skin is healing well.  She will start some Benadryl gel to the areas of redness of her breast.  She will also continue Silvadene cream to inframammary fold.  Will see her back in 1 month for follow-up.  Patient is to call if things do not improve.  I would like to take this opportunity to thank you for allowing me to participate in the care of your patient.Carmina Miller, MD

## 2022-07-31 ENCOUNTER — Telehealth: Payer: Self-pay

## 2022-07-31 NOTE — Telephone Encounter (Signed)
Patient c/o excessively dry skin after finishing radiation treatments, and wanted to know what your recommendations are. Please advise.

## 2022-08-02 NOTE — Telephone Encounter (Signed)
No answer at phone number listed in chart.

## 2022-08-02 NOTE — Telephone Encounter (Signed)
Is she able to send me a picture of any of the dry area on mychart? It would help to know if it is just dryness or if she also has some inflammation there. Thank you!

## 2022-08-08 NOTE — Telephone Encounter (Signed)
Left message on voicemail to return my call.  

## 2022-08-09 NOTE — Telephone Encounter (Signed)
Called patient 08/08/22 and left message. Patient returned call but had to leave nurse VM. Called patient back this morning but unable to leave message. The phone just continued to ring. aw

## 2022-08-15 ENCOUNTER — Encounter: Payer: Medicare Other | Admitting: Family

## 2022-08-15 ENCOUNTER — Telehealth: Payer: Self-pay | Admitting: Family

## 2022-08-15 ENCOUNTER — Other Ambulatory Visit: Payer: Self-pay | Admitting: Dermatology

## 2022-08-15 DIAGNOSIS — L299 Pruritus, unspecified: Secondary | ICD-10-CM

## 2022-08-15 NOTE — Telephone Encounter (Signed)
Patient did not show for her [facility] Failure [facility] appointment on 08/15/22.  

## 2022-08-15 NOTE — Telephone Encounter (Signed)
No answer, phone line kept ringing.

## 2022-08-15 NOTE — Progress Notes (Deleted)
PCP: Primary Cardiologist:  HPI:   [name] [name] is a 67 y/o female with a history of vitamin D deficiency, obstructive sleep apnea, GERD, pneumonia, PVD, DM, depression, DJD, COPD with oxygen, allergies, past tobacco use and chronic heart failure.   Echo report on 04/04/21 reviewed and showed an EF of 60-65%, Left atrial size was moderately dilated. Echo report on 06/06/19 reviewed and showed an EF of 60-65%. Echo done on 05/21/16 and showed an EF of 50% along with trivial MR/TR/PR. EF has declined slightly from June 2017.  Has not been admitted or been in the ED in the last 6 months.   She presents today for a follow up appointment with a chief complaint of minimal fatigue upon moderate exertion. Describes this as chronic in nature having been present for several years. She has associated shortness of breath, pedal edema (left lower leg), light-headedness (all the time) & knee pain along with this. She denies any difficulty sleeping, abdominal distention, palpitations, chest pain, wheezing, cough or weight gain.   Says that she fell twice today getting out of the bathtub. She says that her feet were still wet when she stepped on the bathmat but slipped when she then stepped onto a towel that she has beside the bathmat leading towards the door. Denies losing consciousness or hitting her head. Says that she fell onto her knees.   Says that she knows that she's drinking too much fluids but that her mouth stays dry "all the time". She says that she drinks 4-6 water bottles daily along with apple juice.   She is unsure if she's still taking jardiance or not.     ROS: All systems negative except as listed in HPI, PMH and Problem List.  SH:  Social History   Socioeconomic History   Marital status: Divorced    Spouse name: Not on file   Number of children: Not on file   Years of education: Not on file   Highest education level: Not on file  Occupational History   Occupation: disabled  Tobacco Use    Smoking status: Former    Packs/day: 3.00    Years: 20.00    Additional pack years: 0.00    Total pack years: 60.00    Types: Cigarettes    Quit date: 12/18/1993    Years since quitting: 28.6   Smokeless tobacco: Never  Vaping Use   Vaping Use: Never used  Substance and Sexual Activity   Alcohol use: No   Drug use: No    Types: Marijuana   Sexual activity: Never    Birth control/protection: Abstinence  Other Topics Concern   Not on file  Social History Narrative   Lives alone   Social Determinants of Health   Financial Resource Strain: Low Risk  (03/09/2017)   Overall Financial Resource Strain (CARDIA)    Difficulty of Paying Living Expenses: Not hard at all  Food Insecurity: Unknown (03/09/2017)   Hunger Vital Sign    Worried About Running Out of Food in the Last Year: Patient declined    Ran Out of Food in the Last Year: Patient declined  Transportation Needs: Unknown (03/09/2017)   PRAPARE - Administrator, Civil Service (Medical): Patient declined    Lack of Transportation (Non-Medical): Patient declined  Physical Activity: Unknown (03/09/2017)   Exercise Vital Sign    Days of Exercise per Week: Patient declined    Minutes of Exercise per Session: Patient declined  Stress: No Stress Concern Present (  03/09/2017)   [employer] of Occupational Health - Occupational Stress Questionnaire    Feeling of Stress : Not at all  Social Connections: Somewhat Isolated (03/09/2017)   Social Connection and Isolation Panel [NHANES]    Frequency of Communication with Friends and Family: More than three times a week    Frequency of Social Gatherings with Friends and Family: Once a week    Attends Religious Services: More than 4 times per year    Active Member of [employer] or Organizations: No    Attends Banker Meetings: Never    Marital Status: Divorced  Catering manager Violence: Unknown (03/09/2017)   Humiliation, Afraid, Rape, and Kick questionnaire     Fear of Current or Ex-Partner: Patient declined    Emotionally Abused: Patient declined    Physically Abused: Patient declined    Sexually Abused: Patient declined    FH:  Family History  Problem Relation Age of Onset   Rashes / Skin problems Father    Hypertension Father    Heart disease Father    Breast cancer Paternal Grandmother    COPD Mother    Kidney cancer Neg Hx    Bladder Cancer Neg Hx     Past Medical History:  Diagnosis Date   (HFpEF) heart failure with preserved ejection fraction (HCC)    a.) TTE 05/21/2016: EF 50%, mild LV dil, ant HK, mild RAE, G1DD; b.) TTE 06/06/2019: EF 60-65%, G2DD; c.) TTE 04/04/2021: EF 60-65%, mod LAE, mild RAE, mod MAC, mild MV stenosis, G2DD   Allergic rhinitis    Allergy    Aortic atherosclerosis (HCC)    Asthma    Chronic back pain    COPD (chronic obstructive pulmonary disease) (HCC)    Degenerative joint disease of knee, left    Depression    Diverticulosis    Dyspnea    Edema    GERD (gastroesophageal reflux disease)    Headache    Hidradenitis    History of bilateral cataract extraction    History of colonic polyps    HLD (hyperlipidemia)    Hypertension    Hypothyroidism    IBS (irritable bowel syndrome)    IDA (iron deficiency anemia)    Insomnia    a.) takes trazodone PRN   Intraductal papilloma of breast, right    Mitral valve stenosis 04/04/2021   a.) TTE 04/04/2021: mild [name] (MPG 5.0 mm Hg).   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     acetaminophen (TYLENOL) 500 MG tablet Take 2 tablets (1,000 mg total) by mouth every 6 (six) hours as needed for mild pain.     albuterol (PROVENTIL HFA;VENTOLIN HFA) 108 (90 Base) MCG/ACT inhaler Inhale 2 puffs into the lungs every 6 (six) hours as needed for wheezing or shortness of breath.     aspirin EC 81 MG tablet Take 81 mg  by mouth daily.     atorvastatin (LIPITOR) 20 MG tablet Take 20 mg by mouth at bedtime.      BIOTIN MAXIMUM PO Take by mouth daily.     budesonide-formoterol (SYMBICORT) 160-4.5 MCG/ACT inhaler Inhale 2 puffs into the lungs 2 (two) times daily.     clobetasol (OLUX) 0.05 % topical foam Apply topically 2 (two) times daily. 50 g 0   doxepin (SINEQUAN) 50 MG capsule Take 50 mg by mouth at bedtime.     DULoxetine (CYMBALTA) 20 MG capsule Take 40 mg by mouth daily.     empagliflozin (JARDIANCE) 10 MG TABS tablet Take 25 mg by mouth daily.     fluticasone (FLONASE) 50 MCG/ACT nasal spray as needed.     gabapentin (NEURONTIN) 300 MG capsule Take 600 mg by mouth 2 (two) times daily.  0   glucose blood test strip TEST three times a day     ibuprofen (ADVIL) 800 MG tablet Take 1 tablet (800 mg total) by mouth every 8 (eight) hours as needed for moderate pain. 60 tablet 1   ibuprofen (ADVIL,MOTRIN) 800 MG tablet Take 1 tablet (800 mg total) by mouth every 8 (eight) hours as needed for mild pain or moderate pain (with food). 20 tablet 0   ipratropium (ATROVENT HFA) 17 MCG/ACT inhaler Inhale 2 puffs into the lungs every 6 (six) hours as needed for wheezing. Patient uses once a day     ipratropium-albuterol (DUONEB) 0.5-2.5 (3) MG/3ML SOLN Take 3 mLs by nebulization every 6 (six) hours as needed. 360 mL 10   ketoconazole (NIZORAL) 2 % shampoo Shampoo into scalp let sit 10 minutes then wash out. Use 3d/wk. 120 mL 11   Lancets Misc. (ACCU-CHEK FASTCLIX LANCET) KIT      letrozole (FEMARA) 2.5 MG tablet Take 1 tablet (2.5 mg total) by mouth daily.  90 tablet 3   levocetirizine (XYZAL) 5 MG tablet TAKE 1 TABLET BY MOUTH EVERY EVENING 30 tablet 5   levothyroxine (SYNTHROID, LEVOTHROID) 200 MCG tablet Take 175 mcg by mouth daily before breakfast.  1   Melatonin 5 MG CAPS Take 2 capsules by mouth daily.     meloxicam (MOBIC) 7.5 MG tablet Take 7.5 mg by mouth daily.     olopatadine (PATANOL) 0.1 % ophthalmic solution Place 1 drop into both eyes 2 (two) times daily.     omeprazole (PRILOSEC) 40 MG capsule Take 40 mg by mouth daily.     OXYGEN Inhale into the lungs. With CPAP     potassium citrate (UROCIT-K) 10 MEQ (1080 MG) SR tablet TAKE ONE TABLET BY MOUTH EVERY MORNING 90 tablet 3   psyllium (METAMUCIL) 58.6 % powder Take 1 packet by mouth as needed.     roflumilast (DALIRESP) 500 MCG TABS tablet Take 500 mcg by mouth daily.     silver sulfADIAZINE (SILVADENE) 1 % cream Apply 1 Application topically 2 (two) times daily. 50 g 3   tirzepatide (MOUNJARO) 5 MG/0.5ML Pen Inject 5 mg into the skin once a week. Saturday     torsemide (DEMADEX) 20 MG tablet TAKE ONE TABLET BY MOUTH DAILY 90 tablet 3   traZODone (DESYREL) 50 MG tablet Take 50-100 mg by mouth at bedtime. Takes 150 mg     No current facility-administered medications for this visit.      PHYSICAL EXAM:  General:  Well appearing. No resp difficulty HEENT: normal Neck: supple. JVP flat. Carotids 2+ bilaterally; no bruits. No lymphadenopathy or thryomegaly appreciated. Cor: PMI  normal. Regular rate & rhythm. No rubs, gallops or murmurs. Lungs: clear Abdomen: soft, nontender, nondistended. No hepatosplenomegaly. No bruits or masses. Good bowel sounds. Extremities: no cyanosis, clubbing, rash, edema Neuro: alert & orientedx3, cranial nerves grossly intact. Moves all 4 extremities w/o difficulty. Affect pleasant.   ECG:   ASSESSMENT & PLAN:  1: Chronic heart failure with preserved ejection fraction with structural changes- - NYHA class II - euvolemic today - weighing  daily; reminded to call for an overnight weight gain of 2 lbs or a weekly weight gain of 5 lbs - weight unchanged from last visit here 7 months ago - not adding salt and is using a salt substitute. Using meals on wheels for food; reminded her to follow a 2000 mg sodium diet - saw cardiology (Agbor-Etang) 01/13/22 - she is unsure if she's still taking jardiance; she says that she will call back after she looks when she returns home - drinking 64-96 ounces of water daily along with apple juice; reviewed the importance of keeping daily fluid intake to 60-64 ounces total and to talk with PCP about options for her antidepressant that may have less effect on her dry mouth - entresto has been stopped due to hypotension - wearing CPAP nightly - BNP 05/20/16 was 36.0  2: HTN- - BP looks good (139/50) - follows with PCP at [facility]  - BMP from 05/12/21 reviewed and showed sodium 132, potassium 4.4, creatinine 1.16 and GFR 53  3: Diabetes- - A1C on 05/21/16 was 7.0% - saw endocrinologist (Solum) 09/12/17   4: COPD- - confirms rinsing/ spitting after using her inhalers - wears oxygen at 2L at bedtime - saw pulmonologist (Kasa) 01/25/22 - wearing CPAP nightly  Had 2 falls at home earlier today after stepping out of the tub onto a bathmat but then onto a towel that is laying on the floor. Emphasized that she pick up the towel and that she should only have a mat that has a nonstick surface to the back of it. She could also use rubber soled bath shoes/ crocs that were nonslip   Patient did not bring her medications nor a list. Each medication was verbally reviewed with the patient and she was encouraged to bring the bottles to every visit to confirm accuracy of list.  Return in 6 months, sooner if needed.

## 2022-08-17 ENCOUNTER — Other Ambulatory Visit: Payer: Self-pay | Admitting: Dermatology

## 2022-08-17 DIAGNOSIS — R21 Rash and other nonspecific skin eruption: Secondary | ICD-10-CM

## 2022-08-17 NOTE — Telephone Encounter (Signed)
I spoke with patient and she doesn't use MyChart and isn't familiar with it. Please advise.

## 2022-08-17 NOTE — Telephone Encounter (Signed)
I would recommend trying aquaphor a couple of times a day. If it is very itchy, we could send in triamcinolone ointment to use twice a day for a week to see if it helps. Otherwise I would recommend checking it in clinic when she sees Dr. Katrinka Blazing or sooner if she is concerned. Thank you!

## 2022-08-21 ENCOUNTER — Ambulatory Visit
Admission: RE | Admit: 2022-08-21 | Discharge: 2022-08-21 | Disposition: A | Discharge status: Home / Self Care | Payer: 59 | Source: Ambulatory Visit | Attending: Radiation Oncology | Admitting: Radiation Oncology

## 2022-08-21 VITALS — BP 102/53 | HR 84 | Temp 98.3°F | Resp 16 | Ht 64.0 in

## 2022-08-21 DIAGNOSIS — Z79811 Long term (current) use of aromatase inhibitors: Secondary | ICD-10-CM | POA: Insufficient documentation

## 2022-08-21 DIAGNOSIS — Z170 Estrogen receptor positive status [ER+]: Secondary | ICD-10-CM | POA: Diagnosis not present

## 2022-08-21 DIAGNOSIS — D0511 Intraductal carcinoma in situ of right breast: Secondary | ICD-10-CM | POA: Insufficient documentation

## 2022-08-21 DIAGNOSIS — R232 Flushing: Secondary | ICD-10-CM | POA: Diagnosis not present

## 2022-08-21 DIAGNOSIS — Z923 Personal history of irradiation: Secondary | ICD-10-CM | POA: Diagnosis not present

## 2022-08-21 NOTE — Progress Notes (Signed)
Radiation Oncology Follow up Note  Name: [name] [name]   Date:   08/21/2022 MRN:  [id number] DOB: 15-Sep-1955    This 67 y.o. 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  REFERRING PROVIDER: Hillery Aldo, MD  HPI: Patient is a 67 year old female now out 1 month having completed whole breast radiation to her right breast for ER positive ductal carcinoma in situ.  Seen today in routine follow-up she is doing well all skin reaction has subsided.  She specifically denies breast tenderness cough or bone pain..  She has been started on Femara which is causing some hot flashes.  COMPLICATIONS OF TREATMENT: none  FOLLOW UP COMPLIANCE: keeps appointments   PHYSICAL EXAM:  BP (!) 102/53   Pulse 84   Temp 98.3 F (36.8 C)   Resp 16   Ht 5\' 4"  (1.626 m)   BMI 43.60 kg/m  Wheelchair-bound obese female in NAD.  Lungs are clear to A&P cardiac examination essentially unremarkable with regular rate and rhythm. No dominant mass or nodularity is noted in either breast in 2 positions examined. Incision is well-healed. No axillary or supraclavicular adenopathy is appreciated. Cosmetic result is excellent.  Well-developed well-nourished patient in NAD. HEENT reveals PERLA, EOMI, discs not visualized.  Oral cavity is clear. No oral mucosal lesions are identified. Neck is clear without evidence of cervical or supraclavicular adenopathy. Lungs are clear to A&P. Cardiac examination is essentially unremarkable with regular rate and rhythm without murmur rub or thrill. Abdomen is benign with no organomegaly or masses noted. Motor sensory and DTR levels are equal and symmetric in the upper and lower extremities. Cranial nerves II through XII are grossly intact. Proprioception is intact. No peripheral adenopathy or edema is identified. No motor or sensory levels are noted. Crude visual fields are within normal  range.  RADIOLOGY RESULTS: No current films for review  PLAN: Present time patient is doing well 1 month out from whole breast radiation based on her mobility issues I am going to turn follow-up care over to medical oncology.  She continues on Femara without side effect.  I have suggested some vitamin D supplements for her hot flashes.  Patient is to call with any concerns.  I would like to take this opportunity to thank you for allowing me to participate in the care of your patient.Carmina Miller, MD

## 2022-08-21 NOTE — Telephone Encounter (Signed)
Advised patient

## 2022-09-13 ENCOUNTER — Ambulatory Visit: Payer: 59 | Admitting: Dermatology

## 2022-10-11 ENCOUNTER — Ambulatory Visit
Admission: RE | Admit: 2022-10-11 | Discharge: 2022-10-11 | Disposition: A | Discharge status: Home / Self Care | Payer: 59 | Source: Ambulatory Visit | Attending: Oncology | Admitting: Oncology

## 2022-10-11 DIAGNOSIS — Z780 Asymptomatic menopausal state: Secondary | ICD-10-CM | POA: Insufficient documentation

## 2022-10-11 DIAGNOSIS — Z923 Personal history of irradiation: Secondary | ICD-10-CM | POA: Insufficient documentation

## 2022-10-11 DIAGNOSIS — M069 Rheumatoid arthritis, unspecified: Secondary | ICD-10-CM | POA: Insufficient documentation

## 2022-10-11 DIAGNOSIS — D0511 Intraductal carcinoma in situ of right breast: Secondary | ICD-10-CM | POA: Insufficient documentation

## 2022-10-11 DIAGNOSIS — M8588 Other specified disorders of bone density and structure, other site: Secondary | ICD-10-CM | POA: Diagnosis not present

## 2022-10-11 DIAGNOSIS — F17200 Nicotine dependence, unspecified, uncomplicated: Secondary | ICD-10-CM | POA: Diagnosis not present

## 2022-10-11 DIAGNOSIS — Z9221 Personal history of antineoplastic chemotherapy: Secondary | ICD-10-CM | POA: Diagnosis not present

## 2022-10-16 ENCOUNTER — Telehealth: Payer: Self-pay | Admitting: Cardiology

## 2022-10-16 NOTE — Telephone Encounter (Signed)
Pt c/o of Chest Pain: STAT if CP now or developed within 24 hours  1. Are you having CP right now? Yes  2. Are you experiencing any other symptoms (ex. SOB, nausea, vomiting, sweating)? No  3. How long have you been experiencing CP? For 3 days now   4. Is your CP continuous or coming and going? Continuous   5. Have you taken Nitroglycerin? No  Patient stated that their hand started to hurt and the pain goes from their hand up their arms to the middle of their back.  ?

## 2022-10-16 NOTE — Telephone Encounter (Signed)
The patient called in with complaints of pain in her hands, arms, and radiates to her back. She has been having this pain for  three days. She also stated that she has not been able to urinate as much and her urine is now dark yellow. She is post Covid.   She denies chest pain and shortness of breath outside of her norm.   She stated that her PCP is currently out on vacation so she wanted to get Dr. Merita Norton recommendations. She has been advised to go to a urgent care or try to get in with her PCP's office.

## 2022-10-17 NOTE — Telephone Encounter (Signed)
Patient has been made aware and has an appointment today to be seen

## 2022-10-17 NOTE — Telephone Encounter (Addendum)
Attempted to reach the patient. Was unable to leave a message.   Debbe Odea, MD  You; Alvin Critchley, Nadara Mustard, CMA29 minutes ago (10:15 AM)    I agree with urgent care/ED recommendation.  If PCP is out on vacation, the usually is someone in the office covering.  Patient should reach out to them.  Thank you

## 2022-10-18 ENCOUNTER — Other Ambulatory Visit: Payer: Self-pay | Admitting: Primary Care

## 2022-10-18 DIAGNOSIS — J449 Chronic obstructive pulmonary disease, unspecified: Secondary | ICD-10-CM

## 2022-10-19 ENCOUNTER — Ambulatory Visit: Payer: 59 | Admitting: Oncology

## 2022-10-23 ENCOUNTER — Ambulatory Visit: Payer: 59 | Admitting: Dermatology

## 2022-11-01 ENCOUNTER — Encounter: Payer: Self-pay | Admitting: Internal Medicine

## 2022-11-01 ENCOUNTER — Ambulatory Visit (INDEPENDENT_AMBULATORY_CARE_PROVIDER_SITE_OTHER): Payer: 59 | Admitting: Internal Medicine

## 2022-11-01 VITALS — BP 120/74 | HR 86 | Temp 97.9°F | Ht 64.0 in | Wt 254.0 lb

## 2022-11-01 DIAGNOSIS — J449 Chronic obstructive pulmonary disease, unspecified: Secondary | ICD-10-CM | POA: Diagnosis not present

## 2022-11-01 DIAGNOSIS — G4733 Obstructive sleep apnea (adult) (pediatric): Secondary | ICD-10-CM

## 2022-11-01 MED ORDER — TRELEGY ELLIPTA 200-62.5-25 MCG/ACT IN AEPB
1.0000 | INHALATION_SPRAY | Freq: Every day | RESPIRATORY_TRACT | 10 refills | Status: DC
Start: 2022-11-01 — End: 2023-05-16

## 2022-11-01 MED ORDER — TRELEGY ELLIPTA 200-62.5-25 MCG/ACT IN AEPB: 1.0000 | INHALATION_SPRAY | Freq: Every day | RESPIRATORY_TRACT | Status: DC

## 2022-11-01 NOTE — Progress Notes (Signed)
SYNOPSIS [name] [name] is a 67 y.o. female former smoker ( 35 pack year history , quit 1995 ) with history of  COPD , allergies, and sleep apnea per in lab sleep study done 4-5 years ago. She had a CPAP machine, but she could not tolerate it. Consult to evaluate if pt. Is still + for OSA.    CC  Follow-up assessment of COPD Follow-up assessment of OSA  HPI Regarding COPD Seems to be stable with inhalers However using albuterol more frequently due to secondhand smoke exposure Lets plan to use Trelegy 200 and see if that helps her symptoms   No exacerbation at this time No evidence of heart failure at this time No evidence or signs of infection at this time No respiratory distress No fevers, chills, nausea, vomiting, diarrhea No evidence of lower extremity edema No evidence hemoptysis  Severe OSA Excellent compliance 100% for days and greater than 4 hours AHI reduced to 1.6 previous AHI 60 Compliance report reviewed in detail with patient   Past Medical History:  Diagnosis Date   (HFpEF) heart failure with preserved ejection fraction (HCC)    a.) TTE 05/21/2016: EF 50%, mild LV dil, ant HK, mild RAE, G1DD; b.) TTE 06/06/2019: EF 60-65%, G2DD; c.) TTE 04/04/2021: EF 60-65%, mod LAE, mild RAE, mod MAC, mild MV stenosis, G2DD   Allergic rhinitis    Allergy    Aortic atherosclerosis (HCC)    Asthma    Chronic back pain    COPD (chronic obstructive pulmonary disease) (HCC)    Degenerative joint disease of knee, left    Depression    Diverticulosis    Dyspnea    Edema    GERD (gastroesophageal reflux disease)    Headache    Hidradenitis    History of bilateral cataract extraction    History of colonic polyps    HLD (hyperlipidemia)    Hypertension    Hypothyroidism    IBS (irritable bowel syndrome)    IDA (iron deficiency anemia)    Insomnia    a.) takes trazodone PRN   Intraductal papilloma of breast, right    Mitral valve stenosis 04/04/2021   a.) TTE  04/04/2021: mild MS (MPG 5.0 mm Hg).   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 There is wall motion abnormality    suggestive of coronary artery disease and mild diastolic    dysfunction.   Sleep Study AHI 60       Latest Ref Rng & Units 06/29/2022   11:51 AM 10/07/2019    2:16 PM 09/29/2019    5:52 PM  CBC  WBC 4.0 - 10.5 K/uL 6.3  21.4  14.4   Hemoglobin 12.0 - 15.0 g/dL 78.2  95.6  21.3   Hematocrit 36.0 - 46.0 % 43.6  41.9  39.5   Platelets  150 - 400 K/uL 213  275  262        Latest Ref Rng & Units 05/12/2021    3:18 PM 01/17/2021   11:33 AM 09/29/2019    5:52 PM  BMP  Glucose 70 - 99 mg/dL 604  540  981   BUN 8 - 23 mg/dL 33  18  12   Creatinine 0.44 - 1.00 mg/dL 1.91  4.78  2.95   Sodium 135 - 145 mmol/L 132  137  137   Potassium 3.5 - 5.1 mmol/L 4.4  4.3  4.3   Chloride 98 - 111 mmol/L 98  101  101   CO2 22 - 32 mmol/L 25  27  25    Calcium 8.9 - 10.3 mg/dL 9.2  9.3  9.2     BNP    Component Value Date/Time   BNP 36.0 05/20/2016 2155    ProBNP No results found for: "PROBNP"  PFT No results found for: "FEV1PRE", "FEV1POST", "FVCPRE", "FVCPOST", "TLC", "DLCOUNC", "PREFEV1FVCRT", "PSTFEV1FVCRT"  DG Bone Density  Result Date: 10/11/2022 EXAM: DUAL X-RAY ABSORPTIOMETRY (DXA) FOR BONE MINERAL DENSITY IMPRESSION: [name] [name] completed a FRAX assessment on 10/11/2022 using the Lunar  iDXA DXA System (analysis version: 14.10) manufactured by [employer]. The following summarizes the results of our evaluation. PATIENT BIOGRAPHICAL: Name: [name] [name] Patient ID: [id number] Birth Date: 1956-03-04 Height:    64.0 in. Gender:     Female    [age]       Weight:    254.0 lbs. Ethnicity:  White                            Exam Date: 10/11/2022 FRAX* RESULTS:  (version: 3.5) 10-year Probability of Fracture1 Major Osteoporotic Fracture2 Hip Fracture 8.6% 1.0% Population: [place] (Caucasian) Risk Factors: Rheumatoid Arthritis, Tobacco User (Current Smoker) Based on Femur (Right) Neck BMD 1 -The 10-year probability of fracture may be lower than reported if the patient has received treatment. 2 -Major Osteoporotic Fracture: Clinical Spine, Forearm, Hip or Shoulder *FRAX is a Armed forces logistics/support/administrative officer of the [employer] of [employer] for Metabolic Bone Disease, a World Science writer (WHO) [employer]. ASSESSMENT: The probability of a major osteoporotic fracture is 8.6% within the next ten years. The probability of a hip fracture is 1.0% within the next ten years. . Your patient [name] [name] completed a BMD test on 10/11/2022 using the [employer] iDXA DXA System (software version: 14.10) manufactured by [employer]. The following summarizes the results of our evaluation. Technologist: MTB PATIENT BIOGRAPHICAL: Name: [name], [name] Patient ID: [id number] Birth Date: [birth year] Height: 64.0 in. Gender: Female Exam Date: 10/11/2022 Weight: 254.0 lbs. Indications: Breast CA, Caucasian, History of Chemo, History of Radiation, Hysterectomy, Oophorectomy Bilateral, Postmenopausal, Rheumatoid Arthritis, Tobacco User (Current Smoker) Fractures: Treatments: Levothyroxine, Vitamin D DENSITOMETRY RESULTS: Site         Region     Measured Date Measured Age WHO Classification Young Adult T-score BMD         %Change vs. Previous Significant Change (*) AP Spine L3-L4 10/11/2023 66.2  Osteopenia -1.9 0.977 g/cm2 - - DualFemur Neck Right 10/11/2022 66.2 Normal -0.8 0.927 g/cm2 - - DualFemur Total Mean 10/11/2022 66.2 Normal -0.3 0.971 g/cm2 - - Left Forearm Radius 33% 10/11/2022 66.2 Normal 0.6 0.927 g/cm2 - - ASSESSMENT: The BMD measured at AP Spine L3-L4 is  0.977 g/cm2 with a T-score of -1.9. This patient is considered osteopenic according to World Health Organization [facility]) criteria. The scan quality is good. L-1 and L-2 were excluded due to degenerative changes. World Science writer [facility]) criteria for post-menopausal, Caucasian Women: Normal:                   T-score at or above -1 SD Osteopenia/low bone mass: T-score between -1 and -2.5 SD Osteoporosis:             T-score at or below -2.5 SD RECOMMENDATIONS: 1. All patients should optimize calcium and vitamin D intake. 2. Consider FDA-approved medical therapies in postmenopausal women and men aged 33 years and older, based on the following: a. A hip or vertebral(clinical or morphometric) fracture b. T-score < -2.5 at the femoral neck or spine after appropriate evaluation to exclude secondary causes c. Low bone mass (T-score between -1.0 and -2.5 at the femoral neck or spine) and a 10-year probability of a hip fracture > 3% or a 10-year probability of a major osteoporosis-related fracture > 20% based on the [place]-adapted WHO algorithm 3. Clinician judgment and/or patient preferences may indicate treatment for people with 10-year fracture probabilities above or below these levels FOLLOW-UP: People with diagnosed cases of osteoporosis or at high risk for fracture should have regular bone mineral density tests. For patients eligible for Medicare, routine testing is allowed once every 2 years. The testing frequency can be increased to one year for patients who have rapidly progressing disease, those who are receiving or discontinuing medical therapy to restore bone mass, or have additional risk factors. I have reviewed this report, and agree  with the above findings. [facility] Electronically Signed   By: Baird Lyons M.D.   On: 10/11/2022 15:03     Past medical hx Past Medical History:  Diagnosis Date   (HFpEF) heart failure with preserved ejection fraction (HCC)    a.) TTE 05/21/2016: EF 50%, mild LV dil, ant HK, mild RAE, G1DD; b.) TTE 06/06/2019: EF 60-65%, G2DD; c.) TTE 04/04/2021: EF 60-65%, mod LAE, mild RAE, mod MAC, mild MV stenosis, G2DD   Allergic rhinitis    Allergy    Aortic atherosclerosis (HCC)    Asthma    Chronic back pain    COPD (chronic obstructive pulmonary disease) (HCC)    Degenerative joint disease of knee, left    Depression    Diverticulosis    Dyspnea    Edema    GERD (gastroesophageal reflux disease)    Headache    Hidradenitis    History of bilateral cataract extraction    History of colonic polyps    HLD (hyperlipidemia)    Hypertension    Hypothyroidism    IBS (irritable bowel syndrome)    IDA (iron deficiency anemia)    Insomnia    a.) takes trazodone PRN   Intraductal papilloma of breast, right    Mitral valve stenosis 04/04/2021   a.) TTE 04/04/2021: mild MS (MPG 5.0 mm Hg).   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 Her smoking use included cigarettes. She started smoking about 48 years ago. She has a 60 pack-year smoking history. She has never used smokeless tobacco. She reports that she does not drink alcohol and does not use drugs.  Tobacco Cessation: Former smoker with a 35 pack year smoking history  Past surgical hx, Family hx, Social hx all reviewed.  Current Outpatient Medications on File Prior to Visit  Medication Sig   acetaminophen (TYLENOL) 500 MG tablet Take 500 mg by mouth every 6 (six) hours as needed.   albuterol (PROVENTIL HFA;VENTOLIN HFA) 108 (90 Base) MCG/ACT inhaler Inhale 2 puffs into the lungs every 6 (six) hours as needed for wheezing or shortness of breath.   aspirin EC 81 MG tablet Take 81 mg by mouth daily.   atorvastatin (LIPITOR) 20 MG tablet Take 20 mg by mouth at bedtime.    BIOTIN MAXIMUM PO Take by mouth daily.   budesonide-formoterol (SYMBICORT) 160-4.5 MCG/ACT inhaler Inhale 2 puffs into the lungs 2 (two) times daily.   clobetasol (OLUX) 0.05 % topical foam APPLY TO AFFECTED AREAS TOPICALLY TWICE DAILY AS DIRECTED   Colchicine 0.6 MG CAPS Take by mouth.   cyclobenzaprine (FLEXERIL) 10 MG tablet Take 10 mg by mouth at bedtime as needed.   doxepin (SINEQUAN) 50 MG capsule Take 50 mg by mouth at bedtime.   DULoxetine (CYMBALTA) 20 MG capsule Take 40 mg by mouth daily.   empagliflozin (JARDIANCE) 10 MG TABS tablet Take 25 mg by mouth daily.   fluticasone (FLONASE) 50 MCG/ACT nasal spray as needed.   gabapentin (NEURONTIN) 300 MG capsule Take 600 mg by mouth 2 (two) times daily.   glucose blood test strip TEST three times a day   ibuprofen (ADVIL) 800 MG tablet Take 1 tablet (800 mg total) by mouth every 8 (eight) hours as needed for moderate pain.   ibuprofen (ADVIL,MOTRIN) 800 MG tablet Take 1 tablet (800 mg total) by mouth every 8 (eight) hours as needed for mild pain or moderate pain (with food).    ipratropium (ATROVENT HFA) 17 MCG/ACT inhaler Inhale 2 puffs into the lungs every 6 (six) hours as needed for wheezing. Patient uses once a day   ipratropium-albuterol (DUONEB) 0.5-2.5 (3) MG/3ML SOLN Take 3 mLs by nebulization every 6 (six) hours as needed.   ketoconazole (NIZORAL) 2 % shampoo Shampoo into scalp let sit 10 minutes then wash out. Use 3d/wk.   Lancets Misc. (ACCU-CHEK FASTCLIX LANCET) KIT    letrozole (FEMARA) 2.5 MG tablet Take 1 tablet (2.5 mg total) by mouth daily.   levocetirizine (XYZAL) 5 MG tablet TAKE 1 TABLET BY MOUTH EVERY EVENING   levothyroxine (SYNTHROID, LEVOTHROID) 200 MCG tablet Take 175 mcg by mouth daily before breakfast.   Melatonin 5 MG CAPS Take 2 capsules by mouth daily.   meloxicam (MOBIC) 7.5 MG tablet Take 7.5 mg by mouth  daily.   olopatadine (PATANOL) 0.1 % ophthalmic solution Place 1 drop into both eyes 2 (two) times daily.   omeprazole (PRILOSEC) 40 MG capsule Take 40 mg by mouth daily.   OXYGEN Inhale into the lungs. With CPAP   potassium citrate (UROCIT-K) 10 MEQ (1080 MG) SR tablet TAKE ONE TABLET BY MOUTH EVERY MORNING   psyllium (METAMUCIL) 58.6 % powder Take 1 packet by mouth as needed.   roflumilast (DALIRESP) 500 MCG TABS tablet Take 500 mcg by mouth daily.   silver sulfADIAZINE (SILVADENE) 1 % cream Apply 1 Application topically 2 (two) times daily.   tirzepatide [facility]) 5 MG/0.5ML Pen Inject 5 mg into the skin once a week. Saturday   torsemide (DEMADEX) 20 MG tablet TAKE ONE TABLET BY MOUTH DAILY   traZODone (DESYREL) 50 MG tablet Take 50-100 mg by mouth at bedtime. Takes 150 mg   No current facility-administered medications on file prior to visit.     Allergies  Allergen Reactions   Codeine Itching   Levofloxacin Rash    "welts" & itching    BP 120/74 (BP Location: Left Arm, Cuff Size: Large)   Pulse 86   Temp 97.9 F (36.6 C)   Ht 5\' 4"  (1.626 m)   Wt 254 lb (115.2 kg)   SpO2 97%   BMI 43.60 kg/m       Review of  Systems: Gen:  Denies  fever, sweats, chills weight loss  HEENT: Denies blurred vision, double vision, ear pain, eye pain, hearing loss, nose bleeds, sore throat Cardiac:  No dizziness, chest pain or heaviness, chest tightness,edema, No JVD Resp:   No cough, -sputum production, -shortness of breath,-wheezing, -hemoptysis,  Other:  All other systems negative   Physical Examination:   General Appearance: No distress  EYES PERRLA, EOM intact.   NECK Supple, No JVD Pulmonary: normal breath sounds, No wheezing.  CardiovascularNormal S1,S2.  No m/r/g.   Abdomen: Benign, Soft, non-tender. Neurology UE/LE 5/5 strength, no focal deficits Ext pulses intact, cap refill intact ALL OTHER ROS ARE NEGATIVE   Assessment/Plan   67 year old obese white female with severe OSA with AHI of 60 with chronic hypoxic respiratory failure in the setting of COPD  Follow-up assessment of OSA Excellent compliance report   COPD Plan to stop Symbicort and start Trelegy 200 Rinse mouth out after use Avoid secondhand smoke Avoid SICK contacts Recommend  Masking  when appropriate Recommend Keep up-to-date with vaccinations   Chronic Hypoxic resp failure due to COPD Continue oxygen as prescribed Patient uses for survival   COPD In the Symbicort as prescribed Rinse mouth after use Albuterol as needed  Obesity -recommend significant weight loss -recommend changing diet  Deconditioned state -Recommend increased daily activity and exercise   MEDICATION ADJUSTMENTS/LABS AND TESTS ORDERED: Continue CPAP as prescribed Continue inhalers as prescribed Continue oxygen as prescribed   Patient satisfied with Plan of action and management. All questions answered  Follow-up in 1 year  Time spent 24 minutes   Jadie Allington Santiago Glad, M.D.  [facility]  Medical Director [facility] Medical Director [facility] Cardio-Pulmonary Department

## 2022-11-01 NOTE — Patient Instructions (Addendum)
Continue CPAP as prescribed Plan to stop Symbicort and START TRELEGY 2oo inhaler Continue oxygen as prescribed  Avoid secondhand smoke Avoid SICK contacts Recommend  Masking  when appropriate Recommend Keep up-to-date with vaccinations

## 2022-11-06 ENCOUNTER — Encounter: Payer: Self-pay | Admitting: Oncology

## 2022-11-06 ENCOUNTER — Inpatient Hospital Stay: Payer: 59 | Attending: Oncology | Admitting: Oncology

## 2022-11-06 VITALS — BP 91/80 | HR 82 | Temp 99.1°F | Resp 18 | Ht 64.0 in | Wt 242.0 lb

## 2022-11-06 DIAGNOSIS — Z79811 Long term (current) use of aromatase inhibitors: Secondary | ICD-10-CM | POA: Insufficient documentation

## 2022-11-06 DIAGNOSIS — D0511 Intraductal carcinoma in situ of right breast: Secondary | ICD-10-CM | POA: Insufficient documentation

## 2022-11-06 DIAGNOSIS — M8580 Other specified disorders of bone density and structure, unspecified site: Secondary | ICD-10-CM | POA: Insufficient documentation

## 2022-11-06 DIAGNOSIS — M3390 Dermatopolymyositis, unspecified, organ involvement unspecified: Secondary | ICD-10-CM | POA: Insufficient documentation

## 2022-11-06 DIAGNOSIS — Z923 Personal history of irradiation: Secondary | ICD-10-CM | POA: Diagnosis not present

## 2022-11-06 NOTE — Progress Notes (Unsigned)
Mentioned getting a shooting pain in right breast.

## 2022-11-06 NOTE — Progress Notes (Unsigned)
[facility]  [phone] [phone]  ID: [name] [name] OB: 16-Nov-1955  MR#: [id number]  QIO#:[id number]  Patient Care Team: Hillery Aldo, MD as PCP - General (Family Medicine) Debbe Odea, MD as PCP - Cardiology (Cardiology) Delma Freeze, FNP as Nurse Practitioner (Family Medicine) Mertie Moores, MD as Consulting Physician (Pulmonary Disease) Lady Gary Darlin Priestly, MD as Consulting Physician (Cardiology) Hulen Luster, RN as Oncology Nurse Navigator  CHIEF COMPLAINT: DCIS, right breast.  INTERVAL HISTORY: Patient returns to clinic today for further evaluation at the end of her XRT and to initiate letrozole.  She tolerated treatments well with only some mild discomfort. She has no neurologic complaints.  She denies any recent fevers or illnesses.  She has a good appetite and denies weight loss.  She has no chest pain, shortness of breath, cough, or hemoptysis.  She denies any nausea, vomiting, constipation, or diarrhea.  She has no urinary complaints.  Patient offers no specific complaints today.  REVIEW OF SYSTEMS:   Review of Systems  Constitutional: Negative.  Negative for fever, malaise/fatigue and weight loss.  Respiratory: Negative.  Negative for cough, hemoptysis and shortness of breath.   Cardiovascular: Negative.  Negative for chest pain and leg swelling.  Gastrointestinal: Negative.  Negative for abdominal pain.  Genitourinary: Negative.  Negative for dysuria.  Musculoskeletal: Negative.  Negative for back pain.  Skin: Negative.  Negative for rash.  Neurological: Negative.  Negative for dizziness, focal weakness, weakness and headaches.  Psychiatric/Behavioral: Negative.  The patient is not nervous/anxious.     As per HPI. Otherwise, a complete review of systems is negative.  PAST MEDICAL HISTORY: Past Medical History:  Diagnosis Date   (HFpEF) heart failure with preserved ejection fraction (HCC)    a.) TTE 05/21/2016: EF  50%, mild LV dil, ant HK, mild RAE, G1DD; b.) TTE 06/06/2019: EF 60-65%, G2DD; c.) TTE 04/04/2021: EF 60-65%, mod LAE, mild RAE, mod MAC, mild MV stenosis, G2DD   Allergic rhinitis    Allergy    Aortic atherosclerosis (HCC)    Asthma    Chronic back pain    COPD (chronic obstructive pulmonary disease) (HCC)    Degenerative joint disease of knee, left    Depression    Diverticulosis    Dyspnea    Edema    GERD (gastroesophageal reflux disease)    Headache    Hidradenitis    History of bilateral cataract extraction    History of colonic polyps    HLD (hyperlipidemia)    Hypertension    Hypothyroidism    IBS (irritable bowel syndrome)    IDA (iron deficiency anemia)    Insomnia    a.) takes trazodone PRN   Intraductal papilloma of breast, right    Mitral valve stenosis 04/04/2021   a.) TTE 04/04/2021: mild MS (MPG 5.0 mm Hg).   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    Sinusitis, chronic    T2DM (type 2 diabetes mellitus) (HCC)    Vaginitis, atrophic    Vertigo    Vitamin D deficiency     PAST SURGICAL HISTORY: Past Surgical History:  Procedure Laterality Date   ABDOMINAL HYSTERECTOMY     AXILLARY HIDRADENITIS EXCISION  BREAST BIOPSY Right 02/22/2022   stereo bx, calcs, "X" clip- ATYPICAL INTRADUCTAL PAPILLARY PROLIFERATION WITH SCLEROSIS AND CALCIFICATION PROLIFERATION WITH SCLEROSIS AND CALCIFICATION   BREAST BIOPSY Right 02/22/2022   MM RT BREAST BX W LOC DEV 1ST LESION IMAGE BX SPEC STEREO GUIDE 02/22/2022 [facility]   BREAST BIOPSY Right 04/04/2022   rt br calcs coil clip, PASH   BREAST BIOPSY Right 04/04/2022   MM RT BREAST BX W LOC DEV 1ST LESION IMAGE BX SPEC STEREO GUIDE 04/04/2022 [facility]   BREAST BIOPSY Right  04/26/2022   MM RT RADIO FREQUENCY TAG EA ADD LESION LOC MAMMO GUIDE 04/26/2022 [facility]   BREAST BIOPSY Right 04/26/2022   MM RT RADIO FREQUENCY TAG LOC MAMMO GUIDE 04/26/2022 [facility]   BREAST EXCISIONAL BIOPSY Bilateral 12/2015   RUPTURED FOLLICULAR CYSTS WITH ABSCESSES AND SCARRING, CONSISTENT WITH HIDRADENITIS SUPPURATIVA.    BREAST LUMPECTOMY WITH RADIOFREQUENCY TAG IDENTIFICATION Right 05/09/2022   Procedure: BREAST LUMPECTOMY WITH RADIOFREQUENCY TAG IDENTIFICATION;  Surgeon: Henrene Dodge, MD;  Location: [facility];  Service: General;  Laterality: Right;   CATARACT EXTRACTION W/PHACO Right 07/27/2020   Procedure: CATARACT EXTRACTION PHACO AND INTRAOCULAR LENS PLACEMENT (IOC) RIGHT DIABETIC 17.13 01:23.9;  Surgeon: Galen Manila, MD;  Location: [facility];  Service: Ophthalmology;  Laterality: Right   CATARACT EXTRACTION W/PHACO Left 04/11/2022   Procedure: CATARACT EXTRACTION PHACO AND INTRAOCULAR LENS PLACEMENT (IOC) LEFT DIABETIC 15.05 01:18.6;  Surgeon: Galen Manila, MD;  Location: [facility];  Service: Ophthalmology;  Laterality: Left   CESAREAN SECTION     x 2   CHOLECYSTECTOMY     COLONOSCOPY WITH PROPOFOL N/A 09/15/2021   Procedure: COLONOSCOPY WITH PROPOFOL;  Surgeon: Toney Reil, MD;  Location: [facility];  Service: Gastroenterology;  Laterality: N/A;   HEEL SPUR EXCISION N/A    HYDRADENITIS EXCISION Right 12/31/2015   Procedure: EXCISION HIDRADENITIS AXILLA;  Surgeon: Ricarda Frame, MD;  Location: [facility];  Service: General;  Laterality: Right;   KNEE SURGERY Left    1998   TONSILLECTOMY      FAMILY HISTORY: Family History  Problem Relation Age of Onset   Rashes / Skin problems Father    Hypertension Father    Heart disease Father    Breast cancer Paternal Grandmother    COPD Mother    Kidney cancer Neg Hx    Bladder Cancer Neg Hx     ADVANCED DIRECTIVES (Y/N):  N  HEALTH MAINTENANCE: Social History    Tobacco Use   Smoking status: Former    Current packs/day: 0.00    Average packs/day: 3.0 packs/day for 20.0 years (60.0 ttl pk-yrs)    Types: Cigarettes    Start date: 12/18/1973    Quit date: 12/18/1993    Years since quitting: 28.9   Smokeless tobacco: Never  Vaping Use   Vaping status: Never Used  Substance Use Topics   Alcohol use: No   Drug use: No    Types: Marijuana     Colonoscopy:  PAP:  Bone density:  Lipid panel:  Allergies  Allergen Reactions   Codeine Itching   Levofloxacin Rash    "welts" & itching    Current Outpatient Medications  Medication Sig Dispense Refill   acetaminophen (TYLENOL) 500 MG tablet Take 500 mg by mouth every 6 (six) hours as needed.     albuterol (PROVENTIL HFA;VENTOLIN HFA) 108 (90 Base) MCG/ACT inhaler Inhale 2 puffs into the lungs every 6 (six) hours as needed for wheezing or shortness of breath.  aspirin EC 81 MG tablet Take 81 mg by mouth daily.     clobetasol (OLUX) 0.05 % topical foam APPLY TO AFFECTED AREAS TOPICALLY TWICE DAILY AS DIRECTED 50 g 0   Colchicine 0.6 MG CAPS Take by mouth.     cyclobenzaprine (FLEXERIL) 10 MG tablet Take 10 mg by mouth at bedtime as needed.     doxepin (SINEQUAN) 50 MG capsule Take 50 mg by mouth at bedtime.     doxycycline (VIBRA-TABS) 100 MG tablet Take 100 mg by mouth 2 (two) times daily.     DULoxetine (CYMBALTA) 60 MG capsule Take 60 mg by mouth daily.     Epinastine HCl 0.05 % ophthalmic solution Apply 1 drop to eye 2 (two) times daily.     fluticasone (FLONASE) 50 MCG/ACT nasal spray as needed.     Fluticasone-Umeclidin-Vilant (TRELEGY ELLIPTA) 200-62.5-25 MCG/ACT AEPB Inhale 1 Act into the lungs daily. 1 each 10   Fluticasone-Umeclidin-Vilant (TRELEGY ELLIPTA) 200-62.5-25 MCG/ACT AEPB Inhale 1 puff into the lungs daily.     gabapentin (NEURONTIN) 600 MG tablet Take 600 mg by mouth. 1 tablet in the am, 1 tablet in the pm and 2 tablets at night     glucose blood test strip TEST three  times a day     HUMULIN R U-500 KWIKPEN 500 UNIT/ML KwikPen Inject 3 mLs into the skin. 75 units in the am and 65 units in the evening     ipratropium (ATROVENT HFA) 17 MCG/ACT inhaler Inhale 2 puffs into the lungs every 6 (six) hours as needed for wheezing. Patient uses once a day     JARDIANCE 25 MG TABS tablet Take 25 mg by mouth daily.     ketoconazole (NIZORAL) 2 % shampoo Shampoo into scalp let sit 10 minutes then wash out. Use 3d/wk. 120 mL 11   Lancets Misc. (ACCU-CHEK FASTCLIX LANCET) KIT      letrozole (FEMARA) 2.5 MG tablet Take 1 tablet (2.5 mg total) by mouth daily. 90 tablet 3   levothyroxine (SYNTHROID) 150 MCG tablet Take 150 mcg by mouth daily.     LINZESS 145 MCG CAPS capsule Take 145 mcg by mouth daily.     meclizine (ANTIVERT) 25 MG tablet Take 25 mg by mouth 2 (two) times daily as needed.     meloxicam (MOBIC) 15 MG tablet Take 15 mg by mouth daily.     montelukast (SINGULAIR) 10 MG tablet Take 10 mg by mouth daily.     MOUNJARO 10 MG/0.5ML Pen Inject 10 mg into the skin once a week.     nystatin (MYCOSTATIN) 100000 UNIT/ML suspension Take 5 mLs by mouth 4 (four) times daily.     omeprazole (PRILOSEC) 40 MG capsule Take 40 mg by mouth daily.     OXYGEN Inhale into the lungs. With CPAP     potassium citrate (UROCIT-K) 10 MEQ (1080 MG) SR tablet TAKE ONE TABLET BY MOUTH EVERY MORNING 90 tablet 3   roflumilast (DALIRESP) 500 MCG TABS tablet Take 500 mcg by mouth daily.     torsemide (DEMADEX) 20 MG tablet TAKE ONE TABLET BY MOUTH DAILY 90 tablet 3   traZODone (DESYREL) 150 MG tablet Take 150 mg by mouth at bedtime.     triamcinolone cream (KENALOG) 0.1 % Apply 1 Application topically 2 (two) times daily.     atorvastatin (LIPITOR) 20 MG tablet Take 20 mg by mouth at bedtime.  (Patient not taking: Reported on 11/06/2022)     BIOTIN MAXIMUM PO Take  by mouth daily. (Patient not taking: Reported on 11/01/2022)     ibuprofen (ADVIL) 800 MG tablet Take 1 tablet (800 mg total) by  mouth every 8 (eight) hours as needed for moderate pain. (Patient not taking: Reported on 11/01/2022) 60 tablet 1   ibuprofen (ADVIL,MOTRIN) 800 MG tablet Take 1 tablet (800 mg total) by mouth every 8 (eight) hours as needed for mild pain or moderate pain (with food). (Patient not taking: Reported on 11/01/2022) 20 tablet 0   ipratropium-albuterol (DUONEB) 0.5-2.5 (3) MG/3ML SOLN Take 3 mLs by nebulization every 6 (six) hours as needed. (Patient not taking: Reported on 11/01/2022) 360 mL 10   levocetirizine (XYZAL) 5 MG tablet TAKE 1 TABLET BY MOUTH EVERY EVENING (Patient not taking: Reported on 11/01/2022) 30 tablet 5   Melatonin 5 MG CAPS Take 2 capsules by mouth daily. (Patient not taking: Reported on 11/01/2022)     olopatadine (PATANOL) 0.1 % ophthalmic solution Place 1 drop into both eyes 2 (two) times daily. (Patient not taking: Reported on 11/01/2022)     psyllium (METAMUCIL) 58.6 % powder Take 1 packet by mouth as needed. (Patient not taking: Reported on 11/01/2022)     silver sulfADIAZINE (SILVADENE) 1 % cream Apply 1 Application topically 2 (two) times daily. (Patient not taking: Reported on 11/01/2022) 50 g 3   No current facility-administered medications for this visit.    OBJECTIVE: Vitals:   11/06/22 1431 11/06/22 1436  BP: (!) 88/39 91/80  Pulse: 82   Resp: 18   Temp: 99.1 F (37.3 C)   SpO2: 97%      Body mass index is 41.54 kg/m.    ECOG FS:0 - Asymptomatic  General: Well-developed, well-nourished, no acute distress.  Sitting in a wheelchair. Eyes: Pink conjunctiva, anicteric sclera. HEENT: Normocephalic, moist mucous membranes. Lungs: No audible wheezing or coughing. Heart: Regular rate and rhythm. Abdomen: Soft, nontender, no obvious distention. Musculoskeletal: No edema, cyanosis, or clubbing. Neuro: Alert, answering all questions appropriately. Cranial nerves grossly intact. Skin: No rashes or petechiae noted. Psych: Normal affect.  LAB RESULTS:  Lab Results   Component Value Date   NA 132 (L) 05/12/2021   K 4.4 05/12/2021   CL 98 05/12/2021   CO2 25 05/12/2021   GLUCOSE 195 (H) 05/12/2021   BUN 33 (H) 05/12/2021   CREATININE 1.16 (H) 05/12/2021   CALCIUM 9.2 05/12/2021   PROT 7.4 06/25/2017   ALBUMIN 3.4 (L) 06/25/2017   AST 19 06/25/2017   ALT 11 (L) 06/25/2017   ALKPHOS 122 06/25/2017   BILITOT 0.6 06/25/2017   GFRNONAA 53 (L) 05/12/2021   GFRAA >60 09/29/2019    Lab Results  Component Value Date   WBC 6.3 06/29/2022   NEUTROABS 17.3 (H) 10/07/2019   HGB 13.8 06/29/2022   HCT 43.6 06/29/2022   MCV 84.7 06/29/2022   PLT 213 06/29/2022     STUDIES: DG Bone Density  Result Date: 10/11/2022 EXAM: DUAL X-RAY ABSORPTIOMETRY (DXA) FOR BONE MINERAL DENSITY IMPRESSION: Dear Dr Orlie Dakin, Your patient JOULES PRUDENCIO completed a FRAX assessment on 10/11/2022 using the Lunar iDXA DXA System (analysis version: 14.10) manufactured by [employer]. The following summarizes the results of our evaluation. PATIENT BIOGRAPHICAL: Name: Cateria, Fleegle Patient ID: [id number] Birth Date: July 12, 1955 Height:    64.0 in. Gender:     Female    [age]        31.2       Weight:    254.0 lbs. Ethnicity:  White  Exam Date: 10/11/2022 FRAX* RESULTS:  (version: 3.5) 10-year Probability of Fracture1 Major Osteoporotic Fracture2 Hip Fracture 8.6% 1.0% Population: [place] (Caucasian) Risk Factors: Rheumatoid Arthritis, Tobacco User (Current Smoker) Based on Femur (Right) Neck BMD 1 -The 10-year probability of fracture may be lower than reported if the patient has received treatment. 2 -Major Osteoporotic Fracture: Clinical Spine, Forearm, Hip or Shoulder *FRAX is a Armed forces logistics/support/administrative officer of the [employer] of [employer] for Metabolic Bone Disease, a World Science writer (WHO) [employer]. ASSESSMENT: The probability of a major osteoporotic fracture is 8.6% within the next ten years. The probability of a hip fracture is 1.0%  within the next ten years. . Your patient Burdell Frieling completed a BMD test on 10/11/2022 using the [employer] iDXA DXA System (software version: 14.10) manufactured by [employer]. The following summarizes the results of our evaluation. Technologist: MTB PATIENT BIOGRAPHICAL: Name: Ayliah, Flick Patient ID: [id number] Birth Date: Aug 01, 1955 Height: 64.0 in. Gender: Female Exam Date: 10/11/2022 Weight: 254.0 lbs. Indications: Breast CA, Caucasian, History of Chemo, History of Radiation, Hysterectomy, Oophorectomy Bilateral, Postmenopausal, Rheumatoid Arthritis, Tobacco User (Current Smoker) Fractures: Treatments: Levothyroxine, Vitamin D DENSITOMETRY RESULTS: Site         Region     Measured Date Measured Age WHO Classification Young Adult T-score BMD         %Change vs. Previous Significant Change (*) AP Spine L3-L4 10/11/2023 66.2 Osteopenia -1.9 0.977 g/cm2 - - DualFemur Neck Right 10/11/2022 66.2 Normal -0.8 0.927 g/cm2 - - DualFemur Total Mean 10/11/2022 66.2 Normal -0.3 0.971 g/cm2 - - Left Forearm Radius 33% 10/11/2022 66.2 Normal 0.6 0.927 g/cm2 - - ASSESSMENT: The BMD measured at AP Spine L3-L4 is 0.977 g/cm2 with a T-score of -1.9. This patient is considered osteopenic according to World Health Organization [facility]) criteria. The scan quality is good. L-1 and L-2 were excluded due to degenerative changes. World Science writer [facility]) criteria for post-menopausal, Caucasian Women: Normal:                   T-score at or above -1 SD Osteopenia/low bone mass: T-score between -1 and -2.5 SD Osteoporosis:             T-score at or below -2.5 SD RECOMMENDATIONS: 1. All patients should optimize calcium and vitamin D intake. 2. Consider FDA-approved medical therapies in postmenopausal women and men aged 5 years and older, based on the following: a. A hip or vertebral(clinical or morphometric) fracture b. T-score < -2.5 at the femoral neck or spine after appropriate evaluation to exclude secondary causes  c. Low bone mass (T-score between -1.0 and -2.5 at the femoral neck or spine) and a 10-year probability of a hip fracture > 3% or a 10-year probability of a major osteoporosis-related fracture > 20% based on the [place]-adapted WHO algorithm 3. Clinician judgment and/or patient preferences may indicate treatment for people with 10-year fracture probabilities above or below these levels FOLLOW-UP: People with diagnosed cases of osteoporosis or at high risk for fracture should have regular bone mineral density tests. For patients eligible for Medicare, routine testing is allowed once every 2 years. The testing frequency can be increased to one year for patients who have rapidly progressing disease, those who are receiving or discontinuing medical therapy to restore bone mass, or have additional risk factors. I have reviewed this report, and agree with the above findings. [facility] Electronically Signed   By: Baird Lyons M.D.   On: 10/11/2022 15:03  ASSESSMENT: DCIS, right breast.  PLAN:    DCIS, right breast: Pathology reviewed independently.  Patient underwent lumpectomy on May 09, 2022.  Given the noninvasive nature of her pathology, she did not require adjuvant chemotherapy.  She completed adjuvant XRT on July 05, 2022.  Because of interaction with Cymbalta, patient cannot take tamoxifen therefore was given a prescription for letrozole which she will take for a total of 5 years completing treatment in April 2029.  Return to clinic in 3 months for routine evaluation.  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 Bone health: Will get a baseline bone mineral density in the next several weeks.   Patient expressed understanding and was in agreement with this plan. She also understands that She can call clinic at any time with any questions, concerns, or complaints.    Cancer Staging  Ductal carcinoma in situ (DCIS) of right breast Staging form: Breast, AJCC  8th Edition - Clinical stage from 05/25/2022: Stage 0 (cTis (DCIS), cN0, cM0, ER+, PR: Not Assessed, HER2: Not Assessed) - Signed by Jeralyn Ruths, MD on 05/25/2022 Stage prefix: Initial diagnosis Nuclear grade: G2   Jeralyn Ruths, MD   11/06/2022 3:26 PM

## 2022-11-07 LAB — BASIC METABOLIC PANEL: EGFR: 60

## 2022-11-14 ENCOUNTER — Ambulatory Visit: Payer: 59 | Attending: Family | Admitting: Family

## 2022-11-14 ENCOUNTER — Telehealth: Payer: Self-pay | Admitting: Family

## 2022-11-14 ENCOUNTER — Encounter: Payer: Self-pay | Admitting: Family

## 2022-11-14 VITALS — BP 103/42 | HR 85 | Ht 63.0 in | Wt 245.0 lb

## 2022-11-14 DIAGNOSIS — I5032 Chronic diastolic (congestive) heart failure: Secondary | ICD-10-CM | POA: Diagnosis not present

## 2022-11-14 DIAGNOSIS — I428 Other cardiomyopathies: Secondary | ICD-10-CM | POA: Insufficient documentation

## 2022-11-14 DIAGNOSIS — E119 Type 2 diabetes mellitus without complications: Secondary | ICD-10-CM | POA: Insufficient documentation

## 2022-11-14 DIAGNOSIS — Z87891 Personal history of nicotine dependence: Secondary | ICD-10-CM | POA: Diagnosis not present

## 2022-11-14 DIAGNOSIS — Z794 Long term (current) use of insulin: Secondary | ICD-10-CM

## 2022-11-14 DIAGNOSIS — N6091 Unspecified benign mammary dysplasia of right breast: Secondary | ICD-10-CM

## 2022-11-14 DIAGNOSIS — J449 Chronic obstructive pulmonary disease, unspecified: Secondary | ICD-10-CM | POA: Diagnosis not present

## 2022-11-14 DIAGNOSIS — I10 Essential (primary) hypertension: Secondary | ICD-10-CM | POA: Diagnosis not present

## 2022-11-14 DIAGNOSIS — I110 Hypertensive heart disease with heart failure: Secondary | ICD-10-CM | POA: Diagnosis not present

## 2022-11-14 DIAGNOSIS — D0510 Intraductal carcinoma in situ of unspecified breast: Secondary | ICD-10-CM | POA: Diagnosis not present

## 2022-11-14 NOTE — Progress Notes (Addendum)
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   Has not been admitted or been in the ED in the last 6 months.  Echo done on 05/21/16 and showed an EF of 50% along with trivial MR/TR/PR. EF has declined slightly from June 2017. Echo report on 06/06/19 reviewed and showed an EF of 60-65% Echo 04/04/21: EF of 60-65%, Left atrial size was moderately dilated. .   She presents today for a follow up appointment with a chief complaint of minimal SOB with moderate exertion. Chronic in nature. Has associated fatigue, wheezing, dizziness, slight pedal edema and left arm pain along with this. Denies chest pain, palpitations, cough, abdominal distention or difficulty sleeping.   Had covid early July. Currently has her 5 y/o grandson living with her and reports that this has been stressful for her. She says that she is going to start counseling so she can learn how to handle him.   ROS: All systems negative except as listed in HPI, PMH and Problem List.  SH:  Social History   Socioeconomic History   Marital status: Divorced    Spouse name: Not on file   Number of children: Not on file   Years of education: Not on file   Highest education level: Not on file  Occupational History   Occupation: disabled  Tobacco Use   Smoking status: Former    Current packs/day: 0.00    Average packs/day: 3.0 packs/day for 20.0 years (60.0 ttl pk-yrs)    Types: Cigarettes    Start date: 12/18/1973    Quit date: 12/18/1993    Years since quitting: 28.9   Smokeless tobacco: Never  Vaping Use   Vaping status: Never Used  Substance and Sexual Activity    Alcohol use: No   Drug use: No    Types: Marijuana   Sexual activity: Never    Birth control/protection: Abstinence  Other Topics Concern   Not on file  Social History Narrative   Lives alone   Social Determinants of Health   Financial Resource Strain: Low Risk  (03/09/2017)   Overall Financial Resource Strain (CARDIA)    Difficulty of Paying Living Expenses: Not hard at all  Food Insecurity: Unknown (03/09/2017)   Hunger Vital Sign    Worried About Running Out of Food in the Last Year: Patient declined    Ran Out of Food in the Last Year: Patient declined  Transportation Needs: Unknown (03/09/2017)   PRAPARE - Administrator, Civil Service (Medical): Patient declined    Lack of Transportation (Non-Medical): Patient declined  Physical Activity: Unknown (03/09/2017)   Exercise Vital Sign    Days of Exercise per Week: Patient declined    Minutes of Exercise per Session: Patient declined  Stress: No Stress Concern Present (03/09/2017)   [employer] of Occupational Health - Occupational Stress Questionnaire    Feeling of Stress : Not at all  Social Connections: Somewhat Isolated (03/09/2017)   Social Connection and Isolation Panel [NHANES]    Frequency of Communication with Friends and Family: More than three times a week    Frequency of Social Gatherings with Friends and Family: Once a week  Attends Religious Services: More than 4 times per year    Active Member of Clubs or Organizations: No    Attends Banker Meetings: Never    Marital Status: Divorced  Catering manager Violence: Unknown (03/09/2017)   Humiliation, Afraid, Rape, and Kick questionnaire    Fear of Current or Ex-Partner: Patient declined    Emotionally Abused: Patient declined    Physically Abused: Patient declined    Sexually Abused: Patient declined    FH:  Family History  Problem Relation Age of Onset   Rashes / Skin problems Father    Hypertension Father    Heart  disease Father    Breast cancer Paternal Grandmother    COPD Mother    Kidney cancer Neg Hx    Bladder Cancer Neg Hx     Past Medical History:  Diagnosis Date   (HFpEF) heart failure with preserved ejection fraction (HCC)    a.) TTE 05/21/2016: EF 50%, mild LV dil, ant HK, mild RAE, G1DD; b.) TTE 06/06/2019: EF 60-65%, G2DD; c.) TTE 04/04/2021: EF 60-65%, mod LAE, mild RAE, mod MAC, mild MV stenosis, G2DD   Allergic rhinitis    Allergy    Aortic atherosclerosis (HCC)    Asthma    Chronic back pain    COPD (chronic obstructive pulmonary disease) (HCC)    Degenerative joint disease of knee, left    Depression    Diverticulosis    Dyspnea    Edema    GERD (gastroesophageal reflux disease)    Headache    Hidradenitis    History of bilateral cataract extraction    History of colonic polyps    HLD (hyperlipidemia)    Hypertension    Hypothyroidism    IBS (irritable bowel syndrome)    IDA (iron deficiency anemia)    Insomnia    a.) takes trazodone PRN   Intraductal papilloma of breast, right    Mitral valve stenosis 04/04/2021   a.) TTE 04/04/2021: mild [name] (MPG 5.0 mm Hg).   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     albuterol (PROVENTIL HFA;VENTOLIN HFA) 108 (90 Base) MCG/ACT inhaler Inhale 2 puffs into the lungs every 6 (six) hours as needed for wheezing or shortness of breath.     aspirin EC 81 MG tablet Take 81 mg by mouth daily.     atorvastatin (LIPITOR) 20 MG  tablet Take 20 mg by mouth at bedtime.  (Patient not taking: Reported on 11/06/2022)     BIOTIN MAXIMUM PO Take by mouth daily. (Patient not taking: Reported on 11/01/2022)     clobetasol (OLUX) 0.05 % topical foam APPLY TO AFFECTED AREAS TOPICALLY TWICE DAILY AS DIRECTED 50 g 0   Colchicine 0.6 MG CAPS Take by mouth.     cyclobenzaprine (FLEXERIL) 10 MG tablet Take 10 mg by mouth at bedtime as needed.  doxepin (SINEQUAN) 50 MG capsule Take 50 mg by mouth at bedtime.     doxycycline (VIBRA-TABS) 100 MG tablet Take 100 mg by mouth 2 (two) times daily.     DULoxetine (CYMBALTA) 60 MG capsule Take 60 mg by mouth daily.     Epinastine HCl 0.05 % ophthalmic solution Apply 1 drop to eye 2 (two) times daily.     fluticasone (FLONASE) 50 MCG/ACT nasal spray as needed.     Fluticasone-Umeclidin-Vilant (TRELEGY ELLIPTA) 200-62.5-25 MCG/ACT AEPB Inhale 1 Act into the lungs daily. 1 each 10   Fluticasone-Umeclidin-Vilant (TRELEGY ELLIPTA) 200-62.5-25 MCG/ACT AEPB Inhale 1 puff into the lungs daily.     gabapentin (NEURONTIN) 600 MG tablet Take 600 mg by mouth. 1 tablet in the am, 1 tablet in the pm and 2 tablets at night     glucose blood test strip TEST three times a day     HUMULIN R U-500 KWIKPEN 500 UNIT/ML KwikPen Inject 3 mLs into the skin. 75 units in the am and 65 units in the evening     ibuprofen (ADVIL) 800 MG tablet Take 1 tablet (800 mg total) by mouth every 8 (eight) hours as needed for moderate pain. (Patient not taking: Reported on 11/01/2022) 60 tablet 1   ibuprofen (ADVIL,MOTRIN) 800 MG tablet Take 1 tablet (800 mg total) by mouth every 8 (eight) hours as needed for mild pain or moderate pain (with food). (Patient not taking: Reported on 11/01/2022) 20 tablet 0   ipratropium (ATROVENT HFA) 17 MCG/ACT inhaler Inhale 2 puffs into the lungs every 6 (six) hours as needed for wheezing. Patient uses once a day     ipratropium-albuterol (DUONEB) 0.5-2.5 (3) MG/3ML SOLN Take 3 mLs by nebulization  every 6 (six) hours as needed. (Patient not taking: Reported on 11/01/2022) 360 mL 10   JARDIANCE 25 MG TABS tablet Take 25 mg by mouth daily.     ketoconazole (NIZORAL) 2 % shampoo Shampoo into scalp let sit 10 minutes then wash out. Use 3d/wk. 120 mL 11   Lancets Misc. (ACCU-CHEK FASTCLIX LANCET) KIT      letrozole (FEMARA) 2.5 MG tablet Take 1 tablet (2.5 mg total) by mouth daily. 90 tablet 3   levocetirizine (XYZAL) 5 MG tablet TAKE 1 TABLET BY MOUTH EVERY EVENING (Patient not taking: Reported on 11/01/2022) 30 tablet 5   levothyroxine (SYNTHROID) 150 MCG tablet Take 150 mcg by mouth daily.     LINZESS 145 MCG CAPS capsule Take 145 mcg by mouth daily.     meclizine (ANTIVERT) 25 MG tablet Take 25 mg by mouth 2 (two) times daily as needed.     Melatonin 5 MG CAPS Take 2 capsules by mouth daily. (Patient not taking: Reported on 11/01/2022)     meloxicam (MOBIC) 15 MG tablet Take 15 mg by mouth daily.     montelukast (SINGULAIR) 10 MG tablet Take 10 mg by mouth daily.     MOUNJARO 10 MG/0.5ML Pen Inject 10 mg into the skin once a week.     nystatin (MYCOSTATIN) 100000 UNIT/ML suspension Take 5 mLs by mouth 4 (four) times daily.     olopatadine (PATANOL) 0.1 % ophthalmic solution Place 1 drop into both eyes 2 (two) times daily. (Patient not taking: Reported on 11/01/2022)     omeprazole (PRILOSEC) 40 MG capsule Take 40 mg by mouth daily.     OXYGEN Inhale into the lungs. With CPAP     potassium citrate (UROCIT-K) 10 MEQ (1080 MG) SR tablet  TAKE ONE TABLET BY MOUTH EVERY MORNING 90 tablet 3   psyllium (METAMUCIL) 58.6 % powder Take 1 packet by mouth as needed. (Patient not taking: Reported on 11/01/2022)     roflumilast (DALIRESP) 500 MCG TABS tablet Take 500 mcg by mouth daily.     silver sulfADIAZINE (SILVADENE) 1 % cream Apply 1 Application topically 2 (two) times daily. (Patient not taking: Reported on 11/01/2022) 50 g 3   torsemide (DEMADEX) 20 MG tablet TAKE ONE TABLET BY MOUTH DAILY 90 tablet 3    traZODone (DESYREL) 150 MG tablet Take 150 mg by mouth at bedtime.     triamcinolone cream (KENALOG) 0.1 % Apply 1 Application topically 2 (two) times daily.     No current facility-administered medications for this visit.   Vitals:   11/14/22 1523  BP: (!) 103/42  Pulse: 85  SpO2: 98%  Weight: 245 lb (111.1 kg)  Height: 5\' 3"  (1.6 m)   Wt Readings from Last 3 Encounters:  11/14/22 245 lb (111.1 kg)  11/06/22 242 lb (109.8 kg)  11/01/22 254 lb (115.2 kg)   Lab Results  Component Value Date   CREATININE 1.16 (H) 05/12/2021   CREATININE 1.08 (H) 01/17/2021   CREATININE 0.73 09/29/2019   PHYSICAL EXAM:  General:  Well appearing. No resp difficulty HEENT: normal Neck: supple. JVP flat. No lymphadenopathy or thryomegaly appreciated. Cor: PMI normal. Regular rate & rhythm. No rubs, gallops or murmurs. Lungs: clear Abdomen: soft, nontender, nondistended. No hepatosplenomegaly. No bruits or masses.  Extremities: no cyanosis, clubbing, rash, trace pitting edema bilateral lower legs Neuro: alert & orientedx3, cranial nerves grossly intact. Moves all 4 extremities w/o difficulty. Affect pleasant.   ECG: not done   ASSESSMENT & PLAN:  1:NICM with preserved ejection fraction- - suspect due to HTN/ COPD - NYHA class II - euvolemic today - weighing daily; reminded to call for an overnight weight gain of 2 lbs or a weekly weight gain of 5 lbs - weight down 5 pounds from last visit here 9 months ago - Echo done on 05/21/16 and showed an EF of 50% along with trivial MR/TR/PR. EF has declined slightly from June 2017. - Echo report on 06/06/19 reviewed and showed an EF of 60-65% - Echo 04/04/21: EF of 60-65%, Left atrial size was moderately dilated. . - not adding salt and is using a salt substitute. Using meals on wheels for food; reminded her to follow a 2000 mg sodium diet - saw cardiology (Agbor-Etang) 02/24 - continue jardiance 25mg  daily - continue torsemide 20mg  daily/  potassium daily - entresto had been stopped due to hypotension - BP will not tolerate spironolactone - wearing CPAP nightly - BNP 05/20/16 was 36.0  2: HTN- - BP 103/42 - [facility] with PCP at [facility]; last seen yesterday - BMP 11/07/22 (from PCP office) reviewed and showed sodium 141, potassium 4.3, creatinine 1.03 and GFR 60  3: Diabetes- - A1C on 05/21/16 was 7.0% - saw endocrinologist (Solum) 06/19 - continue jardiance 25mg  daily   4: COPD- - confirms rinsing/ spitting after using her inhalers - wears oxygen at 2L at bedtime - saw pulmonologist (Kasa) 08/24 - wearing CPAP nightly  5: Breast cancer (DCIS)- - lumpectomy 05/09/22 - saw surgeon (Piscoya) 03/24 - saw oncologist Orlie Dakin) 08/24 - completed radiation  Return in 6 months, sooner if needed.

## 2022-11-14 NOTE — Telephone Encounter (Signed)
Patient showed up 2 hours late for her appointment but we were able to fit her in the schedule.

## 2022-11-14 NOTE — Telephone Encounter (Deleted)
Patient did not show for her [facility] appointment on 11/14/22.

## 2022-11-22 ENCOUNTER — Other Ambulatory Visit: Payer: Self-pay | Admitting: Family

## 2022-11-22 DIAGNOSIS — I5032 Chronic diastolic (congestive) heart failure: Secondary | ICD-10-CM

## 2022-11-27 ENCOUNTER — Ambulatory Visit: Payer: 59 | Admitting: Surgery

## 2022-12-04 ENCOUNTER — Ambulatory Visit (INDEPENDENT_AMBULATORY_CARE_PROVIDER_SITE_OTHER): Payer: 59 | Admitting: Surgery

## 2022-12-04 ENCOUNTER — Ambulatory Visit: Payer: 59 | Admitting: Dermatology

## 2022-12-04 ENCOUNTER — Encounter: Payer: Self-pay | Admitting: Surgery

## 2022-12-04 VITALS — BP 127/76 | HR 80 | Temp 98.0°F | Ht 63.0 in | Wt 238.0 lb

## 2022-12-04 DIAGNOSIS — D0511 Intraductal carcinoma in situ of right breast: Secondary | ICD-10-CM

## 2022-12-04 NOTE — Patient Instructions (Addendum)
You are due for your bilateral mammogram next month. You may call to schedule this. [phone].  We will have you follow up here in February for an exam. We will send you a letter about this appointment.     May rub Vitamin-E oil or other emmolient agent in area 2-3 times a day to soften the area.

## 2022-12-04 NOTE — Progress Notes (Signed)
12/04/2022  History of Present Illness: [name] [name] is a 67 y.o. female status post right breast tag localized lumpectomy on 05/09/2022 for what initially was thought to be an intraductal papilloma and PASH.  Postoperatively, final pathology upgraded her diagnosis to DCIS.  She has since completed radiation with the oncology team and currently is on letrozole followed by Dr. Orlie Dakin.  She reports that she has been doing well from the breast standpoint and denies any worsening pain.  Does report some intermittent quick onset/offset pain in the right breast but denies any constant or worsening pain.  She reports that she did fall at home last week and had some bruising on her left side.  Past Medical History: Past Medical History:  Diagnosis Date   (HFpEF) heart failure with preserved ejection fraction (HCC)    a.) TTE 05/21/2016: EF 50%, mild LV dil, ant HK, mild RAE, G1DD; b.) TTE 06/06/2019: EF 60-65%, G2DD; c.) TTE 04/04/2021: EF 60-65%, mod LAE, mild RAE, mod MAC, mild MV stenosis, G2DD   Allergic rhinitis    Allergy    Aortic atherosclerosis (HCC)    Asthma    Chronic back pain    COPD (chronic obstructive pulmonary disease) (HCC)    Degenerative joint disease of knee, left    Depression    Diverticulosis    Dyspnea    Edema    GERD (gastroesophageal reflux disease)    Headache    Hidradenitis    History of bilateral cataract extraction    History of colonic polyps    HLD (hyperlipidemia)    Hypertension    Hypothyroidism    IBS (irritable bowel syndrome)    IDA (iron deficiency anemia)    Insomnia    a.) takes trazodone PRN   Intraductal papilloma of breast, right    Mitral valve stenosis 04/04/2021   a.) TTE 04/04/2021: mild MS (MPG 5.0 mm Hg).   Obesity    OSA on CPAP    a.) requires supplemental oxygen to be bled in   Peripheral neuropathy    Pneumonia    PONV (postoperative nausea and vomiting)    PVD (peripheral vascular disease) (HCC)    Right bundle branch  block (RBBB) with left anterior fascicular block    Sinusitis, chronic    T2DM (type 2 diabetes mellitus) (HCC)    Vaginitis, atrophic    Vertigo    Vitamin D deficiency      Past Surgical History: Past Surgical History:  Procedure Laterality Date   ABDOMINAL HYSTERECTOMY     AXILLARY HIDRADENITIS EXCISION     BREAST BIOPSY Right 02/22/2022   stereo bx, calcs, "X" clip- ATYPICAL INTRADUCTAL PAPILLARY PROLIFERATION WITH SCLEROSIS AND CALCIFICATION PROLIFERATION WITH SCLEROSIS AND CALCIFICATION   BREAST BIOPSY Right 02/22/2022   MM RT BREAST BX W LOC DEV 1ST LESION IMAGE BX SPEC STEREO GUIDE 02/22/2022 [facility]   BREAST BIOPSY Right 04/04/2022   rt br calcs coil clip, PASH   BREAST BIOPSY Right 04/04/2022   MM RT BREAST BX W LOC DEV 1ST LESION IMAGE BX SPEC STEREO GUIDE 04/04/2022 [facility]   BREAST BIOPSY Right 04/26/2022   MM RT RADIO FREQUENCY TAG EA ADD LESION LOC MAMMO GUIDE 04/26/2022 [facility]   BREAST BIOPSY Right 04/26/2022   MM RT RADIO FREQUENCY TAG LOC MAMMO GUIDE 04/26/2022 [facility]   BREAST EXCISIONAL BIOPSY Bilateral 12/2015   RUPTURED FOLLICULAR CYSTS WITH ABSCESSES AND SCARRING, CONSISTENT WITH HIDRADENITIS SUPPURATIVA.    BREAST LUMPECTOMY WITH RADIOFREQUENCY TAG IDENTIFICATION Right 05/09/2022  Procedure: BREAST LUMPECTOMY WITH RADIOFREQUENCY TAG IDENTIFICATION;  Surgeon: Henrene Dodge, MD;  Location: [facility];  Service: General;  Laterality: Right;   CATARACT EXTRACTION W/PHACO Right 07/27/2020   Procedure: CATARACT EXTRACTION PHACO AND INTRAOCULAR LENS PLACEMENT (IOC) RIGHT DIABETIC 17.13 01:23.9;  Surgeon: Galen Manila, MD;  Location: [facility];  Service: Ophthalmology;  Laterality: Right   CATARACT EXTRACTION W/PHACO Left 04/11/2022   Procedure: CATARACT EXTRACTION PHACO AND INTRAOCULAR LENS PLACEMENT (IOC) LEFT DIABETIC 15.05 01:18.6;  Surgeon: Galen Manila, MD;  Location: [facility];  Service: Ophthalmology;   Laterality: Left   CESAREAN SECTION     x 2   CHOLECYSTECTOMY     COLONOSCOPY WITH PROPOFOL N/A 09/15/2021   Procedure: COLONOSCOPY WITH PROPOFOL;  Surgeon: Toney Reil, MD;  Location: [facility];  Service: Gastroenterology;  Laterality: N/A;   HEEL SPUR EXCISION N/A    HYDRADENITIS EXCISION Right 12/31/2015   Procedure: EXCISION HIDRADENITIS AXILLA;  Surgeon: Ricarda Frame, MD;  Location: [facility];  Service: General;  Laterality: Right;   KNEE SURGERY Left    1998   TONSILLECTOMY      Home Medications: Prior to Admission medications   Medication Sig Start Date End Date Taking? Authorizing Provider  acetaminophen (TYLENOL) 500 MG tablet Take 500 mg by mouth every 6 (six) hours as needed.   Yes [provider]  albuterol (PROVENTIL HFA;VENTOLIN HFA) 108 (90 Base) MCG/ACT inhaler Inhale 2 puffs into the lungs every 6 (six) hours as needed for wheezing or shortness of breath.   Yes [provider]  aspirin EC 81 MG tablet Take 81 mg by mouth daily.   Yes [provider]  atorvastatin (LIPITOR) 20 MG tablet Take 20 mg by mouth at bedtime.   Yes [provider]  BIOTIN MAXIMUM PO Take by mouth daily.   Yes [provider]  Colchicine 0.6 MG CAPS Take by mouth. 08/17/22  Yes [provider]  cyclobenzaprine (FLEXERIL) 10 MG tablet Take 10 mg by mouth at bedtime as needed. 08/15/22  Yes [provider]  doxepin (SINEQUAN) 50 MG capsule Take 50 mg by mouth at bedtime.   Yes [provider]  doxycycline (VIBRA-TABS) 100 MG tablet Take 100 mg by mouth 2 (two) times daily. 11/03/22  Yes [provider]  DULoxetine (CYMBALTA) 60 MG capsule Take 60 mg by mouth daily. 09/06/22  Yes [provider]  Epinastine HCl 0.05 % ophthalmic solution Apply 1 drop to eye 2 (two) times daily. 10/02/22  Yes [provider]  fluticasone (FLONASE) 50 MCG/ACT nasal spray as needed. 06/24/18  Yes [provider]  Fluticasone-Umeclidin-Vilant (TRELEGY ELLIPTA) 200-62.5-25 MCG/ACT AEPB Inhale 1 Act into the lungs daily. 11/01/22  Yes Erin Fulling, MD  Fluticasone-Umeclidin-Vilant (TRELEGY ELLIPTA) 200-62.5-25 MCG/ACT AEPB Inhale 1 puff into the lungs daily. 11/01/22  Yes Erin Fulling, MD  gabapentin (NEURONTIN) 600 MG tablet Take 600 mg by mouth. 1 tablet in the am, 1 tablet in the pm and 2 tablets at night  Pt reports taking 1 tablet at night 08/28/22  Yes [provider]  glucose blood test strip TEST three times a day 12/30/15  Yes [provider]  HUMULIN R U-500 KWIKPEN 500 UNIT/ML KwikPen Inject 3 mLs into the skin. 75 units in the am and 65 units in the evening 08/28/22  Yes [provider]  ibuprofen (ADVIL) 800 MG tablet Take 1 tablet (800 mg total) by mouth every 8 (eight) hours as needed for moderate pain.  05/09/22  Yes Audyn Dimercurio, Elita Quick, MD  ibuprofen (ADVIL,MOTRIN) 800 MG tablet Take 1 tablet (800 mg total) by mouth every 8 (eight) hours as needed for mild pain or moderate pain (with food). 12/21/16  Yes Rockne Menghini, MD  ipratropium (ATROVENT HFA) 17 MCG/ACT inhaler Inhale 2 puffs into the lungs every 6 (six) hours as needed for wheezing. Patient uses once a day   Yes [provider]  JARDIANCE 25 MG TABS tablet Take 25 mg by mouth daily. 10/23/22  Yes [provider]  ketoconazole (NIZORAL) 2 % shampoo Shampoo into scalp let sit 10 minutes then wash out. Use 3d/wk. 01/12/22  Yes Moye, [place], MD  Lancets Misc. (ACCU-CHEK FASTCLIX LANCET) KIT  12/27/15  Yes [provider]  letrozole (FEMARA) 2.5 MG tablet Take 1 tablet (2.5 mg total) by mouth daily. 07/05/22  Yes Jeralyn Ruths, MD  levocetirizine (XYZAL) 5 MG tablet TAKE 1 TABLET BY MOUTH EVERY EVENING 08/15/22  Yes Moye, [place], MD  levothyroxine (SYNTHROID) 150 MCG tablet Take 150 mcg by mouth daily. 09/15/22  Yes [provider]  LINZESS 145 MCG CAPS capsule  Take 145 mcg by mouth daily. 10/09/22  Yes [provider]  meclizine (ANTIVERT) 25 MG tablet Take 25 mg by mouth 2 (two) times daily as needed. 09/13/22  Yes [provider]  Melatonin 5 MG CAPS Take 2 capsules by mouth daily.   Yes [provider]  montelukast (SINGULAIR) 10 MG tablet Take 10 mg by mouth daily. 09/19/22  Yes [provider]  MOUNJARO 10 MG/0.5ML Pen Inject 10 mg into the skin once a week. 10/09/22  Yes [provider]  olopatadine (PATANOL) 0.1 % ophthalmic solution Place 1 drop into both eyes 2 (two) times daily.   Yes [provider]  omeprazole (PRILOSEC) 40 MG capsule Take 40 mg by mouth daily.   Yes [provider]  OXYGEN Inhale into the lungs. With CPAP   Yes [provider]  potassium citrate (UROCIT-K) 10 MEQ (1080 MG) SR tablet TAKE ONE TABLET BY MOUTH EVERY MORNING 11/22/22  Yes Hackney, Tina A, FNP  roflumilast (DALIRESP) 500 MCG TABS tablet Take 500 mcg by mouth daily.   Yes [provider]  silver sulfADIAZINE (SILVADENE) 1 % cream Apply 1 Application topically 2 (two) times daily. 07/04/22  Yes Chrystal, Sherrine Maples, MD  torsemide (DEMADEX) 20 MG tablet TAKE ONE TABLET BY MOUTH DAILY 11/22/22  Yes Clarisa Kindred A, FNP  traZODone (DESYREL) 150 MG tablet Take 150 mg by mouth at bedtime. 09/19/22  Yes [provider]  meloxicam (MOBIC) 15 MG tablet Take 15 mg by mouth daily. Patient not taking: Reported on 11/14/2022 10/18/22   [provider]  nystatin (MYCOSTATIN) 100000 UNIT/ML suspension Take 5 mLs by mouth 4 (four) times daily. Patient not taking: Reported on 11/14/2022 10/09/22   [provider]    Allergies: Allergies  Allergen Reactions   Codeine Itching   Levofloxacin Rash    "welts" & itching    Review of Systems: Review of Systems  Constitutional:  Negative for chills and fever.  Respiratory:  Negative for shortness of breath.   Cardiovascular:  Negative for  chest pain.  Gastrointestinal:  Negative for nausea and vomiting.    Physical Exam BP 127/76   Pulse 80   Temp 98 F (36.7 C)   Ht 5\' 3"  (1.6 m)   Wt 238 lb (108 kg)   SpO2 98%   BMI 42.16 kg/m  CONSTITUTIONAL: No  acute distress HEENT:  Normocephalic, atraumatic, extraocular motion intact. RESPIRATORY:  Normal respiratory effort without pathologic use of accessory muscles. CARDIOVASCULAR: Regular rhythm and rate. BREAST: Right breast status postlumpectomy in the outer lower quadrant.  Incisions well-healed but there is palpable firmness and skin changes consistent with radiation changes and scarring around the area.  Otherwise no other skin changes outside of radiation changes and no nipple changes.  No right axillary lymphadenopathy. NEUROLOGIC:  Motor and sensation is grossly normal.  Cranial nerves are grossly intact. PSYCH:  Alert and oriented to person, place and time. Affect is normal.   Assessment and Plan: This is a 67 y.o. female status post right breast lumpectomy.  - Discussed with patient that the changes in the right breast are consistent with postoperative scarring as well as radiation changes.  This should improve with time as her skin and tissues heal from the radiation and inflammation from the surgery.  Although the firmness may not fully resolve they should improve.  Encouraged her to start using vitamin E lotion or cocoa butter lotion to help nourish and heal the skin better. - Patient will be due for mammogram towards the end of next month.  We will place order for this today. - Patient will follow-up with me in February 2025 for another breast exam.  I spent 20 minutes dedicated to the care of this patient on the date of this encounter to include pre-visit review of records, face-to-face time with the patient discussing diagnosis and management, and any post-visit coordination of care.   Howie Ill, MD [facility]

## 2022-12-13 ENCOUNTER — Ambulatory Visit (INDEPENDENT_AMBULATORY_CARE_PROVIDER_SITE_OTHER): Payer: 59 | Admitting: Dermatology

## 2022-12-13 ENCOUNTER — Encounter: Payer: Self-pay | Admitting: Dermatology

## 2022-12-13 DIAGNOSIS — L700 Acne vulgaris: Secondary | ICD-10-CM

## 2022-12-13 DIAGNOSIS — M3310 Other dermatopolymyositis, organ involvement unspecified: Secondary | ICD-10-CM

## 2022-12-13 DIAGNOSIS — Z7189 Other specified counseling: Secondary | ICD-10-CM

## 2022-12-13 DIAGNOSIS — M3313 Other dermatomyositis without myopathy: Secondary | ICD-10-CM

## 2022-12-13 MED ORDER — TRIAMCINOLONE ACETONIDE 0.1 % EX CREA: TOPICAL_CREAM | CUTANEOUS | 2 refills | Status: AC

## 2022-12-13 MED ORDER — CLOBETASOL PROPIONATE 0.05 % EX SOLN: CUTANEOUS | 3 refills | Status: AC

## 2022-12-13 NOTE — Progress Notes (Signed)
Follow-Up Visit   Subjective  [name] [name] is a 67 y.o. 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 Using Ketoconazole shampoo as directed. Used Clo Itching. New rashes at forearms and thighs.  Breast cancer treatment is going well.   Check blackheads under arms.   Per chart review: 05/17/22 scalp biopsy with interface dermatitis  10/10/22 Dr Gerrie Nordmann Rheumatology Dermatomyositis -- Scalp biopsy showed interface dermatitis -- Could be related to her underlying breast cancer -- She has no weakness on exam today.  -- Hold on prednisone -- Continue Plaquenil this will help the skin -- No muscle weakness on exam, myomarker normal   11/06/22 Dr Gerarda Fraction DCIS, right breast: Pathology reviewed independently.  Patient underwent lumpectomy on May 09, 2022.  Given the noninvasive nature of her pathology, she did not require adjuvant chemotherapy.  She completed adjuvant XRT on July 05, 2022.  Because of interaction with Cymbalta, patient cannot take tamoxifen therefore was given a prescription for letrozole which she will take for a total of 5 years completing treatment in April 2029.  12/04/22 general surgery DCIS clear, follow up Feb 2025  The patient has spots, moles and lesions to be evaluated, some may be new or changing and the patient may have concern these could be cancer.   The following portions of the chart were reviewed this encounter and updated as appropriate: medications, allergies, medical history  Review of Systems:  No other skin or systemic complaints except as noted in HPI or Assessment and Plan.  Objective  Well appearing patient in no apparent distress; mood and affect are within normal limits.  A focused examination was performed of the following areas: Scalp, face, back, axillae, abdomen, legs  Relevant exam findings are noted in the Assessment and Plan.    Assessment & Plan     Dermatomyositis  05/17/22 scalp biopsy with interface dermatitis  Exam: Diffuse erythematous patch at frontal scalp with telangiectasias on dermoscopy. Back clear. Pink thin papules coalescing into plaques at B/L extensor forearms and anterior thighs   Chronic and persistent condition with expected duration over one year. Condition is bothersome/symptomatic for patient. Currently flared.   Reviewed notes from surgery and oncology.  Patient with known underlying ductal carcinoma in situ, currently in treatment which is going well.   Dermatomyositis is an multisystem autoimmune condition that most often causes muscle weakness, tiredness, and rashes. It can also cause difficulty swallowing and affecting the lungs or heart. Up to 40% of adults with dermatomyositis have an underlying cancer.   Advised patient these additional rashes are likely related to dermatomyositis as well.   Treatment:  Start Clobetasol solution twice daily to scalp as needed for itching. Avoid applying to face, groin, and axilla. Use as directed. Long-term use can cause thinning of the skin.  Continue Ketoconazole shampoo as directed.   Start Triamcinolone cream twice daily forearms and thighs until smooth. Avoid applying to face, groin, and axilla. Use as directed. Long-term use can cause thinning of the skin.   Topical steroids (such as triamcinolone, fluocinolone, fluocinonide, mometasone, clobetasol, halobetasol, betamethasone, hydrocortisone) can cause thinning and lightening of the skin if they are used for too long in the same area. Your physician has selected the right strength medicine for your problem and area affected on the body. Please use your medication only as directed by your physician to prevent side effects.     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  Exam: Open comedones at B/L axillae  Treatment: Benign, observe. Common to have open comedones at axillae secondary to history of HS.      Return in about 3 months (around 03/14/2023) for Dermatomyocytis follow up .  I, Lawson Radar, CMA, am acting as scribe for Elie Goody, MD.   Documentation: I have reviewed the above documentation for accuracy and completeness, and I agree with the above.  Elie Goody, MD

## 2022-12-13 NOTE — Patient Instructions (Addendum)
Start Clobetasol solution twice daily to scalp as needed for itching. Avoid applying to face, groin, and axilla. Use as directed. Long-term use can cause thinning of the skin.  Continue Ketoconazole shampoo as directed.   Start Triamcinolone cream twice daily forearms and thighs until smooth. Avoid applying to face, groin, and axilla. Use as directed. Long-term use can cause thinning of the skin.   Topical steroids (such as triamcinolone, fluocinolone, fluocinonide, mometasone, clobetasol, halobetasol, betamethasone, hydrocortisone) can cause thinning and lightening of the skin if they are used for too long in the same area. Your physician has selected the right strength medicine for your problem and area affected on the body. Please use your medication only as directed by your physician to prevent side effects.    Gentle Skin Care Guide  1. Bathe no more than once a day.  2. Avoid bathing in hot water  3. Use a mild soap like Dove, Vanicream, Cetaphil, CeraVe. Can use Lever 2000 or Cetaphil antibacterial soap  4. Use soap only where you need it. On most days, use it under your arms, between your legs, and on your feet. Let the water rinse other areas unless visibly dirty.  5. When you get out of the bath/shower, use a towel to gently blot your skin dry, don't rub it.  6. While your skin is still a little damp, apply a moisturizing cream such as Vanicream, CeraVe, Cetaphil, Eucerin, Sarna lotion or plain Vaseline Jelly. For hands apply Neutrogena [place] Hand Cream or Excipial Hand Cream.  7. Reapply moisturizer any time you start to itch or feel dry.  8. Sometimes using free and clear laundry detergents can be helpful. Fabric softener sheets should be avoided. Downy Free & Gentle liquid, or any liquid fabric softener that is free of dyes and perfumes, it acceptable to use  9. If your doctor has given you prescription creams you may apply moisturizers over them      Due to recent  changes in healthcare laws, you may see results of your pathology and/or laboratory studies on MyChart before the doctors have had a chance to review them. We understand that in some cases there may be results that are confusing or concerning to you. Please understand that not all results are received at the same time and often the doctors may need to interpret multiple results in order to provide you with the best plan of care or course of treatment. Therefore, we ask that you please give [place] 2 business days to thoroughly review all your results before contacting the office for clarification. Should we see a critical lab result, you will be contacted sooner.   If You Need Anything After Your Visit  If you have any questions or concerns for your doctor, please call our main line at [phone] and press option 4 to reach your doctor's medical assistant. If no one answers, please leave a voicemail as directed and we will return your call as soon as possible. Messages left after 4 pm will be answered the following business day.   You may also send [place] a message via MyChart. We typically respond to MyChart messages within 1-2 business days.  For prescription refills, please ask your pharmacy to contact our office. Our fax number is [phone].  If you have an urgent issue when the clinic is closed that cannot wait until the next business day, you can page your doctor at the number below.    Please note that while we do our  best to be available for urgent issues outside of office hours, we are not available 24/7.   If you have an urgent issue and are unable to reach [place], you may choose to seek medical care at your doctor's office, retail clinic, urgent care center, or emergency room.  If you have a medical emergency, please immediately call 911 or go to the emergency department.  Pager Numbers  - Dr. Gwen Pounds: [phone]  - Dr. Roseanne Reno: [phone]  - Dr. Katrinka Blazing: [phone]   In the event  of inclement weather, please call our main line at [phone] for an update on the status of any delays or closures.  Dermatology Medication Tips: Please keep the boxes that topical medications come in in order to help keep track of the instructions about where and how to use these. Pharmacies typically print the medication instructions only on the boxes and not directly on the medication tubes.   If your medication is too expensive, please contact our office at [phone] option 4 or send [place] a message through MyChart.   We are unable to tell what your co-pay for medications will be in advance as this is different depending on your insurance coverage. However, we may be able to find a substitute medication at lower cost or fill out paperwork to get insurance to cover a needed medication.   If a prior authorization is required to get your medication covered by your insurance company, please allow [place] 1-2 business days to complete this process.  Drug prices often vary depending on where the prescription is filled and some pharmacies may offer cheaper prices.  The website www.goodrx.com contains coupons for medications through different pharmacies. The prices here do not account for what the cost may be with help from insurance (it may be cheaper with your insurance), but the website can give you the price if you did not use any insurance.  - You can print the associated coupon and take it with your prescription to the pharmacy.  - You may also stop by our office during regular business hours and pick up a GoodRx coupon card.  - If you need your prescription sent electronically to a different pharmacy, notify our office through [facility] or by phone at [phone] option 4.     Si Usted Necesita Algo Despus de Su Visita  Tambin puede enviarnos un mensaje a travs de Clinical cytogeneticist. Por lo general respondemos a los mensajes de MyChart en el transcurso de 1 a 2 das hbiles.  Para  renovar recetas, por favor pida a su farmacia que se ponga en contacto con nuestra oficina. Annie Sable de fax es [place] [phone].  Si tiene un asunto urgente cuando la clnica est cerrada y que no puede esperar hasta el siguiente da hbil, puede llamar/localizar a su doctor(a) al nmero que aparece a continuacin.   Por favor, tenga en cuenta que aunque hacemos todo lo posible para estar disponibles para asuntos urgentes fuera del horario de [place], no estamos disponibles las 24 horas del da, los 7 [address]  [address] de la [place].   Si tiene un problema urgente y no puede comunicarse con nosotros, puede optar por buscar atencin mdica  en el consultorio de su doctor(a), en una clnica privada, en un centro de atencin urgente o en una sala de emergencias.  Si tiene Engineer, drilling, por favor llame inmediatamente al 911 o vaya a la sala de emergencias.  Nmeros de bper  - Dr. Gwen Pounds: [phone]  - Dra. Roseanne Reno: [phone]  -  Dr. Katrinka Blazing: [phone]   En caso de inclemencias del tiempo, por favor llame a nuestra lnea principal al [phone] para una actualizacin sobre el [place] de cualquier retraso o cierre.  Consejos para la medicacin en dermatologa: Por favor, guarde las cajas en las que vienen los medicamentos de uso tpico para ayudarle a seguir las instrucciones sobre dnde y cmo usarlos. Las farmacias generalmente imprimen las instrucciones del medicamento slo en las cajas y no directamente en los tubos del [place].   Si su medicamento es muy caro, por favor, pngase en contacto con Rolm [place] llamando al [phone] y presione la opcin 4 o envenos un mensaje a travs de Clinical cytogeneticist.   No podemos decirle cul ser su copago por los medicamentos por adelantado ya que esto es diferente dependiendo de la cobertura de su seguro. Sin embargo, es posible que podamos encontrar un medicamento sustituto a Audiological scientist un formulario para que el seguro cubra el  medicamento que se considera necesario.   Si se requiere una autorizacin previa para que su compaa de seguros [place] su medicamento, por favor permtanos de 1 a 2 das hbiles para completar [address]  Los precios de los medicamentos varan con frecuencia dependiendo del Environmental consultant de dnde se surte la receta y alguna farmacias pueden ofrecer precios ms baratos.  El sitio web www.goodrx.com tiene cupones para medicamentos de Health and safety inspector. Los precios aqu no tienen en cuenta lo que podra costar con la ayuda del seguro (puede ser ms barato con su seguro), pero el sitio web puede darle el precio si no utiliz Tourist information centre manager.  - Puede imprimir el cupn correspondiente y llevarlo con su receta a la farmacia.  - Tambin puede pasar por nuestra oficina durante el horario de atencin regular y Education officer, museum una tarjeta de cupones de GoodRx.  - Si necesita que su receta se enve electrnicamente a una farmacia diferente, informe a nuestra oficina a travs de MyChart de [place] o por telfono llamando al [phone] y presione la opcin 4.

## 2022-12-19 ENCOUNTER — Other Ambulatory Visit: Payer: 59

## 2022-12-19 LAB — LAB REPORT - SCANNED
A1c: 8.2
EGFR: 58

## 2023-01-05 ENCOUNTER — Ambulatory Visit
Admission: RE | Admit: 2023-01-05 | Discharge: 2023-01-05 | Disposition: A | Discharge status: Home / Self Care | Payer: 59 | Source: Ambulatory Visit | Attending: Surgery | Admitting: Surgery

## 2023-01-05 DIAGNOSIS — D0511 Intraductal carcinoma in situ of right breast: Secondary | ICD-10-CM | POA: Insufficient documentation

## 2023-02-08 ENCOUNTER — Other Ambulatory Visit: Payer: Self-pay | Admitting: Dermatology

## 2023-02-08 DIAGNOSIS — L299 Pruritus, unspecified: Secondary | ICD-10-CM

## 2023-02-17 ENCOUNTER — Other Ambulatory Visit: Payer: Self-pay | Admitting: Dermatology

## 2023-02-17 DIAGNOSIS — L219 Seborrheic dermatitis, unspecified: Secondary | ICD-10-CM

## 2023-03-19 ENCOUNTER — Other Ambulatory Visit: Payer: Self-pay | Admitting: Oncology

## 2023-03-26 ENCOUNTER — Ambulatory Visit: Payer: 59 | Admitting: Dermatology

## 2023-04-02 ENCOUNTER — Ambulatory Visit: Payer: Medicaid Other | Admitting: Surgery

## 2023-04-10 ENCOUNTER — Ambulatory Visit (INDEPENDENT_AMBULATORY_CARE_PROVIDER_SITE_OTHER): Payer: 59 | Admitting: Dermatology

## 2023-04-10 DIAGNOSIS — M3310 Other dermatopolymyositis, organ involvement unspecified: Secondary | ICD-10-CM | POA: Diagnosis not present

## 2023-04-10 DIAGNOSIS — L720 Epidermal cyst: Secondary | ICD-10-CM

## 2023-04-10 DIAGNOSIS — M3313 Other dermatomyositis without myopathy: Secondary | ICD-10-CM

## 2023-04-10 DIAGNOSIS — L821 Other seborrheic keratosis: Secondary | ICD-10-CM

## 2023-04-10 MED ORDER — FLUOCINOLONE ACETONIDE BODY 0.01 % EX OIL
TOPICAL_OIL | CUTANEOUS | 5 refills | Status: AC
Start: 2023-04-10 — End: ?

## 2023-04-10 NOTE — Progress Notes (Signed)
   Follow-Up Visit   Subjective  [name] [name] is a 68 y.o. female who presents for the following: follow up for Dermatomyocytis on face and scalp . Pt currently using ketoconazole shampoo and clobetasol solution twice daily. Scalp itching, flaky, bumps on face. Pt currently on doxycycline and prednisone.   The patient has spots, moles and lesions to be evaluated, some may be new or changing and the patient may have concern these could be cancer.   The following portions of the chart were reviewed this encounter and updated as appropriate: medications, allergies, medical history  Review of Systems:  No other skin or systemic complaints except as noted in HPI or Assessment and Plan.  Objective  Well appearing patient in no apparent distress; mood and affect are within normal limits.   A focused examination was performed of the following areas: Scalp, face, bilateral legs   Relevant exam findings are noted in the Assessment and Plan.    Assessment & Plan   SEBORRHEIC KERATOSIS - Stuck-on, waxy, tan-brown papules and/or plaques  - Benign-appearing - Discussed benign etiology and prognosis. - Observe - Call for any changes  Milia L face - tiny firm white papules - type of cyst - benign - may be extracted if symptomatic - observe    Dermatomyositis   Chronic and persistent condition with duration or expected duration over one year. Condition is symptomatic / bothersome to patient. Currently bothersome.  Exam: poikiloderma of scalp  Treatment Plan: Start fluocinolone 0.01% oil apply to scalp BID prn until irritation resolves.  Sent message to Dr Allena Katz regarding systemic therapy, Pt following up with R[facility]ologist Dr. Allena [facility]  Topical steroids (such as triamcinolone, fluocinolone, fluocinonide, mometasone, clobetasol, halobetasol, betamethasone, hydrocortisone) can cause thinning and lightening of the skin if they are used for too long in the same area.  Your physician has selected the right strength medicine for your problem and area affected on the body. Please use your medication only as directed by your physician to prevent side effects.    DERMATOMYOSITIS (HCC)   Related Medications Fluocinolone Acetonide Body 0.01 % OIL Apply to scalp BID as needed until irritation resolves MILIA   SEBORRHEIC KERATOSES    Return in about 6 months (around 10/08/2023) for w/ Dr. Katrinka Blazing.  Wynonia Lawman, CMA, am acting as scribe for Elie Goody, MD .   Documentation: I have reviewed the above documentation for accuracy and completeness, and I agree with the above.  Elie Goody, MD

## 2023-04-10 NOTE — Patient Instructions (Signed)

## 2023-04-17 ENCOUNTER — Encounter: Payer: Self-pay | Admitting: Dermatology

## 2023-04-26 ENCOUNTER — Emergency Department: Payer: 59

## 2023-04-26 ENCOUNTER — Encounter: Payer: Self-pay | Admitting: Intensive Care

## 2023-04-26 ENCOUNTER — Emergency Department
Admission: EM | Admit: 2023-04-26 | Discharge: 2023-04-26 | Disposition: A | Discharge status: Home / Self Care | Payer: 59 | Attending: Emergency Medicine | Admitting: Emergency Medicine

## 2023-04-26 ENCOUNTER — Other Ambulatory Visit: Payer: Self-pay

## 2023-04-26 DIAGNOSIS — R42 Dizziness and giddiness: Secondary | ICD-10-CM | POA: Diagnosis not present

## 2023-04-26 DIAGNOSIS — J323 Chronic sphenoidal sinusitis: Secondary | ICD-10-CM | POA: Diagnosis not present

## 2023-04-26 DIAGNOSIS — I6523 Occlusion and stenosis of bilateral carotid arteries: Secondary | ICD-10-CM | POA: Insufficient documentation

## 2023-04-26 DIAGNOSIS — M546 Pain in thoracic spine: Secondary | ICD-10-CM | POA: Insufficient documentation

## 2023-04-26 DIAGNOSIS — M545 Low back pain, unspecified: Secondary | ICD-10-CM | POA: Diagnosis not present

## 2023-04-26 DIAGNOSIS — R531 Weakness: Secondary | ICD-10-CM | POA: Insufficient documentation

## 2023-04-26 DIAGNOSIS — E785 Hyperlipidemia, unspecified: Secondary | ICD-10-CM | POA: Insufficient documentation

## 2023-04-26 DIAGNOSIS — E119 Type 2 diabetes mellitus without complications: Secondary | ICD-10-CM | POA: Insufficient documentation

## 2023-04-26 DIAGNOSIS — I10 Essential (primary) hypertension: Secondary | ICD-10-CM | POA: Diagnosis not present

## 2023-04-26 DIAGNOSIS — K589 Irritable bowel syndrome without diarrhea: Secondary | ICD-10-CM | POA: Insufficient documentation

## 2023-04-26 DIAGNOSIS — Z20822 Contact with and (suspected) exposure to covid-19: Secondary | ICD-10-CM | POA: Insufficient documentation

## 2023-04-26 DIAGNOSIS — M549 Dorsalgia, unspecified: Secondary | ICD-10-CM | POA: Diagnosis present

## 2023-04-26 DIAGNOSIS — J449 Chronic obstructive pulmonary disease, unspecified: Secondary | ICD-10-CM | POA: Diagnosis not present

## 2023-04-26 LAB — CBC
HCT: 43.5 % (ref 36.0–46.0)
Hemoglobin: 14.1 g/dL (ref 12.0–15.0)
MCH: 27.1 pg (ref 26.0–34.0)
MCHC: 32.4 g/dL (ref 30.0–36.0)
MCV: 83.5 fL (ref 80.0–100.0)
Platelets: 204 10*3/uL (ref 150–400)
RBC: 5.21 MIL/uL — ABNORMAL HIGH (ref 3.87–5.11)
RDW: 14.2 % (ref 11.5–15.5)
WBC: 8.6 10*3/uL (ref 4.0–10.5)
nRBC: 0 % (ref 0.0–0.2)

## 2023-04-26 LAB — URINALYSIS, ROUTINE W REFLEX MICROSCOPIC
Bacteria, UA: NONE SEEN
Bilirubin Urine: NEGATIVE
Glucose, UA: >=500 mg/dL — AB
Hgb urine dipstick: NEGATIVE
Ketones, ur: NEGATIVE mg/dL
Leukocytes,Ua: NEGATIVE
Nitrite: NEGATIVE
Protein, ur: NEGATIVE mg/dL
Specific Gravity, Urine: 1.011 (ref 1.005–1.030)
Squamous Epithelial / HPF: 0 /[HPF] (ref 0–5)
pH: 6 (ref 5.0–8.0)

## 2023-04-26 LAB — BASIC METABOLIC PANEL
Anion gap: 10 (ref 5–15)
BUN: 18 mg/dL (ref 8–23)
CO2: 28 mmol/L (ref 22–32)
Calcium: 9.4 mg/dL (ref 8.9–10.3)
Chloride: 97 mmol/L — ABNORMAL LOW (ref 98–111)
Creatinine, Ser: 1.27 mg/dL — ABNORMAL HIGH (ref 0.44–1.00)
GFR, Estimated: 47 mL/min — ABNORMAL LOW (ref >60)
Glucose, Bld: 168 mg/dL — ABNORMAL HIGH (ref 70–99)
Potassium: 4 mmol/L (ref 3.5–5.1)
Sodium: 135 mmol/L (ref 135–145)

## 2023-04-26 LAB — RESP PANEL BY RT-PCR (RSV, FLU A&B, COVID)  RVPGX2
Influenza A by PCR: NEGATIVE
Influenza B by PCR: NEGATIVE
Resp Syncytial Virus by PCR: NEGATIVE
SARS Coronavirus 2 by RT PCR: NEGATIVE

## 2023-04-26 LAB — CBG MONITORING, ED: Glucose-Capillary: 171 mg/dL — ABNORMAL HIGH (ref 70–99)

## 2023-04-26 MED ORDER — LIDOCAINE 5 % EX PTCH
1.0000 | MEDICATED_PATCH | CUTANEOUS | Status: DC
  Administered 2023-04-26: 1 via TRANSDERMAL
  Filled 2023-04-26: qty 1

## 2023-04-26 MED ORDER — ACETAMINOPHEN 500 MG PO TABS
1000.0000 mg | ORAL_TABLET | Freq: Once | ORAL | Status: AC
  Administered 2023-04-26: 1000 mg via ORAL
  Filled 2023-04-26: qty 2

## 2023-04-26 MED ORDER — METHOCARBAMOL 500 MG PO TABS: 500.0000 mg | ORAL_TABLET | Freq: Three times a day (TID) | ORAL | 0 refills | Status: AC | PRN

## 2023-04-26 MED ORDER — LIDOCAINE 5 % EX PTCH: 1.0000 | MEDICATED_PATCH | Freq: Two times a day (BID) | CUTANEOUS | 0 refills | Status: AC

## 2023-04-26 MED ORDER — METHOCARBAMOL 500 MG PO TABS
500.0000 mg | ORAL_TABLET | Freq: Once | ORAL | Status: AC
  Administered 2023-04-26: 500 mg via ORAL
  Filled 2023-04-26: qty 1

## 2023-04-26 MED ORDER — MECLIZINE HCL 25 MG PO TABS: 25.0000 mg | ORAL_TABLET | Freq: Three times a day (TID) | ORAL | 0 refills | Status: AC | PRN

## 2023-04-26 MED ORDER — MECLIZINE HCL 25 MG PO TABS
25.0000 mg | ORAL_TABLET | Freq: Once | ORAL | Status: AC
  Administered 2023-04-26: 25 mg via ORAL
  Filled 2023-04-26: qty 1

## 2023-04-26 NOTE — ED Provider Notes (Signed)
 [facility] Provider Note    Event Date/Time   First MD Initiated Contact with Patient 04/26/23 1831     (approximate)   History   Back Pain and Weakness   HPI  [name] [name] is a 68 y.o. female who presents to the ED for evaluation of Back Pain and Weakness   Review of PCP visit from November.  Morbidly obese patient seen for MSK pain at that point.  Otherwise history of HTN, HLD, DM, COPD, IBS  Patient presents to the ED for evaluation of acute on chronic back pain and acute on chronic vertigo.  She reports 7 or 8 years of feeling vertigo all the time, it is miserable.  Reports persistent vertigo and feeling miserable.  Also reports chronic atraumatic low back pain.  Reports upper lumbar/lower thoracic back pain improved with IcyHot cream at home, but the pain persist somewhat.   Physical Exam   Triage Vital Signs: ED Triage Vitals  Encounter Vitals Group     BP 04/26/23 1700 109/62     Systolic BP Percentile --      Diastolic BP Percentile --      Pulse Rate 04/26/23 1700 87     Resp 04/26/23 1700 18     Temp 04/26/23 1700 98.2 F (36.8 C)     Temp Source 04/26/23 1700 Oral     SpO2 04/26/23 1700 99 %     Weight 04/26/23 1700 227 lb (103 kg)     Height 04/26/23 1700 5' 3 (1.6 m)     Head Circumference --      Peak Flow --      Pain Score 04/26/23 1706 4     Pain Loc --      Pain Education --      Exclude from Growth Chart --     Most recent vital signs: Vitals:   04/26/23 2200 04/26/23 2230  BP: (!) 111/48 (!) 107/54  Pulse: 78 72  Resp: 18   Temp:    SpO2: 94% 94%    General: Awake, no distress.  Morbidly obese, conversational and looks well.  Able to pull herself forward in stretcher so I can examine her back.  Mild paraspinal right-sided lumbar tenderness without overlying skin changes or signs of trauma. CV:  Good peripheral perfusion.  Resp:  Normal effort.  Abd:  No distention.  MSK:  No deformity noted.   Neuro:  No focal deficits appreciated. Cranial nerves II through XII intact 5/5 strength and sensation in all 4 extremities Other:     ED Results / Procedures / Treatments   Labs (all labs ordered are listed, but only abnormal results are displayed) Labs Reviewed  BASIC METABOLIC PANEL - Abnormal; Notable for the following components:      Result Value   Chloride 97 (*)    Glucose, Bld 168 (*)    Creatinine, Ser 1.27 (*)    GFR, Estimated 47 (*)    All other components within normal limits  CBC - Abnormal; Notable for the following components:   RBC 5.21 (*)    All other components within normal limits  URINALYSIS, ROUTINE W REFLEX MICROSCOPIC - Abnormal; Notable for the following components:   Color, Urine STRAW (*)    APPearance CLEAR (*)    Glucose, UA >=500 (*)    All other components within normal limits  CBG MONITORING, ED - Abnormal; Notable for the following components:   Glucose-Capillary 171 (*)  All other components within normal limits  RESP PANEL BY RT-PCR (RSV, FLU A&B, COVID)  [id number]  CBG MONITORING, ED    EKG Sinus rhythm with a rate of 85 bpm.  Normal axis.  Bundle.  No STEMI.  RADIOLOGY CT head interpreted by me without evidence of acute intracranial pathology  Official radiology report(s): CT HEAD WO CONTRAST ( ) Result Date: 04/26/2023 CLINICAL DATA:  persitent vertigo, feeling like falling to the left EXAM: CT HEAD WITHOUT CONTRAST TECHNIQUE: Contiguous axial images were obtained from the base of the skull through the vertex without intravenous contrast. RADIATION DOSE REDUCTION: This exam was performed according to the departmental dose-optimization program which includes automated exposure control, adjustment of the mA and/or kV according to patient size and/or use of iterative reconstruction technique. COMPARISON:  None Available. FINDINGS: Brain: No evidence of large-territorial acute infarction. No parenchymal hemorrhage. No mass lesion. No  extra-axial collection. No mass effect or midline shift. No hydrocephalus. Basilar cisterns are patent. Vascular: No hyperdense vessel. Atherosclerotic calcifications are present within the cavernous internal carotid arteries. Skull: No acute fracture or focal lesion. Sinuses/Orbits: Left sphenoid sinus complete opacification with hypertrophy of the walls. Left maxillary sinus mucosal thickening. Otherwise paranasal sinuses and mastoid air cells are clear. Bilateral lens replacement. Otherwise the orbits are unremarkable. Other: None. IMPRESSION: 1. No acute intracranial abnormality. 2. Chronic left sphenoid sinusitis. Electronically Signed   By: Morgane  Naveau M.D.   On: 04/26/2023 20:01    PROCEDURES and INTERVENTIONS:  .1-3 Lead EKG Interpretation  Performed by: Claudene Rover, MD Authorized by: Claudene Rover, MD     Interpretation: normal     ECG rate:  70   ECG rate assessment: normal     Rhythm: sinus rhythm     Ectopy: none     Conduction: normal     Medications  lidocaine  (LIDODERM ) 5 % 1 patch (1 patch Transdermal Patch Applied 04/26/23 1933)  meclizine  (ANTIVERT ) tablet 25 mg (25 mg Oral Given 04/26/23 1932)  acetaminophen  (TYLENOL ) tablet 1,000 mg (1,000 mg Oral Given 04/26/23 1932)  methocarbamol  (ROBAXIN ) tablet 500 mg (500 mg Oral Given 04/26/23 1932)     IMPRESSION / MDM / ASSESSMENT AND PLAN / ED COURSE  I reviewed the triage vital signs and the nursing notes.  Differential diagnosis includes, but is not limited to, stroke, BPPV, cauda equina, MSK pain, UTI, viral syndrome  {Patient presents with symptoms of an acute illness or injury that is potentially life-threatening.  Patient presents with acute on chronic vertigo and back pain.  From a vertigo perspective, she has chronic symptoms and no other neurologic deficits and I doubt central cause.  Feeling better after some meclizine .  Clear CT head.  Regarding her back pain she has no red flag features and feels better after  nonnarcotic multimodal analgesia and ambulating her baseline.  Negative urine and viral swabs.  Normal CBC.  CKD near baseline and hyperglycemia without acidosis.  Clinical Course as of 04/26/23 2331  Thu Apr 26, 2023  2243 Reassessed.  Feeling better.  We discussed ambulatory trial prior to discharge and she is agreeable.  Discussed workup overall. [DS]  2329 Patient ambulatory at her baseline with a walker.  She is sitting up and reports feeling much better and is appreciative.  We discussed care at home and ED return precautions.  Discussed prescriptions for home. [DS]    Clinical Course User Index [DS] Claudene Rover, MD     FINAL CLINICAL IMPRESSION(S) / ED DIAGNOSES  Final diagnoses:  Vertigo  Acute bilateral low back pain without sciatica     Rx / DC Orders   ED Discharge Orders          Ordered    lidocaine  (LIDODERM ) 5 %  Every 12 hours        04/26/23 2329    meclizine  (ANTIVERT ) 25 MG tablet  3 times daily PRN        04/26/23 2329    methocarbamol  (ROBAXIN ) 500 MG tablet  Every 8 hours PRN        04/26/23 2329             Note:  This document was prepared using Dragon voice recognition software and may include unintentional dictation errors.   Claudene Rover, MD 04/26/23 [phone]

## 2023-04-26 NOTE — ED Notes (Signed)
 Dr Felipe Horton told this RN to ambulate pt with walker--pt did already ambulate to and from toilet without issue. Pt was given walker, she is ambulating well with it with no complaints

## 2023-04-26 NOTE — ED Triage Notes (Signed)
 Patient c/o dizziness, lower back pain, weakness, and balance issues. Also c/o vertigo issues for years.   LKW yesterday  A&O x4 in triage

## 2023-04-26 NOTE — Discharge Instructions (Addendum)
 Meclizine /Antivert  up to 3 times per day as needed for vertigo  Please use lidocaine  patches at your site of pain.  Apply 1 patch at a time, leave on for 12 hours, then remove for 12 hours.  12 hours on, 12 hours off.  Do not apply more than 1 patch at a time.  Use Tylenol  for pain and fevers.  Up to 1000 mg per dose, up to 4 times per day.  Do not take more than 4000 mg of Tylenol /acetaminophen  within 24 hours..  Use Robaxin  muscle relaxer as needed for more severe/breakthrough pain, up to 3 times per day. This medication can make some people sleepy, so do not use while driving, working or designer, television/film set

## 2023-05-01 ENCOUNTER — Encounter: Payer: Self-pay | Admitting: Internal Medicine

## 2023-05-01 ENCOUNTER — Ambulatory Visit (INDEPENDENT_AMBULATORY_CARE_PROVIDER_SITE_OTHER): Payer: 59 | Admitting: Internal Medicine

## 2023-05-01 VITALS — BP 106/60 | HR 85 | Temp 97.9°F | Ht 63.0 in | Wt 229.6 lb

## 2023-05-01 DIAGNOSIS — J449 Chronic obstructive pulmonary disease, unspecified: Secondary | ICD-10-CM

## 2023-05-01 DIAGNOSIS — G4733 Obstructive sleep apnea (adult) (pediatric): Secondary | ICD-10-CM

## 2023-05-01 NOTE — Progress Notes (Unsigned)
SYNOPSIS [name] [name] is a 68 y.o. female former smoker ( 35 pack year history , quit 1995 ) with history of  COPD , allergies, and sleep apnea per in lab sleep study done 4-5 years ago. She had a CPAP machine, but she could not tolerate it. Consult to evaluate if pt. Is still + for OSA.    CC  Follow-up assessment for COPD Follow-up assessment for OSA   HPI Regarding COPD Patient COPD exacerbation several weeks ago was placed on antibiotics and prednisone Patient continues to use Trelegy 200-plan to start Breztri inhaler 2 puffs in a.m. 2 puffs in p.m. and see if this makes any difference in her breathing  No exacerbation at this time No evidence of heart failure at this time No evidence or signs of infection at this time No respiratory distress No fevers, chills, nausea, vomiting, diarrhea No evidence of lower extremity edema No evidence hemoptysis  Severe OSA Initial AHI of 60 which is very severe Patient has excellent compliance report Discussed in detail with patient OSA is well-controlled with CPAP Continue current prescription Auto CPAP 8-18 Excellent compliance 100% for days and greater than 4 hours AHI reduced to 1.9 previous AHI 60 Compliance report reviewed in detail with patient  Morbid obesity Patient on Mounjaro Last weight 250 pounds down to 226   Past Medical History:  Diagnosis Date   (HFpEF) heart failure with preserved ejection fraction (HCC)    a.) TTE 05/21/2016: EF 50%, mild LV dil, ant HK, mild RAE, G1DD; b.) TTE 06/06/2019: EF 60-65%, G2DD; c.) TTE 04/04/2021: EF 60-65%, mod LAE, mild RAE, mod MAC, mild MV stenosis, G2DD   Allergic rhinitis    Allergy    Aortic atherosclerosis (HCC)    Asthma    CHF (congestive heart failure) (HCC)    Chronic back pain    COPD (chronic obstructive pulmonary disease) (HCC)    Degenerative joint disease of knee, left    Depression    Diverticulosis    Dyspnea    Edema    GERD (gastroesophageal  reflux disease)    Headache    Hidradenitis    History of bilateral cataract extraction    History of colonic polyps    HLD (hyperlipidemia)    Hypertension    Hypothyroidism    IBS (irritable bowel syndrome)    IDA (iron deficiency anemia)    Insomnia    a.) takes trazodone PRN   Intraductal papilloma of breast, right    Mitral valve stenosis 04/04/2021   a.) TTE 04/04/2021: mild MS (MPG 5.0 mm Hg).   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 There is wall motion abnormality    suggestive of coronary artery disease and mild diastolic    dysfunction.   Sleep Study AHI 60       Latest  Ref Rng & Units 04/26/2023    5:09 PM 06/29/2022   11:51 AM 10/07/2019    2:16 PM  CBC  WBC 4.0 - 10.5 K/uL 8.6  6.3  21.4   Hemoglobin 12.0 - 15.0 g/dL 16.1  09.6  04.5   Hematocrit 36.0 - 46.0 % 43.5  43.6  41.9   Platelets 150 - 400 K/uL 204  213  275        Latest Ref Rng & Units 04/26/2023    5:09 PM 05/12/2021    3:18 PM 01/17/2021   11:33 AM  BMP  Glucose 70 - 99 mg/dL 409  811  914   BUN 8 - 23 mg/dL 18  33  18   Creatinine 0.44 - 1.00 mg/dL 7.82  9.56  2.13   Sodium 135 - 145 mmol/L 135  132  137   Potassium 3.5 - 5.1 mmol/L 4.0  4.4  4.3   Chloride 98 - 111 mmol/L 97  98  101   CO2 22 - 32 mmol/L 28  25  27    Calcium 8.9 - 10.3 mg/dL 9.4  9.2  9.3     BNP    Component Value Date/Time   BNP 36.0 05/20/2016 2155    ProBNP No  results found for: "PROBNP"  PFT No results found for: "FEV1PRE", "FEV1POST", "FVCPRE", "FVCPOST", "TLC", "DLCOUNC", "PREFEV1FVCRT", "PSTFEV1FVCRT"  CT HEAD WO CONTRAST ( ) Result Date: 04/26/2023 CLINICAL DATA:  persitent vertigo, feeling like falling to the left EXAM: CT HEAD WITHOUT CONTRAST TECHNIQUE: Contiguous axial images were obtained from the base of the skull through the vertex without intravenous contrast. RADIATION DOSE REDUCTION: This exam was performed according to the departmental dose-optimization program which includes automated exposure control, adjustment of the mA and/or kV according to patient size and/or use of iterative reconstruction technique. COMPARISON:  None Available. FINDINGS: Brain: No evidence of large-territorial acute infarction. No parenchymal hemorrhage. No mass lesion. No extra-axial collection. No mass effect or midline shift. No hydrocephalus. Basilar cisterns are patent. Vascular: No hyperdense vessel. Atherosclerotic calcifications are present within the cavernous internal carotid arteries. Skull: No acute fracture or focal lesion. Sinuses/Orbits: Left sphenoid sinus complete opacification with hypertrophy of the walls. Left maxillary sinus mucosal thickening. Otherwise paranasal sinuses and mastoid air cells are clear. Bilateral lens replacement. Otherwise the orbits are unremarkable. Other: None. IMPRESSION: 1. No acute intracranial abnormality. 2. Chronic left sphenoid sinusitis. Electronically Signed   By: Tish Frederickson M.D.   On: 04/26/2023 20:01     Past medical hx Past Medical History:  Diagnosis Date   (HFpEF) heart failure with preserved ejection fraction (HCC)    a.) TTE 05/21/2016: EF 50%, mild LV dil, ant HK, mild RAE, G1DD; b.) TTE 06/06/2019: EF 60-65%, G2DD; c.) TTE 04/04/2021: EF 60-65%, mod LAE, mild RAE, mod MAC, mild MV stenosis, G2DD   Allergic rhinitis    Allergy    Aortic atherosclerosis (HCC)    Asthma    CHF (congestive heart  failure) (HCC)    Chronic back pain    COPD (chronic obstructive pulmonary disease) (HCC)    Degenerative joint disease of knee, left    Depression    Diverticulosis    Dyspnea    Edema    GERD (gastroesophageal reflux disease)    Headache    Hidradenitis    History of bilateral cataract extraction    History of colonic polyps    HLD (hyperlipidemia)    Hypertension    Hypothyroidism  IBS (irritable bowel syndrome)    IDA (iron deficiency anemia)    Insomnia    a.) takes trazodone PRN   Intraductal papilloma of breast, right    Mitral valve stenosis 04/04/2021   a.) TTE 04/04/2021: mild MS (MPG 5.0 mm Hg).   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    Sinusitis, chronic    T2DM (type 2 diabetes mellitus) (HCC)    Vaginitis, atrophic    Vertigo    Vitamin D deficiency      Social History   Tobacco Use   Smoking status: Former    Current packs/day: 0.00    Average packs/day: 3.0 packs/day for 20.0 years (60.0 ttl pk-yrs)    Types: Cigarettes    Start date: 12/18/1973    Quit date: 12/18/1993    Years since quitting: 29.3    Passive exposure: Past   Smokeless tobacco: Never  Vaping Use   Vaping status: Never Used  Substance Use Topics   Alcohol use: No   Drug use: No    Types: Marijuana    Ms.Nelms reports that she quit smoking about 29 years ago. Her smoking use included cigarettes. She started smoking about 49 years ago. She has a 60 pack-year smoking history. She has been exposed to tobacco smoke. She has never used smokeless tobacco. She reports that she does not drink alcohol and does not use drugs.  Tobacco Cessation: Former smoker with a 35 pack year smoking history  Past surgical hx, Family hx, Social hx all  reviewed.  Current Outpatient Medications on File Prior to Visit  Medication Sig   acetaminophen (TYLENOL) 500 MG tablet Take 500 mg by mouth every 6 (six) hours as needed.   albuterol (PROVENTIL HFA;VENTOLIN HFA) 108 (90 Base) MCG/ACT inhaler Inhale 2 puffs into the lungs every 6 (six) hours as needed for wheezing or shortness of breath.   aspirin EC 81 MG tablet Take 81 mg by mouth daily.   atorvastatin (LIPITOR) 20 MG tablet Take 20 mg by mouth at bedtime.   BIOTIN MAXIMUM PO Take by mouth daily.   clobetasol (TEMOVATE) 0.05 % external solution Apply twice daily to scalp as needed for itching.   Colchicine 0.6 MG CAPS Take by mouth.   cyclobenzaprine (FLEXERIL) 10 MG tablet Take 10 mg by mouth at bedtime as needed.   doxepin (SINEQUAN) 50 MG capsule Take 50 mg by mouth at bedtime.   doxycycline (MONODOX) 100 MG capsule Take 100 mg by mouth 2 (two) times daily.   DULoxetine (CYMBALTA) 60 MG capsule Take 60 mg by mouth daily.   Epinastine HCl 0.05 % ophthalmic solution Apply 1 drop to eye 2 (two) times daily.   Fluocinolone Acetonide Body 0.01 % OIL Apply to scalp BID as needed until irritation resolves   fluticasone (FLONASE) 50 MCG/ACT nasal spray as needed.   Fluticasone-Umeclidin-Vilant (TRELEGY ELLIPTA) 200-62.5-25 MCG/ACT AEPB Inhale 1 Act into the lungs daily.   Fluticasone-Umeclidin-Vilant (TRELEGY ELLIPTA) 200-62.5-25 MCG/ACT AEPB Inhale 1 puff into the lungs daily.   gabapentin (NEURONTIN) 600 MG tablet Take 600 mg by mouth. 1 tablet in the am, 1 tablet in the pm and 2 tablets at night  Pt reports taking 1 tablet at  night   glucose blood test strip TEST three times a day   HUMULIN R U-500 KWIKPEN 500 UNIT/ML KwikPen Inject 3 mLs into the skin. 75 units in the am and 65 units in the evening   hydroxychloroquine (PLAQUENIL) 200 MG tablet Take 1 tablet by mouth 2 (two) times daily.   ibuprofen (ADVIL) 800 MG tablet Take 1 tablet (800 mg total) by mouth every 8 (eight) hours as  needed for moderate pain.   ibuprofen (ADVIL,MOTRIN) 800 MG tablet Take 1 tablet (800 mg total) by mouth every 8 (eight) hours as needed for mild pain or moderate pain (with food).   ipratropium (ATROVENT HFA) 17 MCG/ACT inhaler Inhale 2 puffs into the lungs every 6 (six) hours as needed for wheezing. Patient uses once a day   JARDIANCE 25 MG TABS tablet Take 25 mg by mouth daily.   ketoconazole (NIZORAL) 2 % shampoo SHAMPOO INTO SCALP AND LET SIT FOR 10 MINUTES AND THEN WASH OUT; USE 3 DAYS PER WEEK AS DIRECTED   Lancets Misc. (ACCU-CHEK FASTCLIX LANCET) KIT    letrozole (FEMARA) 2.5 MG tablet TAKE ONE TABLET BY MOUTH EVERY DAY   levocetirizine (XYZAL) 5 MG tablet TAKE 1 TABLET BY MOUTH EVERY EVENING   levothyroxine (SYNTHROID) 150 MCG tablet Take 150 mcg by mouth daily.   lidocaine (LIDODERM) 5 % Place 1 patch onto the skin every 12 (twelve) hours. Remove & Discard patch within 12 hours or as directed by MD   LINZESS 145 MCG CAPS capsule Take 145 mcg by mouth daily.   meclizine (ANTIVERT) 25 MG tablet Take 25 mg by mouth 2 (two) times daily as needed.   meclizine (ANTIVERT) 25 MG tablet Take 1 tablet (25 mg total) by mouth 3 (three) times daily as needed for dizziness.   Melatonin 5 MG CAPS Take 2 capsules by mouth daily.   methocarbamol (ROBAXIN) 500 MG tablet Take 1 tablet (500 mg total) by mouth every 8 (eight) hours as needed.   montelukast (SINGULAIR) 10 MG tablet Take 10 mg by mouth daily.   MOUNJARO 10 MG/0.5ML Pen Inject 10 mg into the skin once a week.   olopatadine (PATANOL) 0.1 % ophthalmic solution Place 1 drop into both eyes 2 (two) times daily.   omeprazole (PRILOSEC) 40 MG capsule Take 40 mg by mouth daily.   OXYGEN Inhale into the lungs. With CPAP   potassium citrate (UROCIT-K) 10 MEQ (1080 MG) SR tablet TAKE ONE TABLET BY MOUTH EVERY MORNING   predniSONE (DELTASONE) 20 MG tablet Take 20 mg by mouth daily.   roflumilast (DALIRESP) 500 MCG TABS tablet Take 500 mcg by mouth  daily.   silver sulfADIAZINE (SILVADENE) 1 % cream Apply 1 Application topically 2 (two) times daily.   torsemide (DEMADEX) 20 MG tablet TAKE ONE TABLET BY MOUTH DAILY   traZODone (DESYREL) 150 MG tablet Take 150 mg by mouth at bedtime.   triamcinolone cream (KENALOG) 0.1 % Apply twice daily to affected areas on arms and legs until smooth. Avoid applying to face, groin, and axilla.   No current facility-administered medications on file prior to visit.     Allergies  Allergen Reactions   Codeine Itching   Levofloxacin Rash    "welts" & itching   BP 106/60 (BP Location: Left Arm, Patient Position: Sitting, Cuff Size: Normal)   Pulse 85   Temp 97.9 F (36.6 C) (Temporal)   Ht 5\' 3"  (1.6 m)   Wt 229 lb 9.6 oz (104.1 kg)  SpO2 97%   BMI 40.67 kg/m     Review of Systems: Gen:  Denies  fever, sweats, chills weight loss  HEENT: Denies blurred vision, double vision, ear pain, eye pain, hearing loss, nose bleeds, sore throat Cardiac:  No dizziness, chest pain or heaviness, chest tightness,edema, No JVD Resp:   No cough, -sputum production, +shortness of breath,-wheezing, -hemoptysis,  Other:  All other systems negative   Physical Examination:   General Appearance: No distress  EYES PERRLA, EOM intact.   NECK Supple, No JVD Pulmonary: normal breath sounds, No wheezing.  CardiovascularNormal S1,S2.  No m/r/g.   Abdomen: Benign, Soft, non-tender. Neurology UE/LE 5/5 strength, no focal deficits Ext pulses intact, cap refill intact ALL OTHER ROS ARE NEGATIVE    Assessment/Plan   68 year old obese white female with severe OSA with AHI of 60 with chronic hypoxic respiratory failure in the setting of COPD   Assessment of Sleep apnea Patient has excellent compliance report Discussed in detail with patient OSA is well-controlled with CPAP Continue current prescription Auto CPAP 8-18  Patient Instructions Continue to use CPAP every night, minimum of 4-6 hours a night.   Change equipment every 30 days or as directed by DME.  Wash your tubing with warm soap and water daily, hang to dry. Wash humidifier portion weekly. Use bottled, distilled water and change daily   Be aware of reduced alertness and do not drive or operate heavy machinery if experiencing this or drowsiness.  Exercise encouraged, as tolerated. Encouraged proper weight management.  Important to get eight or more hours of sleep  Limiting the use of the computer and television before bedtime.  Decrease naps during the day, so night time sleep will become enhanced.  Limit caffeine, and sleep deprivation.  HTN, stroke, uncontrolled diabetes and heart failure are potential risk factors.  Risk of untreated sleep apnea including cardiac arrhthymias, stroke, DM, pulm HTN.    COPD Plan to switch to [facility] inhaler Please rinse mouth after every use Avoid Allergens and Irritants Avoid secondhand smoke Avoid SICK contacts Recommend  Masking  when appropriate Recommend Keep up-to-date with vaccinations  Chronic Hypoxic resp failure due to COPD Continue oxygen as prescribed Patient uses for survival  Obesity -recommend significant weight loss -recommend changing diet  Deconditioned state -Recommend increased daily activity and exercise   MEDICATION ADJUSTMENTS/LABS AND TESTS ORDERED: Lets plan to use Breztri inhaler 2 puffs in the morning 2 puffs at night Rinse mouth after every use Continue CPAP as prescribed Recommend weight loss Avoid Allergens and Irritants Avoid secondhand smoke Avoid SICK contacts Recommend  Masking  when appropriate Recommend Keep up-to-date with vaccinations   CURRENT MEDICATIONS REVIEWED AT LENGTH WITH PATIENT TODAY   Patient  satisfied with Plan of action and management. All questions answered   Follow up  3 months   I spent a total of 45 minutes reviewing chart data, face-to-face evaluation with the patient, counseling and coordination of care as  detailed above.      Lucie Leather, M.D.  [facility]  Medical Director [facility] Medical Director [facility] Cardio-Pulmonary Department

## 2023-05-01 NOTE — Patient Instructions (Addendum)
Lets plan to use Breztri inhaler 2 puffs in the morning 2 puffs at night Rinse mouth after every use  Excellent Job A+ GOLD STAR! Continue CPAP as prescribed  Recommend weight loss  Avoid Allergens and Irritants Avoid secondhand smoke Avoid SICK contacts Recommend  Masking  when appropriate Recommend Keep up-to-date with vaccinations   Be aware of reduced alertness and do not drive or operate heavy machinery if experiencing this or drowsiness.  Exercise encouraged, as tolerated. Encouraged proper weight management.  Important to get eight or more hours of sleep  Limiting the use of the computer and television before bedtime.  Decrease naps during the day, so night time sleep will become enhanced.  Limit caffeine, and sleep deprivation.

## 2023-05-10 ENCOUNTER — Inpatient Hospital Stay: Payer: 59 | Attending: Oncology | Admitting: Oncology

## 2023-05-14 ENCOUNTER — Ambulatory Visit: Payer: 59 | Admitting: Cardiology

## 2023-05-16 ENCOUNTER — Telehealth: Payer: Self-pay | Admitting: Internal Medicine

## 2023-05-16 ENCOUNTER — Telehealth: Payer: Self-pay | Admitting: Cardiology

## 2023-05-16 MED ORDER — BREZTRI AEROSPHERE 160-9-4.8 MCG/ACT IN AERO: 2.0000 | INHALATION_SPRAY | Freq: Two times a day (BID) | RESPIRATORY_TRACT | 11 refills | Status: AC

## 2023-05-16 NOTE — Telephone Encounter (Signed)
 I have sent in the [facility] to her pharmacy. I tried to contact the patient but her phone just rang. I will try again later.

## 2023-05-16 NOTE — Telephone Encounter (Signed)
 Patient is enjoying her Breztri samples and would like to be prescribed Breztri.   Pharmacy:[facility]

## 2023-05-16 NOTE — Telephone Encounter (Signed)
 Called and spoke with patient. Clarified with patient that it was Dr. Belia Heman from pulmonologist that had given her the sample. Patient states that she will call his office for prescription.

## 2023-05-16 NOTE — Telephone Encounter (Signed)
 Pt c/o medication issue:  1. Name of Medication: Breztri   2. How are you currently taking this medication (dosage and times per day)? N/A  3. Are you having a reaction (difficulty breathing--STAT)? No  4. What is your medication issue? Patient states Dr. Azucena Cecil gave her a sample of this inhaler and advised her to call the office for a prescription to be sent to her pharmacy if she likes it. She would like a prescription to [facility] sent to [facility]. Please advise.

## 2023-05-17 ENCOUNTER — Telehealth: Payer: Self-pay | Admitting: Cardiology

## 2023-05-17 NOTE — Telephone Encounter (Signed)
 Pt contacted about surgical clearance and the need for an IN OFFICE appointment.  She is currently scheduled to see Dr. Azucena Cecil 4/8/5, and she is ok with waiting until then and have procedure scheduled for after that.  Clearance will be addressed 06/26/23.  Will route to the requesting surgeon's office to make them aware.

## 2023-05-17 NOTE — Telephone Encounter (Signed)
   Pre-operative Risk Assessment    Patient Name: [name] [name]  DOB: 25-Mar-1955 MRN: [id number]   Date of last office visit: 04/21/22 Date of next office visit: 06/26/23   Request for Surgical Clearance    Procedure:  Right fifth hammertoe repair, tailors bunionectomy  Date of Surgery:  Clearance TBD                                Surgeon:  Dr. Excell Seltzer Surgeon's Group or Practice Name:  [facility] Phone number:  [phone] Fax number:  [phone]   Type of Clearance Requested:   - Medical    Type of Anesthesia:  Not Indicated   Additional requests/questions:    SignedNarda Amber   05/17/2023, 8:39 AM

## 2023-05-17 NOTE — Telephone Encounter (Signed)
   Name: [name] [name]  DOB: 02-21-1956  MRN: [id number]  Primary Cardiologist: Debbe Odea, MD  Chart reviewed as part of pre-operative protocol coverage. Because of [name] [name]'s past medical history and time since last visit, she will require a follow-up in-office visit in order to better assess preoperative cardiovascular risk. Pt is overdue for follow-up (last seen 04/2022).   Pre-op covering staff: - Please schedule appointment and call patient to inform them. If patient already had an upcoming appointment within acceptable timeframe, please add "pre-op clearance" to the appointment notes so provider is aware. - Please contact requesting surgeon's office via preferred method (i.e, phone, fax) to inform them of need for appointment prior to surgery.   Joylene Grapes, NP  05/17/2023, 1:14 PM

## 2023-05-17 NOTE — Telephone Encounter (Signed)
 Patient advised that [name] [name] has been sent to pharmacy. Nothing further needed.

## 2023-05-21 ENCOUNTER — Ambulatory Visit: Payer: 59 | Admitting: Oncology

## 2023-05-21 ENCOUNTER — Telehealth: Payer: Self-pay | Admitting: Family

## 2023-05-21 NOTE — Telephone Encounter (Signed)
 Lvm to confirm appt for 05/22/23

## 2023-05-22 ENCOUNTER — Encounter: Payer: Self-pay | Admitting: Family

## 2023-05-22 ENCOUNTER — Ambulatory Visit: Payer: 59 | Attending: Family | Admitting: Family

## 2023-05-22 VITALS — BP 113/56 | HR 83 | Wt 228.8 lb

## 2023-05-22 DIAGNOSIS — E119 Type 2 diabetes mellitus without complications: Secondary | ICD-10-CM | POA: Diagnosis not present

## 2023-05-22 DIAGNOSIS — F32A Depression, unspecified: Secondary | ICD-10-CM | POA: Insufficient documentation

## 2023-05-22 DIAGNOSIS — Z794 Long term (current) use of insulin: Secondary | ICD-10-CM | POA: Diagnosis not present

## 2023-05-22 DIAGNOSIS — E1151 Type 2 diabetes mellitus with diabetic peripheral angiopathy without gangrene: Secondary | ICD-10-CM | POA: Insufficient documentation

## 2023-05-22 DIAGNOSIS — I5032 Chronic diastolic (congestive) heart failure: Secondary | ICD-10-CM | POA: Diagnosis not present

## 2023-05-22 DIAGNOSIS — Z79899 Other long term (current) drug therapy: Secondary | ICD-10-CM | POA: Diagnosis not present

## 2023-05-22 DIAGNOSIS — Z7985 Long-term (current) use of injectable non-insulin antidiabetic drugs: Secondary | ICD-10-CM | POA: Diagnosis not present

## 2023-05-22 DIAGNOSIS — I428 Other cardiomyopathies: Secondary | ICD-10-CM | POA: Diagnosis not present

## 2023-05-22 DIAGNOSIS — M2041 Other hammer toe(s) (acquired), right foot: Secondary | ICD-10-CM | POA: Insufficient documentation

## 2023-05-22 DIAGNOSIS — M549 Dorsalgia, unspecified: Secondary | ICD-10-CM | POA: Diagnosis not present

## 2023-05-22 DIAGNOSIS — E785 Hyperlipidemia, unspecified: Secondary | ICD-10-CM | POA: Insufficient documentation

## 2023-05-22 DIAGNOSIS — Z9981 Dependence on supplemental oxygen: Secondary | ICD-10-CM | POA: Insufficient documentation

## 2023-05-22 DIAGNOSIS — K219 Gastro-esophageal reflux disease without esophagitis: Secondary | ICD-10-CM | POA: Diagnosis not present

## 2023-05-22 DIAGNOSIS — I10 Essential (primary) hypertension: Secondary | ICD-10-CM | POA: Diagnosis not present

## 2023-05-22 DIAGNOSIS — G4733 Obstructive sleep apnea (adult) (pediatric): Secondary | ICD-10-CM | POA: Diagnosis not present

## 2023-05-22 DIAGNOSIS — J449 Chronic obstructive pulmonary disease, unspecified: Secondary | ICD-10-CM | POA: Diagnosis not present

## 2023-05-22 DIAGNOSIS — I110 Hypertensive heart disease with heart failure: Secondary | ICD-10-CM | POA: Diagnosis not present

## 2023-05-22 DIAGNOSIS — M1712 Unilateral primary osteoarthritis, left knee: Secondary | ICD-10-CM | POA: Insufficient documentation

## 2023-05-22 DIAGNOSIS — R0602 Shortness of breath: Secondary | ICD-10-CM | POA: Insufficient documentation

## 2023-05-22 DIAGNOSIS — Z7984 Long term (current) use of oral hypoglycemic drugs: Secondary | ICD-10-CM | POA: Diagnosis not present

## 2023-05-22 DIAGNOSIS — J4489 Other specified chronic obstructive pulmonary disease: Secondary | ICD-10-CM | POA: Diagnosis not present

## 2023-05-22 NOTE — Progress Notes (Signed)
 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   Was in the ED 04/26/23 with back pain and weakness. Head CT negative.   Echo 05/21/16: RF of 50% along with trivial MR/TR/PR.  Echo 06/06/19: EF of 60-65% Echo 04/04/21: EF of 60-65%, Left atrial size was moderately dilated.   She presents today for a HF follow-up visit with a chief complaint of shortness of breath. Has associated fatigue (improving) and chronic back pain along with this. Denies chest pain, cough, palpitations, abdominal distention, pedal edema, dizziness, difficulty sleeping or weight gain. Has been walking for exercise. Using mounjaro with good weight loss results. Sleeping well on 1 pillow with oxygen and CPAP nightly.   Right hammertoe/ bunion present that she will be needing surgery to correct it.    ROS: All systems negative except as listed in HPI, PMH and Problem List.  SH:  Social History   Socioeconomic History   Marital status: Divorced    Spouse name: Not on file   Number of children: Not on file   Years of education: Not on file   Highest education level: Not on file  Occupational History   Occupation: disabled  Tobacco Use   Smoking status: Former    Current packs/day: 0.00    Average packs/day: 3.0 packs/day for 20.0 years (60.0 ttl pk-yrs)    Types: Cigarettes    Start date: 12/18/1973    Quit date: 12/18/1993    Years since quitting: 29.4    Passive exposure: Past   Smokeless tobacco: Never  Vaping Use   Vaping status: Never Used  Substance and Sexual  Activity   Alcohol use: No   Drug use: No    Types: Marijuana   Sexual activity: Never    Birth control/protection: Abstinence  Other Topics Concern   Not on file  Social History Narrative   Lives alone   Social Drivers of Health   Financial Resource Strain: Low Risk  (03/09/2017)   Overall Financial Resource Strain (CARDIA)    Difficulty of Paying Living Expenses: Not hard at all  Food Insecurity: Unknown (03/09/2017)   Hunger Vital Sign    Worried About Running Out of Food in the Last Year: Patient declined    Ran Out of Food in the Last Year: Patient declined  Transportation Needs: Unknown (03/09/2017)   PRAPARE - Administrator, Civil Service (Medical): Patient declined    Lack of Transportation (Non-Medical): Patient declined  Physical Activity: Unknown (03/09/2017)   Exercise Vital Sign    Days of Exercise per Week: Patient declined    Minutes of Exercise per Session: Patient declined  Stress: No Stress Concern Present (03/09/2017)   [employer] of Occupational Health - Occupational Stress Questionnaire    Feeling of Stress : Not at all  Social Connections: Somewhat Isolated (03/09/2017)   Social Connection and Isolation Panel [NHANES]    Frequency of Communication with Friends and Family: More than three times a week    Frequency of Social Gatherings with Friends and Family: Once a week    Attends Religious Services:  More than 4 times per year    Active Member of Clubs or Organizations: No    Attends Banker Meetings: Never    Marital Status: Divorced  Catering manager Violence: Unknown (03/09/2017)   Humiliation, Afraid, Rape, and Kick questionnaire    Fear of Current or Ex-Partner: Patient declined    Emotionally Abused: Patient declined    Physically Abused: Patient declined    Sexually Abused: Patient declined    FH:  Family History  Problem Relation Age of Onset   Rashes / Skin problems Father    Hypertension Father     Heart disease Father    Breast cancer Paternal Grandmother    COPD Mother    Kidney cancer Neg Hx    Bladder Cancer Neg Hx     Past Medical History:  Diagnosis Date   (HFpEF) heart failure with preserved ejection fraction (HCC)    a.) TTE 05/21/2016: EF 50%, mild LV dil, ant HK, mild RAE, G1DD; b.) TTE 06/06/2019: EF 60-65%, G2DD; c.) TTE 04/04/2021: EF 60-65%, mod LAE, mild RAE, mod MAC, mild MV stenosis, G2DD   Allergic rhinitis    Allergy    Aortic atherosclerosis (HCC)    Asthma    CHF (congestive heart failure) (HCC)    Chronic back pain    COPD (chronic obstructive pulmonary disease) (HCC)    Degenerative joint disease of knee, left    Depression    Diverticulosis    Dyspnea    Edema    GERD (gastroesophageal reflux disease)    Headache    Hidradenitis    History of bilateral cataract extraction    History of colonic polyps    HLD (hyperlipidemia)    Hypertension    Hypothyroidism    IBS (irritable bowel syndrome)    IDA (iron deficiency anemia)    Insomnia    a.) takes trazodone PRN   Intraductal papilloma of breast, right    Mitral valve stenosis 04/04/2021   a.) TTE 04/04/2021: mild [name] (MPG 5.0 mm Hg).   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     albuterol (PROVENTIL HFA;VENTOLIN HFA) 108 (90 Base) MCG/ACT inhaler Inhale 2 puffs into the lungs every 6 (six) hours as needed for wheezing or shortness of breath.     aspirin EC 81 MG tablet Take 81 mg by  mouth daily.     atorvastatin (LIPITOR) 20 MG tablet Take 20 mg by mouth at bedtime.     BIOTIN MAXIMUM PO Take by mouth daily.     Budeson-Glycopyrrol-Formoterol (BREZTRI AEROSPHERE) 160-9-4.8 MCG/ACT AERO Inhale 2 puffs into the lungs in the morning and at bedtime. 10.7 g 11   clobetasol (TEMOVATE) 0.05 % external solution Apply twice daily to scalp as needed for itching. 50 mL 3   Colchicine 0.6 MG CAPS Take by mouth.     cyclobenzaprine (FLEXERIL)  10 MG tablet Take 10 mg by mouth at bedtime as needed.     doxepin (SINEQUAN) 50 MG capsule Take 50 mg by mouth at bedtime.     doxycycline (MONODOX) 100 MG capsule Take 100 mg by mouth 2 (two) times daily.     DULoxetine (CYMBALTA) 60 MG capsule Take 60 mg by mouth daily.     Epinastine HCl 0.05 % ophthalmic solution Apply 1 drop to eye 2 (two) times daily.     Fluocinolone Acetonide Body 0.01 % OIL Apply to scalp BID as needed until irritation resolves 120 mL 5   fluticasone (FLONASE) 50 MCG/ACT nasal spray as needed.     gabapentin (NEURONTIN) 600 MG tablet Take 600 mg by mouth. 1 tablet in the am, 1 tablet in the pm and 2 tablets at night  Pt reports taking 1 tablet at night     glucose blood test strip TEST three times a day     HUMULIN R U-500 KWIKPEN 500 UNIT/ML KwikPen Inject 3 mLs into the skin. 75 units in the am and 65 units in the evening     hydroxychloroquine (PLAQUENIL) 200 MG tablet Take 1 tablet by mouth 2 (two) times daily.     ibuprofen (ADVIL) 800 MG tablet Take 1 tablet (800 mg total) by mouth every 8 (eight) hours as needed for moderate pain. 60 tablet 1   ibuprofen (ADVIL,MOTRIN) 800 MG tablet Take 1 tablet (800 mg total) by mouth every 8 (eight) hours as needed for mild pain or moderate pain (with food). 20 tablet 0   ipratropium (ATROVENT HFA) 17 MCG/ACT inhaler Inhale 2 puffs into the lungs every 6 (six) hours as needed for wheezing. Patient uses once a day     JARDIANCE 25 MG TABS tablet Take 25 mg by mouth daily.      ketoconazole (NIZORAL) 2 % shampoo SHAMPOO INTO SCALP AND LET SIT FOR 10 MINUTES AND THEN WASH OUT; USE 3 DAYS PER WEEK AS DIRECTED 120 mL 11   Lancets Misc. (ACCU-CHEK FASTCLIX LANCET) KIT      letrozole (FEMARA) 2.5 MG tablet TAKE ONE TABLET BY MOUTH EVERY DAY 90 tablet 3   levocetirizine (XYZAL) 5 MG tablet TAKE 1 TABLET BY MOUTH EVERY EVENING 30 tablet 5   levothyroxine (SYNTHROID) 150 MCG tablet Take 150 mcg by mouth daily.     lidocaine (LIDODERM) 5 % Place 1 patch onto the skin every 12 (twelve) hours. Remove & Discard patch within 12 hours or as directed by MD 10 patch 0   LINZESS 145 MCG CAPS capsule Take 145 mcg by mouth daily.     meclizine (ANTIVERT) 25 MG tablet Take 25 mg by mouth 2 (two) times daily as needed.     meclizine (ANTIVERT) 25 MG tablet Take 1 tablet (25 mg total) by mouth 3 (three) times daily as needed for dizziness. 30 tablet 0   Melatonin 5 MG CAPS Take 2 capsules by mouth daily.     methocarbamol (ROBAXIN) 500 MG tablet Take 1 tablet (500 mg total) by mouth every 8 (eight) hours as needed. 30 tablet 0   montelukast (SINGULAIR) 10 MG tablet Take 10 mg by mouth daily.     MOUNJARO 10 MG/0.5ML Pen Inject 10 mg into the skin once a week.     olopatadine (PATANOL) 0.1 % ophthalmic solution Place 1 drop into both eyes 2 (two) times daily.     omeprazole (PRILOSEC) 40 MG capsule Take 40 mg by mouth daily.  OXYGEN Inhale into the lungs. With CPAP     potassium citrate (UROCIT-K) 10 MEQ (1080 MG) SR tablet TAKE ONE TABLET BY MOUTH EVERY MORNING 90 tablet 3   predniSONE (DELTASONE) 20 MG tablet Take 20 mg by mouth daily.     roflumilast (DALIRESP) 500 MCG TABS tablet Take 500 mcg by mouth daily.     silver sulfADIAZINE (SILVADENE) 1 % cream Apply 1 Application topically 2 (two) times daily. 50 g 3   torsemide (DEMADEX) 20 MG tablet TAKE ONE TABLET BY MOUTH DAILY 90 tablet 3   traZODone (DESYREL) 150 MG tablet Take 150 mg by mouth at bedtime.     triamcinolone cream  (KENALOG) 0.1 % Apply twice daily to affected areas on arms and legs until smooth. Avoid applying to face, groin, and axilla. 454 g 2   No current facility-administered medications for this visit.   Vitals:   05/22/23 1331  BP: (!) 113/56  Pulse: 83  SpO2: 94%  Weight: 228 lb 12.8 oz (103.8 kg)   Wt Readings from Last 3 Encounters:  05/22/23 228 lb 12.8 oz (103.8 kg)  05/01/23 229 lb 9.6 oz (104.1 kg)  04/26/23 227 lb (103 kg)   Lab Results  Component Value Date   CREATININE 1.27 (H) 04/26/2023   CREATININE 1.16 (H) 05/12/2021   CREATININE 1.08 (H) 01/17/2021    PHYSICAL EXAM:  General: Well appearing. No resp difficulty HEENT: normal Neck: supple, no JVD Cor: Regular rhythm, rate. No rubs, gallops or murmurs Lungs: expiratory wheezing throughout lung fields Abdomen: soft, nontender, nondistended. Extremities: no cyanosis, clubbing, rash, edema Neuro: alert & oriented X 3. Moves all 4 extremities w/o difficulty. Affect pleasant   ECG: not done   ASSESSMENT & PLAN:  1:NICM with preserved ejection fraction- - suspect due to HTN/ COPD - NYHA class II - euvolemic today - weighing daily; reminded to call for an overnight weight gain of 2 lbs or a weekly weight gain of 5 lbs - weight down 17 pounds from last visit here 6 months ago - Echo 05/21/16: EF of 50% along with trivial MR/TR/PR.  - Echo 06/06/19: EF of 60-65% - Echo 04/04/21: EF of 60-65%, Left atrial size was moderately dilated - will get echo updated for upcoming cardiology visit next month - not adding salt and is using a salt substitute. Using meals on wheels for food; reminded her to follow a 2000 mg sodium diet - saw cardiology (Agbor-Etang) 02/24; returns 04/25 - continue jardiance 25mg  daily (DM dose) - continue torsemide 20mg  daily/ potassium daily - entresto had previously been stopped due to hypotension - BP may not tolerate spironolactone - wearing CPAP nightly - BNP 05/20/16 was 36.0  2:  HTN- - BP 113/56 - follows with PCP at [facility] (last seen 2-3 weeks ago) - BMP 04/26/23 reviewed and showed sodium 135, potassium 4.0, creatinine 1.27 and GFR 47  3: Diabetes- - A1C reviewed on 05/16/23 was 7.8% - saw endocrinologist (Solum) 06/19 - continue jardiance 25mg  daily - continue mounjaro 10mg  weekly - continue atorvastatin 20mg  daily   4: COPD- - wears oxygen at 2L at bedtime along with CPAP - saw pulmonologist (Kasa) 08/24; returns 05/25   Return in 6 months, sooner if needed.  Delma Freeze, FNP 05/22/23

## 2023-05-22 NOTE — Patient Instructions (Signed)
  Testing/Procedures:  Please have your echo completed. You will check in for this at the [facility]. You have to arrive 15 MINS EARLY for preparation, otherwise you will have to reschedule.  Your physician has requested that you have an echocardiogram. Echocardiography is a painless test that uses sound waves to create images of your heart. It provides your doctor with information about the size and shape of your heart and how well your heart's chambers and valves are working. This procedure takes approximately one hour. There are no restrictions for this procedure. Please do NOT wear cologne, perfume, aftershave, or lotions (deodorant is allowed). Please arrive 15 minutes prior to your appointment time.  Please note: We ask at that you not bring children with you during ultrasound (echo/ vascular) testing. Due to room size and safety concerns, children are not allowed in the ultrasound rooms during exams. Our front office staff cannot provide observation of children in our lobby area while testing is being conducted. An adult accompanying a patient to their appointment will only be allowed in the ultrasound room at the discretion of the ultrasound technician under special circumstances. We apologize for any inconvenience.  Special Instructions // Education:  Do the following things EVERYDAY: Weigh yourself in the morning before breakfast. Write it down and keep it in a log. Take your medicines as prescribed Eat low salt foods--Limit salt (sodium) to 2000 mg per day.  Stay as active as you can everyday Limit all fluids for the day to less than 2 liters   Follow-Up in: Please follow up with the Advanced Heart Failure Clinic in 6 months. This has been scheduled for you.  At the Advanced [facility] Clinic, you and your health needs are our priority. We have a designated team specialized in the treatment of Heart Failure. This Care Team includes your primary Heart Failure Specialized  Cardiologist (physician), Advanced Practice Providers (APPs- Physician Assistants and Nurse Practitioners), and Pharmacist who all work together to provide you with the care you need, when you need it.   You may see any of the following providers on your designated Care Team at your next follow up:  Dr. Arvilla Meres Dr. Marca Ancona Dr. Dorthula Nettles Dr. Theresia Bough Tonye Becket, NP Robbie Lis, [place] [facility] [place], [place] Brynda Peon, NP [place] Lee, NP Karle Plumber, PharmD   Please be sure to bring in all your medications bottles to every appointment.   Need to Contact us:  If you have any questions or concerns before your next appointment please send [place] a message through [place] or call our office at [phone].    TO LEAVE A MESSAGE FOR THE NURSE SELECT OPTION 2, PLEASE LEAVE A MESSAGE INCLUDING: YOUR NAME DATE OF BIRTH CALL BACK NUMBER REASON FOR CALL**this is important as we prioritize the call backs  YOU WILL RECEIVE A CALL BACK THE SAME DAY AS LONG AS YOU CALL BEFORE 4:00 PM

## 2023-06-04 ENCOUNTER — Inpatient Hospital Stay: Payer: 59 | Attending: Oncology | Admitting: Oncology

## 2023-06-04 ENCOUNTER — Encounter: Payer: Self-pay | Admitting: Oncology

## 2023-06-04 VITALS — BP 117/59 | HR 86 | Temp 96.7°F | Resp 20 | Ht 63.0 in | Wt 228.0 lb

## 2023-06-04 DIAGNOSIS — M8580 Other specified disorders of bone density and structure, unspecified site: Secondary | ICD-10-CM | POA: Insufficient documentation

## 2023-06-04 DIAGNOSIS — D0511 Intraductal carcinoma in situ of right breast: Secondary | ICD-10-CM | POA: Diagnosis present

## 2023-06-04 DIAGNOSIS — Z79811 Long term (current) use of aromatase inhibitors: Secondary | ICD-10-CM | POA: Diagnosis not present

## 2023-06-04 DIAGNOSIS — Z87891 Personal history of nicotine dependence: Secondary | ICD-10-CM | POA: Insufficient documentation

## 2023-06-04 NOTE — Progress Notes (Signed)
 Survivorship Care Plan visit completed.  Treatment summary reviewed and given to patient.  ASCO answers booklet reviewed and given to patient.  CARE program and Cancer Transitions discussed with patient along with other resources cancer center offers to patients and caregivers.  Patient verbalized understanding.

## 2023-06-04 NOTE — Progress Notes (Signed)
 [facility]  Telephone:(336) [phone] Fax:(336) [phone]  ID: [name] [name] OB: 1955-05-26  MR#: [id number]  WUX#:[id number]  Patient Care Team: Hillery Aldo, MD as PCP - General (Family Medicine) Debbe Odea, MD as PCP - Cardiology (Cardiology) Delma Freeze, FNP as Nurse Practitioner (Family Medicine) Mertie Moores, MD as Consulting Physician (Pulmonary Disease) Lady Gary Darlin Priestly, MD as Consulting Physician (Cardiology) Hulen Luster, RN as Oncology Nurse Navigator Carmina Miller, MD as Consulting Physician (Radiation Oncology) Jeralyn Ruths, MD as Consulting Physician (Oncology) Henrene Dodge, MD as Consulting Physician (General Surgery)  CHIEF COMPLAINT: DCIS, right breast.  INTERVAL HISTORY: Patient returns to clinic today for routine 62-month evaluation.  She currently feels well and is at her baseline.  She is tolerating letrozole without significant side effects. She has no neurologic complaints.  She denies any recent fevers or illnesses.  She has a good appetite and denies weight loss.  She has no chest pain, shortness of breath, cough, or hemoptysis.  She denies any nausea, vomiting, constipation, or diarrhea.  She has no urinary complaints.  Patient offers no specific complaints today.  REVIEW OF SYSTEMS:   Review of Systems  Constitutional: Negative.  Negative for fever, malaise/fatigue and weight loss.  Respiratory: Negative.  Negative for cough, hemoptysis and shortness of breath.   Cardiovascular: Negative.  Negative for chest pain and leg swelling.  Gastrointestinal: Negative.  Negative for abdominal pain.  Genitourinary: Negative.  Negative for dysuria.  Musculoskeletal: Negative.  Negative for back pain.  Skin: Negative.  Negative for rash.  Neurological: Negative.  Negative for dizziness, focal weakness, weakness and headaches.  Psychiatric/Behavioral: Negative.  The patient is not nervous/anxious.     As per HPI. Otherwise, a  complete review of systems is negative.  PAST MEDICAL HISTORY: Past Medical History:  Diagnosis Date   (HFpEF) heart failure with preserved ejection fraction (HCC)    a.) TTE 05/21/2016: EF 50%, mild LV dil, ant HK, mild RAE, G1DD; b.) TTE 06/06/2019: EF 60-65%, G2DD; c.) TTE 04/04/2021: EF 60-65%, mod LAE, mild RAE, mod MAC, mild MV stenosis, G2DD   Allergic rhinitis    Allergy    Aortic atherosclerosis (HCC)    Asthma    CHF (congestive heart failure) (HCC)    Chronic back pain    COPD (chronic obstructive pulmonary disease) (HCC)    Degenerative joint disease of knee, left    Depression    Diverticulosis    Dyspnea    Edema    GERD (gastroesophageal reflux disease)    Headache    Hidradenitis    History of bilateral cataract extraction    History of colonic polyps    HLD (hyperlipidemia)    Hypertension    Hypothyroidism    IBS (irritable bowel syndrome)    IDA (iron deficiency anemia)    Insomnia    a.) takes trazodone PRN   Intraductal papilloma of breast, right    Mitral valve stenosis 04/04/2021   a.) TTE 04/04/2021: mild MS (MPG 5.0 mm Hg).   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    Sinusitis, chronic    T2DM (type 2 diabetes mellitus) (HCC)    Vaginitis, atrophic  Vertigo    Vitamin D deficiency     PAST SURGICAL HISTORY: Past Surgical History:  Procedure Laterality Date   ABDOMINAL HYSTERECTOMY     AXILLARY HIDRADENITIS EXCISION     BREAST BIOPSY Right 02/22/2022   stereo bx, calcs, "X" clip- ATYPICAL INTRADUCTAL PAPILLARY PROLIFERATION WITH SCLEROSIS AND CALCIFICATION PROLIFERATION WITH SCLEROSIS AND CALCIFICATION   BREAST BIOPSY Right 02/22/2022   MM RT BREAST BX W LOC DEV 1ST LESION IMAGE BX SPEC STEREO GUIDE 02/22/2022  [facility]   BREAST BIOPSY Right 04/04/2022   rt br calcs coil clip, PASH   BREAST BIOPSY Right 04/04/2022   MM RT BREAST BX W LOC DEV 1ST LESION IMAGE BX SPEC STEREO GUIDE 04/04/2022 [facility]   BREAST BIOPSY Right 04/26/2022   MM RT RADIO FREQUENCY TAG EA ADD LESION LOC MAMMO GUIDE 04/26/2022 [facility]   BREAST BIOPSY Right 04/26/2022   MM RT RADIO FREQUENCY TAG LOC MAMMO GUIDE 04/26/2022 [facility]   BREAST EXCISIONAL BIOPSY Bilateral 12/2015   RUPTURED FOLLICULAR CYSTS WITH ABSCESSES AND SCARRING, CONSISTENT WITH HIDRADENITIS SUPPURATIVA.    BREAST LUMPECTOMY WITH RADIOFREQUENCY TAG IDENTIFICATION Right 05/09/2022   Procedure: BREAST LUMPECTOMY WITH RADIOFREQUENCY TAG IDENTIFICATION;  Surgeon: Henrene Dodge, MD;  Location: [facility];  Service: General;  Laterality: Right;   CATARACT EXTRACTION W/PHACO Right 07/27/2020   Procedure: CATARACT EXTRACTION PHACO AND INTRAOCULAR LENS PLACEMENT (IOC) RIGHT DIABETIC 17.13 01:23.9;  Surgeon: Galen Manila, MD;  Location: [facility];  Service: Ophthalmology;  Laterality: Right   CATARACT EXTRACTION W/PHACO Left 04/11/2022   Procedure: CATARACT EXTRACTION PHACO AND INTRAOCULAR LENS PLACEMENT (IOC) LEFT DIABETIC 15.05 01:18.6;  Surgeon: Galen Manila, MD;  Location: [facility];  Service: Ophthalmology;  Laterality: Left   CESAREAN SECTION     x 2   CHOLECYSTECTOMY     COLONOSCOPY WITH PROPOFOL N/A 09/15/2021   Procedure: COLONOSCOPY WITH PROPOFOL;  Surgeon: Toney Reil, MD;  Location: [facility];  Service: Gastroenterology;  Laterality: N/A;   HEEL SPUR EXCISION N/A    HYDRADENITIS EXCISION Right 12/31/2015   Procedure: EXCISION HIDRADENITIS AXILLA;  Surgeon: Ricarda Frame, MD;  Location: [facility];  Service: General;  Laterality: Right;   KNEE SURGERY Left    1998   TONSILLECTOMY      FAMILY HISTORY: Family History  Problem Relation Age of Onset   Rashes / Skin problems Father     Hypertension Father    Heart disease Father    Breast cancer Paternal Grandmother    COPD Mother    Kidney cancer Neg Hx    Bladder Cancer Neg Hx     ADVANCED DIRECTIVES (Y/N):  N  HEALTH MAINTENANCE: Social History   Tobacco Use   Smoking status: Former    Current packs/day: 0.00    Average packs/day: 3.0 packs/day for 20.0 years (60.0 ttl pk-yrs)    Types: Cigarettes    Start date: 12/18/1973    Quit date: 12/18/1993    Years since quitting: 29.4    Passive exposure: Past   Smokeless tobacco: Never  Vaping Use   Vaping status: Never Used  Substance Use Topics   Alcohol use: No   Drug use: No    Types: Marijuana     Colonoscopy:  PAP:  Bone density:  Lipid panel:  Allergies  Allergen Reactions   Codeine Itching   Levofloxacin Rash    "welts" & itching    Current Outpatient Medications  Medication Sig Dispense Refill   acetaminophen (TYLENOL) 500 MG tablet Take 500  mg by mouth every 6 (six) hours as needed.     albuterol (PROVENTIL HFA;VENTOLIN HFA) 108 (90 Base) MCG/ACT inhaler Inhale 2 puffs into the lungs every 6 (six) hours as needed for wheezing or shortness of breath.     aspirin EC 81 MG tablet Take 81 mg by mouth daily.     atorvastatin (LIPITOR) 20 MG tablet Take 20 mg by mouth at bedtime.     Budeson-Glycopyrrol-Formoterol (BREZTRI AEROSPHERE) 160-9-4.8 MCG/ACT AERO Inhale 2 puffs into the lungs in the morning and at bedtime. 10.7 g 11   clobetasol (TEMOVATE) 0.05 % external solution Apply twice daily to scalp as needed for itching. 50 mL 3   Colchicine 0.6 MG CAPS Take by mouth.     cyclobenzaprine (FLEXERIL) 10 MG tablet Take 10 mg by mouth at bedtime as needed.     doxepin (SINEQUAN) 50 MG capsule Take 50 mg by mouth at bedtime.     DULoxetine (CYMBALTA) 60 MG capsule Take 60 mg by mouth daily.     Fluocinolone Acetonide Body 0.01 % OIL Apply to scalp BID as needed until irritation resolves 120 mL 5   fluticasone (FLONASE) 50 MCG/ACT nasal spray as  needed.     gabapentin (NEURONTIN) 600 MG tablet Take 600 mg by mouth. 1 tablet in the am, 1 tablet in the pm and 2 tablets at night  Pt reports taking 1 tablet at night     glucose blood test strip TEST three times a day     HUMULIN R U-500 KWIKPEN 500 UNIT/ML KwikPen Inject 3 mLs into the skin. 75 units in the am and 65 units in the evening     hydroxychloroquine (PLAQUENIL) 200 MG tablet Take 1 tablet by mouth 2 (two) times daily.     ipratropium (ATROVENT HFA) 17 MCG/ACT inhaler Inhale 2 puffs into the lungs every 6 (six) hours as needed for wheezing. Patient uses once a day     JARDIANCE 25 MG TABS tablet Take 25 mg by mouth daily.     ketoconazole (NIZORAL) 2 % shampoo SHAMPOO INTO SCALP AND LET SIT FOR 10 MINUTES AND THEN WASH OUT; USE 3 DAYS PER WEEK AS DIRECTED 120 mL 11   Lancets Misc. (ACCU-CHEK FASTCLIX LANCET) KIT      letrozole (FEMARA) 2.5 MG tablet TAKE ONE TABLET BY MOUTH EVERY DAY 90 tablet 3   levocetirizine (XYZAL) 5 MG tablet TAKE 1 TABLET BY MOUTH EVERY EVENING 30 tablet 5   levothyroxine (SYNTHROID) 150 MCG tablet Take 150 mcg by mouth daily.     lidocaine (LIDODERM) 5 % Place 1 patch onto the skin every 12 (twelve) hours. Remove & Discard patch within 12 hours or as directed by MD 10 patch 0   LINZESS 145 MCG CAPS capsule Take 145 mcg by mouth daily.     meclizine (ANTIVERT) 25 MG tablet Take 25 mg by mouth 2 (two) times daily as needed.     meclizine (ANTIVERT) 25 MG tablet Take 1 tablet (25 mg total) by mouth 3 (three) times daily as needed for dizziness. 30 tablet 0   Melatonin 5 MG CAPS Take 2 capsules by mouth daily.     meloxicam (MOBIC) 15 MG tablet Take 15 mg by mouth daily.     methocarbamol (ROBAXIN) 500 MG tablet Take 1 tablet (500 mg total) by mouth every 8 (eight) hours as needed. 30 tablet 0   montelukast (SINGULAIR) 10 MG tablet Take 10 mg by mouth daily.  MOUNJARO 10 MG/0.5ML Pen Inject 10 mg into the skin once a week.     olopatadine (PATANOL) 0.1 %  ophthalmic solution Place 1 drop into both eyes 2 (two) times daily.     omeprazole (PRILOSEC) 40 MG capsule Take 40 mg by mouth daily.     OXYGEN Inhale into the lungs. With CPAP     potassium citrate (UROCIT-K) 10 MEQ (1080 MG) SR tablet TAKE ONE TABLET BY MOUTH EVERY MORNING 90 tablet 3   roflumilast (DALIRESP) 500 MCG TABS tablet Take 500 mcg by mouth daily.     silver sulfADIAZINE (SILVADENE) 1 % cream Apply 1 Application topically 2 (two) times daily. 50 g 3   torsemide (DEMADEX) 20 MG tablet TAKE ONE TABLET BY MOUTH DAILY 90 tablet 3   traZODone (DESYREL) 150 MG tablet Take 150 mg by mouth at bedtime.     triamcinolone cream (KENALOG) 0.1 % Apply twice daily to affected areas on arms and legs until smooth. Avoid applying to face, groin, and axilla. 454 g 2   No current facility-administered medications for this visit.    OBJECTIVE: Vitals:   06/04/23 1455  BP: (!) 117/59  Pulse: 86  Resp: 20  Temp: (!) 96.7 F (35.9 C)  SpO2: 95%     Body mass index is 40.39 kg/m.    ECOG FS:0 - Asymptomatic  General: Well-developed, well-nourished, no acute distress.  Sitting in a wheelchair. Eyes: Pink conjunctiva, anicteric sclera. HEENT: Normocephalic, moist mucous membranes. Lungs: No audible wheezing or coughing. Heart: Regular rate and rhythm. Abdomen: Soft, nontender, no obvious distention. Musculoskeletal: No edema, cyanosis, or clubbing. Neuro: Alert, answering all questions appropriately. Cranial nerves grossly intact. Skin: No rashes or petechiae noted. Psych: Normal affect.  LAB RESULTS:  Lab Results  Component Value Date   NA 135 04/26/2023   K 4.0 04/26/2023   CL 97 (L) 04/26/2023   CO2 28 04/26/2023   GLUCOSE 168 (H) 04/26/2023   BUN 18 04/26/2023   CREATININE 1.27 (H) 04/26/2023   CALCIUM 9.4 04/26/2023   PROT 7.4 06/25/2017   ALBUMIN 3.4 (L) 06/25/2017   AST 19 06/25/2017   ALT 11 (L) 06/25/2017   ALKPHOS 122 06/25/2017   BILITOT 0.6 06/25/2017   GFRNONAA  47 (L) 04/26/2023   GFRAA >60 09/29/2019    Lab Results  Component Value Date   WBC 8.6 04/26/2023   NEUTROABS 17.3 (H) 10/07/2019   HGB 14.1 04/26/2023   HCT 43.5 04/26/2023   MCV 83.5 04/26/2023   PLT 204 04/26/2023     STUDIES: No results found.  ASSESSMENT: DCIS, right breast.  PLAN:    DCIS, right breast: Pathology reviewed independently.  Patient underwent lumpectomy on May 09, 2022.  Given the noninvasive nature of her pathology, she did not require adjuvant chemotherapy.  She completed adjuvant XRT on July 05, 2022.  Because of interaction with Cymbalta, patient cannot take tamoxifen.  Continue letrozole for a total of 5 years completing treatment in April 2029.  Repeat mammogram in October 2025.  Patient would then return to clinic 2 to 3 days later to discuss the results and continuation of treatment.   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 Osteopenia: Bone mineral density on October 11, 2022 revealed a T-score of -1.9.  Continue calcium and vitamin D supplementation.  Repeat in October 2025 along with mammogram as above. Rotator cuff injury: Patient reports that she did not undergo surgery and only did 1 appointment with rehab.  Have recommended  patient call orthopedics and reinitiate physical therapy.  Patient expressed understanding and was in agreement with this plan. She also understands that She can call clinic at any time with any questions, concerns, or complaints.    Cancer Staging  Ductal carcinoma in situ (DCIS) of right breast Staging form: Breast, AJCC 8th Edition - Clinical stage from 05/25/2022: Stage 0 (cTis (DCIS), cN0, cM0, ER+, PR: Not Assessed, HER2: Not Assessed) - Signed by Jeralyn Ruths, MD on 05/25/2022 Stage prefix: Initial diagnosis Nuclear grade: G2   Jeralyn Ruths, MD   06/04/2023 3:23 PM

## 2023-06-05 DIAGNOSIS — D0511 Intraductal carcinoma in situ of right breast: Secondary | ICD-10-CM

## 2023-06-12 ENCOUNTER — Other Ambulatory Visit: Payer: Self-pay | Admitting: Family Medicine

## 2023-06-12 ENCOUNTER — Ambulatory Visit
Admission: RE | Admit: 2023-06-12 | Discharge: 2023-06-12 | Disposition: A | Discharge status: Home / Self Care | Source: Ambulatory Visit | Attending: Family Medicine | Admitting: Family Medicine

## 2023-06-12 DIAGNOSIS — K5900 Constipation, unspecified: Secondary | ICD-10-CM | POA: Insufficient documentation

## 2023-06-19 ENCOUNTER — Ambulatory Visit: Admission: RE | Admit: 2023-06-19 | Source: Ambulatory Visit

## 2023-06-26 ENCOUNTER — Ambulatory Visit: Payer: 59 | Attending: Cardiology | Admitting: Cardiology

## 2023-07-06 ENCOUNTER — Ambulatory Visit: Admitting: Cardiology

## 2023-07-31 ENCOUNTER — Ambulatory Visit: Admitting: Dermatology

## 2023-07-31 ENCOUNTER — Encounter: Payer: Self-pay | Admitting: Dermatology

## 2023-07-31 DIAGNOSIS — S81801A Unspecified open wound, right lower leg, initial encounter: Secondary | ICD-10-CM

## 2023-07-31 DIAGNOSIS — T148XXA Other injury of unspecified body region, initial encounter: Secondary | ICD-10-CM

## 2023-07-31 DIAGNOSIS — T1490XA Injury, unspecified, initial encounter: Secondary | ICD-10-CM

## 2023-07-31 NOTE — Patient Instructions (Signed)
 Mix 1/2 tablespoon white vinegar in 1 cup of water  and apply to the treated area with a clean wash cloth 3-4 times per day.    Due to recent changes in healthcare laws, you may see results of your pathology and/or laboratory studies on MyChart before the doctors have had a chance to review them. We understand that in some cases there may be results that are confusing or concerning to you. Please understand that not all results are received at the same time and often the doctors may need to interpret multiple results in order to provide you with the best plan of care or course of treatment. Therefore, we ask that you please give us  2 business days to thoroughly review all your results before contacting the office for clarification. Should we see a critical lab result, you will be contacted sooner.   If You Need Anything After Your Visit  If you have any questions or concerns for your doctor, please call our main line at [phone] and press option 4 to reach your doctor's medical assistant. If no one answers, please leave a voicemail as directed and we will return your call as soon as possible. Messages left after 4 pm will be answered the following business day.   You may also send us  a message via MyChart. We typically respond to MyChart messages within 1-2 business days.  For prescription refills, please ask your pharmacy to contact our office. Our fax number is [phone].  If you have an urgent issue when the clinic is closed that cannot wait until the next business day, you can page your doctor at the number below.    Please note that while we do our best to be available for urgent issues outside of office hours, we are not available 24/7.   If you have an urgent issue and are unable to reach us , you may choose to seek medical care at your doctor's office, retail clinic, urgent care center, or emergency room.  If you have a medical emergency, please immediately call 911 or go to the  emergency department.  Pager Numbers  - Dr. Bary Likes: [phone]  - Dr. Annette Barters: [phone]  - Dr. Felipe Horton: [phone]   In the event of inclement weather, please call our main line at [phone] for an update on the status of any delays or closures.  Dermatology Medication Tips: Please keep the boxes that topical medications come in in order to help keep track of the instructions about where and how to use these. Pharmacies typically print the medication instructions only on the boxes and not directly on the medication tubes.   If your medication is too expensive, please contact our office at [phone] option 4 or send us  a message through MyChart.   We are unable to tell what your co-pay for medications will be in advance as this is different depending on your insurance coverage. However, we may be able to find a substitute medication at lower cost or fill out paperwork to get insurance to cover a needed medication.   If a prior authorization is required to get your medication covered by your insurance company, please allow us  1-2 business days to complete this process.  Drug prices often vary depending on where the prescription is filled and some pharmacies may offer cheaper prices.  The website www.goodrx.com contains coupons for medications through different pharmacies. The prices here do not account for what the cost may be with help from insurance (it may be cheaper with  your insurance), but the website can give you the price if you did not use any insurance.  - You can print the associated coupon and take it with your prescription to the pharmacy.  - You may also stop by our office during regular business hours and pick up a GoodRx coupon card.  - If you need your prescription sent electronically to a different pharmacy, notify our office through [facility] or by phone at [phone] option 4.     Si Usted Necesita Algo Despus de Su Visita  Tambin puede  enviarnos un mensaje a travs de Clinical cytogeneticist. Por lo general respondemos a los mensajes de MyChart en el transcurso de 1 a 2 das hbiles.  Para renovar recetas, por favor pida a su farmacia que se ponga en contacto con nuestra oficina. Franz Jacks de fax es [place] [phone].  Si tiene un asunto urgente cuando la clnica est cerrada y que no puede esperar hasta el siguiente da hbil, puede llamar/localizar a su doctor(a) al nmero que aparece a continuacin.   Por favor, tenga en cuenta que aunque hacemos todo lo posible para estar disponibles para asuntos urgentes fuera del horario de [place], no estamos disponibles las 24 horas del da, los 7 [address]  [address] de la [place].   Si tiene un problema urgente y no puede comunicarse con nosotros, puede optar por buscar atencin mdica  en el consultorio de su doctor(a), en una clnica privada, en un centro de atencin urgente o en una sala de emergencias.  Si tiene Engineer, drilling, por favor llame inmediatamente al 911 o vaya a la sala de emergencias.  Nmeros de bper  - Dr. Bary Likes: [phone]  - Dra. Annette Barters: [phone]  - Dr. Felipe Horton: [phone]   En caso de inclemencias del tiempo, por favor llame a Lajuan Pila principal al [phone] para una actualizacin sobre el [place] de cualquier retraso o cierre.  Consejos para la medicacin en dermatologa: Por favor, guarde las cajas en las que vienen los medicamentos de uso tpico para ayudarle a seguir las instrucciones sobre dnde y cmo usarlos. Las farmacias generalmente imprimen las instrucciones del medicamento slo en las cajas y no directamente en los tubos del [place].   Si su medicamento es muy caro, por favor, pngase en contacto con Bettyjane Brunet llamando al [phone] y presione la opcin 4 o envenos un mensaje a travs de Clinical cytogeneticist.   No podemos decirle cul ser su copago por los medicamentos por adelantado ya que esto es diferente dependiendo de la cobertura de su  seguro. Sin embargo, es posible que podamos encontrar un medicamento sustituto a Audiological scientist un formulario para que el seguro cubra el medicamento que se considera necesario.   Si se requiere una autorizacin previa para que su compaa de seguros [place] su medicamento, por favor permtanos de 1 a 2 das hbiles para completar este proceso.  Los precios de los medicamentos varan con frecuencia dependiendo del Environmental consultant de dnde se surte la receta y alguna farmacias pueden ofrecer precios ms baratos.  El sitio web www.goodrx.com tiene cupones para medicamentos de Health and safety inspector. Los precios aqu no tienen en cuenta lo que podra costar con la ayuda del seguro (puede ser ms barato con su seguro), pero el sitio web puede darle el precio si no utiliz Tourist information centre manager.  - Puede imprimir el cupn correspondiente y llevarlo con su receta a la farmacia.  - Tambin puede pasar por nuestra oficina durante el horario de atencin regular y Librarian, academic  tarjeta de cupones de GoodRx.  - Si necesita que su receta se enve electrnicamente a una farmacia diferente, informe a nuestra oficina a travs de MyChart de [place] o por telfono llamando al [phone] y presione la opcin 4.

## 2023-07-31 NOTE — Progress Notes (Signed)
   Follow-Up Visit   Subjective  [name] [name] is a 68 y.o. female who presents for the following: Pt concerned about a possibly infected wound on the R lower leg that has been draining green and yellow pus for a few days. Pt unsure how the wound occurred, but noticed it about a week and a half ago, pt is diabetic. Patient cleaning daily, applying Neosporin, and covering with a bandage.   The following portions of the chart were reviewed this encounter and updated as appropriate: medications, allergies, medical history  Review of Systems:  No other skin or systemic complaints except as noted in HPI or Assessment and Plan.  Objective  Well appearing patient in no apparent distress; mood and affect are within normal limits.   A focused examination was performed of the following areas: the lower legs   Relevant exam findings are noted in the Assessment and Plan.    Assessment & Plan   Skin wound likely secondary to Traumatic injury  Exam: ulcerated plaque with ~80% covered by hemorrhagic crust of the R anterior lower leg  Treatment Plan: Neuropathy is likely reason why patient does not recall episode of trauma Start Mupirocin  2% ointment to aa BID and cover with bandage until healed. Mix 1/2 tablespoon white vinegar in 1 cup of water  and apply to the treated area with a clean wash cloth 3-4 times per day.  May take 2-3 months to heal fully Aerobic swab to check for infection, does not appear infected but patient reports purulence yesterday Stop neosporin given lack of efficacy and risk of allergy TRAUMATIC INJURY   Related Procedures Anaerobic and Aerobic Culture OPEN WOUND OF SKIN    Return for appointment as scheduled.  Arlinda Lais, CMA, am acting as scribe for Harris Liming, MD .  Documentation: I have reviewed the above documentation for accuracy and completeness, and I agree with the above.  Harris Liming, MD

## 2023-08-02 ENCOUNTER — Telehealth: Payer: Self-pay

## 2023-08-02 MED ORDER — MUPIROCIN 2 % EX OINT: 1.0000 | TOPICAL_OINTMENT | Freq: Two times a day (BID) | CUTANEOUS | 0 refills | Status: AC

## 2023-08-02 NOTE — Telephone Encounter (Signed)
 RX not sent in from office visit.

## 2023-08-05 ENCOUNTER — Ambulatory Visit: Payer: Self-pay | Admitting: Dermatology

## 2023-08-05 LAB — ANAEROBIC AND AEROBIC CULTURE

## 2023-08-06 NOTE — Telephone Encounter (Signed)
-----   Message from Maybee sent at 08/05/2023  4:08 PM EDT ----- Anaerobic Culture Final report Result 1 Comment Comment: No anaerobic growth in 72 hours. Aerobic Culture Final report Result 1 Comment  Plan: please call to share wound culture did not grow any harmful bacteria

## 2023-08-10 ENCOUNTER — Encounter: Payer: Self-pay | Admitting: Cardiology

## 2023-08-10 ENCOUNTER — Ambulatory Visit: Attending: Cardiology | Admitting: Cardiology

## 2023-08-10 VITALS — BP 117/58 | HR 95 | Ht 63.0 in | Wt 217.0 lb

## 2023-08-10 DIAGNOSIS — I5032 Chronic diastolic (congestive) heart failure: Secondary | ICD-10-CM

## 2023-08-10 DIAGNOSIS — Z6838 Body mass index (BMI) 38.0-38.9, adult: Secondary | ICD-10-CM | POA: Diagnosis not present

## 2023-08-10 DIAGNOSIS — I10 Essential (primary) hypertension: Secondary | ICD-10-CM

## 2023-08-10 NOTE — Progress Notes (Signed)
 Cardiology Office Note:    Date:  08/10/2023   ID:  [name] [name], DOB 04/20/1955, MRN [id number]  PCP:  Comer Decamp, MD  Cardiologist:  Constancia Delton, MD  Electrophysiologist:  None   Referring MD: Comer Decamp, MD   No chief complaint on file.   History of Present Illness:    [name] [name] is a 68 y.o. female with a hx of HFpEF, hypertension, former smoker x30+ years, COPD, diabetes, obesity, OSA on CPAP who presents for follow-up.  Doing okay, denies shortness of breath, had a mechanical fall after tripping about 7 months ago sustaining bruises in her arms.  Takes torsemide  daily, also takes Mounjaro.  Has lost close to 40 pounds over the past year.  Overall doing okay, no concerns today.  Prior notes Echo 03/2021 EF 60 to 65%, grade 2 diastolic dysfunction Myoview  05/2019 no evidence for ischemia.   Past Medical History:  Diagnosis Date   (HFpEF) heart failure with preserved ejection fraction (HCC)    a.) TTE 05/21/2016: EF 50%, mild LV dil, ant HK, mild RAE, G1DD; b.) TTE 06/06/2019: EF 60-65%, G2DD; c.) TTE 04/04/2021: EF 60-65%, mod LAE, mild RAE, mod MAC, mild MV stenosis, G2DD   Allergic rhinitis    Allergy    Aortic atherosclerosis (HCC)    Asthma    CHF (congestive heart failure) (HCC)    Chronic back pain    COPD (chronic obstructive pulmonary disease) (HCC)    Degenerative joint disease of knee, left    Depression    Diverticulosis    Dyspnea    Edema    GERD (gastroesophageal reflux disease)    Headache    Hidradenitis    History of bilateral cataract extraction    History of colonic polyps    HLD (hyperlipidemia)    Hypertension    Hypothyroidism    IBS (irritable bowel syndrome)    IDA (iron  deficiency anemia)    Insomnia    a.) takes trazodone  PRN   Intraductal papilloma of breast, right    Mitral valve stenosis 04/04/2021   a.) TTE 04/04/2021: mild MS (MPG 5.0 mm Hg).   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    Sinusitis, chronic    T2DM (type 2 diabetes mellitus) (HCC)    Vaginitis, atrophic    Vertigo    Vitamin D deficiency     Past Surgical History:  Procedure Laterality Date   ABDOMINAL HYSTERECTOMY     AXILLARY HIDRADENITIS EXCISION     BREAST BIOPSY Right 02/22/2022   stereo bx, calcs, "X" clip- ATYPICAL INTRADUCTAL PAPILLARY PROLIFERATION WITH SCLEROSIS AND CALCIFICATION PROLIFERATION WITH SCLEROSIS AND CALCIFICATION   BREAST BIOPSY Right 02/22/2022   MM RT BREAST BX W LOC DEV 1ST LESION IMAGE BX SPEC STEREO GUIDE 02/22/2022 [facility]   BREAST BIOPSY Right 04/04/2022   rt br calcs coil clip, PASH   BREAST BIOPSY Right 04/04/2022   MM RT BREAST BX W LOC DEV 1ST LESION IMAGE BX SPEC STEREO GUIDE 04/04/2022 [facility]   BREAST BIOPSY Right 04/26/2022   MM RT RADIO FREQUENCY TAG EA ADD LESION LOC MAMMO GUIDE 04/26/2022 [facility]   BREAST BIOPSY Right 04/26/2022   MM RT RADIO FREQUENCY TAG  LOC MAMMO GUIDE 04/26/2022 [facility]   BREAST EXCISIONAL BIOPSY Bilateral 12/2015   RUPTURED FOLLICULAR CYSTS WITH ABSCESSES AND SCARRING, CONSISTENT WITH HIDRADENITIS SUPPURATIVA.    BREAST LUMPECTOMY WITH RADIOFREQUENCY TAG IDENTIFICATION Right 05/09/2022   Procedure: BREAST LUMPECTOMY WITH RADIOFREQUENCY TAG IDENTIFICATION;  Surgeon: Emmalene Hare, MD;  Location: [facility];  Service: General;  Laterality: Right;   CATARACT EXTRACTION W/PHACO Right 07/27/2020   Procedure: CATARACT EXTRACTION PHACO AND INTRAOCULAR LENS PLACEMENT (IOC) RIGHT DIABETIC 17.13 01:23.9;  Surgeon: Clair Crews, MD;  Location: [facility];  Service: Ophthalmology;  Laterality: Right   CATARACT EXTRACTION W/PHACO Left 04/11/2022   Procedure: CATARACT EXTRACTION PHACO AND INTRAOCULAR LENS  PLACEMENT (IOC) LEFT DIABETIC 15.05 01:18.6;  Surgeon: Clair Crews, MD;  Location: [facility];  Service: Ophthalmology;  Laterality: Left   CESAREAN SECTION     x 2   CHOLECYSTECTOMY     COLONOSCOPY WITH PROPOFOL  N/A 09/15/2021   Procedure: COLONOSCOPY WITH PROPOFOL ;  Surgeon: Selena Daily, MD;  Location: [facility];  Service: Gastroenterology;  Laterality: N/A;   HEEL SPUR EXCISION N/A    HYDRADENITIS EXCISION Right 12/31/2015   Procedure: EXCISION HIDRADENITIS AXILLA;  Surgeon: Gwyndolyn Lerner, MD;  Location: [facility];  Service: General;  Laterality: Right;   KNEE SURGERY Left    1998   TONSILLECTOMY      Current Medications: Current Meds  Medication Sig   acetaminophen  (TYLENOL ) 500 MG tablet Take 500 mg by mouth every 6 (six) hours as needed.   albuterol  (PROVENTIL  HFA;VENTOLIN  HFA) 108 (90 Base) MCG/ACT inhaler Inhale 2 puffs into the lungs every 6 (six) hours as needed for wheezing or shortness of breath.   aspirin  EC 81 MG tablet Take 81 mg by mouth daily.   atorvastatin  (LIPITOR) 20 MG tablet Take 20 mg by mouth at bedtime.   Budeson-Glycopyrrol-Formoterol  (BREZTRI  AEROSPHERE) 160-9-4.8 MCG/ACT AERO Inhale 2 puffs into the lungs in the morning and at bedtime.   clobetasol  (TEMOVATE ) 0.05 % external solution Apply twice daily to scalp as needed for itching.   Colchicine 0.6 MG CAPS Take by mouth.   cyclobenzaprine (FLEXERIL) 10 MG tablet Take 10 mg by mouth at bedtime as needed.   doxepin  (SINEQUAN ) 50 MG capsule Take 50 mg by mouth at bedtime.   DULoxetine  (CYMBALTA ) 60 MG capsule Take 60 mg by mouth daily.   Fluocinolone  Acetonide Body 0.01 % OIL Apply to scalp BID as needed until irritation resolves   fluticasone  (FLONASE ) 50 MCG/ACT nasal spray as needed.   gabapentin  (NEURONTIN ) 600 MG tablet Take 600 mg by mouth. 1 tablet in the am, 1 tablet in the pm and 2 tablets at night  Pt reports taking 1 tablet at night   glucose blood test strip TEST  three times a day   HUMULIN  R U-500 KWIKPEN 500 UNIT/ML KwikPen Inject 3 mLs into the skin. 75 units in the am and 65 units in the evening   hydroxychloroquine (PLAQUENIL) 200 MG tablet Take 1 tablet by mouth 2 (two) times daily.   ipratropium (ATROVENT HFA) 17 MCG/ACT inhaler Inhale 2 puffs into the lungs every 6 (six) hours as needed for wheezing. Patient uses once a day   JARDIANCE 25 MG TABS tablet Take 25 mg by mouth daily.   ketoconazole  (NIZORAL ) 2 % shampoo SHAMPOO INTO SCALP AND LET SIT FOR 10 MINUTES AND THEN WASH OUT; USE 3 DAYS PER WEEK AS DIRECTED   Lancets Misc. (ACCU-CHEK FASTCLIX LANCET) KIT    letrozole  (FEMARA ) 2.5 MG  tablet TAKE ONE TABLET BY MOUTH EVERY DAY   levocetirizine (XYZAL ) 5 MG tablet TAKE 1 TABLET BY MOUTH EVERY EVENING   levothyroxine  (SYNTHROID ) 150 MCG tablet Take 150 mcg by mouth daily.   lidocaine  (LIDODERM ) 5 % Place 1 patch onto the skin every 12 (twelve) hours. Remove & Discard patch within 12 hours or as directed by MD   LINZESS 145 MCG CAPS capsule Take 145 mcg by mouth daily.   meclizine  (ANTIVERT ) 25 MG tablet Take 25 mg by mouth 2 (two) times daily as needed.   meclizine  (ANTIVERT ) 25 MG tablet Take 1 tablet (25 mg total) by mouth 3 (three) times daily as needed for dizziness.   Melatonin 5 MG CAPS Take 2 capsules by mouth daily.   meloxicam  (MOBIC ) 15 MG tablet Take 15 mg by mouth daily.   methocarbamol  (ROBAXIN ) 500 MG tablet Take 1 tablet (500 mg total) by mouth every 8 (eight) hours as needed.   montelukast  (SINGULAIR ) 10 MG tablet Take 10 mg by mouth daily.   MOUNJARO 10 MG/0.5ML Pen Inject 10 mg into the skin once a week.   mupirocin  ointment (BACTROBAN ) 2 % Apply 1 Application topically 2 (two) times daily. Start Mupirocin  2% ointment to aa BID and cover with bandage until healed.   olopatadine  (PATANOL) 0.1 % ophthalmic solution Place 1 drop into both eyes 2 (two) times daily.   omeprazole (PRILOSEC) 40 MG capsule Take 40 mg by mouth daily.    OXYGEN Inhale into the lungs. With CPAP   potassium citrate  (UROCIT-K ) 10 MEQ (1080 MG) SR tablet TAKE ONE TABLET BY MOUTH EVERY MORNING   roflumilast (DALIRESP) 500 MCG TABS tablet Take 500 mcg by mouth daily.   silver  sulfADIAZINE  (SILVADENE ) 1 % cream Apply 1 Application topically 2 (two) times daily.   torsemide  (DEMADEX ) 20 MG tablet TAKE ONE TABLET BY MOUTH DAILY   traZODone  (DESYREL ) 150 MG tablet Take 150 mg by mouth at bedtime.   triamcinolone  cream (KENALOG ) 0.1 % Apply twice daily to affected areas on arms and legs until smooth. Avoid applying to face, groin, and axilla.     Allergies:   Codeine and Levofloxacin    Social History   Socioeconomic History   Marital status: Divorced    Spouse name: Not on file   Number of children: Not on file   Years of education: Not on file   Highest education level: Not on file  Occupational History   Occupation: disabled  Tobacco Use   Smoking status: Former    Current packs/day: 0.00    Average packs/day: 3.0 packs/day for 20.0 years (60.0 ttl pk-yrs)    Types: Cigarettes    Start date: 12/18/1973    Quit date: 12/18/1993    Years since quitting: 29.6    Passive exposure: Past   Smokeless tobacco: Never  Vaping Use   Vaping status: Never Used  Substance and Sexual Activity   Alcohol use: No   Drug use: No    Types: Marijuana   Sexual activity: Never    Birth control/protection: Abstinence  Other Topics Concern   Not on file  Social History Narrative   Lives alone   Social Drivers of Health   Financial Resource Strain: Low Risk  (03/09/2017)   Overall Financial Resource Strain (CARDIA)    Difficulty of Paying Living Expenses: Not hard at all  Food Insecurity: Unknown (03/09/2017)   Hunger Vital Sign    Worried About Running Out of Food in the Last Year: Patient  declined    Barista in the Last Year: Patient declined  Transportation Needs: Unknown (03/09/2017)   PRAPARE - Administrator, Civil Service  (Medical): Patient declined    Lack of Transportation (Non-Medical): Patient declined  Physical Activity: Unknown (03/09/2017)   Exercise Vital Sign    Days of Exercise per Week: Patient declined    Minutes of Exercise per Session: Patient declined  Stress: No Stress Concern Present (03/09/2017)   [employer] of Occupational Health - Occupational Stress Questionnaire    Feeling of Stress : Not at all  Social Connections: Somewhat Isolated (03/09/2017)   Social Connection and Isolation Panel [NHANES]    Frequency of Communication with Friends and Family: More than three times a week    Frequency of Social Gatherings with Friends and Family: Once a week    Attends Religious Services: More than 4 times per year    Active Member of [employer] or Organizations: No    Attends Engineer, structural: Never    Marital Status: Divorced     Family History: The patient's family history includes Breast cancer in her paternal grandmother; COPD in her mother; Heart disease in her father; Hypertension in her father; Rashes / Skin problems in her father. There is no history of Kidney cancer or Bladder Cancer.  ROS:   Please see the history of present illness.     All other systems reviewed and are negative.  EKGs/Labs/Other Studies Reviewed:    The following studies were reviewed today:   EKG Interpretation Date/Time:  Friday Aug 10 2023 16:26:42 EDT Ventricular Rate:  95 PR Interval:  204 QRS Duration:  134 QT Interval:  408 QTC Calculation: 512 R Axis:   -63  Text Interpretation: Sinus rhythm with occasional and consecutive Premature ventricular complexes Right bundle branch block Left anterior fascicular block Confirmed by Constancia Delton ([id number]) on 08/10/2023 4:32:13 PM    Recent Labs: 04/26/2023: BUN 18; Creatinine, Ser 1.27; Hemoglobin 14.1; Platelets 204; Potassium 4.0; Sodium 135  Recent Lipid Panel    Component Value Date/Time   CHOL 107 09/18/2012 0622   TRIG 282  (H) 09/18/2012 0622   HDL 29 (L) 09/18/2012 0622   VLDL 56 (H) 09/18/2012 0622   LDLCALC 22 09/18/2012 0622    Physical Exam:    VS:  BP (!) 117/58 (BP Location: Left Arm, Patient Position: Sitting)   Pulse 95   Ht 5\' 3"  (1.6 m)   Wt 217 lb (98.4 kg)   SpO2 96%   BMI 38.44 kg/m     Wt Readings from Last 3 Encounters:  08/10/23 217 lb (98.4 kg)  06/04/23 228 lb (103.4 kg)  05/22/23 228 lb 12.8 oz (103.8 kg)     GEN:  Well nourished, well developed in no acute distress, obese HEENT: Normal NECK: No JVD; No carotid bruits CARDIAC: RRR, no murmurs, rubs, gallops RESPIRATORY:  Clear to auscultation without rales, wheezing or rhonchi  ABDOMEN: Soft, non-tender, distended MUSCULOSKELETAL: no edema; No deformity  SKIN: Warm and dry NEUROLOGIC:  Alert and oriented x 3 PSYCHIATRIC:  Normal affect   ASSESSMENT:    1. Chronic diastolic heart failure (HCC)   2. BMI 38.0-38.9,adult   3. Primary hypertension    PLAN:     HFpEF, appears euvolemic, continue torsemide  20 mg daily. obesity, low-calorie diet, weight loss advised.  Continue Mounjaro. hypertension, controlled off BP meds.  Diastolic slightly low.  Continue torsemide  20 mg daily  Follow-up in 12  months.   Medication Adjustments/Labs and Tests Ordered: Current medicines are reviewed at length with the patient today.  Concerns regarding medicines are outlined above.  Orders Placed This Encounter  Procedures   EKG 12-Lead   No orders of the defined types were placed in this encounter.   Patient Instructions  Medication Instructions:  Your physician recommends that you continue on your current medications as directed. Please refer to the Current Medication list given to you today.   *If you need a refill on your cardiac medications before your next appointment, please call your pharmacy*  Lab Work: No labs ordered today   Testing/Procedures: No test ordered today   Follow-Up: At [facility], you  and your health needs are our priority.  As part of our continuing mission to provide you with exceptional heart care, our providers are all part of one team.  This team includes your primary Cardiologist (physician) and Advanced Practice Providers or APPs (Physician Assistants and Nurse Practitioners) who all work together to provide you with the care you need, when you need it.  Your next appointment:   1 year(s)  Provider:   You may see Constancia Delton, MD or one of the following Advanced Practice Providers on your designated Care Team:   Laneta Pintos, NP Gildardo Labrador, PA-C Varney Gentleman, PA-C Cadence Gennaro Khat, PA-C Ronald Cockayne, NP Morey Ar, NP          Signed, Constancia Delton, MD  08/10/2023 4:47 PM    [facility]

## 2023-08-10 NOTE — Patient Instructions (Signed)
 Medication Instructions:  Your physician recommends that you continue on your current medications as directed. Please refer to the Current Medication list given to you today.   *If you need a refill on your cardiac medications before your next appointment, please call your pharmacy*  Lab Work: No labs ordered today   Testing/Procedures: No test ordered today   Follow-Up: At [facility], you and your health needs are our priority.  As part of our continuing mission to provide you with exceptional heart care, our providers are all part of one team.  This team includes your primary Cardiologist (physician) and Advanced Practice Providers or APPs (Physician Assistants and Nurse Practitioners) who all work together to provide you with the care you need, when you need it.  Your next appointment:   1 year(s)  Provider:   You may see Constancia Delton, MD or one of the following Advanced Practice Providers on your designated Care Team:   Laneta Pintos, NP Gildardo Labrador, PA-C Varney Gentleman, PA-C Cadence [place], PA-C Ronald Cockayne, NP Morey Ar, NP

## 2023-08-14 ENCOUNTER — Ambulatory Visit: Payer: 59 | Admitting: Internal Medicine

## 2023-08-15 ENCOUNTER — Other Ambulatory Visit: Payer: Self-pay | Admitting: Oncology

## 2023-08-15 DIAGNOSIS — Z853 Personal history of malignant neoplasm of breast: Secondary | ICD-10-CM

## 2023-08-20 NOTE — Telephone Encounter (Signed)
-----   Message from Maybee sent at 08/05/2023  4:08 PM EDT ----- Anaerobic Culture Final report Result 1 Comment Comment: No anaerobic growth in 72 hours. Aerobic Culture Final report Result 1 Comment  Plan: please call to share wound culture did not grow any harmful bacteria

## 2023-08-20 NOTE — Telephone Encounter (Signed)
 Patient advised culture did not grow any harmful bacteria. Sue Em., RMA

## 2023-08-21 ENCOUNTER — Telehealth: Payer: Self-pay

## 2023-08-21 NOTE — Telephone Encounter (Signed)
 Patient LVM asking for Xyzal  refills. Sue Em., RMA

## 2023-08-22 ENCOUNTER — Other Ambulatory Visit: Payer: Self-pay | Admitting: Dermatology

## 2023-08-22 DIAGNOSIS — L299 Pruritus, unspecified: Secondary | ICD-10-CM

## 2023-08-22 MED ORDER — LEVOCETIRIZINE DIHYDROCHLORIDE 5 MG PO TABS
5.0000 mg | ORAL_TABLET | Freq: Every evening | ORAL | 11 refills | Status: AC
Start: 2023-08-22 — End: ?

## 2023-09-10 ENCOUNTER — Telehealth: Payer: Self-pay | Admitting: *Deleted

## 2023-09-10 ENCOUNTER — Telehealth: Payer: Self-pay

## 2023-09-10 NOTE — Telephone Encounter (Signed)
 Dr. Darliss,  [name] [name] is requesting preoperative cardiac evaluation for right fifth metatarsal exostectomy and hammertoe repair.  She was recently seen in clinic by you on 08/10/2023.  She was stable from a cardiac standpoint at that time.  Would you be able to comment on cardiac risk for upcoming surgery?  Thank you for your help.  Please direct your response to [facility] preop pool.  [name] [name]. Ashwin Tibbs NP-C     09/10/2023, 4:11 PM [facility] [address] Office [phone] Fax [phone]

## 2023-09-10 NOTE — Telephone Encounter (Signed)
   Pre-operative Risk Assessment    Patient Name: [name] [name]  DOB: 1955-12-27 MRN: [id number]   Date of last office visit: 07/2023 Date of next office visit: 11/2023 [facility]   Request for Surgical Clearance    Procedure:  RIGHT FIFTH METATORSAL EXOSTECTOMY AND FIFTH HAMMERTOE REPAIR  Date of Surgery:  Clearance TBD                                Surgeon:  DR. LENNIE Socks Group or Practice Name:  [facility] Phone number:  [phone] Fax number:  [phone]   Type of Clearance Requested:   - Medical  - Pharmacy:  Hold Aspirin  NOT INDICATED   Type of Anesthesia:  Not Indicated   Additional requests/questions:    [facility]   09/10/2023, 2:14 PM

## 2023-09-10 NOTE — Telephone Encounter (Signed)
 Patient called stating she found a new painful lump in breast. She doesn't see Dr. Jacobo until Oct. Requested to come in tomorrow for evaluation. She has appointment at 3p elsewhere but if she could come in at 1130 or 1p [facility]. If someone can please call her back to confirm.

## 2023-09-10 NOTE — Telephone Encounter (Signed)
 Patient called back aware pf apt scheduled tomorrow at 1p

## 2023-09-11 ENCOUNTER — Inpatient Hospital Stay: Attending: Nurse Practitioner | Admitting: Nurse Practitioner

## 2023-09-11 ENCOUNTER — Encounter: Payer: Self-pay | Admitting: Nurse Practitioner

## 2023-09-11 VITALS — BP 124/63 | HR 86 | Temp 97.4°F | Resp 16 | Wt 218.0 lb

## 2023-09-11 DIAGNOSIS — N644 Mastodynia: Secondary | ICD-10-CM | POA: Diagnosis not present

## 2023-09-11 DIAGNOSIS — Z87891 Personal history of nicotine dependence: Secondary | ICD-10-CM | POA: Diagnosis not present

## 2023-09-11 DIAGNOSIS — D0511 Intraductal carcinoma in situ of right breast: Secondary | ICD-10-CM | POA: Insufficient documentation

## 2023-09-11 DIAGNOSIS — Z79811 Long term (current) use of aromatase inhibitors: Secondary | ICD-10-CM | POA: Insufficient documentation

## 2023-09-11 NOTE — Progress Notes (Signed)
 Symptom Management Clinic  [facility] A Department of the [place]. [facility] [address] [place], [place] [address] [phone] (phone) [phone] (fax)  Patient Care Team: Tobie Domino, MD as PCP - General (Family Medicine) Darliss Rogue, MD as PCP - Cardiology (Cardiology) Donette Ellouise LABOR, FNP as Nurse Practitioner (Family Medicine) Theotis Lavelle BRAVO, MD as Consulting Physician (Pulmonary Disease) Bosie Vinie LABOR, MD as Consulting Physician (Cardiology) Georgina Shasta POUR, RN as Oncology Nurse Navigator Lenn Aran, MD as Consulting Physician (Radiation Oncology) Jacobo Evalene PARAS, MD as Consulting Physician (Oncology) Desiderio Schanz, MD as Consulting Physician (General Surgery)   Name of the patient: [name] [name]  [id number]  10-05-1955   Date of visit: 09/11/23  Diagnosis- DCIS  Chief complaint/ Reason for visit- Right breast pain  Heme/Onc history:  Oncology History  Ductal carcinoma in situ (DCIS) of right breast  05/25/2022 Initial Diagnosis   Ductal carcinoma in situ (DCIS) of right breast   05/25/2022 Cancer Staging   Staging form: Breast, AJCC 8th Edition - Clinical stage from 05/25/2022: Stage 0 (cTis (DCIS), cN0, cM0, ER+, PR: Not Assessed, HER2: Not Assessed) - Signed by Jacobo Evalene PARAS, MD on 05/25/2022 Stage prefix: Initial diagnosis Nuclear grade: G2     Interval history- [name] [name] is a 68 y.o. female with above history of right breast DCIS, on AI, who returns to clinic for complaints of right breast pain. Symptoms started a week ago. She was lying in bed and felt pain below right breast. Tender to touch. Pain has been gradually improving. She says she felt a lump.   Review of systems- Review of Systems  Constitutional:  Negative for fever and malaise/fatigue.  Respiratory:  Negative for shortness of breath.   Cardiovascular:  Negative for chest pain and palpitations.   Neurological:  Negative for headaches.    Allergies  Allergen Reactions   Codeine Itching   Levofloxacin  Rash    welts & itching   Past Medical History:  Diagnosis Date   (HFpEF) heart failure with preserved ejection fraction (HCC)    a.) TTE 05/21/2016: EF 50%, mild LV dil, ant HK, mild RAE, G1DD; b.) TTE 06/06/2019: EF 60-65%, G2DD; c.) TTE 04/04/2021: EF 60-65%, mod LAE, mild RAE, mod MAC, mild MV stenosis, G2DD   Allergic rhinitis    Allergy    Aortic atherosclerosis (HCC)    Asthma    CHF (congestive heart failure) (HCC)    Chronic back pain    COPD (chronic obstructive pulmonary disease) (HCC)    Degenerative joint disease of knee, left    Depression    Diverticulosis    Dyspnea    Edema    GERD (gastroesophageal reflux disease)    Headache    Hidradenitis    History of bilateral cataract extraction    History of colonic polyps    HLD (hyperlipidemia)    Hypertension    Hypothyroidism    IBS (irritable bowel syndrome)    IDA (iron  deficiency anemia)    Insomnia    a.) takes trazodone  PRN   Intraductal papilloma of breast, right    Mitral valve stenosis 04/04/2021   a.) TTE 04/04/2021: mild MS (MPG 5.0 mm Hg).   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    Sinusitis, chronic    T2DM (type 2 diabetes mellitus) (HCC)    Vaginitis, atrophic    Vertigo    Vitamin D deficiency    Past Surgical History:  Procedure Laterality Date   ABDOMINAL HYSTERECTOMY     AXILLARY HIDRADENITIS EXCISION     BREAST BIOPSY Right 02/22/2022   stereo bx, calcs, X clip- ATYPICAL INTRADUCTAL PAPILLARY PROLIFERATION WITH SCLEROSIS AND CALCIFICATION PROLIFERATION WITH SCLEROSIS AND CALCIFICATION   BREAST BIOPSY Right 02/22/2022   MM RT BREAST BX W LOC  DEV 1ST LESION IMAGE BX SPEC STEREO GUIDE 02/22/2022 [facility]   BREAST BIOPSY Right 04/04/2022   rt br calcs coil clip, PASH   BREAST BIOPSY Right 04/04/2022   MM RT BREAST BX W LOC DEV 1ST LESION IMAGE BX SPEC STEREO GUIDE 04/04/2022 [facility]   BREAST BIOPSY Right 04/26/2022   MM RT RADIO FREQUENCY TAG EA ADD LESION LOC MAMMO GUIDE 04/26/2022 [facility]   BREAST BIOPSY Right 04/26/2022   MM RT RADIO FREQUENCY TAG LOC MAMMO GUIDE 04/26/2022 [facility]   BREAST EXCISIONAL BIOPSY Bilateral 12/2015   RUPTURED FOLLICULAR CYSTS WITH ABSCESSES AND SCARRING, CONSISTENT WITH HIDRADENITIS SUPPURATIVA.    BREAST LUMPECTOMY WITH RADIOFREQUENCY TAG IDENTIFICATION Right 05/09/2022   Procedure: BREAST LUMPECTOMY WITH RADIOFREQUENCY TAG IDENTIFICATION;  Surgeon: Desiderio Schanz, MD;  Location: [facility];  Service: General;  Laterality: Right;   CATARACT EXTRACTION W/PHACO Right 07/27/2020   Procedure: CATARACT EXTRACTION PHACO AND INTRAOCULAR LENS PLACEMENT (IOC) RIGHT DIABETIC 17.13 01:23.9;  Surgeon: Jaye Fallow, MD;  Location: [facility];  Service: Ophthalmology;  Laterality: Right   CATARACT EXTRACTION W/PHACO Left 04/11/2022   Procedure: CATARACT EXTRACTION PHACO AND INTRAOCULAR LENS PLACEMENT (IOC) LEFT DIABETIC 15.05 01:18.6;  Surgeon: Jaye Fallow, MD;  Location: [facility];  Service: Ophthalmology;  Laterality: Left   CESAREAN SECTION     x 2   CHOLECYSTECTOMY     COLONOSCOPY WITH PROPOFOL  N/A 09/15/2021   Procedure: COLONOSCOPY WITH PROPOFOL ;  Surgeon: Unk Corinn Skiff, MD;  Location: [facility];  Service: Gastroenterology;  Laterality: N/A;   HEEL SPUR EXCISION N/A    HYDRADENITIS EXCISION Right 12/31/2015   Procedure: EXCISION HIDRADENITIS AXILLA;  Surgeon: Carlin Pastel, MD;  Location: [facility];  Service: General;  Laterality: Right;   KNEE SURGERY Left    1998   TONSILLECTOMY     Social History   Socioeconomic History   Marital  status: Divorced    Spouse name: Not on file   Number of children: Not on file   Years of education: Not on file   Highest education level: Not on file  Occupational History   Occupation: disabled  Tobacco Use   Smoking status: Former    Current packs/day: 0.00    Average packs/day: 3.0 packs/day for 20.0 years (60.0 ttl pk-yrs)    Types: Cigarettes    Start date: 12/18/1973    Quit date: 12/18/1993    Years since quitting: 29.7    Passive exposure: Past   Smokeless tobacco: Never  Vaping Use   Vaping status: Never Used  Substance and Sexual Activity   Alcohol use: No   Drug use: No    Types: Marijuana   Sexual activity: Never    Birth control/protection: Abstinence  Other Topics Concern   Not on file  Social History Narrative   Lives alone   Social Drivers of Corporate investment banker  Strain: Low Risk  (03/09/2017)   Overall Financial Resource Strain (CARDIA)    Difficulty of Paying Living Expenses: Not hard at all  Food Insecurity: Unknown (03/09/2017)   Hunger Vital Sign    Worried About Running Out of Food in the Last Year: Patient declined    Ran Out of Food in the Last Year: Patient declined  Transportation Needs: Unknown (03/09/2017)   PRAPARE - Administrator, Civil Service (Medical): Patient declined    Lack of Transportation (Non-Medical): Patient declined  Physical Activity: Unknown (03/09/2017)   Exercise Vital Sign    Days of Exercise per Week: Patient declined    Minutes of Exercise per Session: Patient declined  Stress: No Stress Concern Present (03/09/2017)   [employer] of Occupational Health - Occupational Stress Questionnaire    Feeling of Stress : Not at all  Social Connections: Somewhat Isolated (03/09/2017)   Social Connection and Isolation Panel    Frequency of Communication with Friends and Family: More than three times a week    Frequency of Social Gatherings with Friends and Family: Once a week    Attends Religious  Services: More than 4 times per year    Active Member of [employer] or Organizations: No    Attends Banker Meetings: Never    Marital Status: Divorced  Catering manager Violence: Unknown (03/09/2017)   Humiliation, Afraid, Rape, and Kick questionnaire    Fear of Current or Ex-Partner: Patient declined    Emotionally Abused: Patient declined    Physically Abused: Patient declined    Sexually Abused: Patient declined   Family History  Problem Relation Age of Onset   Rashes / Skin problems Father    Hypertension Father    Heart disease Father    Breast cancer Paternal Grandmother    COPD Mother    Kidney cancer Neg Hx    Bladder Cancer Neg Hx    Current Outpatient Medications:    acetaminophen  (TYLENOL ) 500 MG tablet, Take 500 mg by mouth every 6 (six) hours as needed., Disp: , Rfl:    albuterol  (PROVENTIL  HFA;VENTOLIN  HFA) 108 (90 Base) MCG/ACT inhaler, Inhale 2 puffs into the lungs every 6 (six) hours as needed for wheezing or shortness of breath., Disp: , Rfl:    aspirin  EC 81 MG tablet, Take 81 mg by mouth daily., Disp: , Rfl:    atorvastatin  (LIPITOR) 20 MG tablet, Take 20 mg by mouth at bedtime., Disp: , Rfl:    Budeson-Glycopyrrol-Formoterol  (BREZTRI  AEROSPHERE) 160-9-4.8 MCG/ACT AERO, Inhale 2 puffs into the lungs in the morning and at bedtime., Disp: 10.7 g, Rfl: 11   clobetasol  (TEMOVATE ) 0.05 % external solution, Apply twice daily to scalp as needed for itching., Disp: 50 mL, Rfl: 3   Colchicine 0.6 MG CAPS, Take by mouth., Disp: , Rfl:    cyclobenzaprine (FLEXERIL) 10 MG tablet, Take 10 mg by mouth at bedtime as needed., Disp: , Rfl:    doxepin  (SINEQUAN ) 50 MG capsule, Take 50 mg by mouth at bedtime., Disp: , Rfl:    DULoxetine  (CYMBALTA ) 60 MG capsule, Take 60 mg by mouth daily., Disp: , Rfl:    Fluocinolone  Acetonide Body 0.01 % OIL, Apply to scalp BID as needed until irritation resolves, Disp: 120 mL, Rfl: 5   fluticasone  (FLONASE ) 50 MCG/ACT nasal spray, as  needed., Disp: , Rfl:    gabapentin  (NEURONTIN ) 600 MG tablet, Take 600 mg by mouth. 1 tablet in the am, 1 tablet in the pm and  2 tablets at night  Pt reports taking 1 tablet at night, Disp: , Rfl:    glucose blood test strip, TEST three times a day, Disp: , Rfl:    HUMULIN  R U-500 KWIKPEN 500 UNIT/ML KwikPen, Inject 3 mLs into the skin. 75 units in the am and 65 units in the evening, Disp: , Rfl:    hydroxychloroquine (PLAQUENIL) 200 MG tablet, Take 1 tablet by mouth 2 (two) times daily., Disp: , Rfl:    ipratropium (ATROVENT HFA) 17 MCG/ACT inhaler, Inhale 2 puffs into the lungs every 6 (six) hours as needed for wheezing. Patient uses once a day, Disp: , Rfl:    JARDIANCE 25 MG TABS tablet, Take 25 mg by mouth daily., Disp: , Rfl:    ketoconazole  (NIZORAL ) 2 % shampoo, SHAMPOO INTO SCALP AND LET SIT FOR 10 MINUTES AND THEN WASH OUT; USE 3 DAYS PER WEEK AS DIRECTED, Disp: 120 mL, Rfl: 11   Lancets Misc. (ACCU-CHEK FASTCLIX LANCET) KIT, , Disp: , Rfl:    letrozole  (FEMARA ) 2.5 MG tablet, TAKE ONE TABLET BY MOUTH EVERY DAY, Disp: 90 tablet, Rfl: 3   levocetirizine (XYZAL ) 5 MG tablet, Take 1 tablet (5 mg total) by mouth every evening., Disp: 30 tablet, Rfl: 11   levothyroxine  (SYNTHROID ) 150 MCG tablet, Take 150 mcg by mouth daily., Disp: , Rfl:    lidocaine  (LIDODERM ) 5 %, Place 1 patch onto the skin every 12 (twelve) hours. Remove & Discard patch within 12 hours or as directed by MD, Disp: 10 patch, Rfl: 0   LINZESS 145 MCG CAPS capsule, Take 145 mcg by mouth daily., Disp: , Rfl:    meclizine  (ANTIVERT ) 25 MG tablet, Take 25 mg by mouth 2 (two) times daily as needed., Disp: , Rfl:    meclizine  (ANTIVERT ) 25 MG tablet, Take 1 tablet (25 mg total) by mouth 3 (three) times daily as needed for dizziness., Disp: 30 tablet, Rfl: 0   Melatonin 5 MG CAPS, Take 2 capsules by mouth daily., Disp: , Rfl:    meloxicam  (MOBIC ) 15 MG tablet, Take 15 mg by mouth daily., Disp: , Rfl:    methocarbamol  (ROBAXIN ) 500  MG tablet, Take 1 tablet (500 mg total) by mouth every 8 (eight) hours as needed., Disp: 30 tablet, Rfl: 0   montelukast  (SINGULAIR ) 10 MG tablet, Take 10 mg by mouth daily., Disp: , Rfl:    MOUNJARO 10 MG/0.5ML Pen, Inject 10 mg into the skin once a week., Disp: , Rfl:    mupirocin  ointment (BACTROBAN ) 2 %, Apply 1 Application topically 2 (two) times daily. Start Mupirocin  2% ointment to aa BID and cover with bandage until healed., Disp: 22 g, Rfl: 0   olopatadine  (PATANOL) 0.1 % ophthalmic solution, Place 1 drop into both eyes 2 (two) times daily., Disp: , Rfl:    omeprazole (PRILOSEC) 40 MG capsule, Take 40 mg by mouth daily., Disp: , Rfl:    OXYGEN, Inhale into the lungs. With CPAP, Disp: , Rfl:    potassium citrate  (UROCIT-K ) 10 MEQ (1080 MG) SR tablet, TAKE ONE TABLET BY MOUTH EVERY MORNING, Disp: 90 tablet, Rfl: 3   roflumilast (DALIRESP) 500 MCG TABS tablet, Take 500 mcg by mouth daily., Disp: , Rfl:    silver  sulfADIAZINE  (SILVADENE ) 1 % cream, Apply 1 Application topically 2 (two) times daily., Disp: 50 g, Rfl: 3   torsemide  (DEMADEX ) 20 MG tablet, TAKE ONE TABLET BY MOUTH DAILY, Disp: 90 tablet, Rfl: 3   traZODone  (DESYREL ) 150 MG tablet,  Take 150 mg by mouth at bedtime., Disp: , Rfl:    triamcinolone  cream (KENALOG ) 0.1 %, Apply twice daily to affected areas on arms and legs until smooth. Avoid applying to face, groin, and axilla., Disp: 454 g, Rfl: 2  Physical exam:  Vitals:   09/11/23 1258  BP: 124/63  Pulse: 86  Resp: 16  Temp: (!) 97.4 F (36.3 C)  TempSrc: Tympanic  SpO2: 97%  Weight: 218 lb (98.9 kg)   Physical Exam Vitals reviewed.  Constitutional:      Appearance: She is obese.  Chest:     Comments: Breast exam was performed in seated and lying down position. Patient is status post right lumpectomy with a well-healed surgical scar. No evidence of any palpable masses. No evidence of axillary adenopathy. No evidence of any palpable masses or lumps in the left breast.  No evidence of left axillary adenopathy  Neurological:     Mental Status: She is alert.    No results found. 01/05/2023- MM 3D Diagnostic Mammogram Bilateral EXAM: DIGITAL DIAGNOSTIC BILATERAL MAMMOGRAM WITH TOMOSYNTHESIS AND CAD   TECHNIQUE: Bilateral digital diagnostic mammography and breast tomosynthesis was performed. The images were evaluated with computer-aided detection.   COMPARISON:  Previous exam(s).   ACR Breast Density Category b: There are scattered areas of fibroglandular density.   FINDINGS: Expected surgical changes noted in the upper-outer quadrant of the right breast consistent with history of lumpectomy. No suspicious calcifications, masses or areas of distortion are seen in the bilateral breasts.   IMPRESSION: Expected surgical changes in the right breast consistent with history of lumpectomy. No evidence of malignancy in the bilateral breasts.   RECOMMENDATION: Diagnostic mammogram is suggested in 1 year. (Code:[id number]-B-01Y)   I have discussed the findings and recommendations with the patient. If applicable, a reminder letter will be sent to the patient regarding the next appointment.   BI-RADS CATEGORY  2: Benign.  Assessment and plan- Patient is a 68 y.o. female   Right breast pain- no palpable mass on exam. Palpable 'mass' and pain correlates with inframammary ridge. Suspect overuse has caused nerve inflammation vs msk etiology. Conservative treatment discussed. Hold off on imaging for now. If symptoms worsen, recommend diagnostic right mammogram.  DCIS Right Breast - s/p lumpectomy 05/09/22 and XRT 07/05/22. On letrozole . Last mammogram 01/05/23 was bi-rads category 2: benign. She'll follow up with Dr Jacobo as scheduled.    Visit Diagnosis 1. Breast pain, right    Patient expressed understanding and was in agreement with this plan. She also understands that She can call clinic at any time with any questions, concerns, or complaints.   Thank you for  allowing me to participate in the care of this very pleasant patient.   Tinnie Dawn, DNP, AGNP-C, AOCNP [facility] [phone]

## 2023-09-11 NOTE — Telephone Encounter (Addendum)
     Primary Cardiologist: Redell Cave, MD  Chart reviewed as part of pre-operative protocol coverage. Given past medical history and time since last visit, based on ACC/AHA guidelines, [name] [name] would be at acceptable risk for the planned procedure without further cardiovascular testing.   Her RCRI is very low risk, 0.4% risk of major cardiac event.  Patient's aspirin  is not prescribed by cardiology.  Recommendations for holding aspirin  will need to come from prescribing provider.  I will route this recommendation to the requesting party via Epic fax function and remove from pre-op pool.  [name] [name]. Jodel Mayhall NP-C     09/11/2023, 8:41 AM [facility] [address] Office [phone] Fax [phone]

## 2023-10-08 ENCOUNTER — Ambulatory Visit: Payer: 59 | Admitting: Dermatology

## 2023-11-06 ENCOUNTER — Other Ambulatory Visit: Payer: Self-pay | Admitting: Dermatology

## 2023-11-13 ENCOUNTER — Other Ambulatory Visit: Payer: Self-pay | Admitting: Dermatology

## 2023-11-28 ENCOUNTER — Encounter: Admitting: Family

## 2023-12-03 ENCOUNTER — Telehealth: Payer: Self-pay | Admitting: Family

## 2023-12-03 NOTE — Telephone Encounter (Signed)
 Called to confirm/remind patient of their appointment at [facility] on 12/04/23.   Appointment:   [] Confirmed  [] Left mess   [x] No answer/No voice mail  [] VM Full/unable to leave message  [] Phone not in service  Patient reminded to bring all medications and/or complete list.  Confirmed patient has transportation. Gave directions, instructed to utilize valet parking.

## 2023-12-04 ENCOUNTER — Encounter: Admitting: Family

## 2023-12-04 ENCOUNTER — Telehealth: Payer: Self-pay | Admitting: Family

## 2023-12-04 NOTE — Progress Notes (Deleted)
 Advanced Heart Failure Clinic Note     PCP: Tobie Domino, MD @ [facility]  Primary Cardiologist: Darliss Rogue, MD   Chief Complaint:    HPI:  [name] [name] is a 68 y/o female with a history of vitamin D deficiency, obstructive sleep apnea, hyperlipidemia, GERD, pneumonia, PVD, DM, depression, DJD, COPD with oxygen, allergies, breast cancer (DCIS) with lumpectomy 05/09/22, past tobacco use and chronic heart failure.   Echo 05/21/16: RF of 50% along with trivial MR/TR/PR.  Echo 06/06/19: EF of 60-65% Echo 04/04/21: EF of 60-65%, Left atrial size was moderately dilated.   Was in the ED 04/26/23 with back pain and weakness. Head CT negative.   She presents today for a HF follow-up visit with a chief complaint of   ROS: All systems negative except as listed in HPI, PMH and Problem List.  SH:  Social History   Socioeconomic History   Marital status: Divorced    Spouse name: Not on file   Number of children: Not on file   Years of education: Not on file   Highest education level: Not on file  Occupational History   Occupation: disabled  Tobacco Use   Smoking status: Former    Current packs/day: 0.00    Average packs/day: 3.0 packs/day for 20.0 years (60.0 ttl pk-yrs)    Types: Cigarettes    Start date: 12/18/1973    Quit date: 12/18/1993    Years since quitting: 29.9    Passive exposure: Past   Smokeless tobacco: Never  Vaping Use   Vaping status: Never Used  Substance and Sexual Activity   Alcohol use: No   Drug use: No    Types: Marijuana   Sexual activity: Never    Birth control/protection: Abstinence  Other Topics Concern   Not on file  Social History Narrative   Lives alone   Social Drivers of Health   Financial Resource Strain: Low Risk  (03/09/2017)   Overall Financial Resource Strain (CARDIA)    Difficulty of Paying Living Expenses: Not hard at all  Food Insecurity: Unknown (03/09/2017)   Hunger Vital Sign    Worried About Running Out of  Food in the Last Year: Patient declined    Ran Out of Food in the Last Year: Patient declined  Transportation Needs: Unknown (03/09/2017)   PRAPARE - Administrator, Civil Service (Medical): Patient declined    Lack of Transportation (Non-Medical): Patient declined  Physical Activity: Unknown (03/09/2017)   Exercise Vital Sign    Days of Exercise per Week: Patient declined    Minutes of Exercise per Session: Patient declined  Stress: No Stress Concern Present (03/09/2017)   [employer] of Occupational Health - Occupational Stress Questionnaire    Feeling of Stress : Not at all  Social Connections: Somewhat Isolated (03/09/2017)   Social Connection and Isolation Panel    Frequency of Communication with Friends and Family: More than three times a week    Frequency of Social Gatherings with Friends and Family: Once a week    Attends Religious Services: More than 4 times per year    Active Member of [employer] or Organizations: No    Attends Banker Meetings: Never    Marital Status: Divorced  Catering manager Violence: Unknown (03/09/2017)   Humiliation, Afraid, Rape, and Kick questionnaire    Fear of Current or Ex-Partner: Patient declined    Emotionally Abused: Patient declined    Physically Abused: Patient declined    Sexually  Abused: Patient declined    FH:  Family History  Problem Relation Age of Onset   Rashes / Skin problems Father    Hypertension Father    Heart disease Father    Breast cancer Paternal Grandmother    COPD Mother    Kidney cancer Neg Hx    Bladder Cancer Neg Hx     Past Medical History:  Diagnosis Date   (HFpEF) heart failure with preserved ejection fraction (HCC)    a.) TTE 05/21/2016: EF 50%, mild LV dil, ant HK, mild RAE, G1DD; b.) TTE 06/06/2019: EF 60-65%, G2DD; c.) TTE 04/04/2021: EF 60-65%, mod LAE, mild RAE, mod MAC, mild MV stenosis, G2DD   Allergic rhinitis    Allergy    Aortic atherosclerosis (HCC)    Asthma     CHF (congestive heart failure) (HCC)    Chronic back pain    COPD (chronic obstructive pulmonary disease) (HCC)    Degenerative joint disease of knee, left    Depression    Diverticulosis    Dyspnea    Edema    GERD (gastroesophageal reflux disease)    Headache    Hidradenitis    History of bilateral cataract extraction    History of colonic polyps    HLD (hyperlipidemia)    Hypertension    Hypothyroidism    IBS (irritable bowel syndrome)    IDA (iron  deficiency anemia)    Insomnia    a.) takes trazodone  PRN   Intraductal papilloma of breast, right    Mitral valve stenosis 04/04/2021   a.) TTE 04/04/2021: mild [name] (MPG 5.0 mm Hg).   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     albuterol  (PROVENTIL  HFA;VENTOLIN  HFA) 108 (90 Base) MCG/ACT inhaler Inhale 2 puffs into the lungs every 6 (six) hours as needed for wheezing or shortness of breath.     aspirin  EC 81 MG tablet Take 81 mg by mouth daily.     atorvastatin  (LIPITOR) 20 MG tablet Take 20 mg by mouth at bedtime.     Budeson-Glycopyrrol-Formoterol  (BREZTRI  AEROSPHERE) 160-9-4.8 MCG/ACT AERO Inhale 2 puffs into the lungs in the morning and at bedtime. 10.7 g 11   clobetasol  (TEMOVATE ) 0.05 % external solution Apply twice daily to scalp as needed for itching. 50 mL 3   Colchicine 0.6 MG CAPS Take by mouth.     cyclobenzaprine (FLEXERIL) 10 MG tablet Take 10 mg by mouth at bedtime as needed.     doxepin  (SINEQUAN ) 50 MG  capsule Take 50 mg by mouth at bedtime.     DULoxetine  (CYMBALTA ) 60 MG capsule Take 60 mg by mouth daily.     Fluocinolone  Acetonide Body 0.01 % OIL Apply to scalp BID as needed until irritation resolves 120 mL 5   fluticasone  (FLONASE ) 50 MCG/ACT nasal spray as needed.     gabapentin  (NEURONTIN ) 600 MG tablet  Take 600 mg by mouth. 1 tablet in the am, 1 tablet in the pm and 2 tablets at night  Pt reports taking 1 tablet at night     glucose blood test strip TEST three times a day     HUMULIN  R U-500 KWIKPEN 500 UNIT/ML KwikPen Inject 3 mLs into the skin. 75 units in the am and 65 units in the evening     hydroxychloroquine (PLAQUENIL) 200 MG tablet Take 1 tablet by mouth 2 (two) times daily.     ipratropium (ATROVENT HFA) 17 MCG/ACT inhaler Inhale 2 puffs into the lungs every 6 (six) hours as needed for wheezing. Patient uses once a day     JARDIANCE 25 MG TABS tablet Take 25 mg by mouth daily.     ketoconazole  (NIZORAL ) 2 % shampoo SHAMPOO INTO SCALP AND LET SIT FOR 10 MINUTES AND THEN WASH OUT; USE 3 DAYS PER WEEK AS DIRECTED 120 mL 11   Lancets Misc. (ACCU-CHEK FASTCLIX LANCET) KIT      letrozole  (FEMARA ) 2.5 MG tablet TAKE ONE TABLET BY MOUTH EVERY DAY 90 tablet 3   levocetirizine (XYZAL ) 5 MG tablet Take 1 tablet (5 mg total) by mouth every evening. 30 tablet 11   levothyroxine  (SYNTHROID ) 150 MCG tablet Take 150 mcg by mouth daily.     lidocaine  (LIDODERM ) 5 % Place 1 patch onto the skin every 12 (twelve) hours. Remove & Discard patch within 12 hours or as directed by MD 10 patch 0   LINZESS 145 MCG CAPS capsule Take 145 mcg by mouth daily.     meclizine  (ANTIVERT ) 25 MG tablet Take 25 mg by mouth 2 (two) times daily as needed.     meclizine  (ANTIVERT ) 25 MG tablet Take 1 tablet (25 mg total) by mouth 3 (three) times daily as needed for dizziness. 30 tablet 0   Melatonin 5 MG CAPS Take 2 capsules by mouth daily.     meloxicam  (MOBIC ) 15 MG tablet Take 15 mg by mouth daily.      methocarbamol  (ROBAXIN ) 500 MG tablet Take 1 tablet (500 mg total) by mouth every 8 (eight) hours as needed. 30 tablet 0   montelukast  (SINGULAIR ) 10 MG tablet Take 10 mg by mouth daily.     MOUNJARO 10 MG/0.5ML Pen Inject 10 mg into the skin once a week.     mupirocin  ointment (BACTROBAN ) 2 % Apply 1 Application topically 2 (two) times daily. Start Mupirocin  2% ointment to aa BID and cover with bandage until healed. 22 g 0   olopatadine  (PATANOL) 0.1 % ophthalmic solution Place 1 drop into both eyes 2 (two) times daily.     omeprazole (PRILOSEC) 40 MG capsule Take 40 mg by mouth daily.     OXYGEN Inhale into the lungs. With CPAP     potassium citrate  (UROCIT-K ) 10 MEQ (1080 MG) SR tablet TAKE ONE TABLET BY MOUTH EVERY MORNING 90 tablet 3   roflumilast (DALIRESP) 500 MCG TABS tablet Take 500 mcg by mouth daily.     silver  sulfADIAZINE  (SILVADENE ) 1 % cream Apply 1 Application topically 2 (two) times daily. 50 g 3   torsemide  (DEMADEX ) 20 MG tablet TAKE ONE TABLET BY MOUTH DAILY 90 tablet 3   traZODone  (DESYREL ) 150 MG tablet Take 150 mg by mouth at bedtime.     triamcinolone  cream (KENALOG ) 0.1 % Apply twice daily to affected areas on arms and legs until smooth. Avoid applying to face, groin, and axilla. 454 g 2  No current facility-administered medications for this visit.      PHYSICAL EXAM:  General: Well appearing. No resp difficulty HEENT: normal Neck: supple, no JVD Cor: Regular rhythm, rate. No rubs, gallops or murmurs Lungs: expiratory wheezing throughout lung fields Abdomen: soft, nontender, nondistended. Extremities: no cyanosis, clubbing, rash, edema Neuro: alert & oriented X 3. Moves all 4 extremities w/o difficulty. Affect pleasant   ECG: not done   ASSESSMENT & PLAN:  1:NICM with preserved ejection fraction- - suspect due to HTN/ COPD - NYHA class II - euvolemic today - weight 228.8 pounds from last visit here 6 months ago - Echo 05/21/16: EF of 50% along with  trivial MR/TR/PR.  - Echo 06/06/19: EF of 60-65% - Echo 04/04/21: EF of 60-65%, Left atrial size was moderately dilated - Did not get echo done. New order placed today - not adding salt and is using a salt substitute. Using meals on wheels for food; reminded her to follow a 2000 mg sodium diet - saw cardiology (Agbor-Etang) 05/25 - continue jardiance 25mg  daily (DM dose) - continue torsemide  20mg  daily/ potassium 10meq daily - entresto  had previously been stopped due to hypotension - BP may not tolerate spironolactone  - wearing CPAP nightly - BNP 05/20/16 was 36.0  2: HTN- - BP  - follows with PCP at [facility] - BMP 04/26/23 reviewed: sodium 135, potassium 4.0, creatinine 1.27 and GFR 47  3: Diabetes- - A1C reviewed on 05/16/23 was 7.8% - saw endocrinologist (Solum) 06/19 - continue jardiance 25mg  daily - continue mounjaro 10mg  weekly - continue atorvastatin  20mg  daily   4: COPD- - wears oxygen at 2L at bedtime along with CPAP - saw pulmonologist Gwenette) 08/24; returns 05/25     Ellouise DELENA Class, FNP 12/04/23

## 2023-12-04 NOTE — Telephone Encounter (Signed)
 Patient did not show for her Heart Failure Clinic appointment on 12/04/23.

## 2024-01-01 ENCOUNTER — Other Ambulatory Visit: Payer: Self-pay | Admitting: Family

## 2024-01-01 DIAGNOSIS — I5032 Chronic diastolic (congestive) heart failure: Secondary | ICD-10-CM

## 2024-01-07 ENCOUNTER — Ambulatory Visit
Admission: RE | Admit: 2024-01-07 | Discharge: 2024-01-07 | Disposition: A | Discharge status: Home / Self Care | Source: Ambulatory Visit | Attending: Oncology | Admitting: Oncology

## 2024-01-07 DIAGNOSIS — Z853 Personal history of malignant neoplasm of breast: Secondary | ICD-10-CM | POA: Diagnosis present

## 2024-01-14 ENCOUNTER — Inpatient Hospital Stay: Attending: Oncology | Admitting: Oncology

## 2024-01-20 ENCOUNTER — Other Ambulatory Visit: Payer: Self-pay | Admitting: Family

## 2024-01-20 DIAGNOSIS — I5032 Chronic diastolic (congestive) heart failure: Secondary | ICD-10-CM

## 2024-01-21 ENCOUNTER — Telehealth: Payer: Self-pay

## 2024-01-21 NOTE — Telephone Encounter (Signed)
 Hey! Could you confirm if you want this patient to continue this script? Thank you!

## 2024-01-21 NOTE — Telephone Encounter (Signed)
 Attempted to reach patient to reschedule missed appointment. Unable to reach. No voicemail set up.

## 2024-04-03 ENCOUNTER — Encounter: Admitting: Physician Assistant

## 2024-04-09 ENCOUNTER — Encounter: Payer: Self-pay | Admitting: Internal Medicine

## 2024-04-09 ENCOUNTER — Ambulatory Visit: Admitting: Internal Medicine

## 2024-04-09 VITALS — BP 90/60 | HR 81 | Temp 98.7°F | Ht 63.0 in | Wt 205.4 lb

## 2024-04-09 DIAGNOSIS — J410 Simple chronic bronchitis: Secondary | ICD-10-CM

## 2024-04-09 DIAGNOSIS — E669 Obesity, unspecified: Secondary | ICD-10-CM

## 2024-04-09 DIAGNOSIS — J441 Chronic obstructive pulmonary disease with (acute) exacerbation: Secondary | ICD-10-CM | POA: Diagnosis not present

## 2024-04-09 DIAGNOSIS — J4520 Mild intermittent asthma, uncomplicated: Secondary | ICD-10-CM

## 2024-04-09 DIAGNOSIS — G4733 Obstructive sleep apnea (adult) (pediatric): Secondary | ICD-10-CM

## 2024-04-09 DIAGNOSIS — J9611 Chronic respiratory failure with hypoxia: Secondary | ICD-10-CM | POA: Diagnosis not present

## 2024-04-09 DIAGNOSIS — Z87891 Personal history of nicotine dependence: Secondary | ICD-10-CM

## 2024-04-09 DIAGNOSIS — R062 Wheezing: Secondary | ICD-10-CM

## 2024-04-09 DIAGNOSIS — J449 Chronic obstructive pulmonary disease, unspecified: Secondary | ICD-10-CM

## 2024-04-09 MED ORDER — AZITHROMYCIN 250 MG PO TABS: ORAL_TABLET | ORAL | 0 refills | Status: AC

## 2024-04-09 MED ORDER — FLUCONAZOLE 100 MG PO TABS: 100.0000 mg | ORAL_TABLET | Freq: Every day | ORAL | 0 refills | Status: AC

## 2024-04-09 NOTE — Progress Notes (Signed)
 "   SYNOPSIS [name] [name] is a 69 y.o. female former smoker ( 35 pack year history , quit 1995 ) with history of  COPD , allergies, and sleep apnea per in lab sleep study done 4-5 years ago. She had a CPAP machine, but she could not tolerate it. Consult to evaluate if pt. Is still + for OSA.    CC  Follow-up assessment for COPD Follow-up assessment for OSA   HPI Regarding COPD Patient currently on maintenance therapy Patient continues to take Breztri  inhaler therapy This seems to be helping her breathing Patient with wheezing at this time No signs of infection Mild COPD exacerbation Plan for azithromycin  as patient is refusing prednisone   Severe OSA Previous AHI of 60 severe sleep apnea Discussed sleep data and reviewed with patient.  Encouraged proper weight management.  Discussed sleep hygiene Patient uses and benefits from therapy Using CPAP nightly and with naps Settings are comfortable and is sleeping well. Auto CPAP 8-18 AHI reduced to 0.4  Morbid obesity Patient on Mounjaro Last weight 250 pounds down to 226   Past Medical History:  Diagnosis Date   (HFpEF) heart failure with preserved ejection fraction (HCC)    a.) TTE 05/21/2016: EF 50%, mild LV dil, ant HK, mild RAE, G1DD; b.) TTE 06/06/2019: EF 60-65%, G2DD; c.) TTE 04/04/2021: EF 60-65%, mod LAE, mild RAE, mod MAC, mild MV stenosis, G2DD   Allergic rhinitis    Allergy    Aortic atherosclerosis    Asthma    CHF (congestive heart failure) (HCC)    Chronic back pain    COPD (chronic obstructive pulmonary disease) (HCC)    Degenerative joint disease of knee, left    Depression    Diverticulosis    Dyspnea    Edema    GERD (gastroesophageal reflux disease)    Headache    Hidradenitis    History of bilateral cataract extraction    History of colonic polyps    HLD (hyperlipidemia)    Hypertension    Hypothyroidism    IBS (irritable bowel syndrome)    IDA (iron  deficiency anemia)    Insomnia     a.) takes trazodone  PRN   Intraductal papilloma of breast, right    Mitral valve stenosis 04/04/2021   a.) TTE 04/04/2021: mild MS (MPG 5.0 mm Hg).   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 There is wall motion abnormality    suggestive of coronary artery disease and mild diastolic    dysfunction.   Sleep Study AHI 60       Latest Ref Rng & Units 04/26/2023    5:09 PM 06/29/2022   11:51 AM 10/07/2019    2:16 PM  CBC  WBC 4.0 - 10.5 K/uL 8.6  6.3  21.4   Hemoglobin 12.0 - 15.0 g/dL 85.8  86.1  13.8  Hematocrit 36.0 - 46.0 % 43.5  43.6  41.9   Platelets 150 - 400 K/uL 204  213  275        Latest Ref Rng & Units 04/26/2023    5:09 PM 05/12/2021    3:18 PM 01/17/2021   11:33 AM  BMP  Glucose 70 - 99 mg/dL 831  804  844   BUN 8 - 23 mg/dL 18  33  18   Creatinine 0.44 - 1.00 mg/dL 8.72  8.83  8.91   Sodium 135 - 145 mmol/L 135  132  137   Potassium 3.5 - 5.1 mmol/L 4.0  4.4  4.3   Chloride 98 - 111 mmol/L 97  98  101   CO2 22 - 32 mmol/L 28  25  27    Calcium  8.9 - 10.3 mg/dL 9.4  9.2  9.3     BNP    Component Value Date/Time   BNP 36.0 05/20/2016 2155    ProBNP No results found for: PROBNP  PFT No results found for: FEV1PRE, FEV1POST, FVCPRE, FVCPOST, TLC, DLCOUNC, PREFEV1FVCRT, PSTFEV1FVCRT  No results found.    Past medical hx Past Medical History:  Diagnosis Date   (HFpEF) heart failure with preserved  ejection fraction (HCC)    a.) TTE 05/21/2016: EF 50%, mild LV dil, ant HK, mild RAE, G1DD; b.) TTE 06/06/2019: EF 60-65%, G2DD; c.) TTE 04/04/2021: EF 60-65%, mod LAE, mild RAE, mod MAC, mild MV stenosis, G2DD   Allergic rhinitis    Allergy    Aortic atherosclerosis    Asthma    CHF (congestive heart failure) (HCC)    Chronic back pain    COPD (chronic obstructive pulmonary disease) (HCC)    Degenerative joint disease of knee, left    Depression    Diverticulosis    Dyspnea    Edema    GERD (gastroesophageal reflux disease)    Headache    Hidradenitis    History of bilateral cataract extraction    History of colonic polyps    HLD (hyperlipidemia)    Hypertension    Hypothyroidism    IBS (irritable bowel syndrome)    IDA (iron  deficiency anemia)    Insomnia    a.) takes trazodone  PRN   Intraductal papilloma of breast, right    Mitral valve stenosis 04/04/2021   a.) TTE 04/04/2021: mild MS (MPG 5.0 mm Hg).   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    Sinusitis, chronic    T2DM (type 2 diabetes mellitus) (HCC)    Vaginitis, atrophic    Vertigo    Vitamin D deficiency      Social History   Tobacco Use   Smoking status: Former    Current packs/day: 0.00    Average packs/day: 3.0 packs/day for 20.0 years (60.0 ttl pk-yrs)    Types: Cigarettes    Start date: 12/18/1973    Quit date: 12/18/1993    Years since quitting: 30.3    Passive exposure: Past   Smokeless tobacco: Never  Vaping Use   Vaping status: Never Used  Substance Use Topics   Alcohol use: No   Drug use: No    Types: Marijuana    Ms.Whirley reports that she  quit smoking about 30 years ago. Her smoking use included cigarettes. She started smoking about 50 years ago. She has a 60  pack-year smoking history. She has been exposed to tobacco smoke. She has never used smokeless tobacco. She reports that she does not drink alcohol and does not use drugs.  Tobacco Cessation: Former smoker with a 35 pack year smoking history  Past surgical hx, Family hx, Social hx all reviewed.  Current Outpatient Medications on File Prior to Visit  Medication Sig   Continuous Glucose Receiver (FREESTYLE LIBRE 2 READER) DEVI use to check blood sugar continuously for diabetes   hydrOXYzine (ATARAX) 25 MG tablet Take 25 mg by mouth every 4 (four) hours as needed for anxiety, itching, nausea or vomiting.   levothyroxine  (SYNTHROID ) 137 MCG tablet Take 137 mcg by mouth daily.   mirtazapine (REMERON SOL-TAB) 15 MG disintegrating tablet Take 15 mg by mouth at bedtime.   MOUNJARO 15 MG/0.5ML Pen Inject 15 mg into the skin once a week.   predniSONE  (DELTASONE ) 20 MG tablet Take 40 mg by mouth daily.   ULTICARE MINI PEN NEEDLES 31G X 6 MM MISC 2 (two) times daily.   acetaminophen  (TYLENOL ) 500 MG tablet Take 500 mg by mouth every 6 (six) hours as needed.   albuterol  (PROVENTIL  HFA;VENTOLIN  HFA) 108 (90 Base) MCG/ACT inhaler Inhale 2 puffs into the lungs every 6 (six) hours as needed for wheezing or shortness of breath.   albuterol  (PROVENTIL ) (2.5 MG/3ML) 0.083% nebulizer solution Take 2.5 mg by nebulization every 4 (four) hours as needed for wheezing or shortness of breath (or cough).   aspirin  EC 81 MG tablet Take 81 mg by mouth daily.   atorvastatin  (LIPITOR) 20 MG tablet Take 20 mg by mouth at bedtime.   Budeson-Glycopyrrol-Formoterol  (BREZTRI  AEROSPHERE) 160-9-4.8 MCG/ACT AERO Inhale 2 puffs into the lungs in the morning and at bedtime.   clobetasol  (TEMOVATE ) 0.05 % external solution Apply twice daily to scalp as needed for itching.   Colchicine 0.6 MG CAPS Take by mouth.   Continuous Glucose Sensor (FREESTYLE LIBRE 2 PLUS SENSOR) MISC SMARTSIG:Every 3 Days   cyclobenzaprine (FLEXERIL) 10 MG  tablet Take 10 mg by mouth at bedtime as needed.   doxepin  (SINEQUAN ) 50 MG capsule Take 50 mg by mouth at bedtime.   DULoxetine  (CYMBALTA ) 60 MG capsule Take 60 mg by mouth daily.   Eszopiclone 3 MG TABS Take 3 mg by mouth at bedtime.   Fluocinolone  Acetonide Body 0.01 % OIL Apply to scalp BID as needed until irritation resolves   fluticasone  (FLONASE ) 50 MCG/ACT nasal spray as needed.   gabapentin  (NEURONTIN ) 600 MG tablet Take 600 mg by mouth. 1 tablet in the am, 1 tablet in the pm and 2 tablets at night  Pt reports taking 1 tablet at night   glucose blood test strip TEST three times a day   HUMULIN  R U-500 KWIKPEN 500 UNIT/ML KwikPen Inject 3 mLs into the skin. 75 units in the am and 65 units in the evening   hydroxychloroquine (PLAQUENIL) 200 MG tablet Take 1 tablet by mouth 2 (two) times daily.   ibuprofen  (IBU) 800 MG tablet Take 800 mg by mouth every 8 (eight) hours as needed for fever, headache, moderate pain (pain score 4-6) or cramping.   ipratropium (ATROVENT HFA) 17 MCG/ACT inhaler Inhale 2 puffs into the lungs every 6 (six) hours as needed for wheezing. Patient uses once a day   JARDIANCE 25 MG TABS tablet Take 25 mg by  mouth daily.   ketoconazole  (NIZORAL ) 2 % shampoo SHAMPOO INTO SCALP AND LET SIT FOR 10 MINUTES AND THEN WASH OUT; USE 3 DAYS PER WEEK AS DIRECTED   Lancets Misc. (ACCU-CHEK FASTCLIX LANCET) KIT    letrozole  (FEMARA ) 2.5 MG tablet TAKE ONE TABLET BY MOUTH EVERY DAY   levocetirizine (XYZAL ) 5 MG tablet Take 1 tablet (5 mg total) by mouth every evening.   lidocaine  (LIDODERM ) 5 % Place 1 patch onto the skin every 12 (twelve) hours. Remove & Discard patch within 12 hours or as directed by MD   LINZESS 145 MCG CAPS capsule Take 145 mcg by mouth daily.   meclizine  (ANTIVERT ) 25 MG tablet Take 25 mg by mouth 2 (two) times daily as needed.   meclizine  (ANTIVERT ) 25 MG tablet Take 1 tablet (25 mg total) by mouth 3 (three) times daily as needed for dizziness.   Melatonin 5  MG CAPS Take 2 capsules by mouth daily.   meloxicam  (MOBIC ) 15 MG tablet Take 15 mg by mouth daily.   methocarbamol  (ROBAXIN ) 500 MG tablet Take 1 tablet (500 mg total) by mouth every 8 (eight) hours as needed.   mometasone  (NASONEX ) 50 MCG/ACT nasal spray Place 2 sprays into the nose daily.   montelukast  (SINGULAIR ) 10 MG tablet Take 10 mg by mouth daily.   mupirocin  ointment (BACTROBAN ) 2 % Apply 1 Application topically 2 (two) times daily. Start Mupirocin  2% ointment to aa BID and cover with bandage until healed.   olopatadine  (PATANOL) 0.1 % ophthalmic solution Place 1 drop into both eyes 2 (two) times daily.   omeprazole (PRILOSEC) 40 MG capsule Take 40 mg by mouth daily.   OXYGEN Inhale into the lungs. With CPAP   potassium citrate  (UROCIT-K ) 10 MEQ (1080 MG) SR tablet TAKE ONE TABLET BY MOUTH EVERY MORNING   roflumilast (DALIRESP) 500 MCG TABS tablet Take 500 mcg by mouth daily.   sacubitril -valsartan  (ENTRESTO ) 24-26 MG Take 1 tablet by mouth 2 (two) times daily.   silver  sulfADIAZINE  (SILVADENE ) 1 % cream Apply 1 Application topically 2 (two) times daily.   torsemide  (DEMADEX ) 20 MG tablet TAKE ONE TABLET BY MOUTH DAILY   traZODone  (DESYREL ) 150 MG tablet Take 150 mg by mouth at bedtime.   triamcinolone  cream (KENALOG ) 0.1 % Apply twice daily to affected areas on arms and legs until smooth. Avoid applying to face, groin, and axilla.   No current facility-administered medications on file prior to visit.     Allergies  Allergen Reactions   Codeine Itching   Levofloxacin  Rash    welts & itching   There were no vitals taken for this visit.     Physical Examination:  General Appearance: No distress  EYES EOM intact.   NECK Supple, No JVD Pulmonary: normal breath sounds, No wheezing.  CardiovascularNormal S1,S2.  No m/r/g.   Ext pulses intact, cap refill intact  ALL OTHER ROS ARE NEGATIVE       Assessment/Plan   69 year old obese white female with severe OSA with  AHI of 60 with chronic hypoxic respiratory failure in the setting of COPD   Assessment of OSA Previous AHI 60 Continue CPAP as prescribed  Excellent compliance report Reviewed compliance report in detail with patient Patient definitely benefits the use of CPAP therapy as prescribed Using CPAP nightly and with naps Pressure setting is comfortable and is sleeping well. CPAP prescription 8-18 AHI reduced to 0.4  No evidence of acute heart failure at this time No respiratory distress No fevers,  chills, nausea, vomiting, diarrhea No evidence hemoptysis  Patient Instructions Continue to use CPAP every night, minimum of 4-6 hours a night.  Change equipment every 30 days or as directed by DME.  Wash your tubing with warm soap and water  daily, hang to dry.  Wash humidifier portion weekly. Use bottled, distilled water  and change daily  Risk of untreated sleep apnea including cardiac arrhthymias, stroke, DM, pulm HTN.    COPD Severe exacerbation at this time from environmental exposure Will treat for atypical pneumonia will start azithromycin  Patient does not want prednisone  at this time Patient also asking for Diflucan  to help prevent yeast infection Continue Breztri  inhaler as prescribed Use albuterol  as needed Rinse mouth after use  Chronic Hypoxic resp failure due to COPD Continue oxygen as prescribed Patient uses for survival  Obesity -recommend significant weight loss -recommend changing diet  Deconditioned state -Recommend increased daily activity and exercise    MEDICATION ADJUSTMENTS/LABS AND TESTS ORDERED: Excellent job with CPAP continue CPAP as prescribed Continue Breztri  as prescribed rinse mouth after use Azithromycin  and Diflucan  as requested by patient Avoid Allergens and Irritants Avoid secondhand smoke Avoid SICK contacts Recommend  Masking  when appropriate Recommend Keep up-to-date with vaccinations    CURRENT MEDICATIONS REVIEWED AT LENGTH WITH  PATIENT TODAY   Patient  satisfied with Plan of action and management. All questions answered   Follow up 1 year   I spent a total of 45 minutes dedicated to the care of this patient on the date of this encounter to include pre-visit review of records, face-to-face time with the patient discussing conditions above, post visit ordering of testing, clinical documentation with the electronic health record, making appropriate referrals as documented, and communicating necessary information to the patient's healthcare team.    The Patient requires high complexity decision making for assessment and support, frequent evaluation and titration of therapies, application of advanced monitoring technologies and extensive interpretation of multiple databases.  Patient satisfied with Plan of action and management. All questions answered    Nickolas Alm Cellar, M.D.  [facility]  Medical Director [facility] [place]         "

## 2024-04-09 NOTE — Patient Instructions (Addendum)
 Excellent Job A+ GOLD STAR!!  Continue CPAP as prescribed  Patient Instructions Continue to use CPAP every night, minimum of 4-6 hours a night.  Change equipment every 30 days or as directed by DME.  Wash your tubing with warm soap and water  daily, hang to dry. Wash humidifier portion weekly. Use bottled, distilled water  and change daily   Be aware of reduced alertness and do not drive or operate heavy machinery if experiencing this or drowsiness.  Exercise encouraged, as tolerated. Encouraged proper weight management.  Important to get eight or more hours of sleep  Limiting the use of the computer and television before bedtime.  Decrease naps during the day, so night time sleep will become enhanced.  Limit caffeine, and sleep deprivation.    Avoid Allergens and Irritants Avoid secondhand smoke Avoid SICK contacts Recommend  Masking  when appropriate Recommend Keep up-to-date with vaccinations  Continue Breztri  inhaler as prescribed Rinse mouth after use Start azithromycin  for wheezing Start Diflucan  for yeast infection

## 2024-04-10 NOTE — Addendum Note (Signed)
 Addended byBETHA CELLAR, DAVID on: 04/10/2024 12:29 PM   Modules accepted: Level of Service

## 2024-04-17 ENCOUNTER — Telehealth: Payer: Self-pay | Admitting: Family

## 2024-04-17 ENCOUNTER — Other Ambulatory Visit: Payer: Self-pay | Admitting: Oncology

## 2024-04-17 NOTE — Telephone Encounter (Signed)
 Called to confirm/remind patient of their appointment [facility] on [facility]/18/[facility].   Appointment:   [x] Confirmed  [] Left mess   [] No answer/No voice mail  [] VM Full/unable to leave message  [] Phone not in service  Patient reminded to bring all medications and/or complete list.  Confirmed patient has transportation. Gave directions, instructed to utilize valet parking.

## 2024-04-17 NOTE — Progress Notes (Unsigned)
 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   Was in the ED 04/26/23 with back pain and weakness. Head CT negative.   Echo 05/21/16: RF of 50% along with trivial MR/TR/PR.  Echo 06/06/19: EF of 60-65% Echo 04/04/21: EF of 60-65%, Left atrial size was moderately dilated.   She presents today for a HF follow-up visit with a chief complaint of shortness of breath. Has associated fatigue (improving) and chronic back pain along with this. Denies chest pain, cough, palpitations, abdominal distention, pedal edema, dizziness, difficulty sleeping or weight gain. Has been walking for exercise. Using mounjaro with good weight loss results. Sleeping well on 1 pillow with oxygen and CPAP nightly.   Right hammertoe/ bunion present that she will be needing surgery to correct it.    ROS: All systems negative except as listed in HPI, PMH and Problem List.  SH:  Social History   Socioeconomic History   Marital status: Divorced    Spouse name: Not on file   Number of children: Not on file   Years of education: Not on file   Highest education level: Not on file  Occupational History   Occupation: disabled  Tobacco Use   Smoking status: Former    Current packs/day: 0.00    Average packs/day: 3.0 packs/day for 20.0 years (60.0 ttl pk-yrs)    Types: Cigarettes    Start date: 12/18/1973    Quit date: 12/18/1993    Years since quitting: 30.3    Passive exposure: Past   Smokeless tobacco: Never  Vaping Use   Vaping status: Never Used  Substance and Sexual  Activity   Alcohol use: No   Drug use: No    Types: Marijuana   Sexual activity: Never    Birth control/protection: Abstinence  Other Topics Concern   Not on file  Social History Narrative   Lives alone   Social Drivers of Health   Tobacco Use: Medium Risk (04/09/2024)   Patient History    Smoking Tobacco Use: Former    Smokeless Tobacco Use: Never    Passive Exposure: Past  Programmer, 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 [employer] Insecurity: Not on file  Transportation Needs: Not on file  Physical Activity: Not on file  Stress: Not on file  Social Connections: Not on file  Intimate Partner Violence: Not on file  Depression (PHQ2-9): Low Risk (09/11/2023)   Depression (PHQ2-9)    PHQ-2 Score: 0  Alcohol Screen: Not on file  Housing: Not on file  Utilities: Not on file  Health Literacy: Not on file    FH:  Family History  Problem Relation Age of Onset   Rashes / Skin problems Father    Hypertension Father    Heart disease Father    Breast cancer Paternal Grandmother    COPD Mother    Kidney cancer Neg Hx    Bladder Cancer Neg Hx     Past Medical History:  Diagnosis Date   (HFpEF) heart failure with preserved ejection fraction (HCC)    a.) TTE 05/21/2016: EF 50%, mild LV dil,  ant HK, mild RAE, G1DD; b.) TTE 06/06/2019: EF 60-65%, G2DD; c.) TTE 04/04/2021: EF 60-65%, mod LAE, mild RAE, mod MAC, mild MV stenosis, G2DD   Allergic rhinitis    Allergy    Aortic atherosclerosis    Asthma    CHF (congestive heart failure) (HCC)    Chronic back pain    COPD (chronic obstructive pulmonary disease) (HCC)    Degenerative joint disease of knee, left    Depression    Diverticulosis    Dyspnea    Edema    GERD (gastroesophageal reflux disease)    Headache    Hidradenitis    History of bilateral cataract extraction    History of colonic polyps    HLD (hyperlipidemia)    Hypertension    Hypothyroidism    IBS (irritable bowel syndrome)    IDA (iron  deficiency anemia)     Insomnia    a.) takes trazodone  PRN   Intraductal papilloma of breast, right    Mitral valve stenosis 04/04/2021   a.) TTE 04/04/2021: mild [name] (MPG 5.0 mm Hg).   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     albuterol  (PROVENTIL  HFA;VENTOLIN  HFA) 108 (90 Base) MCG/ACT inhaler Inhale 2 puffs into the lungs every 6 (six) hours as needed for wheezing or shortness of breath.     albuterol  (PROVENTIL ) (2.5 MG/3ML) 0.083% nebulizer solution Take 2.5 mg by nebulization every 4 (four) hours as needed for wheezing or shortness of breath (or cough).     aspirin  EC 81 MG tablet Take 81 mg by mouth daily.     atorvastatin  (LIPITOR) 20 MG tablet Take 20 mg by mouth at bedtime.     azithromycin  (ZITHROMAX  Z-PAK) 250 MG tablet Take 2 tablets on Day 1 and then 1 tablet daily till gone. 6 each 0   Budeson-Glycopyrrol-Formoterol  (BREZTRI  AEROSPHERE) 160-9-4.8 MCG/ACT AERO Inhale 2 puffs into the lungs in the morning and at bedtime. 10.7 g 11   clobetasol  (TEMOVATE ) 0.05 % external solution Apply twice daily to scalp as needed for itching. 50 mL 3   Colchicine 0.6 MG CAPS Take by mouth.     Continuous Glucose Receiver (FREESTYLE LIBRE 2 READER) DEVI use to check blood sugar continuously for diabetes     Continuous Glucose Sensor (FREESTYLE LIBRE 2 PLUS SENSOR) MISC SMARTSIG:Every 3 Days     cyclobenzaprine (FLEXERIL) 10 MG tablet Take 10 mg by mouth at bedtime as needed.     doxepin  (SINEQUAN ) 50 MG capsule  Take 50 mg by mouth at bedtime.     DULoxetine  (CYMBALTA ) 60 MG capsule Take 60 mg by mouth daily.     Eszopiclone 3 MG TABS Take 3 mg by mouth at bedtime.     Fluocinolone  Acetonide Body 0.01 % OIL Apply to scalp BID as needed until irritation resolves 120 mL 5   fluticasone  (FLONASE ) 50 MCG/ACT nasal  spray as needed.     gabapentin  (NEURONTIN ) 600 MG tablet Take 600 mg by mouth. 1 tablet in the am, 1 tablet in the pm and 2 tablets at night  Pt reports taking 1 tablet at night     glucose blood test strip TEST three times a day     HUMULIN  R U-500 KWIKPEN 500 UNIT/ML KwikPen Inject 3 mLs into the skin. 75 units in the am and 65 units in the evening     hydroxychloroquine (PLAQUENIL) 200 MG tablet Take 1 tablet by mouth 2 (two) times daily.     hydrOXYzine (ATARAX) 25 MG tablet Take 25 mg by mouth every 4 (four) hours as needed for anxiety, itching, nausea or vomiting.     ibuprofen  (IBU) 800 MG tablet Take 800 mg by mouth every 8 (eight) hours as needed for fever, headache, moderate pain (pain score 4-6) or cramping.     ipratropium (ATROVENT HFA) 17 MCG/ACT inhaler Inhale 2 puffs into the lungs every 6 (six) hours as needed for wheezing. Patient uses once a day     JARDIANCE 25 MG TABS tablet Take 25 mg by mouth daily.     ketoconazole  (NIZORAL ) 2 % shampoo SHAMPOO INTO SCALP AND LET SIT FOR 10 MINUTES AND THEN WASH OUT; USE 3 DAYS PER WEEK AS DIRECTED 120 mL 11   Lancets Misc. (ACCU-CHEK FASTCLIX LANCET) KIT      letrozole  (FEMARA ) 2.5 MG tablet TAKE ONE TABLET BY MOUTH EVERY DAY 90 tablet 3   levocetirizine (XYZAL ) 5 MG tablet Take 1 tablet (5 mg total) by mouth every evening. 30 tablet 11   levothyroxine  (SYNTHROID ) 137 MCG tablet Take 137 mcg by mouth daily.     lidocaine  (LIDODERM ) 5 % Place 1 patch onto the skin every 12 (twelve) hours. Remove & Discard patch within 12 hours or as directed by MD 10 patch 0   LINZESS 145 MCG CAPS capsule Take 145 mcg by mouth daily.     meclizine   (ANTIVERT ) 25 MG tablet Take 25 mg by mouth 2 (two) times daily as needed.     meclizine  (ANTIVERT ) 25 MG tablet Take 1 tablet (25 mg total) by mouth 3 (three) times daily as needed for dizziness. 30 tablet 0   Melatonin 5 MG CAPS Take 2 capsules by mouth daily.     meloxicam  (MOBIC ) 15 MG tablet Take 15 mg by mouth daily.     methocarbamol  (ROBAXIN ) 500 MG tablet Take 1 tablet (500 mg total) by mouth every 8 (eight) hours as needed. 30 tablet 0   mirtazapine (REMERON SOL-TAB) 15 MG disintegrating tablet Take 15 mg by mouth at bedtime.     mometasone  (NASONEX ) 50 MCG/ACT nasal spray Place 2 sprays into the nose daily.     montelukast  (SINGULAIR ) 10 MG tablet Take 10 mg by mouth daily.     MOUNJARO 15 MG/0.5ML Pen Inject 15 mg into the skin once a week.     mupirocin  ointment (BACTROBAN ) 2 % Apply 1 Application topically 2 (two) times daily. Start Mupirocin  2% ointment to aa BID and cover with bandage until healed. 22 g 0   olopatadine  (PATANOL) 0.1 % ophthalmic solution Place 1 drop into both eyes 2 (two) times daily.     omeprazole (PRILOSEC) 40 MG capsule Take 40 mg by mouth daily.     OXYGEN Inhale into the lungs. With CPAP     potassium citrate  (UROCIT-K ) 10 MEQ (1080 MG) SR tablet TAKE ONE TABLET BY MOUTH  EVERY MORNING 90 tablet 0   predniSONE  (DELTASONE ) 20 MG tablet Take 40 mg by mouth daily.     roflumilast (DALIRESP) 500 MCG TABS tablet Take 500 mcg by mouth daily.     sacubitril -valsartan  (ENTRESTO ) 24-26 MG Take 1 tablet by mouth 2 (two) times daily.     silver  sulfADIAZINE  (SILVADENE ) 1 % cream Apply 1 Application topically 2 (two) times daily. 50 g 3   torsemide  (DEMADEX ) 20 MG tablet TAKE ONE TABLET BY MOUTH DAILY 90 tablet 0   traZODone  (DESYREL ) 150 MG tablet Take 150 mg by mouth at bedtime.     triamcinolone  cream (KENALOG ) 0.1 % Apply twice daily to affected areas on arms and legs until smooth. Avoid applying to face, groin, and axilla. 454 g 2   ULTICARE MINI PEN NEEDLES 31G X  6 MM MISC 2 (two) times daily.     No current facility-administered medications for this visit.   There were no vitals filed for this visit.  Wt Readings from Last 3 Encounters:  04/09/24 205 lb 6.4 oz (93.2 kg)  09/11/23 218 lb (98.9 kg)  08/10/23 217 lb (98.4 kg)   Lab Results  Component Value Date   CREATININE 1.27 (H) 04/26/2023   CREATININE 1.16 (H) 05/12/2021   CREATININE 1.08 (H) 01/17/2021    PHYSICAL EXAM:  General: Well appearing. No resp difficulty HEENT: normal Neck: supple, no JVD Cor: Regular rhythm, rate. No rubs, gallops or murmurs Lungs: expiratory wheezing throughout lung fields Abdomen: soft, nontender, nondistended. Extremities: no cyanosis, clubbing, rash, edema Neuro: alert & oriented X 3. Moves all 4 extremities w/o difficulty. Affect pleasant   ECG: not done   ASSESSMENT & PLAN:  1:NICM with preserved ejection fraction- - suspect due to HTN/ COPD - NYHA [name] II - euvolemic today - weighing daily; reminded to call for an overnight weight gain of 2 lbs or a weekly weight gain of 5 lbs - weight down 17 pounds from last visit here 6 months ago - Echo 05/21/16: EF of 50% along with trivial MR/TR/PR.  - Echo 06/06/19: EF of 60-65% - Echo 04/04/21: EF of 60-65%, Left atrial size was moderately dilated - will get echo updated for upcoming cardiology visit next month - not adding salt and is using a salt substitute. Using meals on wheels for food; reminded her to follow a 2000 mg sodium diet - saw cardiology (Agbor-Etang) 02/24; returns 04/25 - continue jardiance 25mg  daily (DM dose) - continue torsemide  20mg  daily/ potassium 10meq daily - entresto  had previously been stopped due to hypotension - BP may not tolerate spironolactone  - wearing CPAP nightly - BNP 05/20/16 was 36.0  2: HTN- - BP 113/56 - follows with PCP at [facility] (last seen 2-3 weeks ago) - BMP 04/26/23 reviewed and showed sodium 135, potassium 4.0, creatinine 1.27  and GFR 47  3: Diabetes- - A1C reviewed on 05/16/23 was 7.8% - saw endocrinologist (Solum) 06/19 - continue jardiance 25mg  daily - continue mounjaro 10mg  weekly - continue atorvastatin  20mg  daily   4: COPD- - wears oxygen at 2L at bedtime along with CPAP - saw pulmonologist (Kasa) 08/24; returns 05/25   Return in 6 months, sooner if needed.  [name] DELENA Class, FNP 04/17/24 "

## 2024-04-18 ENCOUNTER — Ambulatory Visit: Admitting: Family

## 2024-04-18 ENCOUNTER — Telehealth: Payer: Self-pay | Admitting: Family

## 2024-04-18 NOTE — Telephone Encounter (Signed)
 Patient did not show for her Heart Failure Clinic appointment on 04/18/24.

## 2024-04-23 ENCOUNTER — Other Ambulatory Visit: Payer: Self-pay | Admitting: Family

## 2024-04-23 DIAGNOSIS — I5032 Chronic diastolic (congestive) heart failure: Secondary | ICD-10-CM
